# Patient Record
Sex: Female | Born: 1955 | Race: White | Hispanic: No | Marital: Single | Smoking: Former smoker
Health system: Southern US, Community
[De-identification: ages and names within clinical notes are randomized; demographics above are authoritative.]

## PROBLEM LIST (undated history)

## (undated) DIAGNOSIS — J189 Pneumonia, unspecified organism: Secondary | ICD-10-CM

## (undated) DIAGNOSIS — R413 Other amnesia: Secondary | ICD-10-CM

## (undated) DIAGNOSIS — Z8489 Family history of other specified conditions: Secondary | ICD-10-CM

## (undated) DIAGNOSIS — G709 Myoneural disorder, unspecified: Secondary | ICD-10-CM

## (undated) DIAGNOSIS — R030 Elevated blood-pressure reading, without diagnosis of hypertension: Secondary | ICD-10-CM

## (undated) DIAGNOSIS — H5000 Unspecified esotropia: Secondary | ICD-10-CM

## (undated) DIAGNOSIS — T7840XA Allergy, unspecified, initial encounter: Secondary | ICD-10-CM

## (undated) DIAGNOSIS — I7300 Raynaud's syndrome without gangrene: Secondary | ICD-10-CM

## (undated) DIAGNOSIS — R894 Abnormal immunological findings in specimens from other organs, systems and tissues: Secondary | ICD-10-CM

## (undated) DIAGNOSIS — G510 Bell's palsy: Secondary | ICD-10-CM

## (undated) DIAGNOSIS — E785 Hyperlipidemia, unspecified: Secondary | ICD-10-CM

## (undated) DIAGNOSIS — D649 Anemia, unspecified: Secondary | ICD-10-CM

## (undated) DIAGNOSIS — R011 Cardiac murmur, unspecified: Secondary | ICD-10-CM

## (undated) DIAGNOSIS — L4050 Arthropathic psoriasis, unspecified: Secondary | ICD-10-CM

## (undated) DIAGNOSIS — H269 Unspecified cataract: Secondary | ICD-10-CM

## (undated) DIAGNOSIS — M352 Behcet's disease: Secondary | ICD-10-CM

## (undated) DIAGNOSIS — L940 Localized scleroderma [morphea]: Secondary | ICD-10-CM

## (undated) DIAGNOSIS — K219 Gastro-esophageal reflux disease without esophagitis: Secondary | ICD-10-CM

## (undated) DIAGNOSIS — M350C Sjogren syndrome with dental involvement: Secondary | ICD-10-CM

## (undated) DIAGNOSIS — H532 Diplopia: Secondary | ICD-10-CM

## (undated) DIAGNOSIS — M810 Age-related osteoporosis without current pathological fracture: Secondary | ICD-10-CM

## (undated) DIAGNOSIS — I10 Essential (primary) hypertension: Secondary | ICD-10-CM

## (undated) DIAGNOSIS — Z1589 Genetic susceptibility to other disease: Secondary | ICD-10-CM

## (undated) DIAGNOSIS — M797 Fibromyalgia: Secondary | ICD-10-CM

## (undated) DIAGNOSIS — M25569 Pain in unspecified knee: Secondary | ICD-10-CM

## (undated) DIAGNOSIS — F419 Anxiety disorder, unspecified: Secondary | ICD-10-CM

## (undated) DIAGNOSIS — J45909 Unspecified asthma, uncomplicated: Secondary | ICD-10-CM

## (undated) DIAGNOSIS — M25559 Pain in unspecified hip: Secondary | ICD-10-CM

## (undated) DIAGNOSIS — F32A Depression, unspecified: Secondary | ICD-10-CM

## (undated) HISTORY — DX: Anemia, unspecified: D64.9

## (undated) HISTORY — DX: Bell's palsy: G51.0

## (undated) HISTORY — DX: Age-related osteoporosis without current pathological fracture: M81.0

## (undated) HISTORY — DX: Depression, unspecified: F32.A

## (undated) HISTORY — DX: Localized scleroderma (morphea): L94.0

## (undated) HISTORY — DX: Arthropathic psoriasis, unspecified: L40.50

## (undated) HISTORY — DX: Other amnesia: R41.3

## (undated) HISTORY — DX: Behcet's disease: M35.2

## (undated) HISTORY — DX: Raynaud's syndrome without gangrene: I73.00

## (undated) HISTORY — DX: Pain in unspecified knee: M25.569

## (undated) HISTORY — DX: Anxiety disorder, unspecified: F41.9

## (undated) HISTORY — DX: Unspecified esotropia: H50.00

## (undated) HISTORY — DX: Diplopia: H53.2

## (undated) HISTORY — DX: Abnormal immunological findings in specimens from other organs, systems and tissues: R89.4

## (undated) HISTORY — DX: Sjogren syndrome with dental involvement: M35.0C

## (undated) HISTORY — DX: Elevated blood-pressure reading, without diagnosis of hypertension: R03.0

## (undated) HISTORY — DX: Unspecified cataract: H26.9

## (undated) HISTORY — DX: Allergy, unspecified, initial encounter: T78.40XA

## (undated) HISTORY — DX: Unspecified asthma, uncomplicated: J45.909

## (undated) HISTORY — DX: Fibromyalgia: M79.7

## (undated) HISTORY — DX: Myoneural disorder, unspecified: G70.9

## (undated) HISTORY — DX: Genetic susceptibility to other disease: Z15.89

## (undated) HISTORY — DX: Hyperlipidemia, unspecified: E78.5

## (undated) HISTORY — PX: OTHER SURGICAL HISTORY: SHX169

## (undated) HISTORY — DX: Pain in unspecified hip: M25.559

## (undated) HISTORY — DX: Gastro-esophageal reflux disease without esophagitis: K21.9

## (undated) HISTORY — PX: CYST REMOVAL HAND: SHX6279

---

## 1978-01-11 HISTORY — PX: SALPINGECTOMY: SHX328

## 1989-01-11 DIAGNOSIS — E0500 Thyrotoxicosis with diffuse goiter without thyrotoxic crisis or storm: Secondary | ICD-10-CM

## 1989-01-11 HISTORY — DX: Thyrotoxicosis with diffuse goiter without thyrotoxic crisis or storm: E05.00

## 2019-12-19 ENCOUNTER — Encounter: Payer: Self-pay | Admitting: Family Medicine

## 2020-01-01 ENCOUNTER — Encounter: Payer: Self-pay | Admitting: Family Medicine

## 2020-01-01 ENCOUNTER — Ambulatory Visit (INDEPENDENT_AMBULATORY_CARE_PROVIDER_SITE_OTHER): Payer: 59 | Admitting: Family Medicine

## 2020-01-01 ENCOUNTER — Ambulatory Visit: Payer: Self-pay | Admitting: Family Medicine

## 2020-01-01 ENCOUNTER — Other Ambulatory Visit: Payer: Self-pay

## 2020-01-01 VITALS — BP 144/92 | HR 104 | Ht 65.0 in | Wt 175.6 lb

## 2020-01-01 DIAGNOSIS — Z1211 Encounter for screening for malignant neoplasm of colon: Secondary | ICD-10-CM

## 2020-01-01 DIAGNOSIS — E890 Postprocedural hypothyroidism: Secondary | ICD-10-CM

## 2020-01-01 DIAGNOSIS — M3500 Sicca syndrome, unspecified: Secondary | ICD-10-CM

## 2020-01-01 DIAGNOSIS — Z1159 Encounter for screening for other viral diseases: Secondary | ICD-10-CM

## 2020-01-01 DIAGNOSIS — R030 Elevated blood-pressure reading, without diagnosis of hypertension: Secondary | ICD-10-CM

## 2020-01-01 DIAGNOSIS — Z1589 Genetic susceptibility to other disease: Secondary | ICD-10-CM

## 2020-01-01 DIAGNOSIS — Z1231 Encounter for screening mammogram for malignant neoplasm of breast: Secondary | ICD-10-CM

## 2020-01-01 DIAGNOSIS — E0500 Thyrotoxicosis with diffuse goiter without thyrotoxic crisis or storm: Secondary | ICD-10-CM

## 2020-01-01 DIAGNOSIS — R197 Diarrhea, unspecified: Secondary | ICD-10-CM

## 2020-01-01 DIAGNOSIS — E039 Hypothyroidism, unspecified: Secondary | ICD-10-CM

## 2020-01-01 DIAGNOSIS — Z6829 Body mass index (BMI) 29.0-29.9, adult: Secondary | ICD-10-CM

## 2020-01-01 DIAGNOSIS — Z0000 Encounter for general adult medical examination without abnormal findings: Secondary | ICD-10-CM

## 2020-01-01 DIAGNOSIS — R4189 Other symptoms and signs involving cognitive functions and awareness: Secondary | ICD-10-CM

## 2020-01-01 MED ORDER — RESTASIS 0.05 % OP EMUL: 1.0000 [drp] | Freq: Two times a day (BID) | OPHTHALMIC | 3 refills | Status: DC

## 2020-01-01 MED ORDER — DULOXETINE HCL 30 MG PO CPEP: 30.0000 mg | ORAL_CAPSULE | Freq: Every day | ORAL | 0 refills | Status: DC

## 2020-01-01 NOTE — Patient Instructions (Addendum)
It was a pleasure to see you today!  Thank you for choosing [facility] for your primary care.  Jodi Gilmore was seen for establishing care, autoimmune diseases, Graves' disease, diarrhea) brain fog.   Our plans for today were:  In order to further assess your brain fog and thyroid concerns, we will measure your TSH and free T4.  I will also complete a metabolic panel as well as a CBC to investigate for any potential forms of anemia or signs of infection, this could also show any potential changes in your ability to absorb nutrients and therefore clues as to what may be causing your diarrhea.   I will contact you with any abnormal lab values to discuss further treatment.  I have submitted referrals for rheumatology (to discuss your rheumatologic conditions), ophthalmology (for eye exam)and gastroenterology to set up your colonoscopy/assess your abdominal issues.  We can discuss setting up referral for endocrinology at your next appointment in 2 weeks to allow for these referrals to start the process.  To keep you healthy, please keep in mind the following health maintenance items that you are due for:    1. Tetanus vaccination booster 2. Pap smear   You should return to our clinic in 2 weeks for Pap smear and follow-up on joint pain as well as check cholesterol.   Best Wishes,   Dr. Neita Garnet   Psychiatry Resource List (Adults and Children) Most of these providers will take Medicaid. please consult your insurance for a complete and updated list of available providers. When calling to make an appointment have your insurance information available to confirm you are covered.   [facility]:Psychiatry and Counseling [address] [place], [place] [address] [phone]  [facility]  [address] [place], [place] [phone] Crisis [phone]   [facility]:   [facility]: [address]      [phone]   [facility] Ace: [address] [place]. [place],        [phone] [place]: [address],    [phone] Kathryne Sharper: [address],                   [phone] Children: [facility] [address]         [phone]   [facility]  (Psychiatry only; Adults /children 12 and over, will take Medicaid)  [address] Laurell Josephs [address], [place], [place] [address]       [phone]   [facility] (Psychiatry & counseling ; adults & children ; will take Medicaid [address]  [place] [address]   Go on-line to complete referral ( [email or web address].[email or web address]/en/make-[email or web address] [phone]   (Spanish therapist)  [facility]  Psychiatry & counseling; Adults and children;  Call Registration prior to scheduling an appointment [phone] [address]    [place], [place] [address]    [phone]  [facility] (Psychiatry & counseling; adults & children; Medicare no Medicaid)  [address]   [place], [place]  [address]      [phone]    [facility] (up to age 3)  Psychiatry & counseling ,will take Medicaid, must do counseling to receive psychiatry services  [address] [place] [address]        [phone]  [facility] (Psychiatry & counseling; adults & children; will take Medicaid)  Will need a referral from provider [address],  [place], [place]  [phone]   [facility] --- Walk-In Mon-Friday 8am-3pm ( will take Medicaid, Psychiatry, Adults & children,  [address], [place], [place]   [phone]   [facility], Walk-in M-F 8am-12pm and 1pm -3pm   (Counseling, Psychiatry, will take Medicaid, adults & children)  [address] [place], [place]  [phone]

## 2020-01-01 NOTE — Progress Notes (Addendum)
SUBJECTIVE:   CHIEF COMPLAINT / HPI: establishing care   Friend Jessica present for visit in order to help patient take notes  History of Graves' disease,s/p RAI  Patient reports that she underwent RAI in 1991.  She has since been on Synthroid and has required frequent adjusting of her dosage due to her thyroid frequently being "out of whack".  She reports the she has been experiencing fluctuations in her blood pressure which is normally the case on her thyroid is not under control.  Patient states that she recently moved from [place] and has not established care since being here in [place] until today.  She reports adherence with her thyroid medication.  Patient request to have full "thyroid panel".   Abdominal Discomfort  Patient reports having moderate abdominal pain associated with up to 20 episodes of loose stools per day. She satates that she has not had any fever or known contacts with similar symtpoms. She reports having some episodes of blood in her bowel movemtns that has ranged from scant amount to moderate amount mixed in with feces. She has not had a colonsocopy in decades. She reports hx of GERD.   Brain Fog Patient reports experiencing brain fog and having difficulty with word finding and remembering things. She reports that her sister and mother both had early onset dementia and her sister is currently on a memory unit for alzheimer's. She is concerned if she may be in the early stages and would also like to see if it is connected to her thyroid disease.   Past medical history Patient reports history of Graves' disease with RAI in 1991 History of Sjogren's with sicca syndrome Scleroderma Psoriatic arthritis Raynaud's HLA-B27 gene  Healthcare maintenance Patient is due for colonoscopy, has not had one in 30 years Patient due for mammogram has not had one in 20 years Has not had Pap smear in 20+ years Has not had bone density scan in 20 years Unknown tetanus  vaccination Head potential pneumonia vaccine in the late 1990s Has not had shingles vaccine   Remainder of items patient wishes to discuss at future appointments: Hip and knee pain Vision problems, difficulty driving Concern for additional rheumatologic diagnoses Bipolar Disorder  PERTINENT  PMH / PSH:  Patient is out of work and recently lost parents within 3 months of one another  Recently moved here from [facility]    OBJECTIVE:   BP (!) 144/92   Pulse (!) 104   Ht _0  (1.651 m)   Wt 175 lb 9.6 oz (79.7 kg)   SpO2 95%   BMI 29.22 kg/m   General: female appearing stated age in no acute distress HEENT: MMM, no oral lesions noted,Neck non-tender without lymphadenopathy, patient is status post RAI Cardio: Normal S1 and S2, no S3 or S4. Rhythm is regular. No murmurs or rubs.  Bilateral radial pulses palpable Pulm: Clear to auscultation bilaterally, no crackles, wheezing, or diminished breath sounds. Normal respiratory effort, stable on room air Abdomen: Bowel sounds normal. Abdomen soft with tenderness Extremities: Trace peripheral edema. Warm/ well perfused.  Neuro: pt alert and oriented x4, extraocular muscles intact, 5/5 strength in bilateral upper and lower extremities  ASSESSMENT/PLAN:   Hypothyroidism (acquired) Patient has undergone RAI and has since been on Synthroid since 1991. Free T4, T3, TSH collected today We will adjust Synthroid dose based on results Discussed referral to endocrinology once results and patient has had time to respond to change dose of Synthroid  Diarrhea in adult patient  Given nature of diarrhea and history of bloody bowel movements, patient referred to gastroenterology for colonoscopy as this has not been done in 30 years. List likely on differential is infectious etiology given the time course, patient could also have some form of malabsorption contributing to symptoms given her extensive hx of comorbidities. Patient also reports taking several  NSAIDS and Tylenol  Pills on regular baseis so some concern that she may have peptic ulcer disease. - ordered GI referral for colonoscopy  - CMP, CBC  To check for dehydration or any signs of inflammation/infection.  - check Hgb A1c for any DM related complication if present  - prescribed cymbalta if case patient has underlying IBS as contributor to symptoms   Brain fog Most likely related to thyroid abnormalities given hx. Patient may also be showing signs of memory iloss gven her family hx of early onset dementia. Will establish baseline for lab values as mentioned above. Patient would benefit from mental status assessment at follow up appts   Healthcare maintenance GI referral for colonoscopy  Referred to ophthalmology for vision changes  Rheum referral per patien t pref given hx of several rheum disease, will need patient medical records for referral process mammogram ordered  Patient will follow up for pap smear  Hep c screening completed today         Eulis Foster, MD [place]

## 2020-01-02 ENCOUNTER — Other Ambulatory Visit: Payer: Self-pay | Admitting: Family Medicine

## 2020-01-02 LAB — COMPREHENSIVE METABOLIC PANEL
ALT: 42 IU/L — ABNORMAL HIGH (ref 0–32)
AST: 33 IU/L (ref 0–40)
Albumin/Globulin Ratio: 1.9 (ref 1.2–2.2)
Albumin: 4.9 g/dL — ABNORMAL HIGH (ref 3.8–4.8)
Alkaline Phosphatase: 128 IU/L — ABNORMAL HIGH (ref 44–121)
BUN/Creatinine Ratio: 17 (ref 12–28)
BUN: 17 mg/dL (ref 8–27)
Bilirubin Total: 0.2 mg/dL (ref 0.0–1.2)
CO2: 22 mmol/L (ref 20–29)
Calcium: 10.2 mg/dL (ref 8.7–10.3)
Chloride: 98 mmol/L (ref 96–106)
Creatinine, Ser: 1 mg/dL (ref 0.57–1.00)
GFR calc Af Amer: 69 mL/min/{1.73_m2} (ref >59)
GFR calc non Af Amer: 60 mL/min/{1.73_m2} (ref >59)
Globulin, Total: 2.6 g/dL (ref 1.5–4.5)
Glucose: 87 mg/dL (ref 65–99)
Potassium: 4.2 mmol/L (ref 3.5–5.2)
Sodium: 138 mmol/L (ref 134–144)
Total Protein: 7.5 g/dL (ref 6.0–8.5)

## 2020-01-02 LAB — CBC WITH DIFFERENTIAL/PLATELET
Basophils Absolute: 0.1 10*3/uL (ref 0.0–0.2)
Basos: 1 %
EOS (ABSOLUTE): 0.3 10*3/uL (ref 0.0–0.4)
Eos: 4 %
Hematocrit: 42.3 % (ref 34.0–46.6)
Hemoglobin: 14.4 g/dL (ref 11.1–15.9)
Immature Grans (Abs): 0 10*3/uL (ref 0.0–0.1)
Immature Granulocytes: 0 %
Lymphocytes Absolute: 1.8 10*3/uL (ref 0.7–3.1)
Lymphs: 18 %
MCH: 30.2 pg (ref 26.6–33.0)
MCHC: 34 g/dL (ref 31.5–35.7)
MCV: 89 fL (ref 79–97)
Monocytes Absolute: 0.7 10*3/uL (ref 0.1–0.9)
Monocytes: 7 %
Neutrophils Absolute: 6.8 10*3/uL (ref 1.4–7.0)
Neutrophils: 70 %
Platelets: 354 10*3/uL (ref 150–450)
RBC: 4.77 x10E6/uL (ref 3.77–5.28)
RDW: 12.8 % (ref 11.7–15.4)
WBC: 9.7 10*3/uL (ref 3.4–10.8)

## 2020-01-02 LAB — HEMOGLOBIN A1C
Est. average glucose Bld gHb Est-mCnc: 103 mg/dL
Hgb A1c MFr Bld: 5.2 % (ref 4.8–5.6)

## 2020-01-02 LAB — TSH: TSH: 14.3 u[IU]/mL — ABNORMAL HIGH (ref 0.450–4.500)

## 2020-01-02 LAB — HEPATITIS C ANTIBODY: Hep C Virus Ab: <0.1 s/co ratio (ref 0.0–0.9)

## 2020-01-02 LAB — VITAMIN B12: Vitamin B-12: >2000 pg/mL — ABNORMAL HIGH (ref 232–1245)

## 2020-01-02 LAB — T3, FREE: T3, Free: 2.3 pg/mL (ref 2.0–4.4)

## 2020-01-02 LAB — T4, FREE: Free T4: 1.26 ng/dL (ref 0.82–1.77)

## 2020-01-02 LAB — HIV ANTIBODY (ROUTINE TESTING W REFLEX): HIV Screen 4th Generation wRfx: NONREACTIVE

## 2020-01-02 MED ORDER — LEVOTHYROXINE SODIUM 137 MCG PO TABS: 125.0000 ug | ORAL_TABLET | Freq: Every day | ORAL | 0 refills | Status: DC

## 2020-01-02 NOTE — Progress Notes (Signed)
Increased synthroid dose to daily from 

## 2020-01-03 ENCOUNTER — Telehealth: Payer: Self-pay

## 2020-01-03 ENCOUNTER — Emergency Department (HOSPITAL_COMMUNITY): Payer: 59

## 2020-01-03 ENCOUNTER — Ambulatory Visit (HOSPITAL_COMMUNITY)
Admission: EM | Admit: 2020-01-03 | Discharge: 2020-01-03 | Disposition: A | Discharge status: Home / Self Care | Payer: 59

## 2020-01-03 ENCOUNTER — Emergency Department (HOSPITAL_COMMUNITY)
Admission: EM | Admit: 2020-01-03 | Discharge: 2020-01-04 | Disposition: A | Discharge status: Home / Self Care | Payer: 59 | Attending: Emergency Medicine | Admitting: Emergency Medicine

## 2020-01-03 ENCOUNTER — Encounter (HOSPITAL_COMMUNITY): Payer: Self-pay

## 2020-01-03 ENCOUNTER — Other Ambulatory Visit: Payer: Self-pay

## 2020-01-03 DIAGNOSIS — Z91040 Latex allergy status: Secondary | ICD-10-CM | POA: Insufficient documentation

## 2020-01-03 DIAGNOSIS — R000 Tachycardia, unspecified: Secondary | ICD-10-CM | POA: Diagnosis not present

## 2020-01-03 DIAGNOSIS — R519 Headache, unspecified: Secondary | ICD-10-CM | POA: Diagnosis not present

## 2020-01-03 DIAGNOSIS — R0789 Other chest pain: Secondary | ICD-10-CM | POA: Insufficient documentation

## 2020-01-03 DIAGNOSIS — R079 Chest pain, unspecified: Secondary | ICD-10-CM

## 2020-01-03 DIAGNOSIS — Z0000 Encounter for general adult medical examination without abnormal findings: Secondary | ICD-10-CM | POA: Insufficient documentation

## 2020-01-03 DIAGNOSIS — R197 Diarrhea, unspecified: Secondary | ICD-10-CM | POA: Insufficient documentation

## 2020-01-03 DIAGNOSIS — I10 Essential (primary) hypertension: Secondary | ICD-10-CM | POA: Diagnosis not present

## 2020-01-03 DIAGNOSIS — Z87891 Personal history of nicotine dependence: Secondary | ICD-10-CM | POA: Diagnosis not present

## 2020-01-03 DIAGNOSIS — R42 Dizziness and giddiness: Secondary | ICD-10-CM | POA: Insufficient documentation

## 2020-01-03 DIAGNOSIS — E039 Hypothyroidism, unspecified: Secondary | ICD-10-CM | POA: Insufficient documentation

## 2020-01-03 DIAGNOSIS — R4189 Other symptoms and signs involving cognitive functions and awareness: Secondary | ICD-10-CM | POA: Insufficient documentation

## 2020-01-03 LAB — TROPONIN I (HIGH SENSITIVITY)
Troponin I (High Sensitivity): 5 ng/L (ref <18)
Troponin I (High Sensitivity): 5 ng/L (ref <18)

## 2020-01-03 LAB — CBC
HCT: 40.3 % (ref 36.0–46.0)
Hemoglobin: 13.9 g/dL (ref 12.0–15.0)
MCH: 30.8 pg (ref 26.0–34.0)
MCHC: 34.5 g/dL (ref 30.0–36.0)
MCV: 89.2 fL (ref 80.0–100.0)
Platelets: 363 10*3/uL (ref 150–400)
RBC: 4.52 MIL/uL (ref 3.87–5.11)
RDW: 12.5 % (ref 11.5–15.5)
WBC: 7.6 10*3/uL (ref 4.0–10.5)
nRBC: 0 % (ref 0.0–0.2)

## 2020-01-03 LAB — BASIC METABOLIC PANEL
Anion gap: 12 (ref 5–15)
BUN: 11 mg/dL (ref 8–23)
CO2: 24 mmol/L (ref 22–32)
Calcium: 10.1 mg/dL (ref 8.9–10.3)
Chloride: 100 mmol/L (ref 98–111)
Creatinine, Ser: 0.8 mg/dL (ref 0.44–1.00)
GFR, Estimated: >60 mL/min (ref >60)
Glucose, Bld: 115 mg/dL — ABNORMAL HIGH (ref 70–99)
Potassium: 3.9 mmol/L (ref 3.5–5.1)
Sodium: 136 mmol/L (ref 135–145)

## 2020-01-03 IMAGING — DX DG CHEST 2V
2 series · 2 of 2 positions shown · non-contrast
Comparison: None.

CLINICAL DATA: Shortness of breath.

EXAM:
CHEST - 2 VIEW

[chest pa]
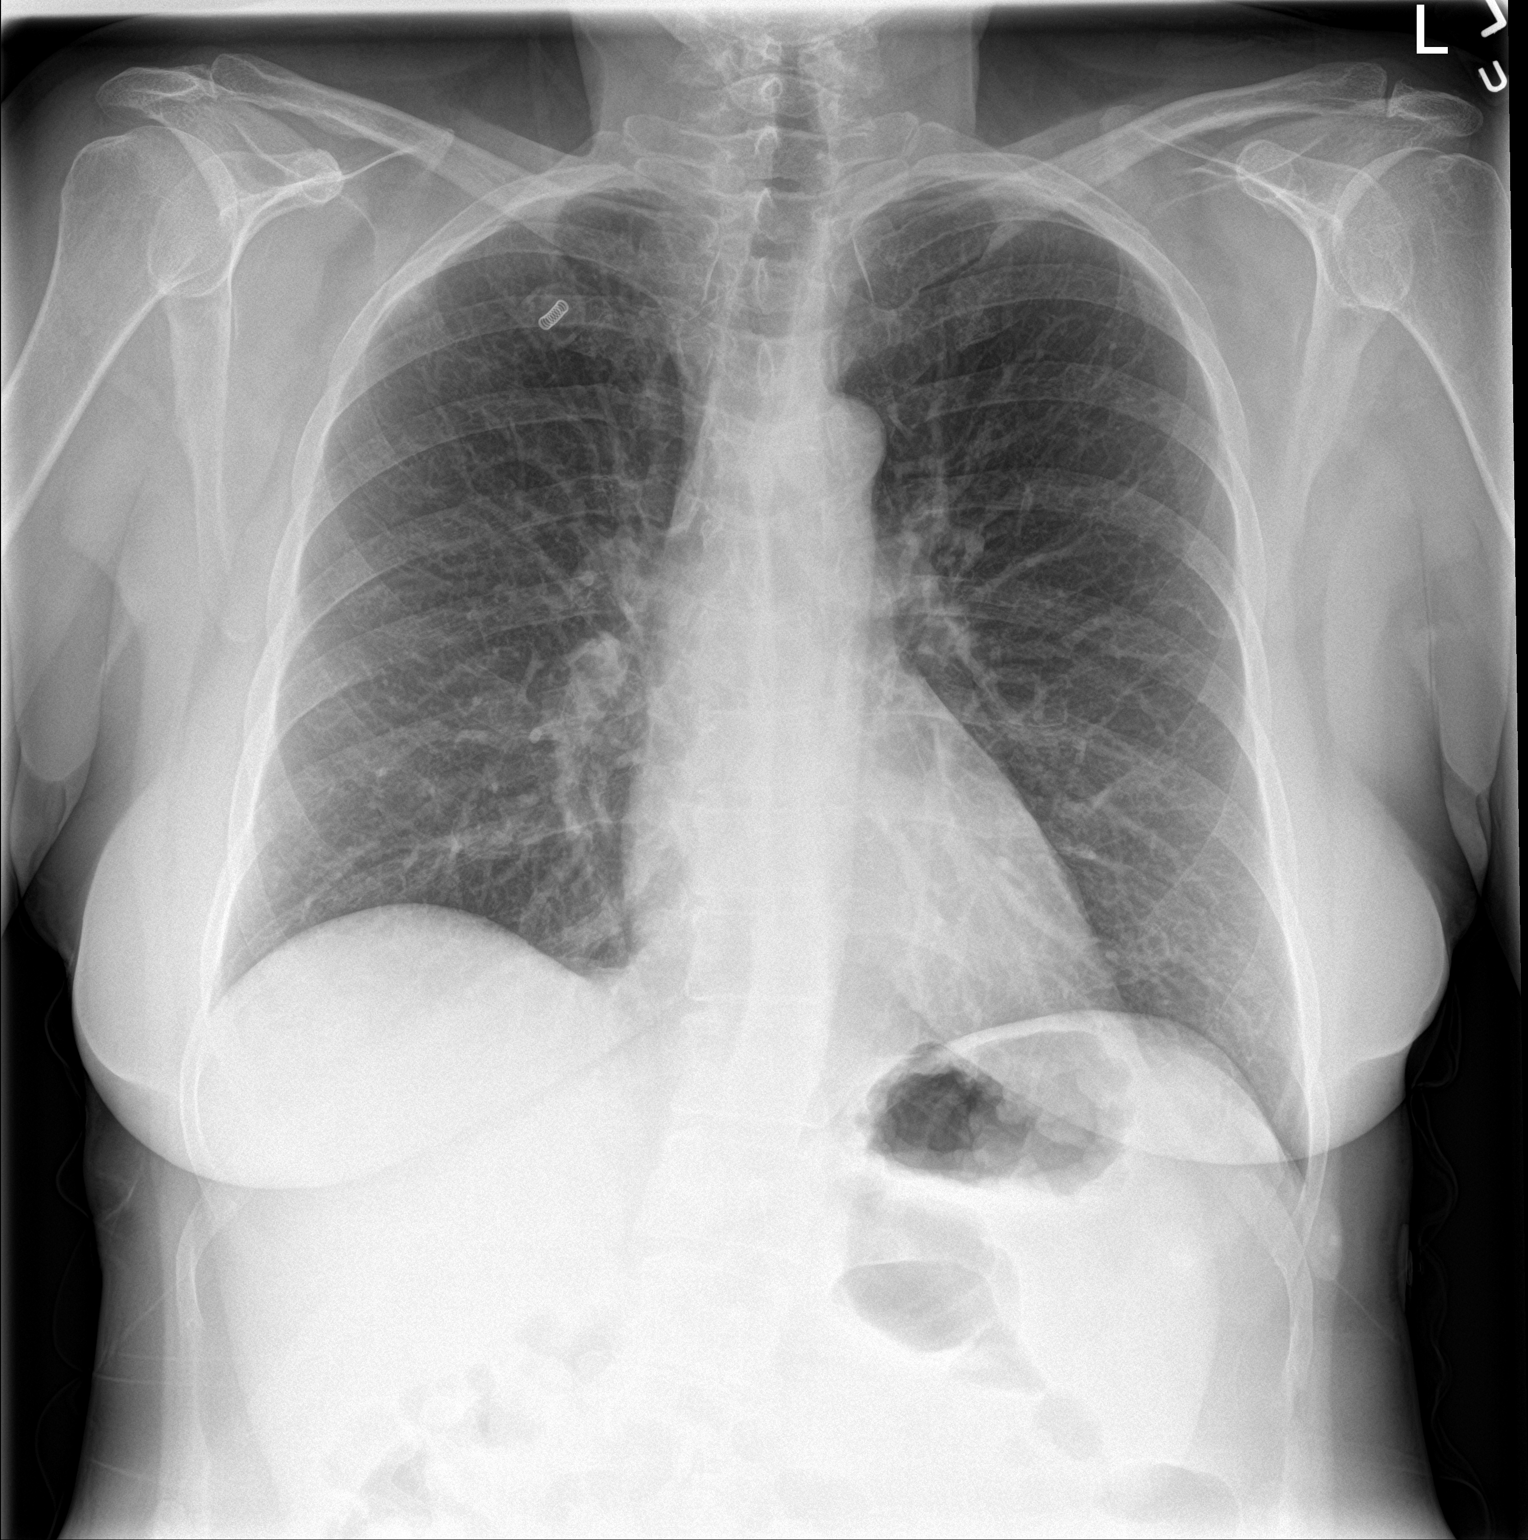

[chest lat]
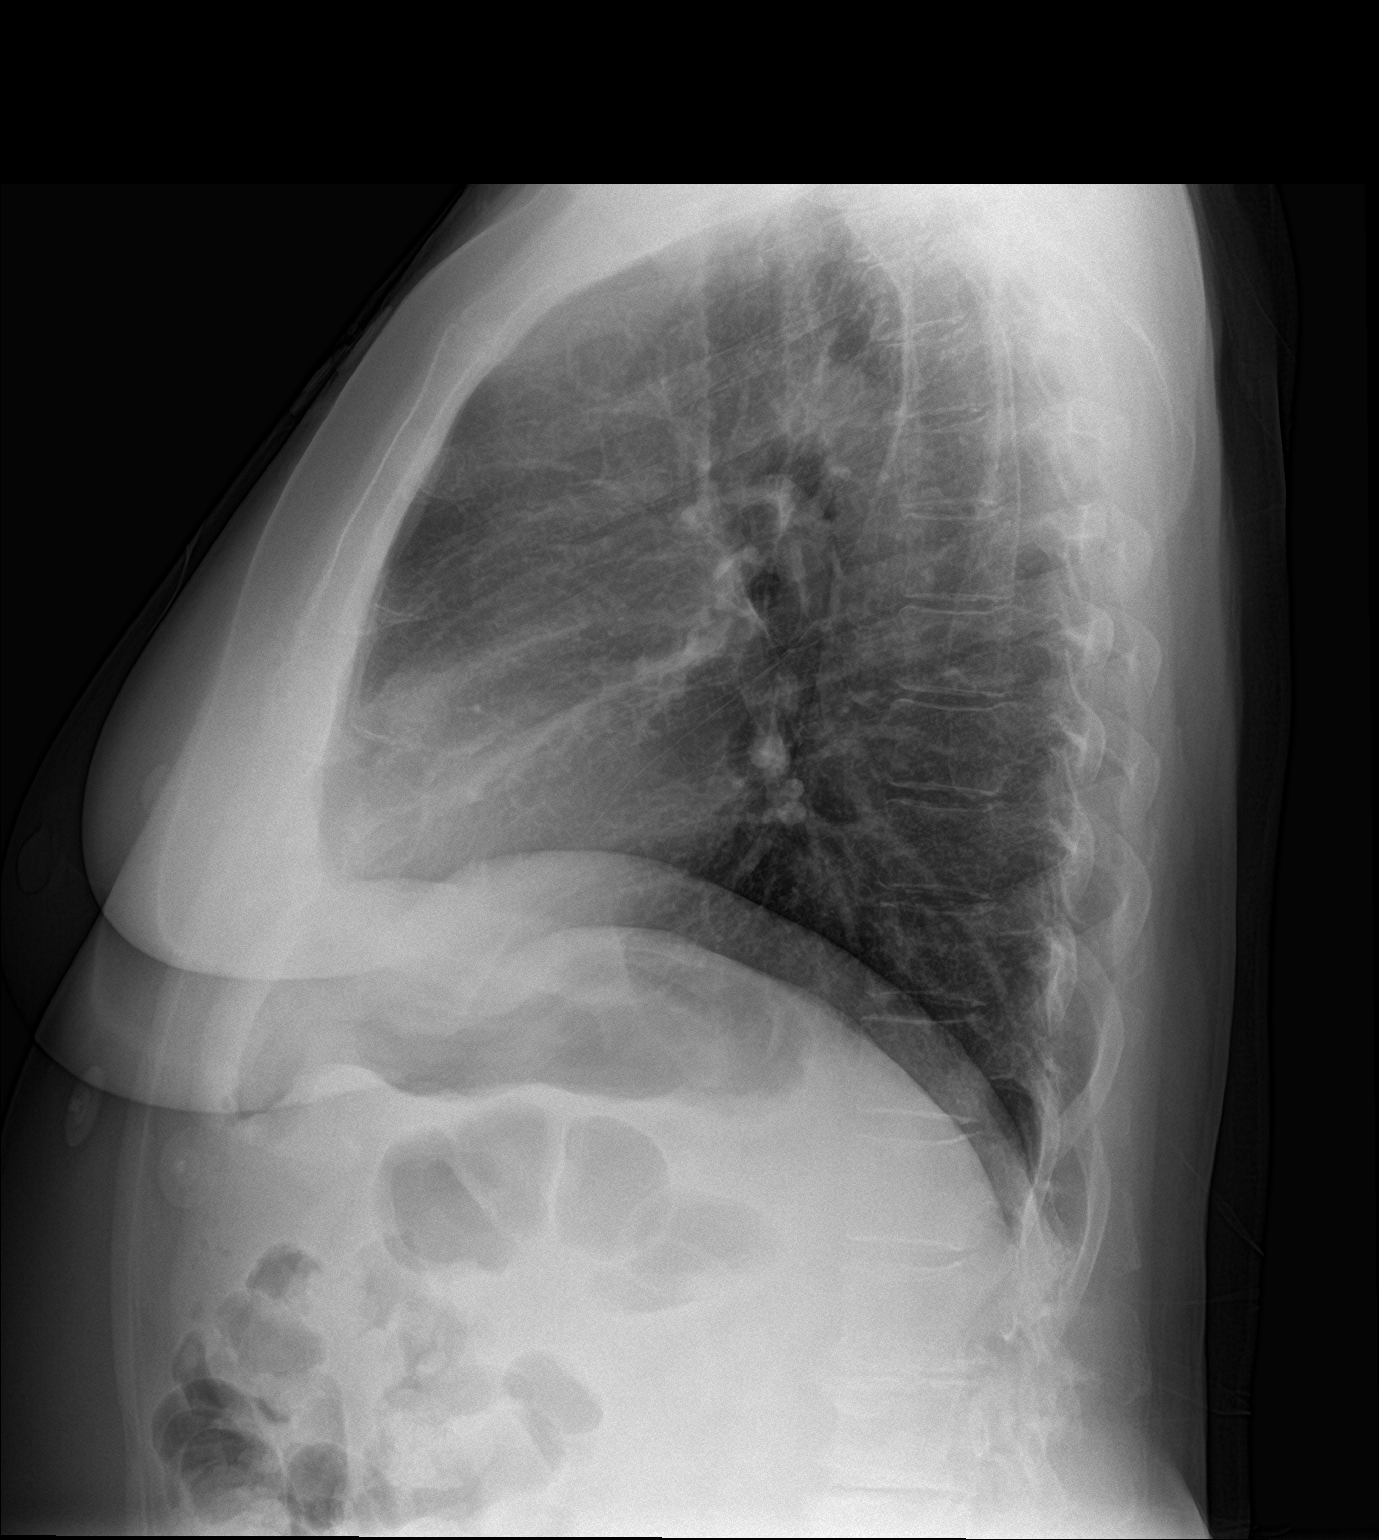

[2 of 2 positions shown; findings below may reference images not displayed]

FINDINGS: The heart size and mediastinal contours are within normal limits.
The a small radiopaque stent is seen overlying the right apex on the
frontal view. Both lungs are clear. The visualized skeletal
structures are unremarkable.
IMPRESSION: No active cardiopulmonary disease.

## 2020-01-03 IMAGING — CT CT HEAD W/O CM
4 series · 15 of 47 positions shown, 17 images · non-contrast
Comparison: None.

CLINICAL DATA: Worsening headache.

EXAM:
CT HEAD WITHOUT CONTRAST
TECHNIQUE: Contiguous axial images were obtained from the base of the skull
through the vertex without intravenous contrast.

[Series 3: head without · axial · non-contrast · 0.46mm/px · z∈[-136,-16]mm · 6 of 34 slices shown, 8 images]
[im 5/34  brain]
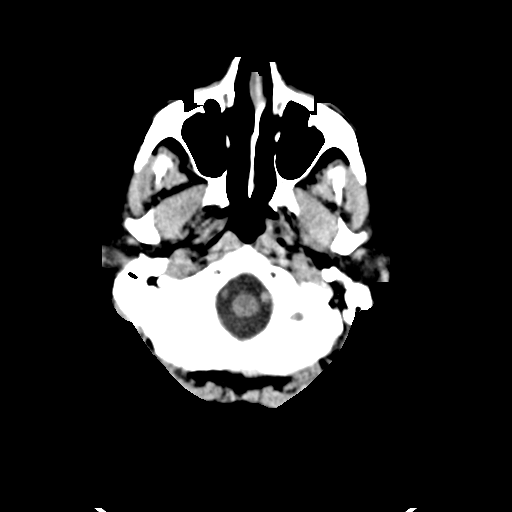
[im 5/34  bone]
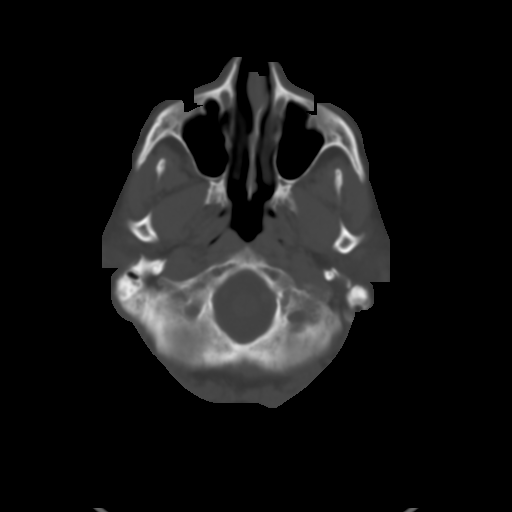
[im 10/34  brain]
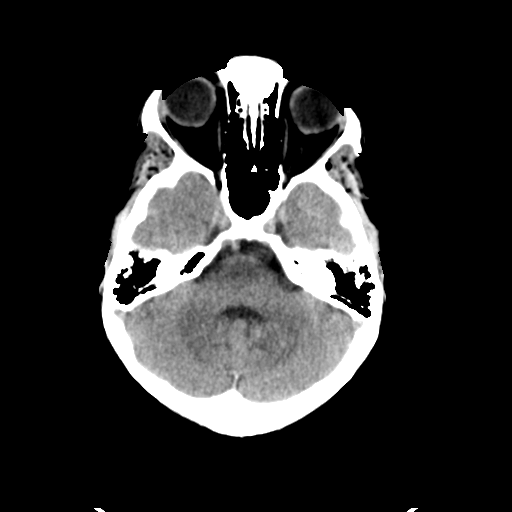
[im 15/34  brain]
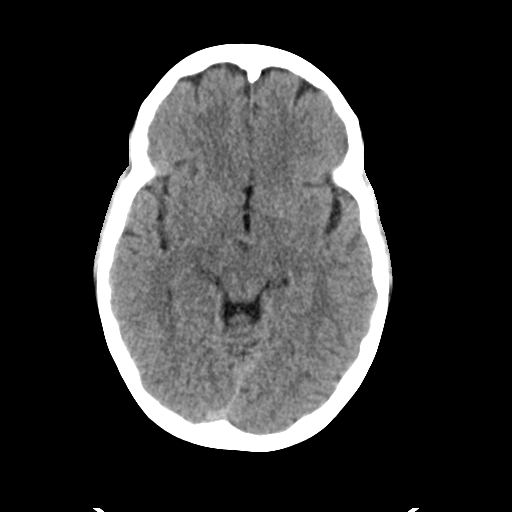
[im 19/34  brain]
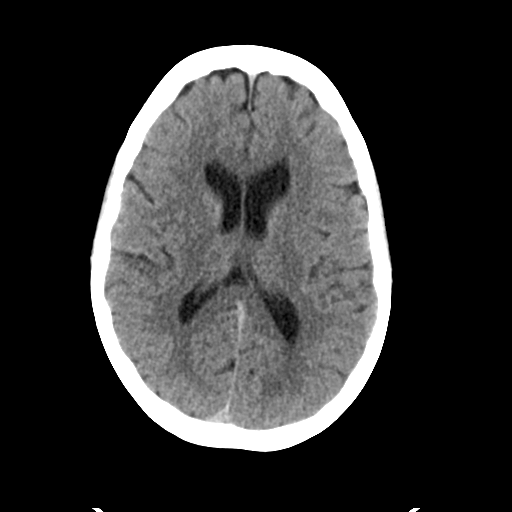
[im 24/34  brain]
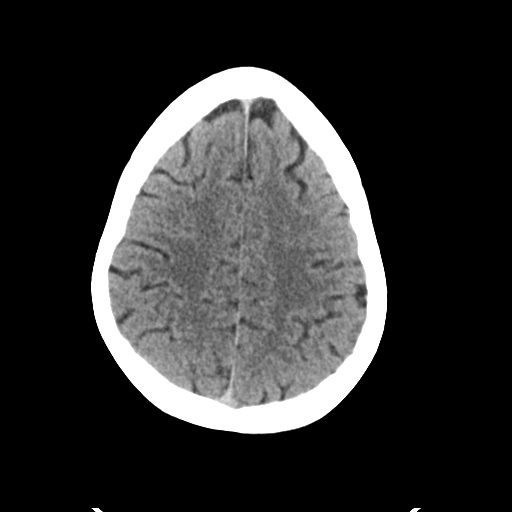
[im 24/34  bone]
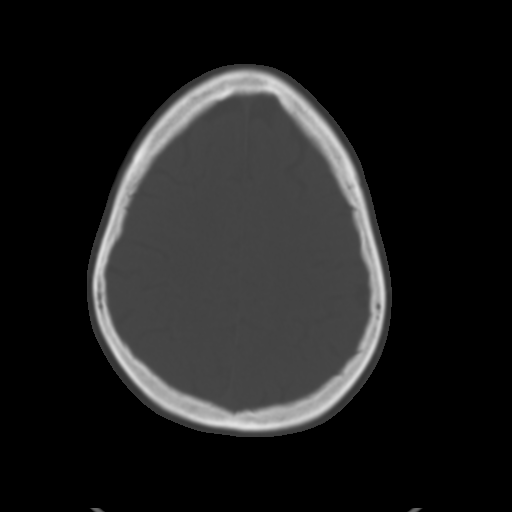
[im 29/34  brain]
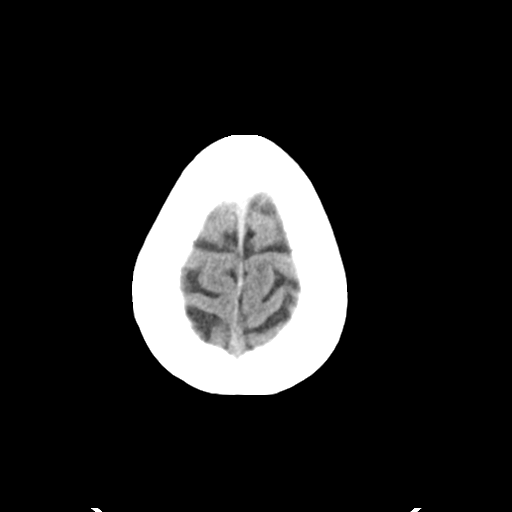

[Series 4: head bone · axial · 0.46mm/px · z∈[-140,-100]mm · 3 of 86 slices shown]
[im 9/86  bone]
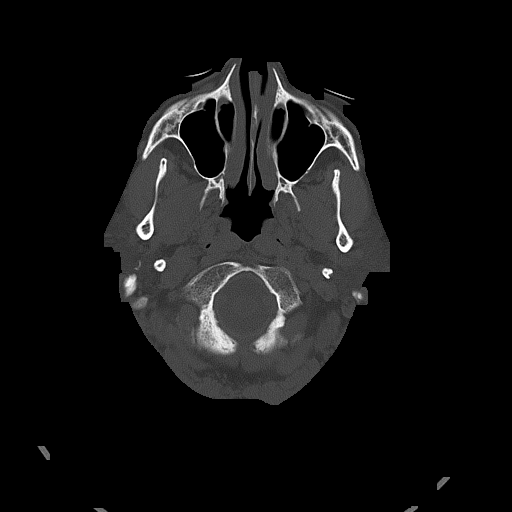
[im 17/86  bone]
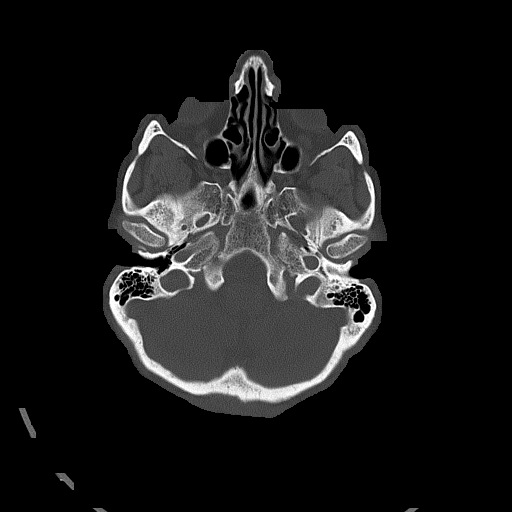
[im 29/86  bone]
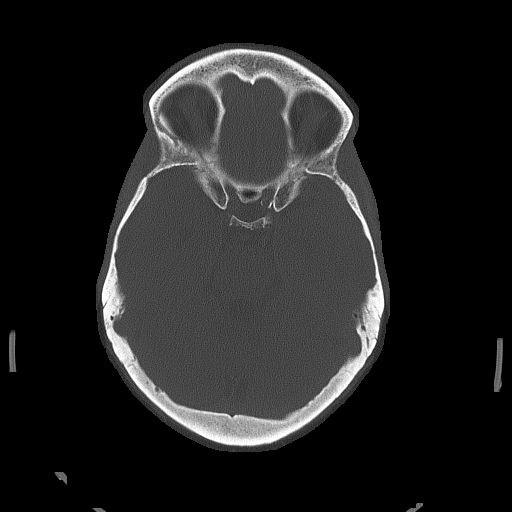

[Series 5: head without cor · coronal · non-contrast · 0.33mm/px · 3 of 71 slices shown]
[im 24/71  brain]
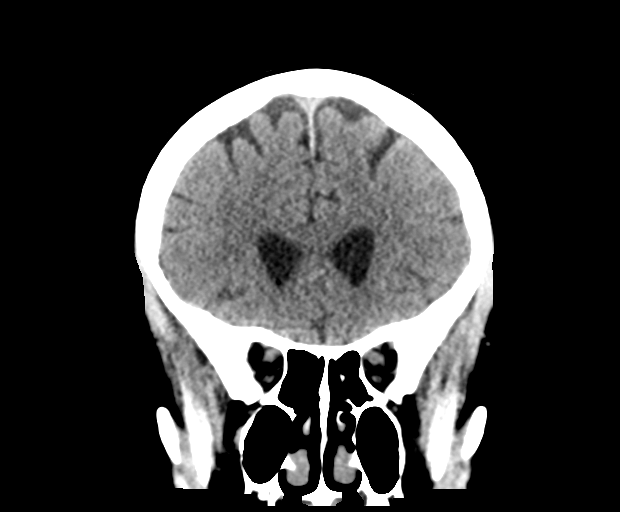
[im 32/71  brain]
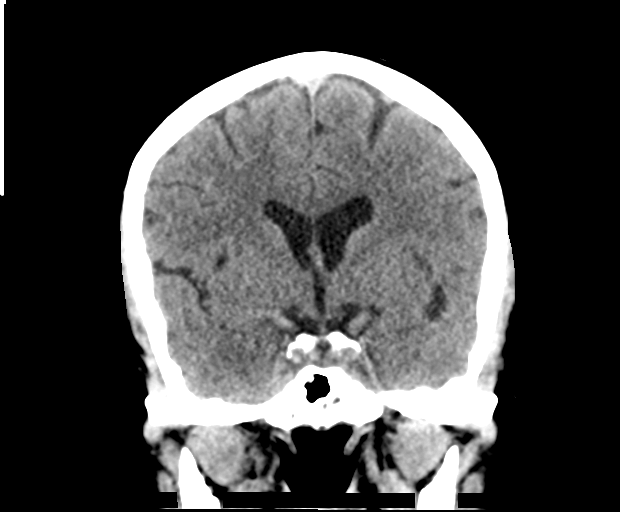
[im 39/71  brain]
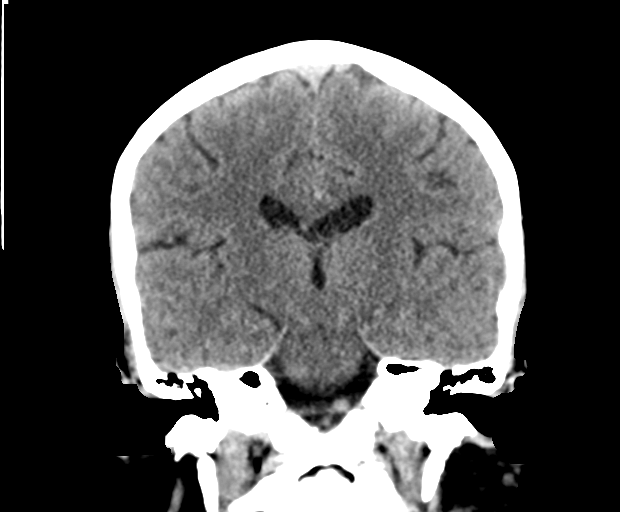

[Series 6: head without sag · sagittal · non-contrast · 0.33mm/px · 3 of 67 slices shown]
[im 23/67  brain]
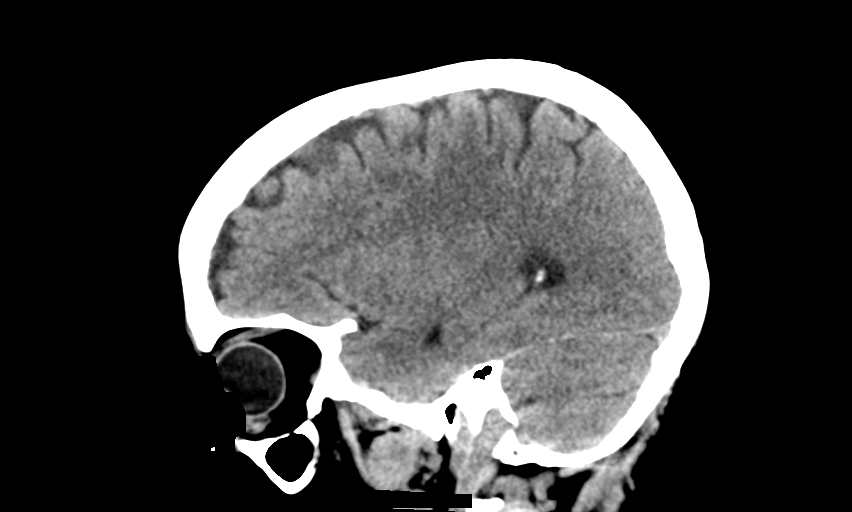
[im 34/67  brain]
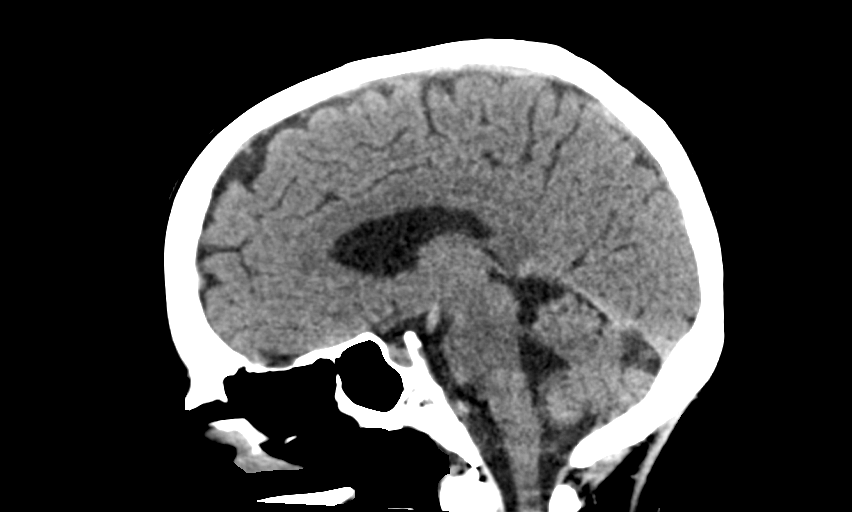
[im 45/67  brain]
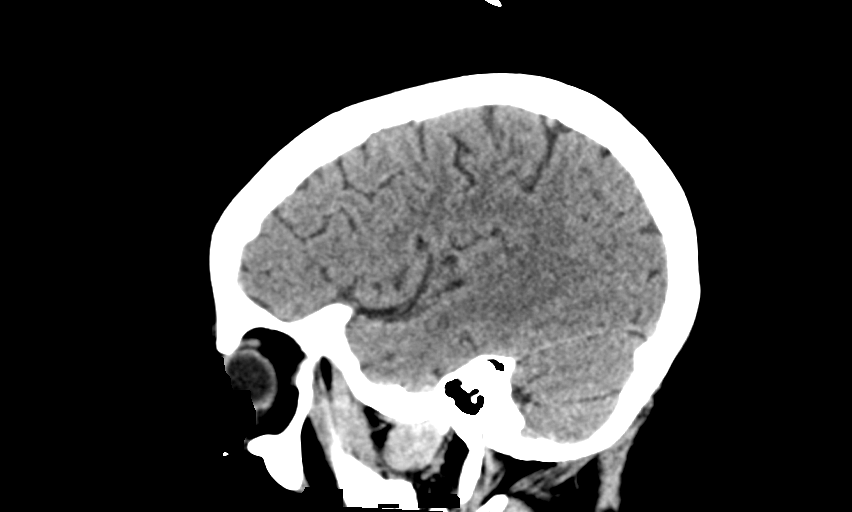

[15 of 47 positions shown; findings below may reference images not displayed]

FINDINGS: Brain: No evidence of acute infarction, hemorrhage, hydrocephalus,
extra-axial collection or mass lesion/mass effect.

Vascular: No hyperdense vessel or unexpected calcification.

Skull: Normal. Negative for fracture or focal lesion.

Sinuses/Orbits: No acute finding.

Other: None.
IMPRESSION: No acute intracranial pathology.

## 2020-01-03 MED ORDER — KETOROLAC TROMETHAMINE 30 MG/ML IJ SOLN
30.0000 mg | Freq: Once | INTRAMUSCULAR | Status: AC
  Administered 2020-01-04: 30 mg via INTRAVENOUS
  Filled 2020-01-03: qty 1

## 2020-01-03 NOTE — Assessment & Plan Note (Signed)
Given nature of diarrhea and history of bloody bowel movements, patient referred to gastroenterology for colonoscopy as this has not been done in 30 years. List likely on differential is infectious etiology given the time course, patient could also have some form of malabsorption contributing to symptoms given her extensive hx of comorbidities. Patient also reports taking several NSAIDS and Tylenol  Pills on regular baseis so some concern that she may have peptic ulcer disease. - ordered GI referral for colonoscopy  - CMP, CBC  To check for dehydration or any signs of inflammation/infection.  - check Hgb A1c for any DM related complication if present  - prescribed cymbalta if case patient has underlying IBS as contributor to symptoms

## 2020-01-03 NOTE — ED Notes (Signed)
Handoff given to Jazmine RN

## 2020-01-03 NOTE — ED Triage Notes (Addendum)
Pt In with c/o HTN. States that her BP was 169/90. Also c/o headache and dizziness. States she is seeing "spots" also c/o chest pressure  Pt took mylanta with no relief

## 2020-01-03 NOTE — ED Notes (Signed)
Pia Mau, PA into triage to assess pt status. Pt advised to be seen  In ED for further evaluation

## 2020-01-03 NOTE — Assessment & Plan Note (Signed)
Most likely related to thyroid abnormalities given hx. Patient may also be showing signs of memory iloss gven her family hx of early onset dementia. Will establish baseline for lab values as mentioned above. Patient would benefit from mental status assessment at follow up appts

## 2020-01-03 NOTE — Assessment & Plan Note (Signed)
GI referral for colonoscopy  Referred to ophthalmology for vision changes  Rheum referral per patien t pref given hx of several rheum disease, will need patient medical records for referral process mammogram ordered  Patient will follow up for pap smear  Hep c screening completed today

## 2020-01-03 NOTE — Telephone Encounter (Signed)
Patient calls nurse line requesting to speak with provider regarding possibly starting lisinopril 2.5 mg.   Started cymbalta yesterday. Reports nausea and headache. Headache onset yesterday around one. Patient does report history of acid reflux/ GERD symptoms. Patient states that she has had "chest discomfort" in the past but always goes away. Patient reports taking prilosec for these symptoms, with some relief. Patient reports that chest feels better when she is sitting up.   Pulse Ox: 95%, HR:96, BP: 168/117, 164/96  Advised patient that due to severe headache and elevated BP readings that she should be evaluated today.   Advised UC/ED follow up. Patient verbalized understanding.   Veronda Prude, RN

## 2020-01-03 NOTE — ED Provider Notes (Signed)
TIME SEEN: 11:18 PM  CHIEF COMPLAINT: Chest pain, headache, hypertension  HPI: Patient is a 64 year old female with history of Graves' disease presents to the emergency department with complaints of diffuse chest pressure, diffuse headache that she described as throbbing and hypertension.  Blood pressure was 173/114 at home.  States blood pressure was 176/88 in urgent care and they instructed her to come to the emergency department.  She told them that she was having dizziness and seeing spots.  She denies any vision changes at this time to me.  No numbness or weakness.  No fevers, cough, shortness of breath, nausea, vomiting, diaphoresis.  States she did take Sudafed today and she just started Cymbalta.  She is not on medications for her blood pressure.  She is wondering if these medications could be causing her high blood pressure.  She does have a history of chronic headaches.  No history of PE, DVT, exogenous estrogen use, recent fractures, surgery, trauma, hospitalization, prolonged travel or other immobilization. No lower extremity swelling or pain. No calf tenderness.  ROS: See HPI Constitutional: no fever  Eyes: no drainage  ENT: no runny nose   Cardiovascular:   chest pain  Resp: no SOB  GI: no vomiting GU: no dysuria Integumentary: no rash  Allergy: no hives  Musculoskeletal: no leg swelling  Neurological: no slurred speech ROS otherwise negative  PAST MEDICAL HISTORY/PAST SURGICAL HISTORY:  Past Medical History:  Diagnosis Date  . Behcet's disease ([place])   . Diplopia   . Graves disease 1991  . 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 Hip pain   . HLA B27 (HLA B27 positive)   . Knee pain   . [place]phea scleroderma   . Psoriatic arthritis ([place])    seen in ears only  . Raynaud's disease   . Sjogren syndrome with dental involvement     MEDICATIONS:  Prior to Admission medications   Medication Sig Start Date End Date Taking?  Authorizing Provider  cycloSPORINE (RESTASIS) 0.05 % ophthalmic emulsion Place 1 drop into both eyes 2 (two) times daily. 01/01/20   Simmons-Robinson, Makiera, MD  DULoxetine (CYMBALTA) 30 MG capsule Take 1 capsule (30 mg total) by mouth daily. 01/01/20   Simmons-Robinson, Riki Sheer, MD  levothyroxine (SYNTHROID) 137 MCG tablet Take 1 tablet (137 mcg total) by mouth daily before breakfast. 01/02/20   Simmons-Robinson, Makiera, MD  Multiple Vitamin (MULTIVITAMIN) tablet Take 1 tablet by mouth daily.    [provider]  omeprazole (PRILOSEC) 20 MG capsule Take 20 mg by mouth in the morning and at bedtime.    [provider]  ziprasidone (GEODON) 20 MG capsule Take 20 mg by mouth daily.    [provider]    ALLERGIES:  Allergies  Allergen Reactions  . Contrast Media [Iodinated Diagnostic Agents] Anaphylaxis  . Latex   . Ptu [Propylthiouracil]   . Sulfa Antibiotics   . Tapazole [Methimazole]     SOCIAL HISTORY:  Social History   Tobacco Use  . Smoking status: Former Research scientist (life sciences)  . Smokeless tobacco: Never Used  Substance Use Topics  . Alcohol use: Not on file    FAMILY HISTORY: No family history on file.  EXAM: BP (!) 147/85   Pulse 81   Temp 98.2 F (36.8 C) (Oral)   Resp 14   SpO2 91%  CONSTITUTIONAL: Alert and oriented and responds appropriately to questions. Well-appearing; well-nourished HEAD: Normocephalic EYES: Conjunctivae clear, pupils appear equal, EOM appear  intact ENT: normal nose; moist mucous membranes NECK: Supple, normal ROM CARD: RRR; S1 and S2 appreciated; no murmurs, no clicks, no rubs, no gallops RESP: Normal chest excursion without splinting or tachypnea; breath sounds clear and equal bilaterally; no wheezes, no rhonchi, no rales, no hypoxia or respiratory distress, speaking full sentences ABD/GI: Normal bowel sounds; non-distended; soft, non-tender, no rebound, no guarding, no peritoneal signs, no hepatosplenomegaly BACK:  The back  appears normal EXT: Normal ROM in all joints; no deformity noted, no edema; no cyanosis SKIN: Normal color for age and race; warm; no rash on exposed skin NEURO: Moves all extremities equally, sensation to light touch intact diffusely, cranial nerves II through XII intact, normal speech, no drift PSYCH: The patient's mood and manner are appropriate.   MEDICAL DECISION MAKING: Patient here with chest pain, dizziness, seeing spots, headache.  Was concerned that her blood pressure was elevated at home.  Here it is currently in the 140s/80s.  May be related to taking Sudafed today.  Labs here show no acute abnormality.  She has had 2 normal high-sensitivity troponins.  Will obtain D-dimer to rule out PE.  Doubt dissection.  She has no significant risk factors for ACS and EKG is nonischemic and she has had 2 normal troponins today.  She also reports history of chronic headaches but states this is more severe and not improving with normal Tylenol and ibuprofen.  Given that she complained of dizziness and seeing spots, will obtain head CT to rule out intracranial hemorrhage.  She is neurologically intact here.  Doubt stroke.  Will give Toradol for her headache and reassess.  ED PROGRESS: Minimal improvement after Toradol.  Head CT is unremarkable.  D-dimer negative.  Will give Compazine and reassess.  Patient's headache gone after Compazine.  Blood pressure now 119/75.  She is feeling much better.  No longer having chest pain.  Recommended she avoid Sudafed.  Recommended close follow-up with her PCP.  At this time, I do not feel there is any life-threatening condition present. I have reviewed, interpreted and discussed all results (EKG, imaging, lab, urine as appropriate) and exam findings with patient/family. I have reviewed nursing notes and appropriate previous records.  I feel the patient is safe to be discharged home without further emergent workup and can continue workup as an outpatient as needed.  Discussed usual and customary return precautions. Patient/family verbalize understanding and are comfortable with this plan.  Outpatient follow-up has been provided as needed. All questions have been answered.    EKG Interpretation  Date/Time:  Thursday January 03 2020 19:30:52 EST Ventricular Rate:  102 PR Interval:  172 QRS Duration: 62 QT Interval:  328 QTC Calculation: 427 R Axis:   79 Text Interpretation: Sinus tachycardia Nonspecific ST and T wave abnormality Abnormal ECG No STEMI Confirmed by Octaviano Glow [phone]) on 01/03/2020 10:18:19 PM          [name] was evaluated in Emergency Department on 01/03/2020 for the symptoms described in the history of present illness. She was evaluated in the context of the global COVID-19 pandemic, which necessitated consideration that the patient might be at risk for infection with the SARS-CoV-2 virus that causes COVID-19. Institutional protocols and algorithms that pertain to the evaluation of patients at risk for COVID-19 are in a state of rapid change based on information released by regulatory bodies including the CDC and federal and state organizations. These policies and algorithms were followed during the patient's care in the ED.  Thetis Schwimmer, Delice Bison, DO 01/04/20 [phone]

## 2020-01-03 NOTE — Assessment & Plan Note (Signed)
Patient has undergone RAI and has since been on Synthroid since 1991. Free T4, T3, TSH collected today We will adjust Synthroid dose based on results Discussed referral to endocrinology once results and patient has had time to respond to change dose of Synthroid

## 2020-01-03 NOTE — ED Notes (Signed)
Patient is being discharged from the Urgent Care and sent to the Emergency Department via pov . Per Pia Mau, patient is in need of higher level of care due to HTN, vision changes, CP. Patient is aware and verbalizes understanding of plan of care.  Vitals:   01/03/20 1859  BP: (!) 176/88  Pulse: 95  Resp: 20  SpO2: 97%

## 2020-01-03 NOTE — ED Triage Notes (Signed)
Pt presents to ED POV. Pt c/o CP and headache. Pt reports that her bp is much higher than normal (115/70). Pt reports that she was sent here from UC.

## 2020-01-04 LAB — D-DIMER, QUANTITATIVE: D-Dimer, Quant: 0.48 ug/mL-FEU (ref 0.00–0.50)

## 2020-01-04 MED ORDER — PROCHLORPERAZINE EDISYLATE 10 MG/2ML IJ SOLN
10.0000 mg | Freq: Once | INTRAMUSCULAR | Status: AC
  Administered 2020-01-04: 10 mg via INTRAVENOUS
  Filled 2020-01-04: qty 2

## 2020-01-04 NOTE — ED Notes (Signed)
Pt offered graham crackers, and [place] sandwich.

## 2020-01-04 NOTE — ED Notes (Signed)
Pt put on 2 l [place]

## 2020-01-04 NOTE — Discharge Instructions (Signed)
I recommend that you stop taking Sudafed.  Please follow-up with your primary care doctor.  Please continue to monitor your blood pressure closely.  Your labs, head CT, chest x-ray, EKG today were reassuring.

## 2020-01-04 NOTE — ED Notes (Signed)
Patient verbalized understanding of discharge instructions. Opportunity for questions and answers.  

## 2020-01-07 ENCOUNTER — Other Ambulatory Visit: Payer: Self-pay | Admitting: Family Medicine

## 2020-01-07 NOTE — Progress Notes (Unsigned)
Contacted patient regarding rheumatology referral request for previous records and lab results for her rheumatologic history. Patient reports having some records brom about 10 years ago and would need to search for them once she has more help at home. Patient is agreeable to re-testing for updated lab work to help with referral process.  Rheumatology office contact Jodi Gilmore requesting new labs along with any available records from prior rheum visits in [facility] or [place].

## 2020-01-09 ENCOUNTER — Telehealth: Payer: Self-pay | Admitting: Family Medicine

## 2020-01-09 NOTE — Telephone Encounter (Signed)
Patient calling to let Dr Neita Garnet know that [facility] does not take her insurance so she would need to be referred to another place. Please let patient know where the new place is.

## 2020-01-10 NOTE — Telephone Encounter (Signed)
Pt informed. Majesty Stehlin, CMA  

## 2020-01-14 ENCOUNTER — Other Ambulatory Visit: Payer: Self-pay | Admitting: Family Medicine

## 2020-01-15 ENCOUNTER — Ambulatory Visit: Payer: 59 | Admitting: Family Medicine

## 2020-01-16 ENCOUNTER — Other Ambulatory Visit: Payer: Self-pay | Admitting: Family Medicine

## 2020-01-16 DIAGNOSIS — E669 Obesity, unspecified: Secondary | ICD-10-CM

## 2020-01-16 DIAGNOSIS — R197 Diarrhea, unspecified: Secondary | ICD-10-CM

## 2020-01-16 DIAGNOSIS — Z872 Personal history of diseases of the skin and subcutaneous tissue: Secondary | ICD-10-CM

## 2020-01-16 DIAGNOSIS — Z1589 Genetic susceptibility to other disease: Secondary | ICD-10-CM

## 2020-01-16 DIAGNOSIS — M349 Systemic sclerosis, unspecified: Secondary | ICD-10-CM

## 2020-01-16 DIAGNOSIS — M3500 Sicca syndrome, unspecified: Secondary | ICD-10-CM

## 2020-01-16 NOTE — Progress Notes (Signed)
Future rheumatology labs ordered for hx of sjogren's, psoriatic arthritis, HlA B27, Scleroderma and Raynaud's at appt 01/28/20 for rheum referral

## 2020-01-28 ENCOUNTER — Ambulatory Visit: Payer: 59 | Admitting: Family Medicine

## 2020-01-31 ENCOUNTER — Encounter: Payer: Self-pay | Admitting: Gastroenterology

## 2020-02-04 ENCOUNTER — Ambulatory Visit: Payer: 59 | Admitting: Family Medicine

## 2020-02-12 ENCOUNTER — Other Ambulatory Visit: Payer: Self-pay

## 2020-02-12 ENCOUNTER — Other Ambulatory Visit (HOSPITAL_COMMUNITY)
Admission: RE | Admit: 2020-02-12 | Discharge: 2020-02-12 | Disposition: A | Discharge status: Home / Self Care | Payer: 59 | Source: Ambulatory Visit | Attending: Family Medicine | Admitting: Family Medicine

## 2020-02-12 ENCOUNTER — Ambulatory Visit (INDEPENDENT_AMBULATORY_CARE_PROVIDER_SITE_OTHER): Payer: 59 | Admitting: Family Medicine

## 2020-02-12 VITALS — BP 128/76 | HR 99 | Ht 65.0 in | Wt 172.4 lb

## 2020-02-12 DIAGNOSIS — M13862 Other specified arthritis, left knee: Secondary | ICD-10-CM

## 2020-02-12 DIAGNOSIS — R7982 Elevated C-reactive protein (CRP): Secondary | ICD-10-CM

## 2020-02-12 DIAGNOSIS — Z1589 Genetic susceptibility to other disease: Secondary | ICD-10-CM

## 2020-02-12 DIAGNOSIS — M25562 Pain in left knee: Secondary | ICD-10-CM

## 2020-02-12 DIAGNOSIS — M545 Low back pain, unspecified: Secondary | ICD-10-CM

## 2020-02-12 DIAGNOSIS — M3500 Sicca syndrome, unspecified: Secondary | ICD-10-CM

## 2020-02-12 DIAGNOSIS — G8929 Other chronic pain: Secondary | ICD-10-CM

## 2020-02-12 DIAGNOSIS — F319 Bipolar disorder, unspecified: Secondary | ICD-10-CM

## 2020-02-12 DIAGNOSIS — M25552 Pain in left hip: Secondary | ICD-10-CM

## 2020-02-12 DIAGNOSIS — M349 Systemic sclerosis, unspecified: Secondary | ICD-10-CM

## 2020-02-12 DIAGNOSIS — R197 Diarrhea, unspecified: Secondary | ICD-10-CM

## 2020-02-12 DIAGNOSIS — Z0000 Encounter for general adult medical examination without abnormal findings: Secondary | ICD-10-CM

## 2020-02-12 DIAGNOSIS — M1610 Unilateral primary osteoarthritis, unspecified hip: Secondary | ICD-10-CM

## 2020-02-12 DIAGNOSIS — Z124 Encounter for screening for malignant neoplasm of cervix: Secondary | ICD-10-CM

## 2020-02-12 DIAGNOSIS — Z872 Personal history of diseases of the skin and subcutaneous tissue: Secondary | ICD-10-CM

## 2020-02-12 DIAGNOSIS — E669 Obesity, unspecified: Secondary | ICD-10-CM

## 2020-02-12 MED ORDER — CETIRIZINE HCL 10 MG PO TABS: 10.0000 mg | ORAL_TABLET | Freq: Every day | ORAL | 1 refills | Status: AC

## 2020-02-12 MED ORDER — ZIPRASIDONE HCL 20 MG PO CAPS: 20.0000 mg | ORAL_CAPSULE | Freq: Two times a day (BID) | ORAL | 0 refills | Status: DC

## 2020-02-12 MED ORDER — ZIPRASIDONE HCL 20 MG PO CAPS: 20.0000 mg | ORAL_CAPSULE | Freq: Every day | ORAL | 0 refills | Status: DC

## 2020-02-12 MED ORDER — OMEPRAZOLE 20 MG PO CPDR: 20.0000 mg | DELAYED_RELEASE_CAPSULE | Freq: Two times a day (BID) | ORAL | 0 refills | Status: DC

## 2020-02-12 MED ORDER — DULOXETINE HCL 40 MG PO CPEP: 30.0000 mg | ORAL_CAPSULE | Freq: Every day | ORAL | 1 refills | Status: DC

## 2020-02-12 MED ORDER — LEVOTHYROXINE SODIUM 125 MCG PO TABS: 125.0000 ug | ORAL_TABLET | Freq: Every day | ORAL | 1 refills | Status: DC

## 2020-02-12 MED ORDER — DULOXETINE HCL 40 MG PO CPEP: ORAL_CAPSULE | ORAL | 1 refills | Status: DC

## 2020-02-12 NOTE — Patient Instructions (Addendum)
Please go to [facility] imaging located at [address] for x-rays of your hip and knee and back .  Depending on these results, we can set you up with a referral to orthopedics.  We have collected blood work today.  I will follow rheumatology to send these results and receipt with your referral.  Once the results of your Pap smear are available, I will notify you of any abnormal results.  Please plan to follow-up with me in 3-4 weeks.  Please review the list below to see who you would like to establish care with for psychiatry. I will provide a few months of Geodon and will then defer to your psychiatrist.   Psychiatry Resource List (Adults and Children) Most of these providers will take Medicaid. please consult your insurance for a complete and updated list of available providers. When calling to make an appointment have your insurance information available to confirm you are covered.   [facility]:Psychiatry and Counseling [address] [place], [place] [address] [phone]  [facility]  [address] [place], [place] [phone] Crisis [phone]   [facility]:   [facility]: [address]     [phone]   [facility]: [address] [place]. [place],        [phone] [place]: [address],    [phone] [place] Sharper: [address],                   [phone] Children: [facility] [address]         [phone]   [facility]  (Psychiatry only; Adults /children 12 and over, will take Medicaid)  [address] Laurell Josephs [address], [place], [place] [address]       [phone]   [facility] (Psychiatry & counseling ; adults & children ; will take Medicaid [address]  [place] [address]   Go on-line to complete referral ( [email or web address] [phone]    (Spanish therapist)  [facility]  Psychiatry & counseling; Adults and children;  Call Registration prior to scheduling an appointment [phone] [address]    [place], [place] [address]    [phone]  [facility] (Psychiatry & counseling; adults & children; Medicare no Medicaid)  [address]   [place], [place]  [address]      [phone]    [facility] (up to age 45)  Psychiatry & counseling ,will take Medicaid, must do counseling to receive psychiatry services  [address] [place] [address]        [phone]  [facility] (Psychiatry & counseling; adults & children; will take Medicaid) Will need a referral from provider [address],  [place], [place]  [phone]   [facility] --- Walk-In Mon-Friday 8am-3pm ( will take Medicaid, Psychiatry, Adults & children,  [address], [place], [place]   [phone]   [facility], Walk-in M-F 8am-12pm and 1pm -3pm   (Counseling, Psychiatry, will take Medicaid, adults & children)  [address] [place], [place]  [phone]

## 2020-02-12 NOTE — Progress Notes (Signed)
° ° °  SUBJECTIVE:   CHIEF COMPLAINT / HPI: Rheumatologic work-up  Patient presents for lab work including lipid panel and basic metabolic panel.  Patient reports history of several rheumatologic diseases including Sjogren's disease, rheumatoid arthritis, Graves' disease, scleroderma, positive HLA-B 27 and Raynaud's.  Patient requests referral to rheumatology to establish care she recently established in our practice.  Today patient states she was recently researching pulse mom disease and is curious as to whether she had this infection when she went to a camp years ago.  Patient states that she is suspicious that her sister may have had Lyme disease prior to the onset of her dementia.  Patient states that she has a history of elevated eosinophils.    Arthritis Patient states that her hip pain knees have been hurting her.  Today she had to take ibuprofen due to the pain.  She reports that she has to sleep with her knee extended and lying on her back to help with her hip and left knee pain.  Patient states that she would like to see an orthopedic doctor for this problem that has been worsening over the years.   Bipolar Disorder Patient reports hx of BPD and taking Geodon. She states that she has been taking her Geodon twice daily and is almost out of her supply. She is requesting refills. Agreed to supply limited amount and discussed appropriateness of psychiatry referral to manage BPD. Patient voices understanding. Of note, patient reports being hospitalized when she was younger for this disorder.   Healthcare Maintenance  Patient is agreeable to Pap smear today.   PERTINENT  PMH / PSH:  Sjogren's syndrome Raynaud's disease Psoriatic arthritis Scleroderma HLA-B27 positive Graves' disease Bipolar Disorder   OBJECTIVE:   BP 128/76    Pulse 99    Ht _0  (1.651 m)    Wt 172 lb 6.4 oz (78.2 kg)    SpO2 99%    BMI 28.69 kg/m   General: female appearing stated age in no acute distress Cardio:  Normal S1 and S2, no S3 or S4. Rhythm is regular. No murmurs or rubs.  Bilateral radial pulses palpable Pulm: Clear to auscultation bilaterally, no crackles, wheezing, or diminished breath sounds. Normal respiratory effort, stable on RA Abdomen: Bowel sounds normal. Abdomen soft and non-tender.  Extremities: No peripheral edema. Warm/ well perfused.  No obvious deformities of lumbar back region or bilateral knees, normal strength  ASSESSMENT/PLAN:   Elevated C-reactive protein (CRP) Patient reports history of several rheumatologic disorders and is looking to establish care with a rheumatologist.  She is presenting today to obtain labs prior to this referral.  Patient unable to find her previous labs from her prior stay in a different state.  She continues to report arthritic problems and has some concern of a historical Lyme disease infection (report of elevated eosinophil count). CBC with diff  ANA Anti-RNA polymerase Antiscleroderma antibody CCP antibody High-sensitivity CRP HLA-B27 antigen   Healthcare maintenance Pap smear   Other specified arthritis, left knee Knee xrays  Will plan to refer to orthopedics if abnormal   Hip arthritis Left hip xray Will plan to refer to orthopedics if abnormal   Bipolar disorder [facility]) Patient reported hx of BPD  has been taking Geodon twice daily  Referral to psychiatry  Patient given list of potential providers to attempt to schedule an appt  Limited supply of Geodon prescribed       Eulis Foster, MD [facility]

## 2020-02-13 DIAGNOSIS — F319 Bipolar disorder, unspecified: Secondary | ICD-10-CM | POA: Insufficient documentation

## 2020-02-13 DIAGNOSIS — M797 Fibromyalgia: Secondary | ICD-10-CM | POA: Insufficient documentation

## 2020-02-13 DIAGNOSIS — R7982 Elevated C-reactive protein (CRP): Secondary | ICD-10-CM | POA: Insufficient documentation

## 2020-02-13 DIAGNOSIS — M13862 Other specified arthritis, left knee: Secondary | ICD-10-CM | POA: Insufficient documentation

## 2020-02-13 DIAGNOSIS — M1610 Unilateral primary osteoarthritis, unspecified hip: Secondary | ICD-10-CM | POA: Insufficient documentation

## 2020-02-13 NOTE — Assessment & Plan Note (Signed)
>>  ASSESSMENT AND PLAN FOR OTHER SPECIFIED ARTHRITIS, LEFT KNEE WRITTEN ON 02/13/2020  8:46 PM BY Ronnald Ramp, MD  Knee xrays  Will plan to refer to orthopedics if abnormal

## 2020-02-13 NOTE — Assessment & Plan Note (Signed)
Left hip xray Will plan to refer to orthopedics if abnormal

## 2020-02-13 NOTE — Assessment & Plan Note (Signed)
Pap smear

## 2020-02-13 NOTE — Assessment & Plan Note (Addendum)
Patient reported hx of BPD  has been taking Geodon twice daily  Referral to psychiatry  Patient given list of potential providers to attempt to schedule an appt  Limited supply of Geodon prescribed

## 2020-02-13 NOTE — Assessment & Plan Note (Signed)
Patient reports history of several rheumatologic disorders and is looking to establish care with a rheumatologist.  She is presenting today to obtain labs prior to this referral.  Patient unable to find her previous labs from her prior stay in a different state.  She continues to report arthritic problems and has some concern of a historical Lyme disease infection (report of elevated eosinophil count). CBC with diff  ANA Anti-RNA polymerase Antiscleroderma antibody CCP antibody High-sensitivity CRP HLA-B27 antigen

## 2020-02-13 NOTE — Assessment & Plan Note (Signed)
Knee xrays  Will plan to refer to orthopedics if abnormal

## 2020-02-14 LAB — CYTOLOGY - PAP: Diagnosis: NEGATIVE

## 2020-02-21 ENCOUNTER — Other Ambulatory Visit: Payer: Self-pay | Admitting: Family Medicine

## 2020-02-21 DIAGNOSIS — R7982 Elevated C-reactive protein (CRP): Secondary | ICD-10-CM

## 2020-02-21 DIAGNOSIS — Z8739 Personal history of other diseases of the musculoskeletal system and connective tissue: Secondary | ICD-10-CM

## 2020-02-21 MED ORDER — ATORVASTATIN CALCIUM 40 MG PO TABS: 40.0000 mg | ORAL_TABLET | Freq: Every day | ORAL | 3 refills | Status: DC

## 2020-02-21 NOTE — Progress Notes (Signed)
Referral to rheumatology re-submitted. Lab testing has been updated

## 2020-02-21 NOTE — Progress Notes (Signed)
Starting atorvastatin 40mg  for LDL 175

## 2020-02-22 LAB — LIPID PANEL
Chol/HDL Ratio: 3.8 ratio (ref 0.0–4.4)
Cholesterol, Total: 272 mg/dL — ABNORMAL HIGH (ref 100–199)
HDL: 71 mg/dL (ref >39)
LDL Chol Calc (NIH): 175 mg/dL — ABNORMAL HIGH (ref 0–99)
Triglycerides: 145 mg/dL (ref 0–149)
VLDL Cholesterol Cal: 26 mg/dL (ref 5–40)

## 2020-02-22 LAB — ANA: ANA Titer 1: NEGATIVE

## 2020-02-22 LAB — BASIC METABOLIC PANEL
BUN/Creatinine Ratio: 14 (ref 12–28)
BUN: 16 mg/dL (ref 8–27)
CO2: 21 mmol/L (ref 20–29)
Calcium: 9.9 mg/dL (ref 8.7–10.3)
Chloride: 103 mmol/L (ref 96–106)
Creatinine, Ser: 1.12 mg/dL — ABNORMAL HIGH (ref 0.57–1.00)
GFR calc Af Amer: 60 mL/min/{1.73_m2} (ref >59)
GFR calc non Af Amer: 52 mL/min/{1.73_m2} — ABNORMAL LOW (ref >59)
Glucose: 101 mg/dL — ABNORMAL HIGH (ref 65–99)
Potassium: 4.5 mmol/L (ref 3.5–5.2)
Sodium: 141 mmol/L (ref 134–144)

## 2020-02-22 LAB — CYCLIC CITRUL PEPTIDE ANTIBODY, IGG/IGA: Cyclic Citrullin Peptide Ab: 84 units — ABNORMAL HIGH (ref 0–19)

## 2020-02-22 LAB — HLA-B27 ANTIGEN: HLA B27: NEGATIVE

## 2020-02-22 LAB — HIGH SENSITIVITY CRP: CRP, High Sensitivity: 7.98 mg/L — ABNORMAL HIGH (ref 0.00–3.00)

## 2020-02-22 LAB — ANTI-SCLERODERMA ANTIBODY: Scleroderma (Scl-70) (ENA) Antibody, IgG: <0.2 AI (ref 0.0–0.9)

## 2020-02-22 LAB — ANTI-RNA POLYMERASE III (RDL): Anti-RNA Polymerase III (RDL): <20 Units (ref <20)

## 2020-02-29 ENCOUNTER — Other Ambulatory Visit: Payer: Self-pay

## 2020-02-29 ENCOUNTER — Ambulatory Visit (AMBULATORY_SURGERY_CENTER): Payer: 59 | Admitting: *Deleted

## 2020-02-29 VITALS — Ht 65.0 in | Wt 175.0 lb

## 2020-02-29 DIAGNOSIS — Z1211 Encounter for screening for malignant neoplasm of colon: Secondary | ICD-10-CM

## 2020-02-29 MED ORDER — SUPREP BOWEL PREP KIT 17.5-3.13-1.6 GM/177ML PO SOLN: 1.0000 | Freq: Once | ORAL | 0 refills | Status: AC

## 2020-02-29 NOTE — Progress Notes (Signed)
No egg or soy allergy known to patient  No issues with past sedation with any surgeries or procedures No intubation problems in the past  No FH of Malignant Hyperthermia No diet pills per patient No home 02 use per patient  No blood thinners per patient  Pt denies issues with constipation  No A fib or A flutter  EMMI video to pt or via MyChart  COVID 19 guidelines implemented in PV today with Pt and RN  Pt is fully vaccinated  for Covid   Due to the COVID-19 pandemic we are asking patients to follow certain guidelines.  Pt aware of COVID protocols and [facility] guidelines   Pt verified name, DOB, address and insurance during PV today. Pt mailed instruction packet to included paper to complete and mail back to [facility] with addressed and stamped envelope, Emmi video, copy of consent form to read and not return, and instruction. PV completed over the phone. Pt encouraged to call with questions or issues

## 2020-03-03 ENCOUNTER — Other Ambulatory Visit: Payer: Self-pay | Admitting: Family Medicine

## 2020-03-03 ENCOUNTER — Telehealth: Payer: Self-pay

## 2020-03-03 MED ORDER — CEQUA 0.09 % OP SOLN: 1.0000 [drp] | Freq: Two times a day (BID) | OPHTHALMIC | 0 refills | Status: DC

## 2020-03-03 NOTE — Telephone Encounter (Signed)
Limited supply of cequa sent to pharmacy

## 2020-03-03 NOTE — Telephone Encounter (Signed)
Patient calls nurse line stating Restasis has gone generic, therefore her insurance is no longer covering. Patient reports she would like to try another medication for dry eye her sister uses called Sequa. Please send to [facility] on [address] if appropriate.

## 2020-03-03 NOTE — Progress Notes (Signed)
Unclear if insurance will cover cequa eye drops. Will send supply to pharmacy and follow up with patient if these are covered during Mar 3rd visit. Can supply refills if appropriate at that time.

## 2020-03-13 ENCOUNTER — Ambulatory Visit: Payer: 59 | Admitting: Family Medicine

## 2020-03-14 ENCOUNTER — Encounter: Payer: 59 | Admitting: Gastroenterology

## 2020-03-19 ENCOUNTER — Telehealth: Payer: Self-pay | Admitting: Family Medicine

## 2020-03-19 NOTE — Telephone Encounter (Signed)
Patient is calling stating she would like a letter of lab results sent to her in the mail for her keepsake. Please advise. Thanks!

## 2020-03-21 ENCOUNTER — Encounter: Payer: Self-pay | Admitting: Family Medicine

## 2020-03-21 ENCOUNTER — Ambulatory Visit: Payer: 59 | Admitting: Family Medicine

## 2020-03-21 ENCOUNTER — Telehealth: Payer: Self-pay | Admitting: Family Medicine

## 2020-03-21 NOTE — Telephone Encounter (Signed)
Contacted patient via telephone in order to discuss follow-up request for results letter.  I have created a letter with her results from recent labs to be sent to patient's home address.  Also informed patient of update regarding her rheumatology referral.  Referral coordinator is requesting previous records from her previous rheumatologist.  Informed patient that we would need this information before she would be able to be scheduled.  Will need to replace a new referral when she has those records.  Advised patient to call office once she has the records so that we can place a new referral.

## 2020-03-21 NOTE — Progress Notes (Signed)
Letter of recent results to be mailed to patient as requested.

## 2020-03-25 ENCOUNTER — Telehealth: Payer: Self-pay

## 2020-03-25 NOTE — Telephone Encounter (Signed)
Pt called requested that Dr Neita Garnet called her so she can update her, and pt also has some questions.

## 2020-03-27 ENCOUNTER — Ambulatory Visit: Payer: 59 | Admitting: Family Medicine

## 2020-04-01 ENCOUNTER — Other Ambulatory Visit: Payer: Self-pay | Admitting: Family Medicine

## 2020-04-01 DIAGNOSIS — R7982 Elevated C-reactive protein (CRP): Secondary | ICD-10-CM

## 2020-04-01 MED ORDER — CYCLOSPORINE 0.05 % OP EMUL: 1.0000 [drp] | Freq: Two times a day (BID) | OPHTHALMIC | 5 refills | Status: DC

## 2020-04-01 NOTE — Telephone Encounter (Signed)
Patient reports she has not seen a rheumatologist in ~15 years and she does not have contact information to reach them as she does not remember the names of the physicians.  Patient has been able to discuss referral with Jazmin who has been in contact rheum office; will replace referral for rheum.   Patient reports that she is working to get her xrays scheduled as it is easier for her to go to a scheduled appointment than just walk in due to wait and limited transportation.   Insurance will only cover restasis brand multiple dose. She is requesting to have this medication changed in her chart.

## 2020-04-01 NOTE — Telephone Encounter (Signed)
Patient missed another call from Dr. Neita Garnet. She would like for her to call her back to discuss the issues she is having with her referrals. She has talked to jazmin and explained some of the issues she is having.   The best call back is [phone].

## 2020-04-01 NOTE — Telephone Encounter (Signed)
Attempted to return patient's call. No answer. Left voicemail.

## 2020-04-04 ENCOUNTER — Other Ambulatory Visit: Payer: Self-pay

## 2020-04-04 ENCOUNTER — Ambulatory Visit (AMBULATORY_SURGERY_CENTER): Payer: 59 | Admitting: Gastroenterology

## 2020-04-04 ENCOUNTER — Encounter: Payer: Self-pay | Admitting: Gastroenterology

## 2020-04-04 VITALS — BP 122/61 | HR 78 | Temp 96.9°F | Resp 17 | Ht 65.0 in | Wt 175.0 lb

## 2020-04-04 DIAGNOSIS — R194 Change in bowel habit: Secondary | ICD-10-CM

## 2020-04-04 DIAGNOSIS — R197 Diarrhea, unspecified: Secondary | ICD-10-CM | POA: Diagnosis not present

## 2020-04-04 DIAGNOSIS — R140 Abdominal distension (gaseous): Secondary | ICD-10-CM

## 2020-04-04 DIAGNOSIS — Z1211 Encounter for screening for malignant neoplasm of colon: Secondary | ICD-10-CM

## 2020-04-04 MED ORDER — SODIUM CHLORIDE 0.9 % IV SOLN: 500.0000 mL | Freq: Once | INTRAVENOUS | Status: DC

## 2020-04-04 NOTE — Progress Notes (Signed)
Report to PACU, RN, vss, BBS= Clear.  

## 2020-04-04 NOTE — Op Note (Signed)
[facility] Patient Name: Jodi Gilmore Procedure Date: 04/04/2020 2:59 PM MRN: [id number] Endoscopist: Meryl Dare , MD Age: 65 Referring MD:  Date of Birth: [birth year] Gender: Female Account #: [id number] Procedure:                Colonoscopy Indications:              Screening for colorectal malignant neoplasm,                            Incidental abdominal bloating, constipation                            alternating with diarrhea noted Medicines:                Monitored Anesthesia Care Procedure:                Pre-Anesthesia Assessment:                           - Prior to the procedure, a History and Physical                            was performed, and patient medications and                            allergies were reviewed. The patient's tolerance of                            previous anesthesia was also reviewed. The risks                            and benefits of the procedure and the sedation                            options and risks were discussed with the patient.                            All questions were answered, and informed consent                            was obtained. Prior Anticoagulants: The patient has                            taken no previous anticoagulant or antiplatelet                            agents. ASA Grade Assessment: II - A patient with                            mild systemic disease. After reviewing the risks                            and benefits, the patient was deemed in  satisfactory condition to undergo the procedure.                           After obtaining informed consent, the colonoscope                            was passed under direct vision. Throughout the                            procedure, the patient's blood pressure, pulse, and                            oxygen saturations were monitored continuously. The                            Olympus PCF-H190DL (#[id number])  Colonoscope was                            introduced through the anus and advanced to the the                            cecum, identified by appendiceal orifice and                            ileocecal valve. The ileocecal valve, appendiceal                            orifice, and rectum were photographed. The quality                            of the bowel preparation was adequate after                            extensive lavage and suction. The colonoscopy was                            performed without difficulty. The patient tolerated                            the procedure well. Scope In: 3:08:57 PM Scope Out: 3:25:30 PM Scope Withdrawal Time: 0 hours 14 minutes 4 seconds  Total Procedure Duration: 0 hours 16 minutes 33 seconds  Findings:                 The perianal and digital rectal examinations were                            normal.                           The entire examined colon appeared normal on direct                            and retroflexion views. Complications:            No immediate complications. Estimated blood  loss:                            None. Estimated Blood Loss:     Estimated blood loss: none. Impression:               - The entire examined colon is normal on direct and                            retroflexion views.                           - No specimens collected. Recommendation:           - Repeat colonoscopy in 10 years for screening                            purposes with a more extensive bowel prep.                           - Patient has a contact number available for                            emergencies. The signs and symptoms of potential                            delayed complications were discussed with the                            patient. Return to normal activities tomorrow.                            Written discharge instructions were provided to the                            patient.                           - High  fiber diet.                           - Continue present medications.                           - Miralax (OTC) qd prn constipation.                           - IBgard (OTC) 1-2 po tid prn abdominal pain,                            bloating. Meryl Dare, MD 04/04/2020 3:29:53 PM This report has been signed electronically.

## 2020-04-04 NOTE — Patient Instructions (Addendum)
Handout was given to your care partner on a high fiber diet with liberal fluid intake. You may resume your current medications today. Repeat colonoscopy in 10 years for screening purposes. Per Dr. Russella Dar add Miralax (over the counter) daily as needed for constipation and IBgard (over the counter) 1-2 by mouth 3 x per day as needed for abdominal pain and bloating. Please call if any questions or concerns.     YOU HAD AN ENDOSCOPIC PROCEDURE TODAY AT THE [facility]:   Refer to the procedure report that was given to you for any specific questions about what was found during the examination.  If the procedure report does not answer your questions, please call your gastroenterologist to clarify.  If you requested that your care partner not be given the details of your procedure findings, then the procedure report has been included in a sealed envelope for you to review at your convenience later.  YOU SHOULD EXPECT: Some feelings of bloating in the abdomen. Passage of more gas than usual.  Walking can help get rid of the air that was put into your GI tract during the procedure and reduce the bloating. If you had a lower endoscopy (such as a colonoscopy or flexible sigmoidoscopy) you may notice spotting of blood in your stool or on the toilet paper. If you underwent a bowel prep for your procedure, you may not have a normal bowel movement for a few days.  Please Note:  You might notice some irritation and congestion in your nose or some drainage.  This is from the oxygen used during your procedure.  There is no need for concern and it should clear up in a day or so.  SYMPTOMS TO REPORT IMMEDIATELY:   Following lower endoscopy (colonoscopy or flexible sigmoidoscopy):  Excessive amounts of blood in the stool  Significant tenderness or worsening of abdominal pains  Swelling of the abdomen that is new, acute  Fever of 100F or higher  For urgent or emergent issues, a gastroenterologist can  be reached at any hour by calling [phone]. Do not use MyChart messaging for urgent concerns.    DIET:  We do recommend a small meal at first, but then you may proceed to your regular diet.  Drink plenty of fluids but you should avoid alcoholic beverages for 24 hours.  ACTIVITY:  You should plan to take it easy for the rest of today and you should NOT DRIVE or use heavy machinery until tomorrow (because of the sedation medicines used during the test).    FOLLOW UP: Our staff will call the number listed on your records 48-72 hours following your procedure to check on you and address any questions or concerns that you may have regarding the information given to you following your procedure. If we do not reach you, we will leave a message.  We will attempt to reach you two times.  During this call, we will ask if you have developed any symptoms of COVID 19. If you develop any symptoms (ie: fever, flu-like symptoms, shortness of breath, cough etc.) before then, please call [phone].  If you test positive for Covid 19 in the 2 weeks post procedure, please call and report this information to [place].    If any biopsies were taken you will be contacted by phone or by letter within the next 1-3 weeks.  Please call us at [phone] if you have not heard about the biopsies in 3 weeks.    SIGNATURES/CONFIDENTIALITY: You  and/or your care partner have signed paperwork which will be entered into your electronic medical record.  These signatures attest to the fact that that the information above on your After Visit Summary has been reviewed and is understood.  Full responsibility of the confidentiality of this discharge information lies with you and/or your care-partner.

## 2020-04-04 NOTE — Progress Notes (Addendum)
Shelva Majestic, RN assisted pt with dressing. Maw  No problems noted in the recovery room. maw

## 2020-04-04 NOTE — Progress Notes (Signed)
VS by [place]  Pt's states no medical or surgical changes since previsit or office visit.  

## 2020-04-08 ENCOUNTER — Telehealth: Payer: Self-pay

## 2020-04-08 ENCOUNTER — Telehealth: Payer: Self-pay | Admitting: *Deleted

## 2020-04-08 NOTE — Telephone Encounter (Signed)
NO ANSWER, MESSAGE LEFT FOR PATIENT. 

## 2020-04-08 NOTE — Telephone Encounter (Signed)
Second attempt, no answer and no VM.  ?

## 2020-04-09 ENCOUNTER — Telehealth (HOSPITAL_COMMUNITY): Payer: 59 | Admitting: Physician Assistant

## 2020-04-09 ENCOUNTER — Encounter: Payer: Self-pay | Admitting: *Deleted

## 2020-04-11 ENCOUNTER — Ambulatory Visit: Payer: 59 | Admitting: Family Medicine

## 2020-04-23 ENCOUNTER — Encounter: Payer: Self-pay | Admitting: Family Medicine

## 2020-05-01 ENCOUNTER — Other Ambulatory Visit: Payer: Self-pay | Admitting: Family Medicine

## 2020-05-01 NOTE — Telephone Encounter (Signed)
Will forward to MD to make her aware that patient will need eye drops to be managed by her until her appt this summer.  Jeniah Kishi,CMA

## 2020-05-08 ENCOUNTER — Ambulatory Visit
Admission: RE | Admit: 2020-05-08 | Discharge: 2020-05-08 | Disposition: A | Discharge status: Home / Self Care | Payer: 59 | Source: Ambulatory Visit | Attending: Family Medicine | Admitting: Family Medicine

## 2020-05-08 DIAGNOSIS — M25562 Pain in left knee: Secondary | ICD-10-CM

## 2020-05-08 DIAGNOSIS — M545 Low back pain, unspecified: Secondary | ICD-10-CM

## 2020-05-08 DIAGNOSIS — G8929 Other chronic pain: Secondary | ICD-10-CM

## 2020-05-08 DIAGNOSIS — M25552 Pain in left hip: Secondary | ICD-10-CM

## 2020-05-08 IMAGING — CR DG HIP (WITH OR WITHOUT PELVIS) 2-3V*L*
2 series · 2 of 2 positions shown · non-contrast
Comparison: None.

CLINICAL DATA: 64-year-old female with left lower extremity pain.

EXAM:
LEFT KNEE - COMPLETE 4+ VIEW; DG HIP (WITH OR WITHOUT PELVIS) 2-3V
LEFT

[w hip ap left]
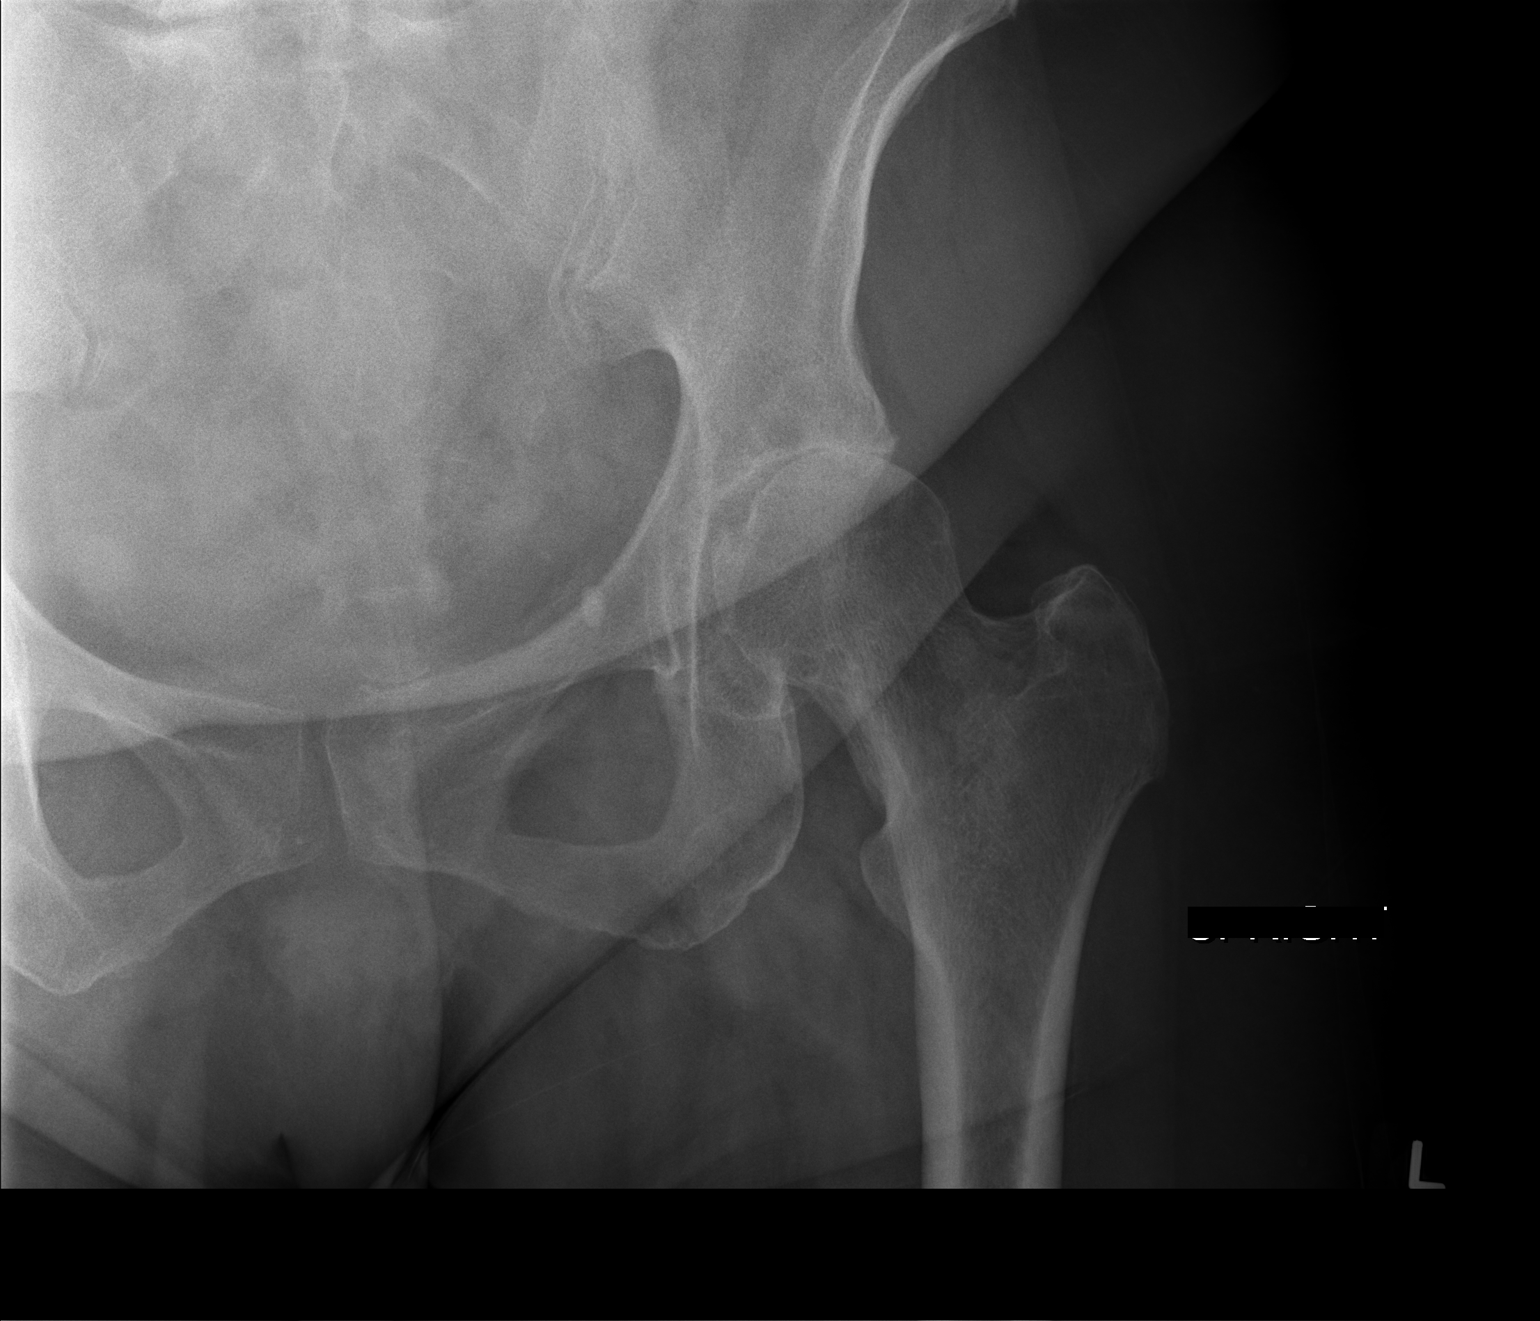

[w hip lat left]
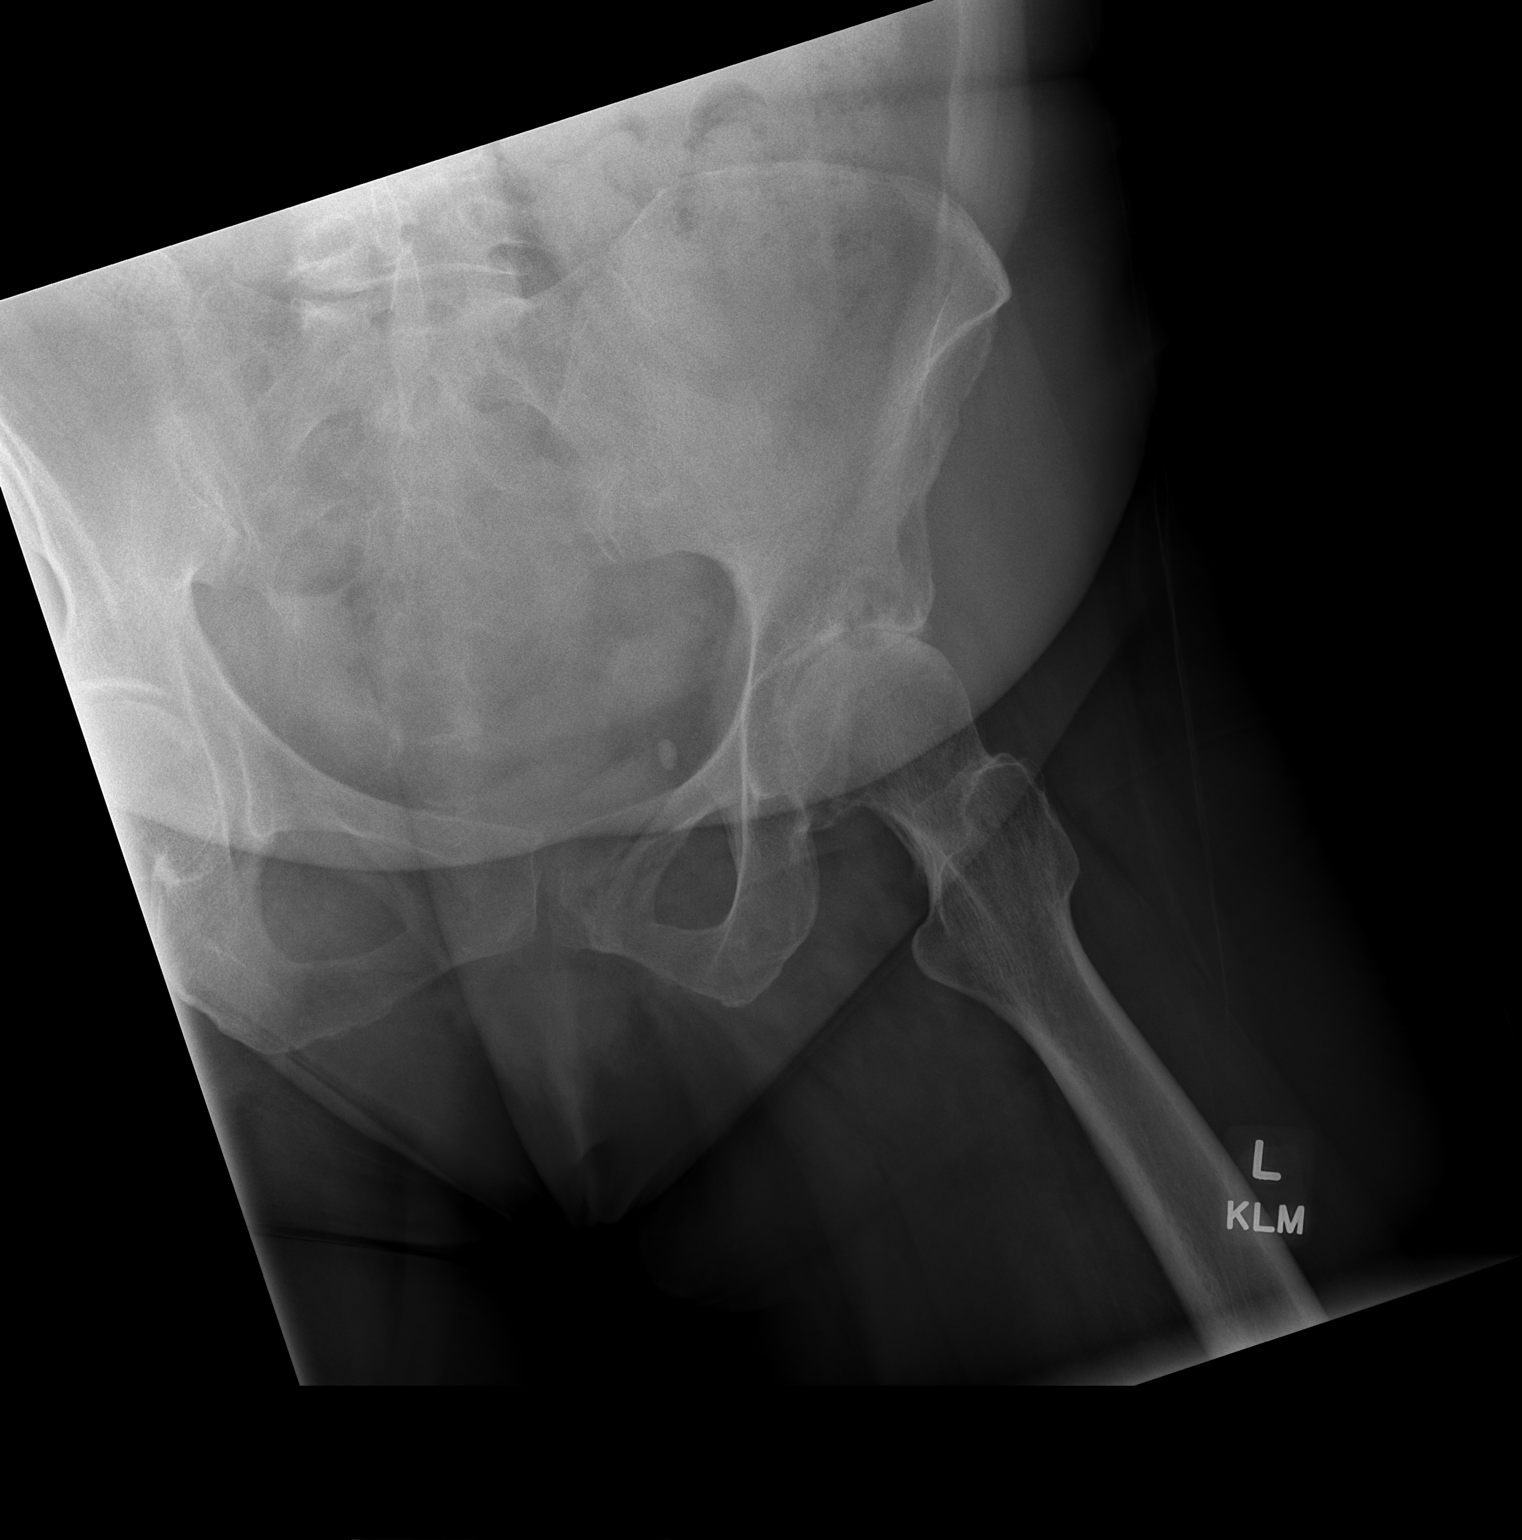

[2 of 2 positions shown; findings below may reference images not displayed]

FINDINGS: There is no acute fracture or dislocation. There is severe arthritic
changes of the left hip with near complete loss of joint space and
bone-on-bone contact superiorly. Minimal arthritic changes of the
left knee. No joint effusion. The soft tissues are unremarkable. An
8 mm radiopaque focus over the left pelvis, likely a phlebolith.
IMPRESSION: 1. No acute fracture or dislocation.
2. Severe arthritic changes of the left hip.

## 2020-05-08 IMAGING — CR DG LUMBAR SPINE COMPLETE 4+V
5 series · 5 of 5 positions shown · non-contrast
Comparison: None.

CLINICAL DATA: Chronic back pain.

EXAM:
LUMBAR SPINE - COMPLETE 4+ VIEW

[t lumbar spine ap]
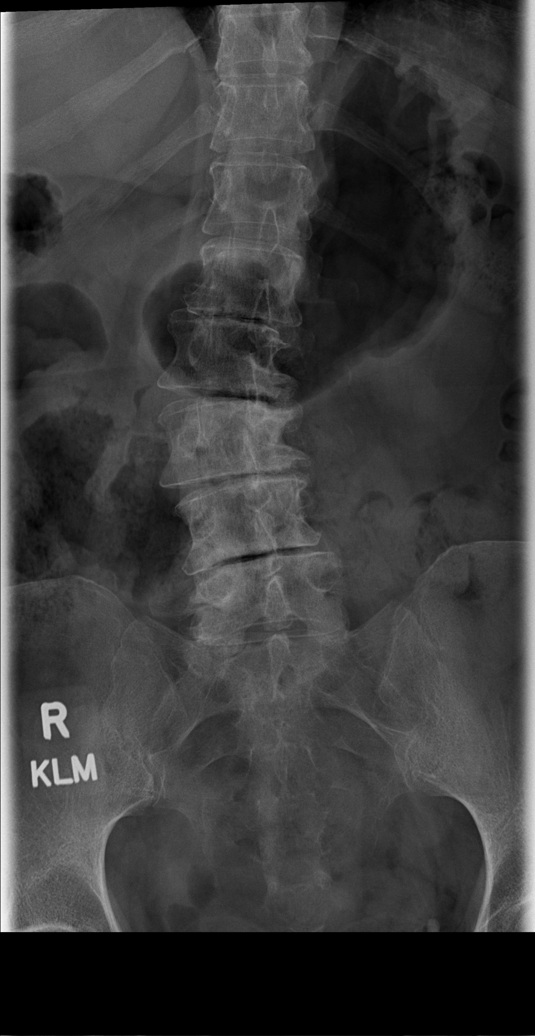

[t lumbar spine obl (1 of 2)]
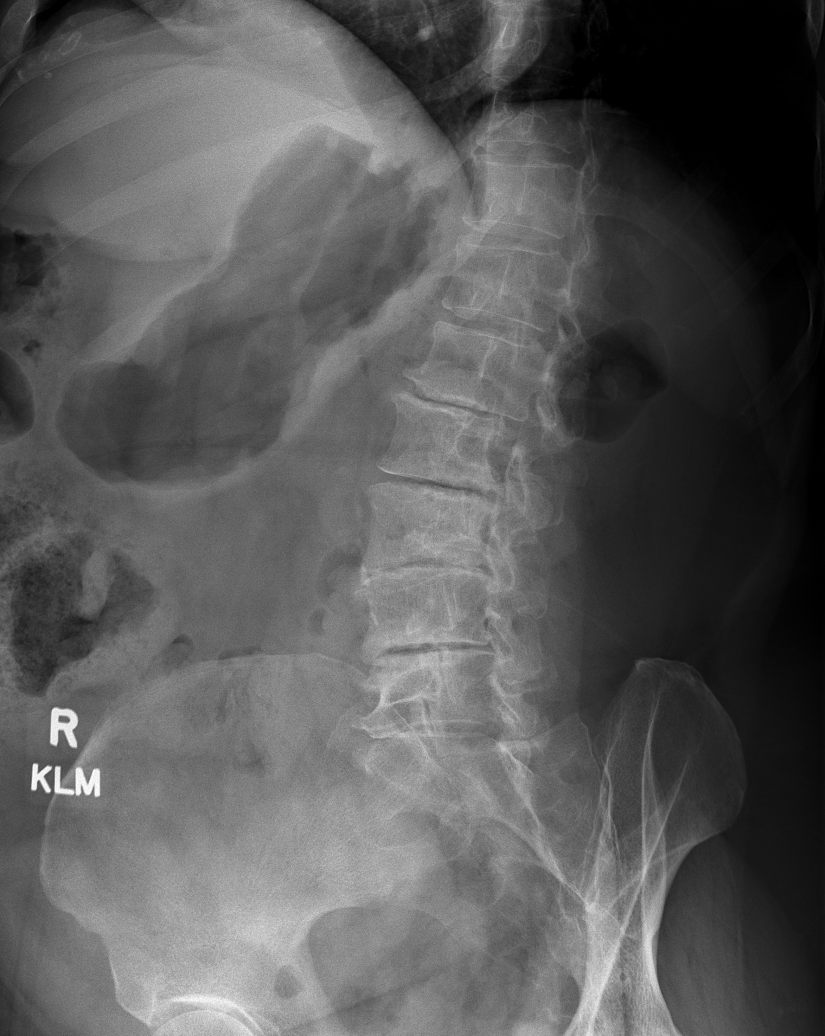

[t lumbar spine obl (2 of 2)]
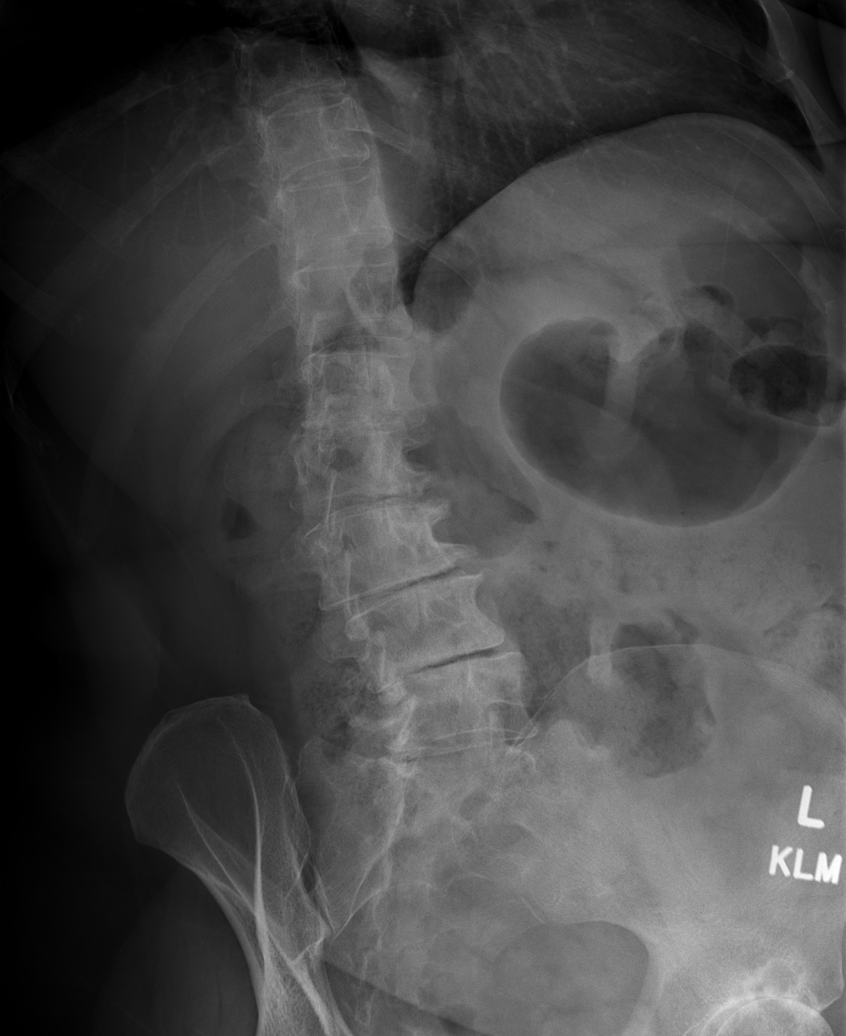

[t lumbar spine lat]
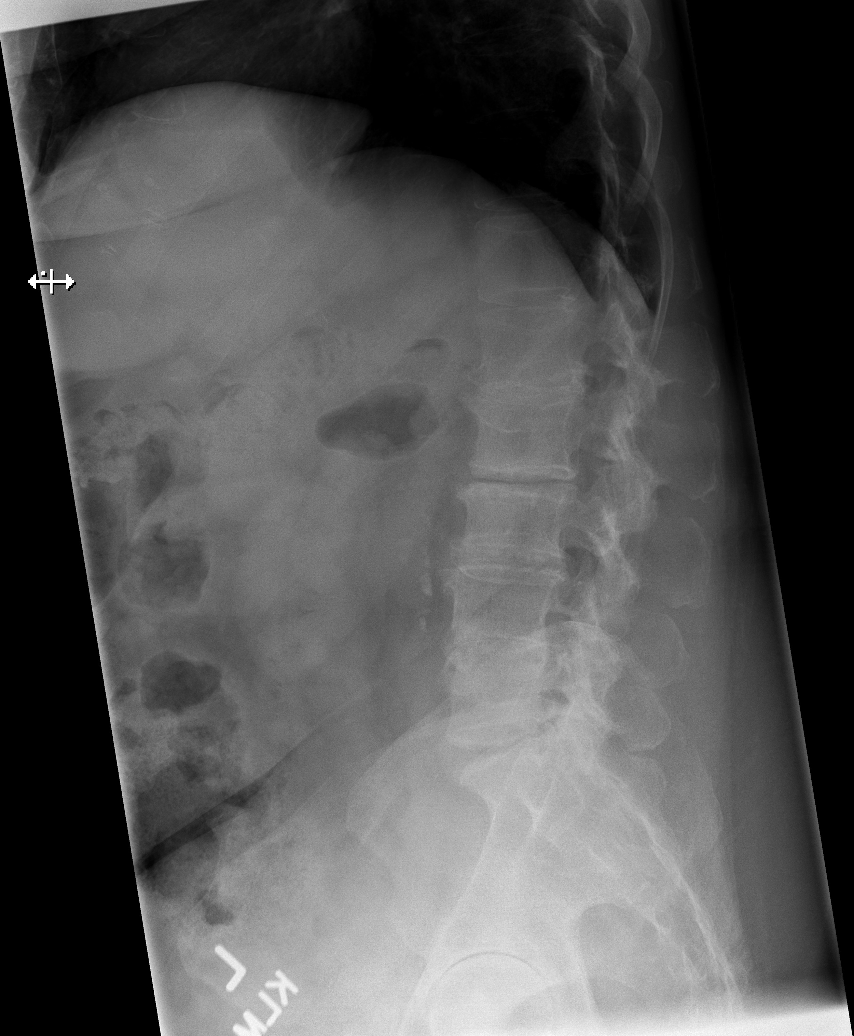

[t lumbar l-5 s-1 spot]
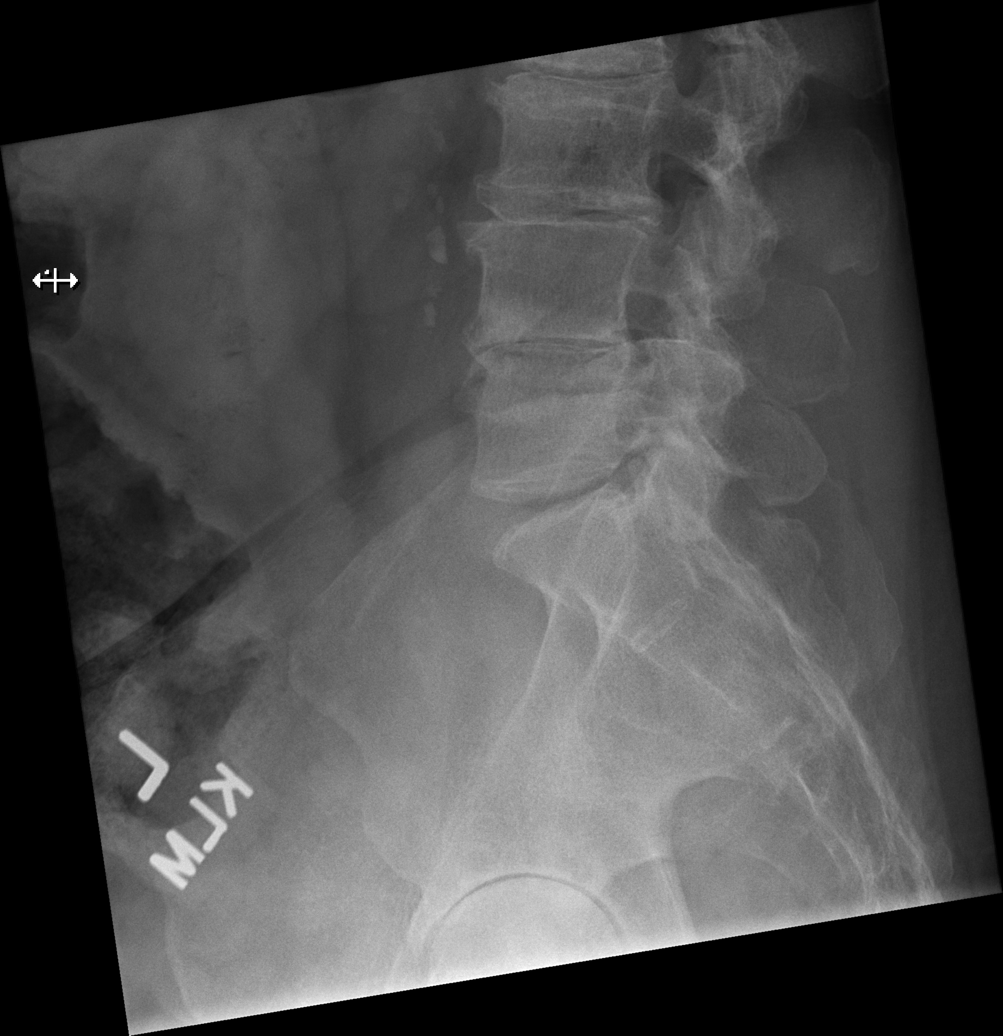

[5 of 5 positions shown; findings below may reference images not displayed]

FINDINGS: Convex rightward lumbar scoliosis evident. There is diffuse loss of
disc height in the lumbar spine with associated endplate
degenerative changes. No evidence for lumbar spine fracture or
subluxation. Bones are diffusely demineralized. SI joints
unremarkable. No worrisome lytic or sclerotic osseous abnormality.
IMPRESSION: Convex rightward lumbar scoliosis with diffuse degenerative disc
disease. No acute bony abnormality.

## 2020-05-08 IMAGING — CR DG KNEE COMPLETE 4+V*L*
4 series · 4 of 4 positions shown · non-contrast
Comparison: None.

CLINICAL DATA: 64-year-old female with left lower extremity pain.

EXAM:
LEFT KNEE - COMPLETE 4+ VIEW; DG HIP (WITH OR WITHOUT PELVIS) 2-3V
LEFT

[w knee ap left]
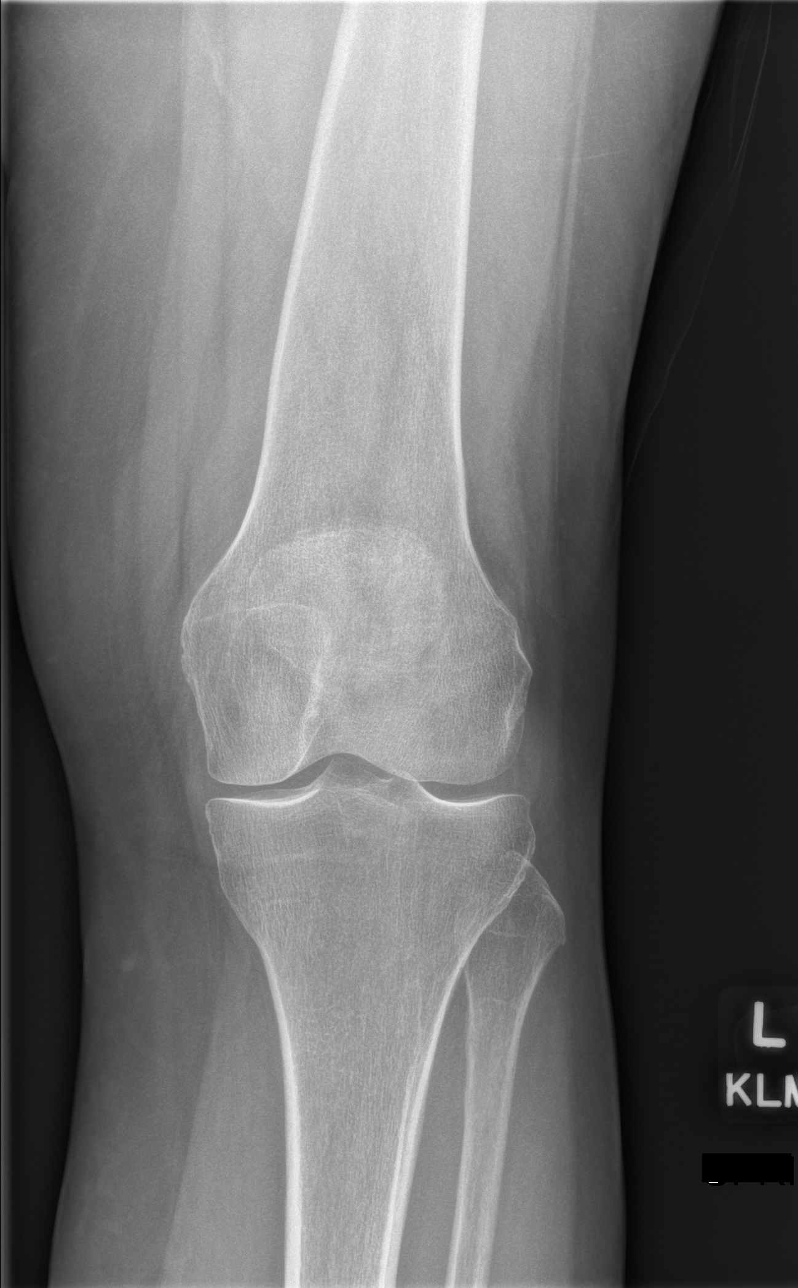

[w knee lat left]
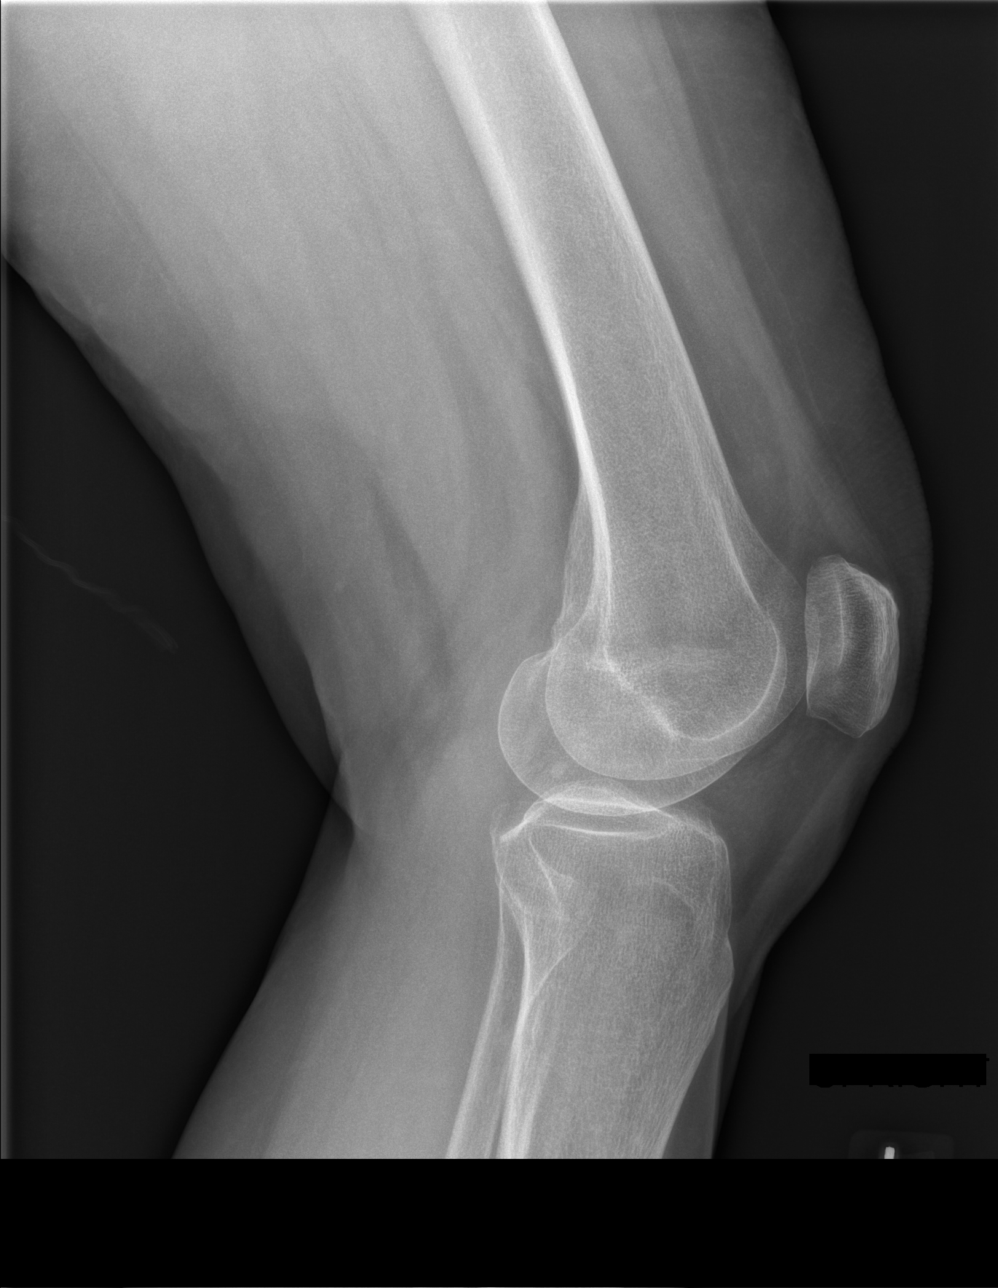

[w knee tunnel pa left]
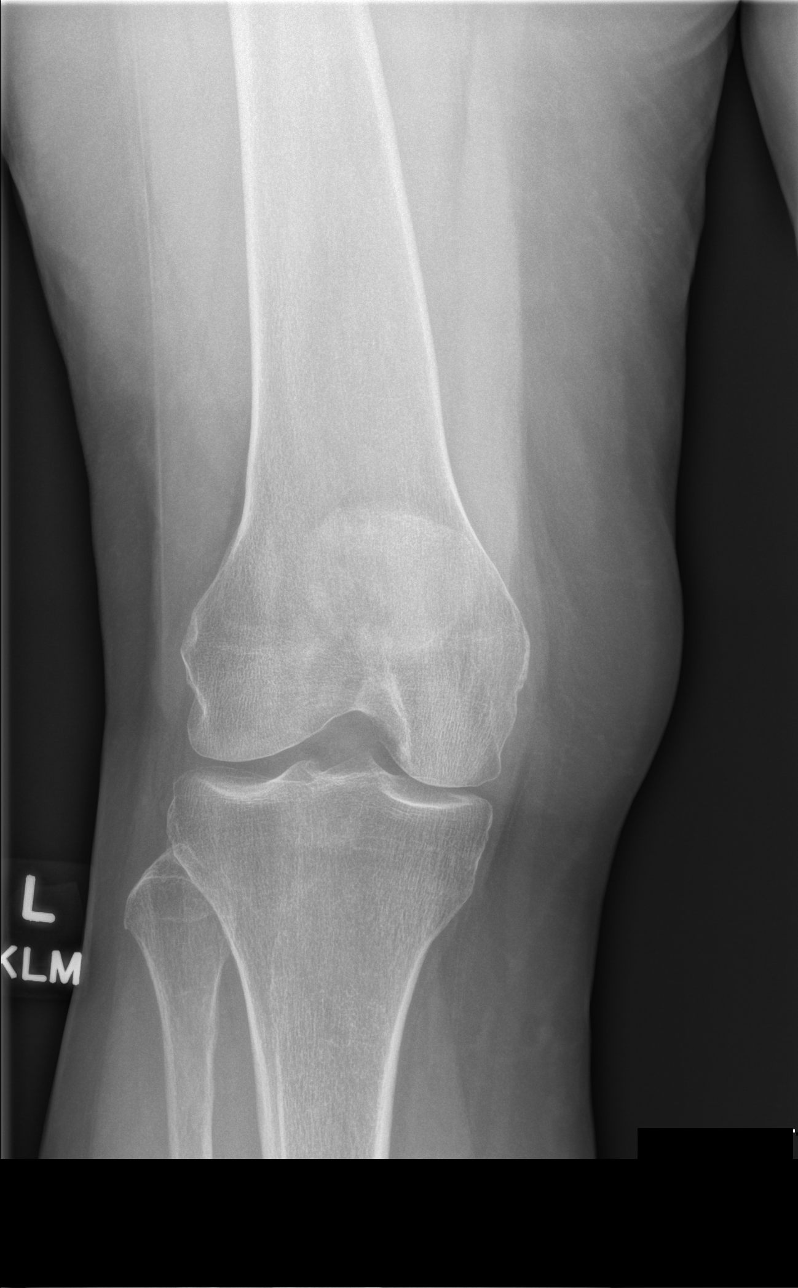

[x knee sunrise left]
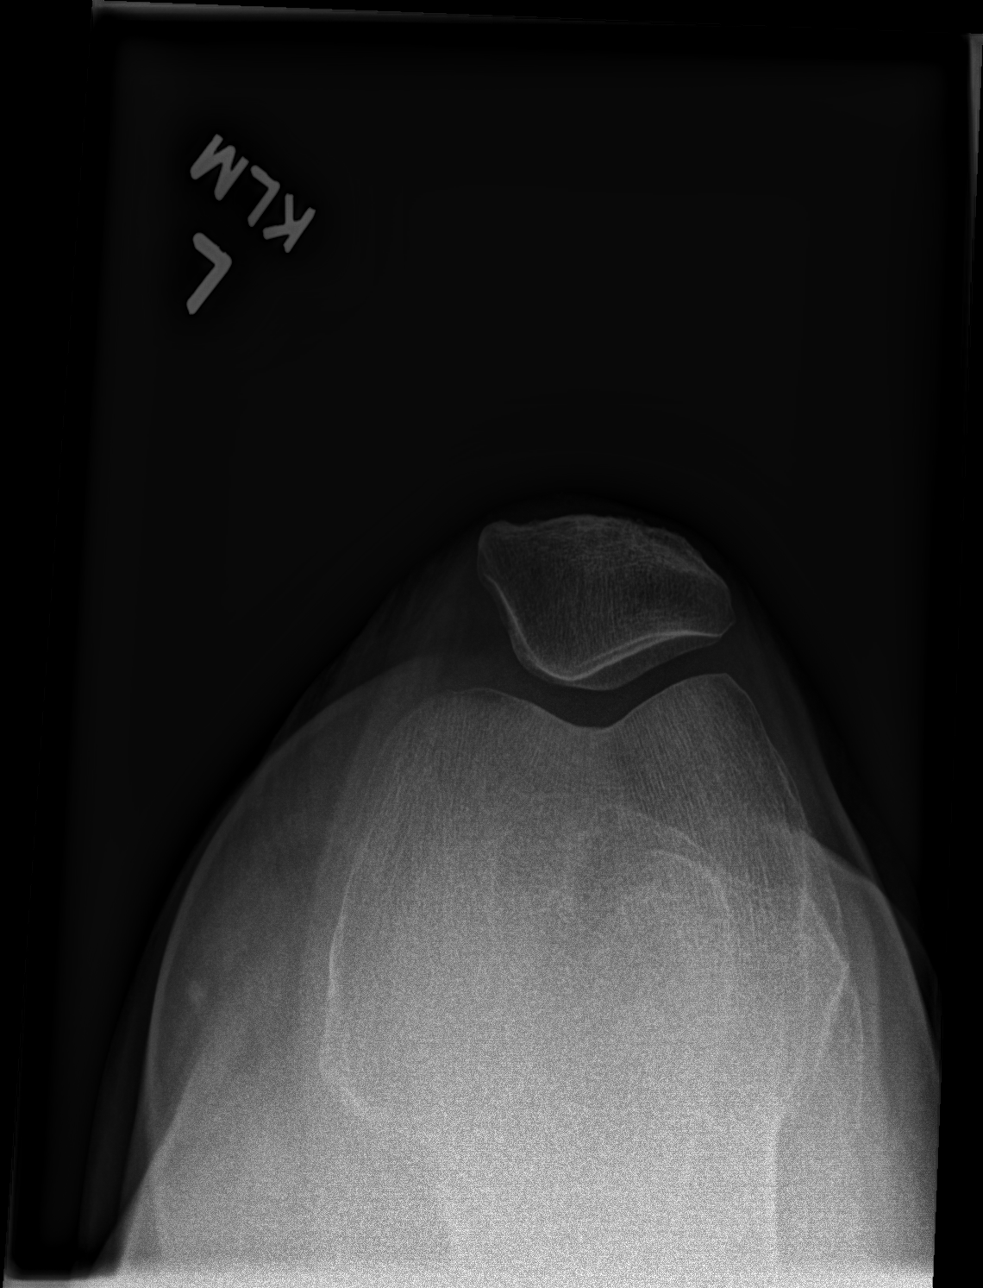

[4 of 4 positions shown; findings below may reference images not displayed]

FINDINGS: There is no acute fracture or dislocation. There is severe arthritic
changes of the left hip with near complete loss of joint space and
bone-on-bone contact superiorly. Minimal arthritic changes of the
left knee. No joint effusion. The soft tissues are unremarkable. An
8 mm radiopaque focus over the left pelvis, likely a phlebolith.
IMPRESSION: 1. No acute fracture or dislocation.
2. Severe arthritic changes of the left hip.

## 2020-05-14 NOTE — Progress Notes (Addendum)
SUBJECTIVE:   CHIEF COMPLAINT / HPI: follow up for hip/back pain, renal function and cholesterol   Hip Pain  Hx of arthritis  Xrays showed severe arthritis changes without acute fractures. She also noted to have scoliosis. Patient presents with friend to help with taking notes and transportation. Patient reports that she has continued to have pain in her left hip and back for which she takes several ibuprofen and tylenol daily. Patient requests to increase her dose of duloxetine as well. She also requests to see orthopedics for her joint pain. She is still able to ambulate independently. She often has trouble finding a comfortable position to sleep in at night due to pain.   Hx of Bipolar Disorder Patient reports that she has been taking geodon once daily and has a virtual appt with psychiatry on 5/25 at 1600. Patient reports not taking her medication as prescribed (twice daily) for several years. She is hoping that speaking with psychiatry will help her to get back on track.    Migraine  Patient states that she has a long hx of migraine headaches. She states that her migraines have returned and are only relieved by taking maxalt. She reports having blurry vision at baseline due to severely dry eyes. She has not noticed any change in the character of her headaches. She reports trying verapamil in the past with no success and has tried Topamax with adverse reaction. She would like to restart maxalt before being referred to neurology.   Rhinorrhea  Patient reports that she often has copious rhinorrhea and maxillary sinus tenderness. She has been taking benadryl which provides some relief of her symptoms. She is also taking zyrtec. Patient is unsure if she has had any fevers due to taking so much tylenol and NSAIDS. She often does feel that her body alternates between hot and cold. She rarely has any sneezing.   Hypothyroidism  Patient is requesting to have a thyroid panel specifically  including T3, T4 and TSH. She expresses concern that she may need to have her synthroid dose increased.   PERTINENT  PMH / PSH:  Hypothyroidism  Osteoarthritis  Bipolar Disorder   OBJECTIVE:   BP (!) 160/94   Pulse (!) 120   Ht 5\' 5"  (1.651 m)   Wt 170 lb 12.8 oz (77.5 kg)   SpO2 97%   BMI 28.42 kg/m   General: female appearing stated age, slightly anxious appearing  HEENT: MMM, no oral lesions noted,Neck non-tender without lymphadenopathy, masses or thyromegaly Cardio: mildly tachycardiac on exam. Rhythm is regular. No murmurs or rubs.  Bilateral radial pulses palpable Pulm: Clear to auscultation bilaterally, no crackles, wheezing, or diminished breath sounds. Normal respiratory effort Abdomen: Bowel sounds normal. Abdomen soft and non-tender Neuro: pt alert and oriented x4, sensation to light touch appears to be intact in all extremities, PERRLA, EOMI, no facial asymmetry,   Hip:  - Inspection: No gross deformity, no swelling, erythema, or ecchymosis - Palpation: No TTP, specifically none over greater trochanter - ROM: Normal range of motion on Flexion, extension, abduction, internal and external rotation - Strength: Normal strength. - Neuro/vasc: NV intact distally  Back + scoliosis. TTP left lumbar region, no overlying skin changes  Unable to extend back, full forward flexion but limited 2/2 to pain. Strength LEs 5/5 all muscle groups.   2+ MSRs in patellar and achilles tendons, equal bilaterally. Sensation intact to light touch bilaterally.   HR and BP improved on repeat measurement. Please see updated vitals in  flowsheet.   ASSESSMENT/PLAN:   Hip arthritis Chronic issue. Continues to take daily NSAIDS and Tylenol for pain  - prescribe Tramadol PRN  - reviewed Xrays of hip and back with [facility] fellow,Dr. Alfredo Bach who is agreeable with referral to sports medicine for evaluation and consideration of further imaging  - will also increase duloxetine for back pain to 60mg    - check creatinine today as past level was slightly elevated, especially given frequent use of NSAIDS   Hypothyroidism (acquired) TSH measured today  Slightly improved from 14 to 13, yet still elevated  Increased synthroid dose to  Will follow up in 6 weeks   Bipolar disorder (HCC) Chronic issue on geodon.  Patient to follow up with psychiatry on 5/25  Continue geodon and will await any further updates from psychiatry   Migraine headache with aura Patient reports return of migraine symptoms.  Will prescribe maxalt as this worked for patient in the past    Sinusitis chronic, frontal Patient reports that has been worsening over the last two weeks with clear rhinorrhea. No clear fevers/chills.  - continue zyrtec - will add flonase  - will hold off on abx treatment at this time    HLD (hyperlipidemia) Patient currently on statin therapy - will measure direct LDL      6/25, MD [facility]

## 2020-05-15 ENCOUNTER — Other Ambulatory Visit: Payer: Self-pay

## 2020-05-15 ENCOUNTER — Ambulatory Visit (INDEPENDENT_AMBULATORY_CARE_PROVIDER_SITE_OTHER): Payer: 59 | Admitting: Family Medicine

## 2020-05-15 ENCOUNTER — Encounter: Payer: Self-pay | Admitting: Family Medicine

## 2020-05-15 VITALS — BP 160/94 | HR 120 | Ht 65.0 in | Wt 170.8 lb

## 2020-05-15 DIAGNOSIS — E039 Hypothyroidism, unspecified: Secondary | ICD-10-CM

## 2020-05-15 DIAGNOSIS — E669 Obesity, unspecified: Secondary | ICD-10-CM | POA: Diagnosis not present

## 2020-05-15 DIAGNOSIS — M1610 Unilateral primary osteoarthritis, unspecified hip: Secondary | ICD-10-CM | POA: Diagnosis not present

## 2020-05-15 DIAGNOSIS — F319 Bipolar disorder, unspecified: Secondary | ICD-10-CM | POA: Diagnosis not present

## 2020-05-15 DIAGNOSIS — E782 Mixed hyperlipidemia: Secondary | ICD-10-CM

## 2020-05-15 DIAGNOSIS — G43109 Migraine with aura, not intractable, without status migrainosus: Secondary | ICD-10-CM

## 2020-05-15 DIAGNOSIS — J321 Chronic frontal sinusitis: Secondary | ICD-10-CM

## 2020-05-15 MED ORDER — TRAMADOL HCL 50 MG PO TABS: 50.0000 mg | ORAL_TABLET | Freq: Three times a day (TID) | ORAL | 0 refills | Status: AC | PRN

## 2020-05-15 MED ORDER — FLUTICASONE PROPIONATE 50 MCG/ACT NA SUSP: 2.0000 | Freq: Every day | NASAL | 6 refills | Status: DC

## 2020-05-15 MED ORDER — RIZATRIPTAN BENZOATE 10 MG PO TABS: 10.0000 mg | ORAL_TABLET | ORAL | 0 refills | Status: DC | PRN

## 2020-05-15 MED ORDER — DULOXETINE HCL 60 MG PO CPEP: 60.0000 mg | ORAL_CAPSULE | Freq: Every day | ORAL | 1 refills | Status: DC

## 2020-05-15 NOTE — Patient Instructions (Signed)
It was a pleasure to see you today!  Thank you for choosing [facility] for your primary care.   Jodi Gilmore was seen for arthritis pain, ENT concerns and migraine.   Our plans for today were:  For your migraine: I have prescribed maxalt to be used as needed. Please follow up with me in the next 1-2 weeks.   For you back pain I have prescribed pain medication to take as needed.   We will check your blood work today for thyroid as well as kidney function due to chronic NSAID use   For your ENT concerns, we will start with flonase and follow up in 1-2 weeks to see how your symptoms are doing.   I have submitted a referral to sports medicine to evaluate your hip and knee pain. Please be on the lookout for a call to schedule an appointment with them.    Best Wishes,   Dr. Neita Garnet

## 2020-05-16 ENCOUNTER — Encounter: Payer: Self-pay | Admitting: Family Medicine

## 2020-05-16 ENCOUNTER — Other Ambulatory Visit: Payer: Self-pay | Admitting: Family Medicine

## 2020-05-16 ENCOUNTER — Telehealth: Payer: Self-pay

## 2020-05-16 LAB — BASIC METABOLIC PANEL
BUN/Creatinine Ratio: 19 (ref 12–28)
BUN: 18 mg/dL (ref 8–27)
CO2: 21 mmol/L (ref 20–29)
Calcium: 10 mg/dL (ref 8.7–10.3)
Chloride: 98 mmol/L (ref 96–106)
Creatinine, Ser: 0.93 mg/dL (ref 0.57–1.00)
Glucose: 84 mg/dL (ref 65–99)
Potassium: 4.4 mmol/L (ref 3.5–5.2)
Sodium: 137 mmol/L (ref 134–144)
eGFR: 69 mL/min/{1.73_m2} (ref >59)

## 2020-05-16 LAB — TSH+FREE T4
Free T4: 1.07 ng/dL (ref 0.82–1.77)
TSH: 13.4 u[IU]/mL — ABNORMAL HIGH (ref 0.450–4.500)

## 2020-05-16 LAB — LDL CHOLESTEROL, DIRECT: LDL Direct: 82 mg/dL (ref 0–99)

## 2020-05-16 MED ORDER — LEVOTHYROXINE SODIUM 150 MCG PO TABS: 150.0000 ug | ORAL_TABLET | ORAL | 3 refills | Status: DC

## 2020-05-16 NOTE — Telephone Encounter (Signed)
Patient calls nurse line regarding thyroid lab results. Patient would like to know how she should adjust her medication per lab results.   Please advise.   Veronda Prude, RN

## 2020-05-17 DIAGNOSIS — J321 Chronic frontal sinusitis: Secondary | ICD-10-CM | POA: Insufficient documentation

## 2020-05-17 DIAGNOSIS — G43109 Migraine with aura, not intractable, without status migrainosus: Secondary | ICD-10-CM | POA: Insufficient documentation

## 2020-05-17 DIAGNOSIS — E785 Hyperlipidemia, unspecified: Secondary | ICD-10-CM | POA: Insufficient documentation

## 2020-05-17 NOTE — Assessment & Plan Note (Addendum)
Chronic issue. Continues to take daily NSAIDS and Tylenol for pain  - prescribe Tramadol PRN  - reviewed Xrays of hip and back with [facility] fellow,Dr. Alfredo Bach who is agreeable with referral to sports medicine for evaluation and consideration of further imaging  - will also increase duloxetine for back pain to 60mg   - check creatinine today as past level was slightly elevated, especially given frequent use of NSAIDS

## 2020-05-17 NOTE — Assessment & Plan Note (Signed)
Patient reports return of migraine symptoms.  Will prescribe maxalt as this worked for patient in the past

## 2020-05-17 NOTE — Assessment & Plan Note (Signed)
Patient currently on statin therapy - will measure direct LDL

## 2020-05-17 NOTE — Assessment & Plan Note (Addendum)
Patient reports that has been worsening over the last two weeks with clear rhinorrhea. No clear fevers/chills.  - continue zyrtec - will add flonase  - will hold off on abx treatment at this time

## 2020-05-17 NOTE — Assessment & Plan Note (Signed)
Chronic issue on geodon.  Patient to follow up with psychiatry on 5/25  Continue geodon and will await any further updates from psychiatry

## 2020-05-17 NOTE — Assessment & Plan Note (Signed)
TSH measured today  Slightly improved from 14 to 13, yet still elevated  Increased synthroid dose to  Will follow up in 6 weeks

## 2020-05-25 NOTE — Progress Notes (Deleted)
    SUBJECTIVE:   CHIEF COMPLAINT / HPI: tachycardia  Tachycardia  ***  PERTINENT  PMH / PSH: ***  OBJECTIVE:   There were no vitals taken for this visit.  ***  ASSESSMENT/PLAN:   No problem-specific Assessment & Plan notes found for this encounter.     Ronnald Ramp, MD [facility]   {    This will disappear when note is signed, click to select method of visit    :1}

## 2020-05-28 ENCOUNTER — Encounter: Payer: Self-pay | Admitting: *Deleted

## 2020-05-29 ENCOUNTER — Ambulatory Visit: Payer: 59 | Admitting: Family Medicine

## 2020-05-29 ENCOUNTER — Other Ambulatory Visit: Payer: Self-pay | Admitting: Family Medicine

## 2020-05-29 ENCOUNTER — Telehealth: Payer: Self-pay

## 2020-05-29 NOTE — Telephone Encounter (Signed)
Patient LVM on nurse line requesting a refill on Tramadol. Patient reports it really helps her pain and allows her to sleep through the night. Will forward to PCP.

## 2020-05-30 ENCOUNTER — Other Ambulatory Visit: Payer: Self-pay | Admitting: Family Medicine

## 2020-06-02 ENCOUNTER — Ambulatory Visit: Payer: 59 | Admitting: Family Medicine

## 2020-06-02 ENCOUNTER — Other Ambulatory Visit: Payer: Self-pay | Admitting: Family Medicine

## 2020-06-02 MED ORDER — TRAMADOL HCL 50 MG PO TABS: 50.0000 mg | ORAL_TABLET | Freq: Three times a day (TID) | ORAL | 0 refills | Status: AC | PRN

## 2020-06-02 NOTE — Progress Notes (Signed)
Patient called requesting refill of tramadol for arthritis pain.  Will send short course to be used as needed, 5 days.  Will need to discuss pain management or pain med contract if patient wishes to continue regimen.   Ronnald Ramp, MD [facility], PGY-2 [phone]

## 2020-06-03 ENCOUNTER — Other Ambulatory Visit: Payer: Self-pay | Admitting: Family Medicine

## 2020-06-04 ENCOUNTER — Encounter (HOSPITAL_COMMUNITY): Payer: Self-pay | Admitting: Physician Assistant

## 2020-06-04 ENCOUNTER — Telehealth (INDEPENDENT_AMBULATORY_CARE_PROVIDER_SITE_OTHER): Payer: 59 | Admitting: Physician Assistant

## 2020-06-04 DIAGNOSIS — F411 Generalized anxiety disorder: Secondary | ICD-10-CM | POA: Insufficient documentation

## 2020-06-04 DIAGNOSIS — F909 Attention-deficit hyperactivity disorder, unspecified type: Secondary | ICD-10-CM | POA: Insufficient documentation

## 2020-06-04 DIAGNOSIS — F3160 Bipolar disorder, current episode mixed, unspecified: Secondary | ICD-10-CM | POA: Diagnosis not present

## 2020-06-04 MED ORDER — FLUOXETINE HCL 20 MG PO CAPS: 20.0000 mg | ORAL_CAPSULE | Freq: Every day | ORAL | 1 refills | Status: DC

## 2020-06-04 MED ORDER — ZIPRASIDONE HCL 40 MG PO CAPS: 40.0000 mg | ORAL_CAPSULE | Freq: Two times a day (BID) | ORAL | 1 refills | Status: DC

## 2020-06-04 MED ORDER — ATOMOXETINE HCL 40 MG PO CAPS: 40.0000 mg | ORAL_CAPSULE | Freq: Every day | ORAL | 1 refills | Status: DC
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
## 2020-06-05 ENCOUNTER — Telehealth (HOSPITAL_COMMUNITY): Payer: Self-pay | Admitting: *Deleted

## 2020-06-05 MED ORDER — DULOXETINE HCL 30 MG PO CPEP: 30.0000 mg | ORAL_CAPSULE | Freq: Every day | ORAL | 0 refills | Status: DC

## 2020-06-05 NOTE — Telephone Encounter (Signed)
Call from patient she is concerned about her Cymbalta, she says she is supposed to be weaning off of it and starting prozac. This is not currently reflected in the chart for me to address with the provider. I will forward this concern to [facility] and have him respond to her, or give orders and I can call her back. She is seeking rx.

## 2020-06-09 ENCOUNTER — Encounter: Payer: Self-pay | Admitting: Family Medicine

## 2020-06-09 NOTE — Telephone Encounter (Signed)
Provider was contacted by Wynona Luna, RN regarding patient's concern over tapering down on Cymbalta. Provider's Cymbalta was e-prescribed to pharmacy of choice so that she could start tapering.

## 2020-06-12 ENCOUNTER — Other Ambulatory Visit: Payer: Self-pay

## 2020-06-12 ENCOUNTER — Encounter: Payer: Self-pay | Admitting: Sports Medicine

## 2020-06-12 ENCOUNTER — Ambulatory Visit (INDEPENDENT_AMBULATORY_CARE_PROVIDER_SITE_OTHER): Payer: 59 | Admitting: Family Medicine

## 2020-06-12 ENCOUNTER — Encounter: Payer: Self-pay | Admitting: Family Medicine

## 2020-06-12 VITALS — BP 134/92 | Ht 65.0 in | Wt 170.0 lb

## 2020-06-12 DIAGNOSIS — M545 Low back pain, unspecified: Secondary | ICD-10-CM | POA: Diagnosis not present

## 2020-06-12 DIAGNOSIS — M1612 Unilateral primary osteoarthritis, left hip: Secondary | ICD-10-CM | POA: Diagnosis not present

## 2020-06-12 MED ORDER — TRAMADOL HCL 50 MG PO TABS: 50.0000 mg | ORAL_TABLET | Freq: Four times a day (QID) | ORAL | 0 refills | Status: DC | PRN

## 2020-06-12 NOTE — Progress Notes (Deleted)
    SUBJECTIVE:   CHIEF COMPLAINT / HPI: f/u tachycardia, dry eyes   Tachycardia Patient was seen on 05/15/20 and noted to have HR of 120 on vitals review. Today she reports ***   Dry Eyes  Patient states that her eyes have been very dry and she does not see the ophthalmologist until July. She reports **   Recently seen by sports medicine for severe hip and back pain with report of new fecal incontinence. MRI was ordered and scheduled for 06/18/20.    PERTINENT  PMH / PSH: ***  OBJECTIVE:   There were no vitals taken for this visit.  General: female appearing stated age in no acute distress HEENT: MMM, no oral lesions noted,Neck non-tender without lymphadenopathy, masses or thyromegaly*** Cardio: Normal S1 and S2, no S3 or S4. Rhythm is regular***. No murmurs or rubs.  Bilateral radial pulses palpable Pulm: Clear to auscultation bilaterally, no crackles, wheezing, or diminished breath sounds. Normal respiratory effort, stable on *** Abdomen: Bowel sounds normal. Abdomen soft and non-tender. *** Extremities: No peripheral edema. Warm/ well perfused. *** Neuro: pt alert and oriented x4    ASSESSMENT/PLAN:   No problem-specific Assessment & Plan notes found for this encounter.     Ronnald Ramp, MD [facility]   {    This will disappear when note is signed, click to select method of visit    :1}

## 2020-06-12 NOTE — Progress Notes (Signed)
Office Visit Note   Patient: Jodi Gilmore           Date of Birth: 1955-02-03           MRN: [id number] Visit Date: 06/12/2020 Requested by: Doreene Eland, MD [address] [place],  [place] [address] PCP: Ronnald Ramp, MD  Subjective: CC: Acute on Chronic low back pain, left hip pain  HPI: 65 year old female with past medical history of multiple autoimmune pathologies who is presenting to clinic with concerns of acute on chronic lower back pain.  Patient says she has struggled with her lower back for many years, though it became much more troublesome approximately a year and a half ago when moving to [place].  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  She says she has never experienced this before, and it has been accompanied by a "sunburn like" pain throughout her back, bilateral gluteal region, and down the posterior aspect of her thighs.  She feels as though her left leg will give out on her, and she is scared that she will fall.  She says she knows she needs to be active, but her pain is too significant. In the past, her back pain has been controlled by laying in bed and lifting her knees-unfortunately she is started to get left groin pain which is stopping this from offering any relief anymore.  She says that her hip pain has insidiously worsened over the past few years as well, but is now greatly overshadowed by the pain in her back. 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  Last MRI was in 2016, and she states her pain was not nearly as bad at that time.  Patient states "I have no quality of life at all.  I have to lay in bed and can barely move due to  all this pain."  Previously, she tried to stay active, and would like to return to walking for exercise if her pain would allow.              Objective: Vital Signs: BP (!) 134/92   Ht 5\' 5"  (1.651 m)   Wt 170 lb (77.1 kg)   BMI 28.29 kg/m  No flowsheet data found.   No flowsheet data found.  Physical Exam:  General:  Alert and oriented, in no acute distress. Pulm:  Breathing unlabored. Psy:  Normal mood, congruent affect. Skin: Lower back without bruises, rashes, or erythema. Overlying skin intact.   BACK EXAMINATION: Walks with a forward lean.  Decreased lumbar lordosis.  No obvious scoliotic curvature.    ROM: Endorses pain with forward flexion, as well as with extension.  States this pain radiates down her legs bilaterally.  No significant pain with rotation.  Hip range of motion: Internal rotation is significantly decreased on the left, which causes deep pain in the groin.  Palpation: Significant lumbar paraspinal tenderness throughout the lower spine, more significant around L3-L5.    Strength:  Hip flexion (L1) 5/5 Bilateral Hip Aduction (L2) 5/5 bilateral Knee Extension (L3) 5/5 bilateral Foot Inversion (L4) 4/5 Left, 5/5 Right Dorsiflexion (L5), 4/5 Left, 5/5 Right Eversion (S1) 4/5 Left, 5/5 Right  Rectal tone  decreased, though sensation is intact.  Sensation: Intact to light touch medial and lateral aspects of lower extremities, and lateral, dorsal, and medial aspects of foot.   Special Tests:  FABER worsens lower back pain. FADIR causes severe groin pain on left.  SLR: Causes radiation down legs and into back bilaterally  Limb Length: Hips Aligned, No obvious discrepancy at medial malleolus      Imaging: CLINICAL DATA:  Chronic back pain.  EXAM: LUMBAR SPINE - COMPLETE 4+ VIEW  COMPARISON:  None.  FINDINGS: Convex rightward lumbar scoliosis evident. There is diffuse loss of disc height in the lumbar spine with associated endplate degenerative  changes. No evidence for lumbar spine fracture or subluxation. Bones are diffusely demineralized. SI joints unremarkable. No worrisome lytic or sclerotic osseous abnormality.  IMPRESSION: Convex rightward lumbar scoliosis with diffuse degenerative disc disease. No acute bony abnormality.   Electronically Signed   By: Kennith Center M.D.   On: 05/09/2020 15:00   CLINICAL DATA:  65 year old female with left lower extremity pain.  EXAM: LEFT KNEE - COMPLETE 4+ VIEW; DG HIP (WITH OR WITHOUT PELVIS) 2-3V LEFT  COMPARISON:  None.  FINDINGS: There is no acute fracture or dislocation. There is severe arthritic changes of the left hip with near complete loss of joint space and bone-on-bone contact superiorly. Minimal arthritic changes of the left knee. No joint effusion. The soft tissues are unremarkable. An 8 mm radiopaque focus over the left pelvis, likely a phlebolith.  IMPRESSION: 1. No acute fracture or dislocation. 2. 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   Electronically Signed   By: Elgie Collard M.D.   On: 05/09/2020 19:15  Assessment & Plan: 65 year old female presenting to clinic with acute on chronic low back pain now accompanied by bowel and bladder dysfunction in the past 72 hours.  Additionally, patient with severe left hip OA causing deep pain in her groin.  Her back pain is symptomatically far overshadowing her hip pain, and we will focus on this primarily today.  Due to her new onset of fecal incontinence, rectal exam was performed which did demonstrate a decreased rectal tone, although sensation was reassuringly intact. -We will order an expedited MRI of the lumbar spine to better evaluate for any acute pathology.  -I am leaving early next week, patient was handed off to covering provider to assure prompt read on MRI if acute actions are necessary. -We will refill tramadol, as patient states this is offered significant improvement in her pain thus  far.  Per her hip, if her lumbar MRI does not demonstrate any obvious surgical pathology I suspect she would benefit from a hip injection.  We will consider this in the future pending her imaging results.  She expresses understanding.  She has no further questions or concerns today.   I was a preceptor for this visit and available for immediate consultation Marsa Aris, DO

## 2020-06-13 ENCOUNTER — Ambulatory Visit: Payer: 59 | Admitting: Family Medicine

## 2020-06-18 ENCOUNTER — Other Ambulatory Visit: Payer: 59

## 2020-06-19 ENCOUNTER — Other Ambulatory Visit: Payer: Self-pay

## 2020-06-19 ENCOUNTER — Ambulatory Visit
Admission: RE | Admit: 2020-06-19 | Discharge: 2020-06-19 | Disposition: A | Discharge status: Home / Self Care | Payer: 59 | Source: Ambulatory Visit | Attending: Sports Medicine | Admitting: Sports Medicine

## 2020-06-19 DIAGNOSIS — M545 Low back pain, unspecified: Secondary | ICD-10-CM

## 2020-06-19 IMAGING — MR MR LUMBAR SPINE W/O CM
4 of 5 series · 27 of 48 positions shown · non-contrast
Comparison: Plain films lumbar spine [DATE]

CLINICAL DATA: Bilateral low back pain.

EXAM:
MRI LUMBAR SPINE WITHOUT CONTRAST
TECHNIQUE: Multiplanar, multisequence MR imaging of the lumbar spine was
performed. No intravenous contrast was administered.

[Series 3: T2 · sagittal · 4.0mm · 1.09mm/px · 7 of 17 slices shown (1 of 2)]
[im 1/17]
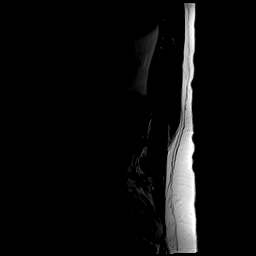
[im 3/17]
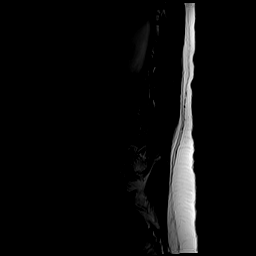
[im 6/17]
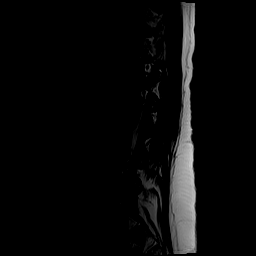
[im 9/17]
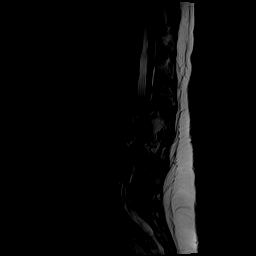
[im 11/17]
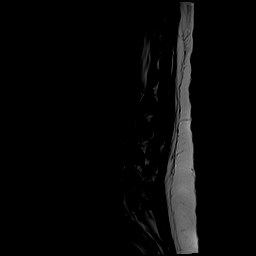
[im 14/17]
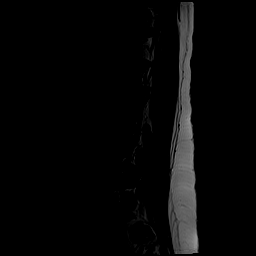
[im 17/17]
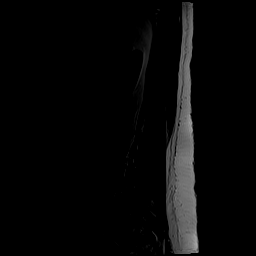

[Series 5: T1 · sagittal · 4.0mm · 1.09mm/px · 6 of 17 slices shown (1 of 2)]
[im 1/17]
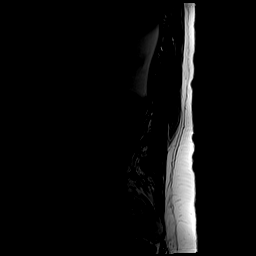
[im 4/17]
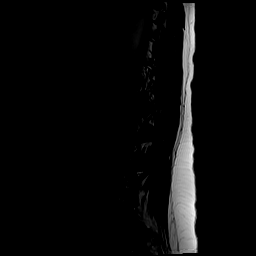
[im 7/17]
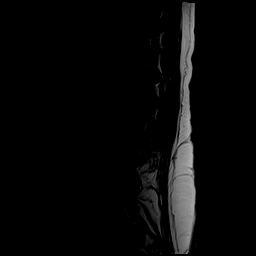
[im 10/17]
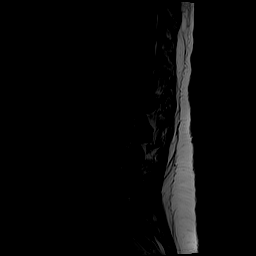
[im 13/17]
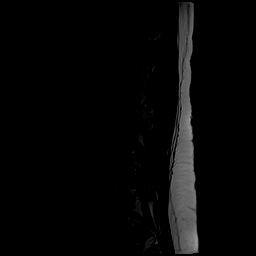
[im 17/17]
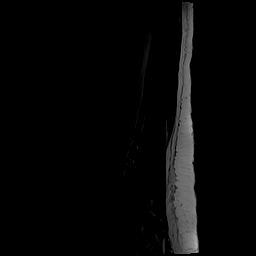

[Series 6: T2 · axial · 4.0mm · 0.39mm/px · z∈[-27,+157]mm · 8 of 38 slices shown (2 of 2)]
[im 1/38]
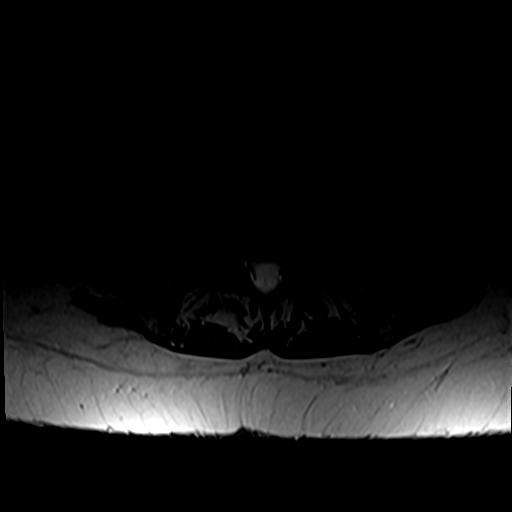
[im 6/38]
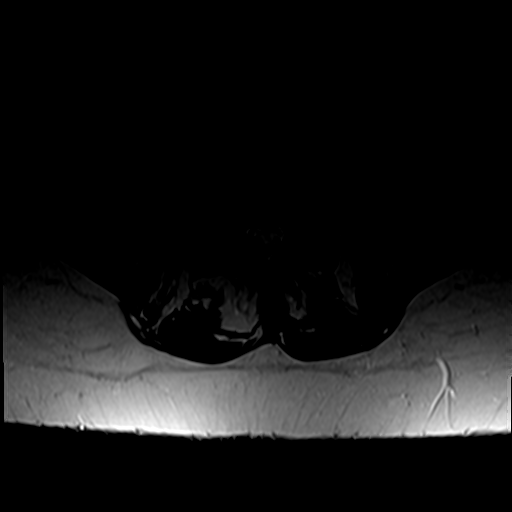
[im 12/38]
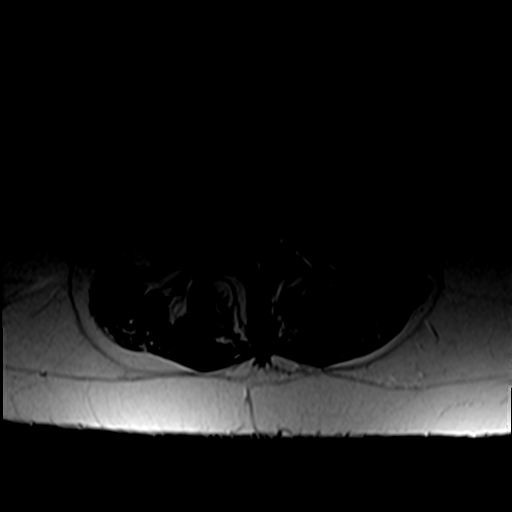
[im 18/38]
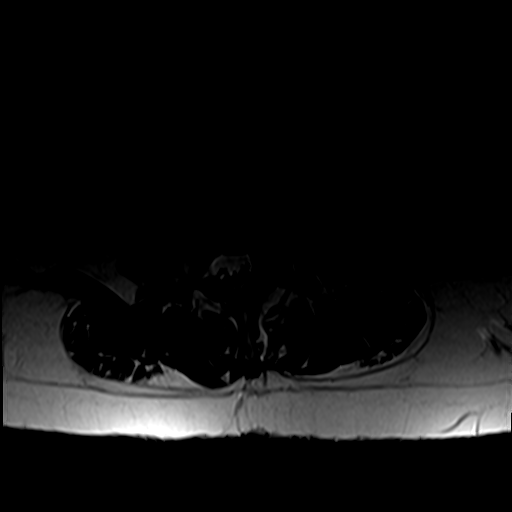
[im 20/38]
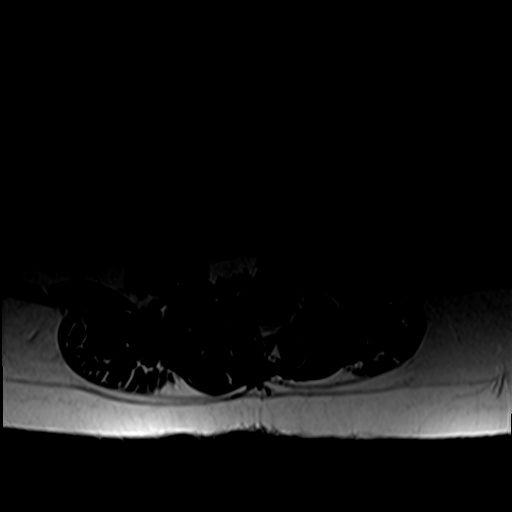
[im 26/38]
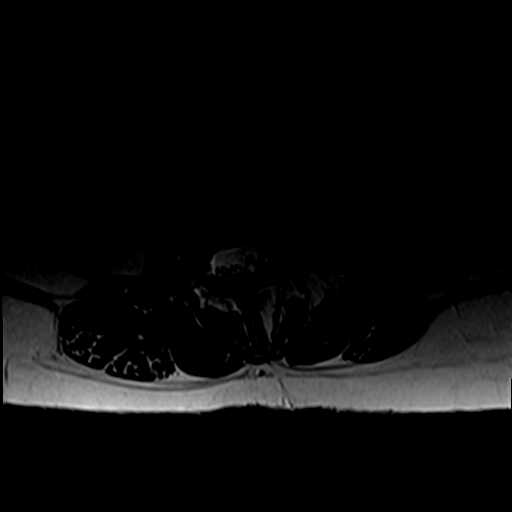
[im 32/38]
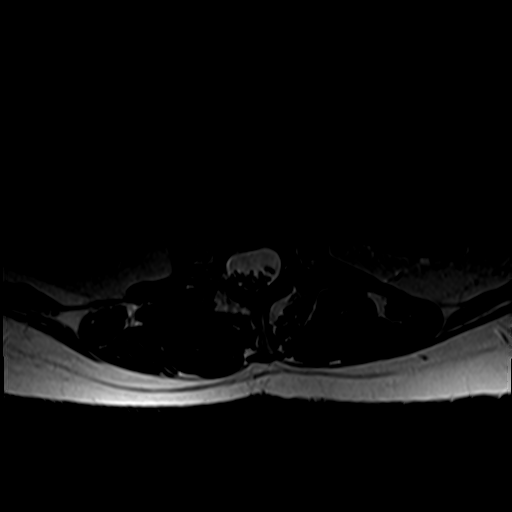
[im 38/38]
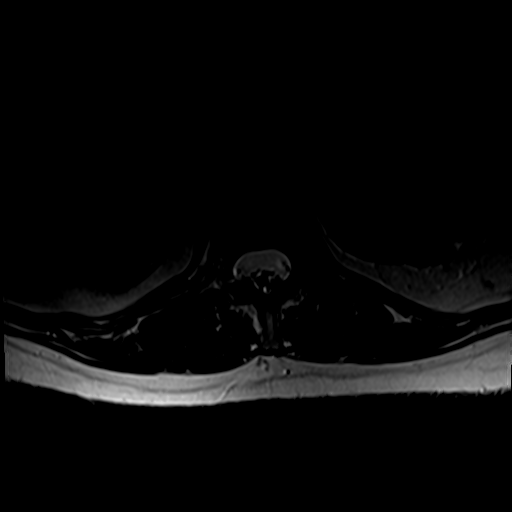

[Series 7: T1 · axial · 4.0mm · 0.39mm/px · z∈[-27,+128]mm · 6 of 38 slices shown (2 of 2)]
[im 1/38]
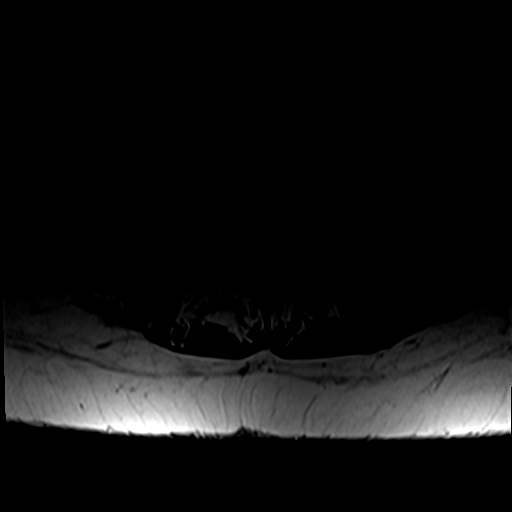
[im 6/38]
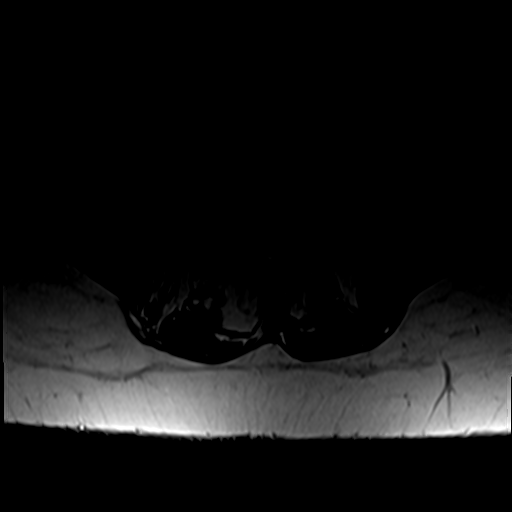
[im 12/38]
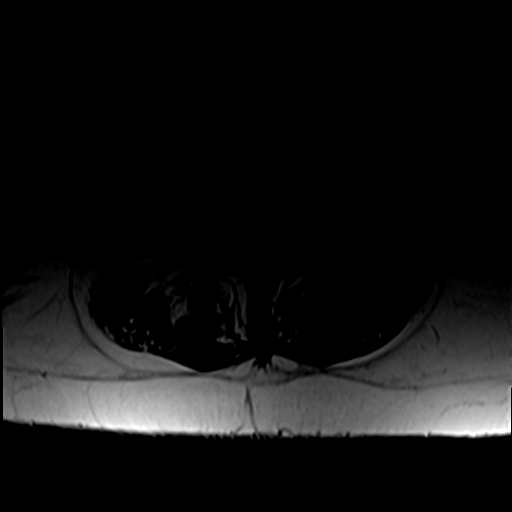
[im 18/38]
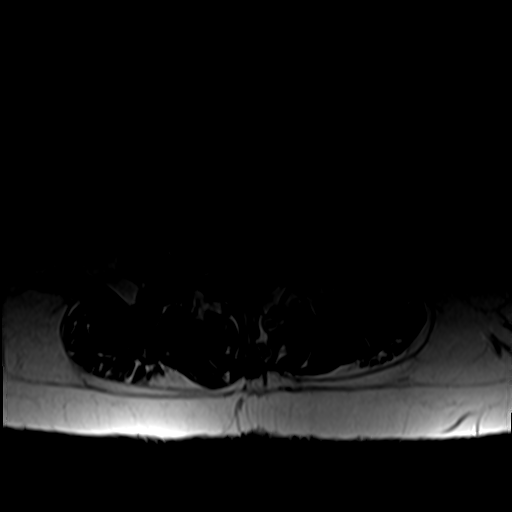
[im 20/38]
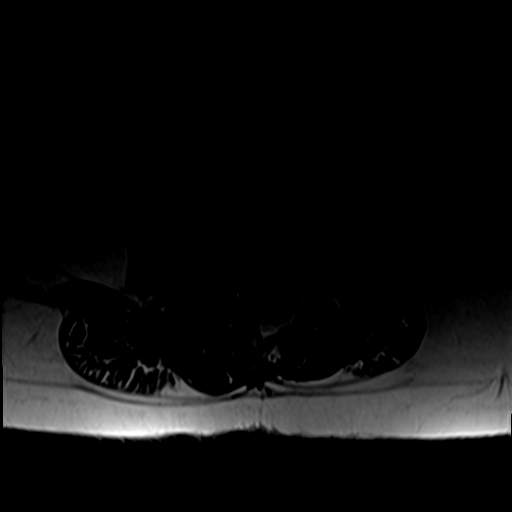
[im 32/38]
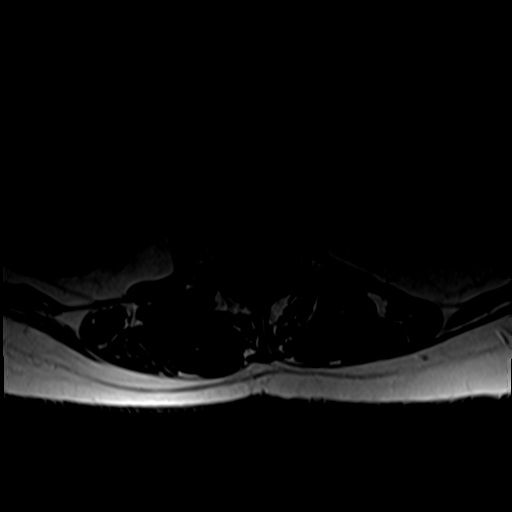

[27 of 48 positions shown; findings below may reference images not displayed]

FINDINGS: Segmentation:  Standard.

Alignment: Convex right scoliosis with the apex at L2-3. There is
straightening of the normal lumbar lordosis.

Vertebrae: No fracture, evidence of discitis, or bone lesion. Marked
multilevel degenerative endplate signal change.

Conus medullaris and cauda equina: Conus extends to the T12 level.
Conus and cauda equina appear normal.

Paraspinal and other soft tissues: Negative.

Disc levels:

The T10-11 and T11-12 are imaged in the sagittal plane only and
negative.

T12-L1: Negative.

L1-2: The patient has right and left paracentral disc protrusions,
larger on the right. Narrowing in the subarticular recesses is worse
on the right. The foramina appear open.

L2-3: Shallow left subarticular recess and foraminal protrusion.
There is mild narrowing in the left subarticular recess and moderate
left foraminal narrowing. The right foramen is open.

L3-4: Shallow disc bulge. There is a left subarticular recess
protrusion extending into the left foramen. The protrusion could
impact the descending left L4 root. There is mild to moderate left
foraminal narrowing. The right foramen is open.

L4-5: Disc bulge and mild-to-moderate facet degenerative change.
There is moderate central canal stenosis. Moderately severe left and
mild-to-moderate right foraminal narrowing.

L5-S1: Shallow disc bulge and a right foraminal protrusion. The
central canal and left foramen are open. Mild to moderate right
foraminal narrowing.
IMPRESSION: Convex right scoliosis.

Right greater than left subarticular recess narrowing at L1-2 due to
bilateral disc protrusions.

Mild left subarticular recess and moderate left foraminal narrowing
at L2-3.

Left subarticular recess and foraminal protrusion at L3-4 could
impact the descending left L4 root and causes mild to moderate left
foraminal narrowing.

Moderately severe left and mild-to-moderate right foraminal
narrowing at L4-5 where there is moderate central canal stenosis.

Mild to moderate right foraminal narrowing L5-S1. The central canal
and left foramen are open.

## 2020-06-20 ENCOUNTER — Telehealth: Payer: Self-pay

## 2020-06-20 ENCOUNTER — Other Ambulatory Visit: Payer: Self-pay

## 2020-06-20 NOTE — Telephone Encounter (Signed)
Patient calls nurse line requesting [facility] services for upcoming appointment on 6/14.  Called [employer] and scheduled ride for 6/14 at 12:50.  They will call patient to go over details and go over rider waiver.   Veronda Prude, RN

## 2020-06-23 ENCOUNTER — Other Ambulatory Visit: Payer: Self-pay | Admitting: *Deleted

## 2020-06-23 ENCOUNTER — Other Ambulatory Visit: Payer: Self-pay

## 2020-06-23 DIAGNOSIS — M545 Low back pain, unspecified: Secondary | ICD-10-CM

## 2020-06-23 MED ORDER — TRAMADOL HCL 50 MG PO TABS: 50.0000 mg | ORAL_TABLET | Freq: Four times a day (QID) | ORAL | 0 refills | Status: DC | PRN

## 2020-06-23 NOTE — Progress Notes (Signed)
Dr. Marga Hoots spoke with pt regarding MRI results. Will proceed with referral to [facility], tramadol refill and pt would like to try PT as well. Referral placed to [facility].

## 2020-06-24 ENCOUNTER — Other Ambulatory Visit: Payer: Self-pay

## 2020-06-24 ENCOUNTER — Ambulatory Visit (INDEPENDENT_AMBULATORY_CARE_PROVIDER_SITE_OTHER): Payer: 59 | Admitting: Family Medicine

## 2020-06-24 ENCOUNTER — Encounter: Payer: Self-pay | Admitting: Family Medicine

## 2020-06-24 ENCOUNTER — Ambulatory Visit (INDEPENDENT_AMBULATORY_CARE_PROVIDER_SITE_OTHER): Payer: 59

## 2020-06-24 VITALS — BP 132/76 | HR 114 | Wt 171.0 lb

## 2020-06-24 DIAGNOSIS — Z0000 Encounter for general adult medical examination without abnormal findings: Secondary | ICD-10-CM

## 2020-06-24 DIAGNOSIS — E039 Hypothyroidism, unspecified: Secondary | ICD-10-CM | POA: Diagnosis not present

## 2020-06-24 DIAGNOSIS — Z23 Encounter for immunization: Secondary | ICD-10-CM | POA: Diagnosis not present

## 2020-06-24 DIAGNOSIS — A609 Anogenital herpesviral infection, unspecified: Secondary | ICD-10-CM

## 2020-06-24 MED ORDER — LISINOPRIL 2.5 MG PO TABS: 2.5000 mg | ORAL_TABLET | Freq: Every day | ORAL | 3 refills | Status: DC

## 2020-06-24 MED ORDER — VALACYCLOVIR HCL 500 MG PO TABS: 500.0000 mg | ORAL_TABLET | Freq: Two times a day (BID) | ORAL | 2 refills | Status: DC

## 2020-06-24 NOTE — Patient Instructions (Addendum)
We will collect blood work to test your thyroid levels. I will notify you of abnormal results.    We have scheduled you for Thursday, July 21 at 1:30 PM for your appointment with Dr. Gerilyn Pilgrim to discuss nutrition.  This appointment will be here in the family medicine center.  For the area on your left buttock, I have prescribed Valtrex.  Please take this twice daily for 7 days.  I have also given refills in case she needed for any future outbreak.       Psychiatry Resource List (Adults and Children) Most of these providers will take Medicaid. please consult your insurance for a complete and updated list of available providers. When calling to make an appointment have your insurance information available to confirm you are covered.   [facility]:Psychiatry and Counseling [address] [place], [place] [address] [phone]  [facility]  [address] [place], [place] [phone] Crisis [phone]   [facility]:   [facility]: [address]     [phone]   [facility]: [address] [place]. [place],        [phone] [place]: [address] [phone],    [phone] [place]: [phone] [address],                   [phone] Children: [facility] [address]         [phone]  [facility] (virtual only) [phone]    [facility]  (Psychiatry only; Adults /children 12 and over, will take Medicaid)  [address] [place] [address], [place], [place] [address]       [phone]   [facility] (Psychiatry & counseling ; adults & children ; will take Medicaid [address]  [place] [address]  Go on-line to complete referral ( [email or web address]://[email or web address].[email or web address]/en/make-[email or web address]-referral [phone]    (Spanish speaking therapists)  [facility]  Psychiatry & counseling; Adults and children;  Call  Registration prior to scheduling an appointment [phone] [address]    [place], [place] [address]    [phone]  [facility] (Psychiatry & counseling; adults & children; Medicare no Medicaid)  [address]   [place], [place]  [address]      [phone]    [facility] (up to age 73)  Psychiatry & counseling ,will take Medicaid, must do counseling to receive psychiatry services  [address] [place] [address]        [phone]  [facility] (Psychiatry & counseling; adults & children; will take Medicaid) Will need a referral from provider [address],  [place], [place]  [phone]   [facility] --- Walk-In Mon-Friday 8am-3pm ( will take Medicaid, Psychiatry, Adults & children,  [address] [place], [place]   [phone]   [facility], Walk-in M-F 8am-12pm and 1pm -3pm   (Counseling, Psychiatry, will take Medicaid, adults & children)  [address] [place], [place]  [phone]

## 2020-06-24 NOTE — Progress Notes (Signed)
    SUBJECTIVE:   CHIEF COMPLAINT / HPI: Ulcer on buttocks, therapies for psychiatry, thyroid levels, COVID booster  Healthcare maintenance Patient states that she would like to have her COVID booster vaccine.  Hypothyroidism Patient states that she would like to have her thyroid levels checked again if she is continuing to have itching, she is reporting hair loss as well as temperature instability.  She is concerned that her thyroid levels may be abnormal.  Buttocks sore Patient also states that she has had a "sore" on her left buttock that has been going on almost 2 weeks.  She reports that it is tender, she states that she has a history of Behcet's and thinks that it may be also related to that or a flare of HSV.  She reports that she was diagnosed with HSV in the past.  She has not had a flare in several years.  She also reports having an area that has lasted for a while on the left side of her lower lip.  She states that she has been trying to use witch hazel wipes that with the tenderness with little relief of pain.  She states that she has never had shingles nor has she had the Shingrix vaccine.    PERTINENT  PMH / PSH:  Hypothyroidism  Migraine  OBJECTIVE:   BP 132/76   Pulse (!) 114   Wt 171 lb (77.6 kg)   SpO2 96%   BMI 28.46 kg/m   General: female appearing stated age in no acute distress Cardio: Normal S1 and S2, no S3 or S4. Rhythm is regular, HR slightly elevated. No murmurs or rubs.  Bilateral radial pulses palpable Pulm: Clear to auscultation bilaterally, no crackles, wheezing, or diminished breath sounds. Normal respiratory effort, stable on RA Skin: Patient with shallow 1 cm in diameter ulcer on inner aspect of left buttocks, surrounding erythema, no dermatomal pattern rash, no signs of hemorrhoids external  ASSESSMENT/PLAN:   Healthcare maintenance Advised patient to schedule an appt for COVID booster    Hypothyroidism (acquired) Patient specifically  requests TSH, T3 and T4 levels given her reported symptoms of hair loss, pruritis.  TSH,T3 and T4 collected   HSV (herpes simplex virus) anogenital infection Patient reports hx of HSV and has not had a flare in several years.  Will treat with valtrex for 7 days      Ronnald Ramp, MD [facility]

## 2020-06-25 LAB — T3, FREE: T3, Free: 5 pg/mL — ABNORMAL HIGH (ref 2.0–4.4)

## 2020-06-25 LAB — T4, FREE: Free T4: 2.36 ng/dL — ABNORMAL HIGH (ref 0.82–1.77)

## 2020-06-25 LAB — TSH: TSH: 0.035 u[IU]/mL — ABNORMAL LOW (ref 0.450–4.500)

## 2020-06-26 DIAGNOSIS — A609 Anogenital herpesviral infection, unspecified: Secondary | ICD-10-CM | POA: Insufficient documentation

## 2020-06-26 NOTE — Assessment & Plan Note (Signed)
Patient reports hx of HSV and has not had a flare in several years.  Will treat with valtrex for 7 days

## 2020-06-26 NOTE — Assessment & Plan Note (Signed)
Advised patient to schedule an appt for COVID booster

## 2020-06-26 NOTE — Assessment & Plan Note (Signed)
Patient specifically requests TSH, T3 and T4 levels given her reported symptoms of hair loss, pruritis.  TSH,T3 and T4 collected

## 2020-06-26 NOTE — Progress Notes (Signed)
[facility] Dr Marikay Alar 6.21.22 @ 4p [address] [place] [address] [phone]

## 2020-06-30 ENCOUNTER — Other Ambulatory Visit: Payer: Self-pay | Admitting: Family Medicine

## 2020-06-30 DIAGNOSIS — R635 Abnormal weight gain: Secondary | ICD-10-CM

## 2020-07-01 ENCOUNTER — Encounter: Payer: Self-pay | Admitting: Family Medicine

## 2020-07-02 ENCOUNTER — Other Ambulatory Visit: Payer: Self-pay | Admitting: Family Medicine

## 2020-07-02 DIAGNOSIS — E039 Hypothyroidism, unspecified: Secondary | ICD-10-CM

## 2020-07-02 MED ORDER — LEVOTHYROXINE SODIUM 137 MCG PO TABS: 150.0000 ug | ORAL_TABLET | ORAL | 1 refills | Status: DC

## 2020-07-03 ENCOUNTER — Ambulatory Visit: Payer: 59

## 2020-07-03 ENCOUNTER — Other Ambulatory Visit: Payer: Self-pay | Admitting: Family Medicine

## 2020-07-03 ENCOUNTER — Telehealth: Payer: Self-pay

## 2020-07-03 ENCOUNTER — Other Ambulatory Visit: Payer: Self-pay

## 2020-07-03 MED ORDER — TRAMADOL HCL 50 MG PO TABS: 50.0000 mg | ORAL_TABLET | Freq: Four times a day (QID) | ORAL | 0 refills | Status: DC | PRN

## 2020-07-03 MED ORDER — RIZATRIPTAN BENZOATE 10 MG PO TABS: ORAL_TABLET | ORAL | 1 refills | Status: DC

## 2020-07-03 NOTE — Telephone Encounter (Signed)
Patient calls nurse line stating she needs a prescription for maxalt rapid tabs dissolvable #12. Patient reports she checked with her insurance and this is what they are willing to cover. Please send in to preferred pharmacy.   Patient reports she did just get the previous prescription, however if her insurance does not pay so soon the pharmacy will hold rx on file.

## 2020-07-03 NOTE — Telephone Encounter (Signed)
Maxalt Rx sent for 12 count per patient request.

## 2020-07-03 NOTE — Addendum Note (Signed)
Addended by: Lenda Kelp on: 07/03/2020 02:04 PM   Modules accepted: Orders

## 2020-07-03 NOTE — Progress Notes (Signed)
Pt called asking for a tramadol refill.  Sent to pharmacy.

## 2020-07-08 ENCOUNTER — Ambulatory Visit: Payer: 59 | Admitting: Physical Therapy

## 2020-07-15 ENCOUNTER — Telehealth (INDEPENDENT_AMBULATORY_CARE_PROVIDER_SITE_OTHER): Payer: 59 | Admitting: Physician Assistant

## 2020-07-15 ENCOUNTER — Encounter (HOSPITAL_COMMUNITY): Payer: Self-pay | Admitting: Physician Assistant

## 2020-07-15 ENCOUNTER — Other Ambulatory Visit: Payer: Self-pay

## 2020-07-15 DIAGNOSIS — F411 Generalized anxiety disorder: Secondary | ICD-10-CM

## 2020-07-15 DIAGNOSIS — F3160 Bipolar disorder, current episode mixed, unspecified: Secondary | ICD-10-CM

## 2020-07-15 DIAGNOSIS — F909 Attention-deficit hyperactivity disorder, unspecified type: Secondary | ICD-10-CM

## 2020-07-15 MED ORDER — ZIPRASIDONE HCL 40 MG PO CAPS: 40.0000 mg | ORAL_CAPSULE | Freq: Two times a day (BID) | ORAL | 1 refills | Status: DC

## 2020-07-15 MED ORDER — ATOMOXETINE HCL 60 MG PO CAPS: 60.0000 mg | ORAL_CAPSULE | Freq: Every day | ORAL | 1 refills | Status: DC

## 2020-07-15 MED ORDER — FLUOXETINE HCL 20 MG PO CAPS: 20.0000 mg | ORAL_CAPSULE | Freq: Every day | ORAL | 1 refills | Status: DC

## 2020-07-15 NOTE — Progress Notes (Signed)
[place] MD/PA/NP OP Progress Note  Virtual Visit via Telephone Note  I connected with Jodi Gilmore on 07/15/20 at  4:30 PM EDT by telephone and verified that I am speaking with the correct person using two identifiers.  Location: Patient: Home Provider: Clinic   I discussed the limitations, risks, security and privacy concerns of performing an evaluation and management service by telephone and the availability of in person appointments. I also discussed with the patient that there may be a patient responsible charge related to this service. The patient expressed understanding and agreed to proceed.  Follow Up Instructions:  I discussed the assessment and treatment plan with the patient. The patient was provided an opportunity to ask questions and all were answered. The patient agreed with the plan and demonstrated an understanding of the instructions.   The patient was advised to call back or seek an in-person evaluation if the symptoms worsen or if the condition fails to improve as anticipated.  I provided 25 minutes of non-face-to-face time during this encounter.  Jodi Mood, PA    07/15/2020 10:35 PM Jodi Gilmore  MRN:  [id number]  Chief Complaint: Follow up and medication management  HPI:   Jodi Gilmore is a 65 year old female with a past psychiatric history significant for bipolar disorder, attention deficit hyperactivity disorder, and generalized anxiety disorder who presents to [facility] via virtual telephone visit for follow-up and medication management.  Patient is currently being managed on the following medications:  Ziprasidone 40 mg 2 times daily Fluoxetine 20 mg daily Atomoxetine 40 mg daily  Patient reports that since taking her ziprasidone, her heartburn has gotten worse.  She reports that her Christianne Borrow has done nothing for the management of her focus and concentration.  She denies any major changes to her depressive  symptoms since taking fluoxetine but states that she would like to give the medication more time since she has recently started taking it.  Patient reports that she has been evaluated for ADHD in the past.  She reports that she was diagnosed 30 years ago.  Patient reports that she is easily distracted and is unable to focus and retain information.  She also expresses forgetfulness.  Patient states that it is very difficult for her to read material and take notes without the use of medication aiding in her focus and information retention.  She reports that in the past, she has been a much better employee when taking Adderall.  Patient is currently not working.  Patient reports that she has been a lot of chronic pain and has recently been seen by a neurosurgeon to discuss surgery options.  Patient has also been evaluated by physical therapy.  Patient reports that she has been sleeping a lot and experiencing a lot of headaches and changes in appetite.  Patient reports that she has an appointment with a nutritionist coming up.  A PHQ-9 screen was performed with the patient scoring a 17.  A GAD-7 screen was also performed with the patient scoring an 8.  Patient is calm, cooperative, and fully engaged in conversation during the encounter.  Patient endorses very low Gilmore and reports feeling classically depressed.  Patient denies suicidal or homicidal ideations.  She further denies auditory or visual hallucinations and does not appear to be responding to internal/external stimuli.  Patient endorses poor sleep and attributes her quality of sleep due to the constant pain she is in.  Patient endorses receiving on average 5 to 6 hours of  sleep but states that she will often wake up between the hours of 1 AM and 3 AM due to her pain.  Patient endorses fair appetite and eats roughly 5-6 smaller meals throughout the day.  Patient denies alcohol use, tobacco use, and illicit drug use.  Visit Diagnosis:    ICD-10-CM   1.  Bipolar affective disorder, current episode mixed, current episode severity unspecified ([place])  F31.60 ziprasidone (GEODON) 40 MG capsule    FLUoxetine (PROZAC) 20 MG capsule    2. Attention deficit hyperactivity disorder (ADHD), unspecified ADHD type  F90.9 atomoxetine (STRATTERA) 60 MG capsule    3. Generalized anxiety disorder  F41.1 FLUoxetine (PROZAC) 20 MG capsule      Past Psychiatric History:  Bipolar Disorder ADHD Generalized anxiety disorder Social anxiety  Past Medical History:  Past Medical History:  Diagnosis Date   Allergy    Anxiety    situational    Asthma    yeras ago - not current    Behcet's disease ([place])    Bell's palsy    Cataract    both removed    Depression    situational -    Diplopia    Elevated blood pressure reading    no BP meds currently    GERD (gastroesophageal reflux disease)    Graves disease 1991   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    Hip pain    HLA B27 (HLA B27 positive)    Hyperlipidemia    Knee pain    Memory changes    Morphea scleroderma    Neuromuscular disorder (HCC)    Raynauds    Psoriatic arthritis (HCC)    seen in ears only   Raynaud's disease    Sjogren syndrome with dental involvement     Past Surgical History:  Procedure Laterality Date   laproscopy     SALPINGECTOMY  1980   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      Family Psychiatric History:  Nephew - Schizophrenia Father - patient suspects that her father may be bipolar  Family History:  Family History  Problem Relation Age of Onset   Colon cancer Neg Hx    Colon polyps Neg Hx    Esophageal cancer Neg Hx    Stomach cancer Neg Hx    Rectal cancer Neg Hx     Social History:  Social History   Socioeconomic History   Marital status: Single    Spouse name: Not on file   Number of children: Not on file   Years of education: Not on file   Highest education level: Not on file  Occupational History   Not on file   Tobacco Use   Smoking status: Former    Pack years: 0.00   Smokeless tobacco: Never  Substance and Sexual Activity   Alcohol use: Not Currently   Drug use: Never   Sexual activity: Not on file  Other Topics Concern   Not on file  Social History Narrative   Not on file   Social Determinants of Health   Financial Resource Strain: Not on file  Food Insecurity: Not on file  Transportation Needs: Not on file  Physical Activity: Not on file  Stress: Not on file  Social Connections: Not on file    Allergies:  Allergies  Allergen Reactions   Contrast Media [Iodinated Diagnostic Agents] Anaphylaxis   Prohance [Gadoteridol] Anaphylaxis   Latex    Ptu [Propylthiouracil]  Sulfa Antibiotics    Tapazole [Methimazole]     Metabolic Disorder Labs: Lab Results  Component Value Date   HGBA1C 5.2 01/01/2020   No results found for: PROLACTIN Lab Results  Component Value Date   CHOL 272 (H) 02/12/2020   TRIG 145 02/12/2020   HDL 71 02/12/2020   CHOLHDL 3.8 02/12/2020   LDLCALC 175 (H) 02/12/2020   Lab Results  Component Value Date   TSH 0.035 (L) 06/24/2020   TSH 13.400 (H) 05/15/2020    Therapeutic Level Labs: No results found for: LITHIUM No results found for: VALPROATE No components found for:  CBMZ  Current Medications: Current Outpatient Medications  Medication Sig Dispense Refill   Acetaminophen (TYLENOL EXTRA STRENGTH PO) Take by mouth. 650 mg tabs up to 6 a day     atomoxetine (STRATTERA) 60 MG capsule Take 1 capsule (60 mg total) by mouth daily. 30 capsule 1   atorvastatin (LIPITOR) 40 MG tablet Take 1 tablet (40 mg total) by mouth daily. 90 tablet 3   B Complex Vitamins (VITAMIN B COMPLEX PO) Take 1 tablet by mouth daily.     Bacillus Coagulans-Inulin (PROBIOTIC-PREBIOTIC PO) Take 1 capsule by mouth daily.     cetirizine (ZYRTEC ALLERGY) 10 MG tablet Take 1 tablet (10 mg total) by mouth daily. 90 tablet 1   Cholecalciferol (VITAMIN D) 125 MCG (5000 UT)  CAPS Take 10,000 Units by mouth daily.     diphenhydrAMINE (BENADRYL) 25 MG tablet Take 50 mg by mouth every 6 (six) hours as needed for allergies.     FEVERFEW PO Take 1 tablet by mouth 3 (three) times daily.     FLUoxetine (PROZAC) 20 MG capsule Take 1 capsule (20 mg total) by mouth daily. 30 capsule 1   fluticasone (FLONASE) 50 MCG/ACT nasal spray Place 2 sprays into both nostrils daily. 16 g 6   glucosamine-chondroitin 500-400 MG tablet Take 1 tablet by mouth 3 (three) times daily.     ibuprofen (ADVIL) 600 MG tablet Take 1,200 mg by mouth 3 (three) times daily. 1200 mg TID  she takes 6-200 mg tabs  tid     levothyroxine (SYNTHROID) 137 MCG tablet Take 1 tablet (137 mcg total) by mouth every morning. 30 minutes before food 60 tablet 1   lisinopril (ZESTRIL) 2.5 MG tablet Take 1 tablet (2.5 mg total) by mouth at bedtime. 90 tablet 3   Magnesium Oxide (MAG-OX 400 PO) Take 1 tablet by mouth 2 times daily at 12 noon and 4 pm.     Multiple Vitamin (MULTIVITAMIN) tablet Take 1 tablet by mouth daily.     Omega-3 Fatty Acids (OMEGA-3 EPA FISH OIL PO) Take 3 capsules by mouth daily.     omeprazole (PRILOSEC) 20 MG capsule Take 1 capsule (20 mg total) by mouth in the morning and at bedtime. 90 capsule 0   OVER THE COUNTER MEDICATION Homeopathic meds  daily     Potassium Bicarbonate 99 MG CAPS Take 1 capsule by mouth 3 (three) times daily.     pseudoephedrine (SUDAFED) 30 MG tablet Take 30 mg by mouth every 4 (four) hours as needed for congestion.     RESTASIS 0.05 % ophthalmic emulsion INSTILL ONE DROP INTO BOTH EYES TWICE A DAY 60 each 3   rizatriptan (MAXALT) 10 MG tablet TAKE 1 TABLET BY MOUTH AS NEEDED FOR MIGRAINE, MAY REPEAT IN TWO HOURS IF NEEDED 12 tablet 1   traMADol (ULTRAM) 50 MG tablet Take 1 tablet (50 mg total) by  mouth every 6 (six) hours as needed for severe pain. 30 tablet 0   TURMERIC PO Take by mouth.     ziprasidone (GEODON) 40 MG capsule Take 1 capsule (40 mg total) by mouth 2  (two) times daily with a meal. 60 capsule 1   No current facility-administered medications for this visit.     Musculoskeletal: Strength & Muscle Tone: Unable to assess due to telemedicine visit [place]: Unable to assess due to telemedicine visit Patient leans: Unable to assess due to telemedicine visit  Psychiatric Specialty Exam: Review of Systems  Psychiatric/Behavioral:  Positive for decreased concentration and sleep disturbance. Negative for dysphoric Gilmore, hallucinations, self-injury and suicidal ideas. The patient is nervous/anxious. The patient is not hyperactive.    There were no vitals taken for this visit.There is no height or weight on file to calculate BMI.  General Appearance: Unable to assess due to telemedicine visit  Eye Contact:  Unable to assess due to telemedicine visit  Speech:  Clear and Coherent and Normal Rate  Volume:  Normal  Gilmore:  Anxious and Depressed  Affect:  Congruent and Depressed  Thought Process:  Coherent and Descriptions of Associations: Intact  Orientation:  Full (Time, Place, and Person)  Thought Content: WDL and Logical   Suicidal Thoughts:  No  Homicidal Thoughts:  No  Memory:  Immediate;   Fair Recent;   Fair Remote;   Fair  Judgement:  Good  Insight:  Good  Psychomotor Activity:  Normal  Concentration:  Concentration: Good and Attention Span: Good  Recall:  Good  Fund of Knowledge: Good  Language: Good  Akathisia:  NA  Handed:  Right  AIMS (if indicated): not done  Assets:  Communication Skills Desire for Improvement Housing  ADL's:  Intact  Cognition: WNL  Sleep:  Fair   Screenings: GAD-7    Flowsheet Row Video Visit from 07/15/2020 in [facility] Video Visit from 06/04/2020 in [facility]  Total GAD-7 Score 8 9      PHQ2-9    Flowsheet Row Video Visit from 07/15/2020 in [facility] Office Visit from 06/24/2020 in [place] Video Visit from 06/04/2020 in [facility] Office Visit from 05/15/2020 in [place] Office Visit from 02/12/2020 in [place]  PHQ-2 Total Score _0 PHQ-9 Total Score _1 Flowsheet Row Video Visit from 07/15/2020 in [facility] Video Visit from 06/04/2020 in [place]        Assessment and Plan:   Jodi Gilmore is a 65 year old female with a past psychiatric history significant for bipolar disorder, attention deficit hyperactivity disorder, and generalized anxiety disorder who presents to [facility] via virtual telephone visit for follow-up and medication management.  Patient reports that her medications have not been helpful in the management of her psychiatric symptoms.  Patient reports that she has been experiencing heartburn through the use of her ziprasidone.  Patient was encouraged to take her ziprasidone with food and patient was advised to stay upright for 2 to 3 hours after eating to avoid experiencing heartburn.  Patient was recommended increasing her Strattera dosage from 40 mg to 60 mg daily for the management of her focus and concentration.  Patient  was agreeable to recommendation.  Patient would like to continue taking the same dosage of fluoxetine to see if she experiences improvements in her depression.  Patient's medications to be e-prescribed to pharmacy of choice.  1. Bipolar affective disorder, current episode mixed, current episode severity unspecified ([place])  - ziprasidone (GEODON) 40 MG capsule; Take 1 capsule (40 mg total) by mouth 2 (two) times daily with a meal.  Dispense: 60 capsule; Refill: 1 - FLUoxetine (PROZAC) 20 MG capsule; Take 1 capsule (20 mg total) by mouth daily.  Dispense: 30 capsule; Refill: 1  2. Attention  deficit hyperactivity disorder (ADHD), unspecified ADHD type  - atomoxetine (STRATTERA) 60 MG capsule; Take 1 capsule (60 mg total) by mouth daily.  Dispense: 30 capsule; Refill: 1  3. Generalized anxiety disorder  - FLUoxetine (PROZAC) 20 MG capsule; Take 1 capsule (20 mg total) by mouth daily.  Dispense: 30 capsule; Refill: 1  Patient to follow-up in 6 weeks Provider spent a total of 25 minutes with the patient  Jodi Mood, PA 07/15/2020, 10:35 PM

## 2020-07-17 ENCOUNTER — Other Ambulatory Visit: Payer: Self-pay

## 2020-07-17 DIAGNOSIS — I10 Essential (primary) hypertension: Secondary | ICD-10-CM | POA: Insufficient documentation

## 2020-07-17 MED ORDER — TRAMADOL HCL 50 MG PO TABS: 50.0000 mg | ORAL_TABLET | Freq: Four times a day (QID) | ORAL | 0 refills | Status: DC | PRN

## 2020-07-25 ENCOUNTER — Ambulatory Visit: Payer: 59 | Admitting: Rehabilitative and Restorative Service Providers"

## 2020-07-26 ENCOUNTER — Other Ambulatory Visit: Payer: Self-pay | Admitting: Family Medicine

## 2020-07-28 ENCOUNTER — Ambulatory Visit: Payer: 59 | Admitting: Internal Medicine

## 2020-07-28 NOTE — Progress Notes (Deleted)
Office Visit Note  Patient: Jodi Gilmore             Date of Birth: [birth year]           MRN: [id number]             PCP: Eulis Foster, MD Referring: Martyn Malay, MD Visit Date: 07/28/2020 Occupation: @GUAROCC @  Subjective:  No chief complaint on file.   History of Present Illness: Jodi Gilmore is a 65 y.o. female here for elevated CRP with history of RA and sjogren's syndrome.***   Labs reviewed 02/2020 ANA neg Scl-70 neg CCP neg CRP 7.98 HLA B27 neg  Activities of Daily Living:  Patient reports morning stiffness for *** {minute/hour:19697}.   Patient {ACTIONS;DENIES/REPORTS:21021675::"Denies"} nocturnal pain.  Difficulty dressing/grooming: {ACTIONS;DENIES/REPORTS:21021675::"Denies"} Difficulty climbing stairs: {ACTIONS;DENIES/REPORTS:21021675::"Denies"} Difficulty getting out of chair: {ACTIONS;DENIES/REPORTS:21021675::"Denies"} Difficulty using hands for taps, buttons, cutlery, and/or writing: {ACTIONS;DENIES/REPORTS:21021675::"Denies"}  No Rheumatology ROS completed.   PMFS History:  Patient Active Problem List   Diagnosis Date Noted   HSV (herpes simplex virus) anogenital infection 06/26/2020   Generalized anxiety disorder 06/04/2020   Attention deficit hyperactivity disorder (ADHD) 06/04/2020   Migraine headache with aura 05/17/2020   Sinusitis chronic, frontal 05/17/2020   HLD (hyperlipidemia) 05/17/2020   Elevated C-reactive protein (CRP) 02/13/2020   Other specified arthritis, left knee 02/13/2020   Hip arthritis 02/13/2020   Bipolar disorder ([place]) 02/13/2020   Hypothyroidism (acquired) 01/03/2020   Diarrhea in adult patient 01/03/2020   Brain fog 01/03/2020   Healthcare maintenance 01/03/2020    Past Medical History:  Diagnosis Date   Allergy    Anxiety    situational    Asthma    yeras ago - not current    Behcet's disease ([place])    Bell's palsy    Cataract    both removed    Depression    situational -    Diplopia     Elevated blood pressure reading    no BP meds currently    GERD (gastroesophageal reflux disease)    Graves disease 1991   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    Hip pain    HLA B27 (HLA B27 positive)    Hyperlipidemia    Knee pain    Memory changes    Morphea scleroderma    Neuromuscular disorder (HCC)    Raynauds    Psoriatic arthritis (HCC)    seen in ears only   Raynaud's disease    Sjogren syndrome with dental involvement     Family History  Problem Relation Age of Onset   Colon cancer Neg Hx    Colon polyps Neg Hx    Esophageal cancer Neg Hx    Stomach cancer Neg Hx    Rectal cancer Neg Hx    Past Surgical History:  Procedure Laterality Date   laproscopy     SALPINGECTOMY  1980   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     Social History   Social History Narrative   Not on file   Immunization History  Administered Date(s) Administered   Moderna Sars-Covid-2 Vaccination 11/29/2019, 12/25/2019   PFIZER Comirnaty([place])Covid-19 Tri-Sucrose Vaccine 06/24/2020     Objective: Vital Signs: There were no vitals taken for this visit.   Physical Exam   Musculoskeletal Exam: ***  CDAI Exam: CDAI Score: -- Patient Global: --; Provider Global: -- Swollen: --; Tender: -- Joint Exam 07/28/2020   No joint exam has been documented for this  visit   There is currently no information documented on the homunculus. Go to the Rheumatology activity and complete the homunculus joint exam.  Investigation: No additional findings.  Imaging: No results found.  Recent Labs: Lab Results  Component Value Date   WBC 7.6 01/03/2020   HGB 13.9 01/03/2020   PLT 363 01/03/2020   NA 137 05/15/2020   K 4.4 05/15/2020   CL 98 05/15/2020   CO2 21 05/15/2020   GLUCOSE 84 05/15/2020   BUN 18 05/15/2020   CREATININE 0.93 05/15/2020   BILITOT 0.2 01/01/2020   ALKPHOS 128 (H) 01/01/2020   AST 33 01/01/2020   ALT 42 (H) 01/01/2020   PROT 7.5  01/01/2020   ALBUMIN 4.9 (H) 01/01/2020   CALCIUM 10.0 05/15/2020   GFRAA 60 02/12/2020    Speciality Comments: No specialty comments available.  Procedures:  No procedures performed Allergies: Contrast media [iodinated diagnostic agents], Prohance [gadoteridol], Latex, Ptu [propylthiouracil], Sulfa antibiotics, and Tapazole [methimazole]   Assessment / Plan:     Visit Diagnoses: No diagnosis found.  Orders: No orders of the defined types were placed in this encounter.  No orders of the defined types were placed in this encounter.   Face-to-face time spent with patient was *** minutes. Greater than 50% of time was spent in counseling and coordination of care.  Follow-Up Instructions: No follow-ups on file.   Collier Salina, MD  Note - This record has been created using [employer].  Chart creation errors have been sought, but may not always  have been located. Such creation errors do not reflect on  the standard of medical care.

## 2020-07-30 ENCOUNTER — Other Ambulatory Visit: Payer: Self-pay | Admitting: Orthopaedic Surgery

## 2020-07-30 ENCOUNTER — Ambulatory Visit (INDEPENDENT_AMBULATORY_CARE_PROVIDER_SITE_OTHER): Payer: 59 | Admitting: Orthopaedic Surgery

## 2020-07-30 ENCOUNTER — Other Ambulatory Visit: Payer: Self-pay

## 2020-07-30 ENCOUNTER — Telehealth: Payer: Self-pay | Admitting: Orthopaedic Surgery

## 2020-07-30 ENCOUNTER — Ambulatory Visit: Payer: 59 | Admitting: Orthopaedic Surgery

## 2020-07-30 ENCOUNTER — Encounter: Payer: Self-pay | Admitting: Orthopaedic Surgery

## 2020-07-30 VITALS — Ht 64.75 in | Wt 168.0 lb

## 2020-07-30 DIAGNOSIS — M1612 Unilateral primary osteoarthritis, left hip: Secondary | ICD-10-CM | POA: Diagnosis not present

## 2020-07-30 MED ORDER — TRAMADOL HCL 50 MG PO TABS: 50.0000 mg | ORAL_TABLET | Freq: Four times a day (QID) | ORAL | 0 refills | Status: DC | PRN

## 2020-07-30 NOTE — Progress Notes (Signed)
Office Visit Note   Patient: Jodi Gilmore           Date of Birth: 1955-10-15           MRN: [id number] Visit Date: 07/30/2020              Requested by: Eulis Foster, MD [address] [place],  [place] [address] PCP: Eulis Foster, MD   Assessment & Plan: Visit Diagnoses:  1. Primary osteoarthritis of left hip     Plan: Discussed treatments to the patient these include conservative care with the offloading of the left hip using a cane in right hand and pain medication, intra-articular injection.  Patient is not interested in conservative treatment.  Therefore recommend left total hip arthroplasty.  Risk benefits of surgery discussed with patient at length by Dr. Ninfa Linden myself.  Questions were encouraged and answered by Dr. Ninfa Linden myself.  Handout on hip replacement was given.  Hip model including arthroplasty components were shown to the patient.  Risks were discussed and are not limited to but include DVT, blood loss, wound healing problems, infection leg length discrepancy and blood loss.  We will work on scheduling patient in the near future for left total hip arthroplasty.  We will see her back 2 weeks postop.  Follow-Up Instructions: Return for post op.   Orders:  No orders of the defined types were placed in this encounter.  No orders of the defined types were placed in this encounter.     Procedures: No procedures performed   Clinical Data: No additional findings.   Subjective: Chief Complaint  Patient presents with   Left Hip - Pain    HPI Patient is 65 year old female were seen for the first time for left hip pain.  States pains been ongoing for the past few years became worse over the last year and a half.  She states the hip gives way on her.  She has pain in the hip groin region with sitting for long period of time.  She also sees neurosurgeon here in town but has been told that he feels that most of the pain that she is  describing is coming from her hip and not her back.  She has difficulty walking due to the pain.  She is taking ibuprofen and Tylenol for the pain she also has been taking occasional tramadol.  She states that the pain in the hip is causing her to stay in bed most of the time.  She has had no injections for the hip. Radiographs left hip AP and lateral views: No acute fracture.  Bone-on-bone arthritis.  Hips well located.  Review of Systems  Constitutional:  Positive for activity change. Negative for chills and fever.  Respiratory:  Negative for shortness of breath.   Cardiovascular:  Negative for chest pain.  Musculoskeletal:  Positive for arthralgias and back pain.    Objective: Vital Signs: Ht 5' 4.75" (1.645 m)   Wt 168 lb (76.2 kg)   BMI 28.17 kg/m   Physical Exam Constitutional:      Appearance: She is not ill-appearing or diaphoretic.  Pulmonary:     Effort: Pulmonary effort is normal.  Neurological:     Mental Status: She is alert and oriented to person, place, and time.  Psychiatric:        Mood and Affect: Mood normal.    Ortho Exam  Specialty Comments:  No specialty comments available.  Imaging: No results found.   PMFS History: Patient Active  Problem List   Diagnosis Date Noted   Primary osteoarthritis of left hip 07/30/2020   HSV (herpes simplex virus) anogenital infection 06/26/2020   Generalized anxiety disorder 06/04/2020   Attention deficit hyperactivity disorder (ADHD) 06/04/2020   Migraine headache with aura 05/17/2020   Sinusitis chronic, frontal 05/17/2020   HLD (hyperlipidemia) 05/17/2020   Elevated C-reactive protein (CRP) 02/13/2020   Other specified arthritis, left knee 02/13/2020   Hip arthritis 02/13/2020   Bipolar disorder ([place]) 02/13/2020   Hypothyroidism (acquired) 01/03/2020   Diarrhea in adult patient 01/03/2020   Brain fog 01/03/2020   Healthcare maintenance 01/03/2020   Past Medical History:  Diagnosis Date   Allergy     Anxiety    situational    Asthma    yeras ago - not current    Behcet's disease ([place])    Bell's palsy    Cataract    both removed    Depression    situational -    Diplopia    Elevated blood pressure reading    no BP meds currently    GERD (gastroesophageal reflux disease)    Graves disease 1991   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    Hip pain    HLA B27 (HLA B27 positive)    Hyperlipidemia    Knee pain    Memory changes    Morphea scleroderma    Neuromuscular disorder (HCC)    Raynauds    Psoriatic arthritis (HCC)    seen in ears only   Raynaud's disease    Sjogren syndrome with dental involvement     Family History  Problem Relation Age of Onset   Colon cancer Neg Hx    Colon polyps Neg Hx    Esophageal cancer Neg Hx    Stomach cancer Neg Hx    Rectal cancer Neg Hx     Past Surgical History:  Procedure Laterality Date   laproscopy     SALPINGECTOMY  1980   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     Social History   Occupational History   Not on file  Tobacco Use   Smoking status: Former   Smokeless tobacco: Never  Substance and Sexual Activity   Alcohol use: Not Currently   Drug use: Never   Sexual activity: Not on file

## 2020-07-30 NOTE — Telephone Encounter (Signed)
Patient called stating the Rx for Tramadol is not showing received at the pharmacy yet. The number to contact patient is [phone]

## 2020-07-30 NOTE — Telephone Encounter (Signed)
Please advise 

## 2020-07-31 ENCOUNTER — Ambulatory Visit: Payer: 59 | Admitting: Family Medicine

## 2020-08-11 ENCOUNTER — Other Ambulatory Visit: Payer: Self-pay | Admitting: Orthopaedic Surgery

## 2020-08-19 ENCOUNTER — Other Ambulatory Visit: Payer: Self-pay | Admitting: Family Medicine

## 2020-08-22 ENCOUNTER — Telehealth: Payer: Self-pay

## 2020-08-22 ENCOUNTER — Other Ambulatory Visit: Payer: Self-pay | Admitting: Family Medicine

## 2020-08-22 ENCOUNTER — Other Ambulatory Visit: Payer: Self-pay | Admitting: Orthopaedic Surgery

## 2020-08-22 MED ORDER — TRAMADOL HCL 50 MG PO TABS: 50.0000 mg | ORAL_TABLET | Freq: Four times a day (QID) | ORAL | 0 refills | Status: DC | PRN

## 2020-08-22 NOTE — Telephone Encounter (Signed)
Request refill of Tramadol [facility] [employer] [facility]

## 2020-08-26 ENCOUNTER — Ambulatory Visit: Payer: 59

## 2020-08-28 ENCOUNTER — Other Ambulatory Visit: Payer: Self-pay

## 2020-08-28 ENCOUNTER — Encounter: Payer: Self-pay | Admitting: Orthopaedic Surgery

## 2020-08-28 ENCOUNTER — Encounter: Payer: Self-pay | Admitting: Family Medicine

## 2020-08-28 ENCOUNTER — Telehealth (INDEPENDENT_AMBULATORY_CARE_PROVIDER_SITE_OTHER): Payer: No Payment, Other | Admitting: Physician Assistant

## 2020-08-28 DIAGNOSIS — F3160 Bipolar disorder, current episode mixed, unspecified: Secondary | ICD-10-CM

## 2020-08-28 DIAGNOSIS — F909 Attention-deficit hyperactivity disorder, unspecified type: Secondary | ICD-10-CM

## 2020-08-28 DIAGNOSIS — F411 Generalized anxiety disorder: Secondary | ICD-10-CM | POA: Diagnosis not present

## 2020-08-29 DIAGNOSIS — F3160 Bipolar disorder, current episode mixed, unspecified: Secondary | ICD-10-CM | POA: Insufficient documentation

## 2020-08-29 MED ORDER — AMPHETAMINE-DEXTROAMPHETAMINE 5 MG PO TABS: 5.0000 mg | ORAL_TABLET | Freq: Every day | ORAL | 0 refills | Status: DC

## 2020-08-29 MED ORDER — FLUOXETINE HCL 40 MG PO CAPS: 40.0000 mg | ORAL_CAPSULE | Freq: Every day | ORAL | 1 refills | Status: DC

## 2020-08-29 MED ORDER — ZIPRASIDONE HCL 40 MG PO CAPS: 40.0000 mg | ORAL_CAPSULE | Freq: Two times a day (BID) | ORAL | 1 refills | Status: DC

## 2020-08-29 NOTE — Progress Notes (Signed)
[place] MD/PA/NP OP Progress Note  Virtual Visit via Telephone Note  I connected with Jodi Gilmore on 10/10/20 at  5:00 PM EDT by telephone and verified that I am speaking with the correct person using two identifiers.  Location: Patient: Home Provider: Clinic   I discussed the limitations, risks, security and privacy concerns of performing an evaluation and management service by telephone and the availability of in person appointments. I also discussed with the patient that there may be a patient responsible charge related to this service. The patient expressed understanding and agreed to proceed.  Follow Up Instructions:   I discussed the assessment and treatment plan with the patient. The patient was provided an opportunity to ask questions and all were answered. The patient agreed with the plan and demonstrated an understanding of the instructions.   The patient was advised to call back or seek an in-person evaluation if the symptoms worsen or if the condition fails to improve as anticipated.  I provided 22 minutes of non-face-to-face time during this encounter.  Malachy Mood, PA    08/29/2020 1:36 AM Jodi Gilmore  MRN:  [id number]  Chief Complaint: Follow up and medication management  HPI:     Visit Diagnosis:    ICD-10-CM   1. Attention deficit hyperactivity disorder (ADHD), unspecified ADHD type  F90.9 amphetamine-dextroamphetamine (ADDERALL) 5 MG tablet    2. Bipolar affective disorder, current episode mixed, current episode severity unspecified ([place])  F31.60 FLUoxetine (PROZAC) 40 MG capsule    ziprasidone (GEODON) 40 MG capsule    3. 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    Hip pain    HLA B27 (HLA B27 positive)    Hyperlipidemia    Knee pain    Memory changes    Morphea scleroderma    Neuromuscular disorder (HCC)    Raynauds    Psoriatic arthritis (HCC)    seen in ears only   Raynaud's disease    Sjogren syndrome with dental involvement     Past Surgical History:  Procedure Laterality Date   laproscopy     SALPINGECTOMY  1980   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      Family Psychiatric History:  Nephew - Schizophrenia Father - patient suspects that her father may be bipolar  Family History:  Family History  Problem Relation Age of Onset   Colon cancer Neg Hx    Colon polyps Neg Hx    Esophageal cancer Neg Hx    Stomach cancer Neg Hx    Rectal cancer Neg Hx     Social History:  Social History   Socioeconomic History   Marital status: Single    Spouse name: Not on file   Number of children: Not on file  Years of education: Not on file   Highest education level: Not on file  Occupational History   Not on file  Tobacco Use   Smoking status: Former   Smokeless tobacco: Never  Substance and Sexual Activity   Alcohol use: Not Currently   Drug use: Never   Sexual activity: Not on file  Other Topics Concern   Not on file  Social History Narrative   Not on file   Social Determinants of Health   Financial Resource Strain: Not on file  Food Insecurity: Not on file  Transportation Needs: Not on file  Physical Activity: Not on file  Stress: Not on file  Social Connections: Not on file    Allergies:  Allergies  Allergen Reactions   Contrast Media [Iodinated Diagnostic Agents] Anaphylaxis   Prohance [Gadoteridol] Anaphylaxis   Latex     Ptu [Propylthiouracil]    Sulfa Antibiotics    Tapazole [Methimazole]     Metabolic Disorder Labs: Lab Results  Component Value Date   HGBA1C 5.2 01/01/2020   No results found for: PROLACTIN Lab Results  Component Value Date   CHOL 272 (H) 02/12/2020   TRIG 145 02/12/2020   HDL 71 02/12/2020   CHOLHDL 3.8 02/12/2020   LDLCALC 175 (H) 02/12/2020   Lab Results  Component Value Date   TSH 0.035 (L) 06/24/2020   TSH 13.400 (H) 05/15/2020    Therapeutic Level Labs: No results found for: LITHIUM No results found for: VALPROATE No components found for:  CBMZ  Current Medications: Current Outpatient Medications  Medication Sig Dispense Refill   amphetamine-dextroamphetamine (ADDERALL) 5 MG tablet Take 1 tablet (5 mg total) by mouth daily. 30 tablet 0   Acetaminophen (TYLENOL EXTRA STRENGTH PO) Take by mouth. 650 mg tabs up to 6 a day     atomoxetine (STRATTERA) 60 MG capsule Take 1 capsule (60 mg total) by mouth daily. 30 capsule 1   atorvastatin (LIPITOR) 40 MG tablet Take 1 tablet (40 mg total) by mouth daily. 90 tablet 3   B Complex Vitamins (VITAMIN B COMPLEX PO) Take 1 tablet by mouth daily.     Bacillus Coagulans-Inulin (PROBIOTIC-PREBIOTIC PO) Take 1 capsule by mouth daily.     cetirizine (ZYRTEC ALLERGY) 10 MG tablet Take 1 tablet (10 mg total) by mouth daily. 90 tablet 1   Cholecalciferol (VITAMIN D) 125 MCG (5000 UT) CAPS Take 10,000 Units by mouth daily.     diphenhydrAMINE (BENADRYL) 25 MG tablet Take 50 mg by mouth every 6 (six) hours as needed for allergies.     FEVERFEW PO Take 1 tablet by mouth 3 (three) times daily.     FLUoxetine (PROZAC) 40 MG capsule Take 1 capsule (40 mg total) by mouth daily. 30 capsule 1   fluticasone (FLONASE) 50 MCG/ACT nasal spray Place 2 sprays into both nostrils daily. 16 g 6   glucosamine-chondroitin 500-400 MG tablet Take 1 tablet by mouth 3 (three) times daily.     ibuprofen (ADVIL) 600 MG tablet Take 1,200 mg by mouth 3  (three) times daily. 1200 mg TID  she takes 6-200 mg tabs  tid     levothyroxine (SYNTHROID) 137 MCG tablet TAKE ONE TABLET BY MOUTH EVERY MORNING 30 MINUTES BEFORE FOOD 90 tablet 1   lisinopril (ZESTRIL) 2.5 MG tablet Take 1 tablet (2.5 mg total) by mouth at bedtime. 90 tablet 3   Magnesium Oxide (MAG-OX 400 PO) Take 1 tablet by mouth 2 times daily at 12 noon and 4 pm.  Multiple Vitamin (MULTIVITAMIN) tablet Take 1 tablet by mouth daily.     Omega-3 Fatty Acids (OMEGA-3 EPA FISH OIL PO) Take 3 capsules by mouth daily.     omeprazole (PRILOSEC) 20 MG capsule Take 1 capsule (20 mg total) by mouth in the morning and at bedtime. 90 capsule 0   OVER THE COUNTER MEDICATION Homeopathic meds  daily     Potassium Bicarbonate 99 MG CAPS Take 1 capsule by mouth 3 (three) times daily.     pseudoephedrine (SUDAFED) 30 MG tablet Take 30 mg by mouth every 4 (four) hours as needed for congestion.     RESTASIS 0.05 % ophthalmic emulsion INSTILL ONE DROP INTO BOTH EYES TWICE A DAY 60 each 3   rizatriptan (MAXALT) 10 MG tablet TAKE 1 TABLET BY MOUTH AS NEEDED FOR MIGRAINE, MAY REPEAT IN TWO HOURS IF NEEDED 12 tablet 1   traMADol (ULTRAM) 50 MG tablet Take 1 tablet (50 mg total) by mouth every 6 (six) hours as needed. 30 tablet 0   TURMERIC PO Take by mouth.     ziprasidone (GEODON) 40 MG capsule Take 1 capsule (40 mg total) by mouth 2 (two) times daily with a meal. 60 capsule 1   No current facility-administered medications for this visit.     Musculoskeletal: Strength & Muscle Tone: Unable to assess due to telemedicine visit [place]: Unable to assess due to telemedicine visit Patient leans: Unable to assess due to telemedicine visit  Psychiatric Specialty Exam: Review of Systems  Psychiatric/Behavioral:  Positive for decreased concentration and sleep disturbance. Negative for dysphoric mood, hallucinations, self-injury and suicidal ideas. The patient is nervous/anxious. The patient is not  hyperactive.    There were no vitals taken for this visit.There is no height or weight on file to calculate BMI.  General Appearance: Unable to assess due to telemedicine visit  Eye Contact:  Unable to assess due to telemedicine visit  Speech:  Clear and Coherent and Normal Rate  Volume:  Normal  Mood:  Anxious and Depressed  Affect:  Congruent and Depressed  Thought Process:  Coherent, Goal Directed, and Descriptions of Associations: Intact  Orientation:  Full (Time, Place, and Person)  Thought Content: WDL   Suicidal Thoughts:  No  Homicidal Thoughts:  No  Memory:  Immediate;   Fair Recent;   Fair Remote;   Fair  Judgement:  Good  Insight:  Good  Psychomotor Activity:  Normal  Concentration:  Concentration: Good and Attention Span: Good  Recall:  Good  Fund of Knowledge: Good  Language: Good  Akathisia:  NA  Handed:  Right  AIMS (if indicated): not done  Assets:  Communication Skills Desire for Improvement Housing  ADL's:  Intact  Cognition: WNL  Sleep:  Poor   Screenings: GAD-7    Flowsheet Row Video Visit from 08/28/2020 in [facility] Video Visit from 07/15/2020 in [facility] Video Visit from 06/04/2020 in [facility]  Total GAD-7 Score _0 PHQ2-9    Flowsheet Row [facility] Visit from 08/28/2020 in [facility] Video Visit from 07/15/2020 in [facility] Office Visit from 06/24/2020 in [place] Video Visit from 06/04/2020 in [facility] Office Visit from 05/15/2020 in [place]  PHQ-2 Total Score _1 PHQ-9 Total Score _2 13  Flowsheet Row Video Visit from 08/28/2020 in [facility] Video Visit from 07/15/2020 in [facility] Video Visit from 06/04/2020 in [facility] CATEGORY Low Risk Low Risk Low Risk        Assessment and Plan:     1. Bipolar affective disorder, current episode mixed, current episode severity unspecified (HCC)  - FLUoxetine (PROZAC) 40 MG capsule; Take 1 capsule (40 mg total) by mouth daily.  Dispense: 30 capsule; Refill: 1 - ziprasidone (GEODON) 40 MG capsule; Take 1 capsule (40 mg total) by mouth 2 (two) times daily with a meal.  Dispense: 60 capsule; Refill: 1  2. Generalized anxiety disorder  - FLUoxetine (PROZAC) 40 MG capsule; Take 1 capsule (40 mg total) by mouth daily.  Dispense: 30 capsule; Refill: 1  3. Attention deficit hyperactivity disorder (ADHD), unspecified ADHD type Patient to start taking adderall 5 mg daily for the management of her ADHD  Patient to follow up in 6 weeks Provider spent a total of 22 minutes with the patient/reviewing patient's chart  Malachy Mood, PA 08/29/2020, 1:36 AM

## 2020-08-31 ENCOUNTER — Other Ambulatory Visit: Payer: Self-pay | Admitting: Orthopaedic Surgery

## 2020-08-31 NOTE — Progress Notes (Signed)
Office Visit Note  Patient: Jodi Gilmore             Date of Birth: [birth year]           MRN: [id number]             PCP: Eulis Foster, MD Referring: Martyn Malay, MD Visit Date: 09/01/2020  Subjective:   History of Present Illness: Jodi Gilmore is a 65 y.o. female here for evaluation with history of multiple rheumatic conditions apparently RA, sjogren's syndrome, scleroderma, and raynaud's and with history of hypothyroidism. She has a somewhat complicated history with numerous symptoms both focal symptoms including joint pain and swelling, eye and mouth dryness, skin lesions reportedly both psoriasis and behcets pathology previously seen. At the moment skin disease is not very active. Joint pain is worst in the left hip with severe degenerative arthritis already seen at orthopedic surgery clinic. She also has osteoarthritis of other sites with worsening changes of fingers on bilateral hands. However she does have multiple generalized symptoms and has had labile thyroid disease with difficult to control  levothyroxine dose changes. Chronic dry eyes and mouth with what sounds like possible abrasion but no past positive biopsy specific for sjogren syndrome. Previous autoimmune serology has been somewhat variable, with mixed positive markers and sometimes negative ANA. CRP has been persistently elevated. She reports occasional oral ulcers usually on sides. She denies lymphadenopathy, focal alopecia, photosensitive skin rashes or residual hyperpigmentation, or history of blood clots.  Previous patient of Dr. Annamaria Boots with [facility] problems treated reviewed including ADD, hypothyroidism, chronic back pain, fatigue, depression, and vitamin D deficiency.  Labs reviewed 05/2020 TSH 13.4 fT4 1.07  02/2020 ANA neg RNA Poly III neg Scl-70 neg CCP 84 hsCRP 7.98 HLA-B27 neg BMP eGFR 52  08/2018 hsCRP >10.0 TSH 20.28   Imaging reviewed 06/19/20 MRI Lumbar  spine IMPRESSION: Convex right scoliosis. Right greater than left subarticular recess narrowing at L1-2 due to bilateral disc protrusions. Mild left subarticular recess and moderate left foraminal narrowing at L2-3. Left subarticular recess and foraminal protrusion at L3-4 could impact the descending left L4 root and causes mild to moderate left foraminal narrowing. Moderately severe left and mild-to-moderate right foraminal narrowing at L4-5 where there is moderate central canal stenosis. Mild to moderate right foraminal narrowing L5-S1. The central canal and left foramen are open.  05/08/20 Xray left hip Severe arthritic changes of the left hip.  05/08/20 Xray left knee Minimal arthritic changes of the left knee. No joint effusion. The soft tissues are unremarkable.   Activities of Daily Living:  Patient reports morning stiffness for all day. Patient Reports nocturnal pain.  Difficulty dressing/grooming: Reports Difficulty climbing stairs: Reports Difficulty getting out of chair: Reports Difficulty using hands for taps, buttons, cutlery, and/or writing: Denies  Review of Systems  Constitutional:  Positive for fatigue.  HENT:  Positive for mouth sores, mouth dryness and nose dryness.   Eyes:  Positive for photophobia, pain and dryness. Negative for itching.  Respiratory:  Positive for shortness of breath. Negative for difficulty breathing.   Cardiovascular:  Negative for chest pain and palpitations.  Gastrointestinal:  Positive for constipation and diarrhea. Negative for blood in stool.  Endocrine: Negative for increased urination.  Genitourinary:  Negative for difficulty urinating.  Musculoskeletal:  Positive for joint pain, joint pain, myalgias, morning stiffness, muscle tenderness and myalgias. Negative for joint swelling.  Skin:  Positive for color change. Negative for rash and redness.  Allergic/Immunologic: Negative for susceptible to  infections.  Neurological:  Positive for  headaches. Negative for dizziness, numbness and memory loss.  Hematological:  Positive for bruising/bleeding tendency.  Psychiatric/Behavioral:  Negative for confusion.    PMFS History:  Patient Active Problem List   Diagnosis Date Noted   Bilateral hand pain 09/01/2020   Lower back injury 09/01/2020   Idiopathic scoliosis of lumbar spine 09/01/2020   Bipolar affective disorder, current episode mixed ([place]) 08/29/2020   Primary osteoarthritis of left hip 07/30/2020   Essential (primary) hypertension 07/17/2020   HSV (herpes simplex virus) anogenital infection 06/26/2020   Generalized anxiety disorder 06/04/2020   Attention deficit hyperactivity disorder (ADHD) 06/04/2020   Migraine headache with aura 05/17/2020   Sinusitis chronic, frontal 05/17/2020   HLD (hyperlipidemia) 05/17/2020   Elevated C-reactive protein (CRP) 02/13/2020   Other specified arthritis, left knee 02/13/2020   Hip arthritis 02/13/2020   Bipolar disorder ([place]) 02/13/2020   Hypothyroidism (acquired) 01/03/2020   Diarrhea in adult patient 01/03/2020   Brain fog 01/03/2020   Healthcare maintenance 01/03/2020    Past Medical History:  Diagnosis Date   Allergy    Anxiety    situational    Asthma    yeras ago - not current    Behcet's disease ([place])    Bell's palsy    Cataract    both removed    Depression    situational -    Diplopia    Elevated blood pressure reading    no BP meds currently    GERD (gastroesophageal reflux disease)    Graves disease 1991   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    Hip pain    HLA B27 (HLA B27 positive)    Hyperlipidemia    Knee pain    Memory changes    Morphea scleroderma    Neuromuscular disorder (HCC)    Raynauds    Psoriatic arthritis (HCC)    seen in ears only   Raynaud's disease    Sjogren syndrome with dental involvement     Family History  Problem Relation Age of Onset   Heart disease Mother    Congestive  Heart Failure Mother    Dementia Mother    Heart disease Father    Heart attack Father    Dementia Sister    Hemachromatosis Sister    Parkinson's disease Sister    Allergies Son    Healthy Son    Colon cancer Neg Hx    Colon polyps Neg Hx    Esophageal cancer Neg Hx    Stomach cancer Neg Hx    Rectal cancer Neg Hx    Past Surgical History:  Procedure Laterality Date   laproscopy     SALPINGECTOMY  1980   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     Social History   Social History Narrative   Not on file   Immunization History  Administered Date(s) Administered   Moderna Sars-Covid-2 Vaccination 11/29/2019, 12/25/2019   PFIZER Comirnaty(Gray Top)Covid-19 Tri-Sucrose Vaccine 06/24/2020     Objective: Vital Signs: BP 124/71 (BP Location: Right Arm, Patient Position: Sitting, Cuff Size: Normal)   Pulse (!) 105   Ht 5' 5.75" (1.67 m)   Wt 165 lb (74.8 kg)   BMI 26.83 kg/m    Physical Exam HENT:     Mouth/Throat:     Mouth: Mucous membranes are moist.     Pharynx: Oropharynx is clear.  Eyes:     Conjunctiva/sclera: Conjunctivae normal.  Cardiovascular:  Rate and Rhythm: Regular rhythm. Tachycardia present.  Pulmonary:     Effort: Pulmonary effort is normal.     Breath sounds: Normal breath sounds.  Skin:    General: Skin is warm and dry.  Neurological:     Mental Status: She is alert.  Psychiatric:        Mood and Affect: Mood normal.     Musculoskeletal Exam:  Shoulders full ROM no tenderness or swelling Elbows full ROM no tenderness or swelling Fingers ROM is intact, heberdon's nodes of multiple digits, 2nd DIP lateral deviation bilaterally and shortening of the distal phalanx on right, squaring of 1st CMC joints bilaterally, no palpable synovitis Left hip internal ROM decreased, no lateral tenderness, right hip normal ROM Knees full ROM no tenderness or swelling with patellofemoral crepitus present   Investigation: No additional findings.  Imaging: XR Hand 2  View Left  Result Date: 09/02/2020 Xray left hand 2 views Radiocarpal joint space appears normal. Few cystic changes in carpal bones. 1st CMC degenerative change less advanced compared to right hand. MCP and PIP joint spaces appear normal. DIP degenerative arthritis in 3rd DIP. 2nd DIP more severe disease some erosion especially of base of distal phalanx. No juxta articular demineralization is visible. 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 Multiple cystic changes present in carpal bones. Moderately advancde 1st CMC joint degenerative arthritis. MCP and PIP joint spaces appear preserved/ DIP narrowing with 2nd digit probable erosive changes as well. No juxtaarticular demineralization. Impression Osteoarthritis changes of first CMC joint and DIP joints, suspicious for erosive OA changes at the 2nd DIP.   Recent Labs: Lab Results  Component Value Date   WBC 7.6 01/03/2020   HGB 13.9 01/03/2020   PLT 363 01/03/2020   NA 137 05/15/2020   K 4.4 05/15/2020   CL 98 05/15/2020   CO2 21 05/15/2020   GLUCOSE 84 05/15/2020   BUN 18 05/15/2020   CREATININE 0.93 05/15/2020   BILITOT 0.2 01/01/2020   ALKPHOS 128 (H) 01/01/2020   AST 33 01/01/2020   ALT 42 (H) 01/01/2020   PROT 7.5 01/01/2020   ALBUMIN 4.9 (H) 01/01/2020   CALCIUM 10.0 05/15/2020   GFRAA 60 02/12/2020    Speciality Comments: No specialty comments available.  Procedures:  No procedures performed Allergies: Contrast media [iodinated diagnostic agents], Prohance [gadoteridol], Latex, Ptu [propylthiouracil], Sulfa antibiotics, and Tapazole [methimazole]   Assessment / Plan:     Visit Diagnoses: Elevated C-reactive protein (CRP) - Plan: XR Hand 2 View Right, XR Hand 2 View Left, Sjogrens syndrome-A extractable nuclear antibody, C3 and C4, Sjogrens  syndrome-B extractable nuclear antibody, TSH, Rheumatoid factor, C-reactive protein  Chronic elevated inflammatory markers with multiple symptoms. History is unclear there is definitely extensive osteoarthritis change. Primary sjogrens syndrome possibly checking SSA Abs today. Checking complements, RF as well for associated changes. Repeat CRP level today as well. May be candidate for DMARD such as hydroxychloroquine or maybe antiinflammatory such as colchicine if there is ongoing inflammation  Primary osteoarthritis of left hip  Seeing orthopedic surgery and would benefit with arthroplasty her ROM is very reduced likely worsening symptoms at level of back and knee.  Hypothyroidism (acquired)  TSH level for thyroid disease most recent few months ago before last dose adjustment. Very labile over reviewed values.  Bilateral hand pain - Plan:  XR Hand 2 View Right, XR Hand 2 View Left, Sjogrens syndrome-A extractable nuclear antibody, C3 and C4, Sjogrens syndrome-B extractable nuclear antibody, TSH, Rheumatoid factor, C-reactive protein  Bilateral hand pain and extensive osteoarthritis changes. Checking bilateral hand xrays for evidence of erosive disease or secondary causes. Also working up for inflammatory arthritis markers as above.  Orders: Orders Placed This Encounter  Procedures   XR Hand 2 View Right   XR Hand 2 View Left   Sjogrens syndrome-A extractable nuclear antibody   C3 and C4   Sjogrens syndrome-B extractable nuclear antibody   TSH   Rheumatoid factor   C-reactive protein    No orders of the defined types were placed in this encounter.    Follow-Up Instructions: Return in about 2 weeks (around 09/15/2020) for New pt ?pSS/behcet/OA f/u 2wks.   Collier Salina, MD  Note - This record has been created using [employer].  Chart creation errors have been sought, but may not always  have been located. Such creation errors do not reflect on  the standard of medical  care.

## 2020-09-01 ENCOUNTER — Ambulatory Visit: Payer: 59 | Admitting: Internal Medicine

## 2020-09-01 ENCOUNTER — Encounter: Payer: Self-pay | Admitting: Internal Medicine

## 2020-09-01 ENCOUNTER — Ambulatory Visit: Payer: Self-pay

## 2020-09-01 ENCOUNTER — Other Ambulatory Visit: Payer: Self-pay

## 2020-09-01 VITALS — BP 124/71 | HR 105 | Ht 65.75 in | Wt 165.0 lb

## 2020-09-01 DIAGNOSIS — R7982 Elevated C-reactive protein (CRP): Secondary | ICD-10-CM

## 2020-09-01 DIAGNOSIS — M79642 Pain in left hand: Secondary | ICD-10-CM | POA: Insufficient documentation

## 2020-09-01 DIAGNOSIS — E039 Hypothyroidism, unspecified: Secondary | ICD-10-CM

## 2020-09-01 DIAGNOSIS — M4126 Other idiopathic scoliosis, lumbar region: Secondary | ICD-10-CM | POA: Insufficient documentation

## 2020-09-01 DIAGNOSIS — M1612 Unilateral primary osteoarthritis, left hip: Secondary | ICD-10-CM | POA: Diagnosis not present

## 2020-09-01 DIAGNOSIS — M79641 Pain in right hand: Secondary | ICD-10-CM

## 2020-09-01 DIAGNOSIS — S3992XA Unspecified injury of lower back, initial encounter: Secondary | ICD-10-CM | POA: Insufficient documentation

## 2020-09-01 NOTE — Patient Instructions (Addendum)
C-Reactive Protein Test Why am I having this test? The C-reactive protein (CRP) is a substance that the liver releases in response to inflammation within the body. You may have a CRP test to help diagnose: Serious bacterial or fungal infections. Inflammatory diseases, such as inflammatory bowel disease. Lupus, rheumatoid arthritis, or other autoimmune diseases. What is being tested? This test checks for the level of CRP in your blood. The level of CRP in yourbody increases greatly after a heart attack, infection, or injury. What kind of sample is taken?  A blood sample is required for this test. It is usually collected by insertinga needle into a blood vessel. Tell a health care provider about: All medicines you are taking, including vitamins, herbs, eye drops, creams, and over-the-counter medicines. Any medical conditions you have. The use of birth control pills or hormone replacement therapy such as estrogen. Any blood disorders you have. Whether you are pregnant or may be pregnant. How are the results reported? Your test results will be reported as a value that indicates how much CRP is in your blood. This will be reported as milligrams of CRP per liter (mg/L) ofblood. Your health care provider will compare your results to normal ranges that were established after testing a large group of people (reference ranges). Reference ranges may vary among labs and hospitals. For this test, thestandard CRP reference value is less than 10 mg/L. What do the results mean? A standard CRP test result that is less than 10 mg/L is considered normal, meaning that you do not have an abnormally high level of inflammation in yourbody. A standard CRP test result that is greater than 10 mg/L means that there is an abnormally high level of inflammation in your body that is causing CRP to be released. Inflammation may result from many different conditions or injuries.You will have more tests to help make a  diagnosis. Talk with your health care provider about what your results mean. Questions to ask your health care provider Ask your health care provider, or the department that is doing the test: When will my results be ready? How will I get my results? What are my treatment options? What other tests do I need? What are my next steps? Summary C-reactive protein (CPR) is a substance released by the liver in response to inflammation within the body. The CRP test may be performed to help diagnose infection and inflammatory conditions. Talk with your health care provider about what your results mean.  Complement Assay Test Why am I having this test? Complement refers to a group of proteins that are part of the body's disease-fighting system (immune system). A complement assay test provides information about some or all of these proteins. You may have this test: To diagnose a lack, or deficiency, of certain complement proteins. Deficiencies can be passed from parent to child (inherited). To monitor an infection or autoimmune disease. If you have unexplained inflammation or swelling (edema). If you have bacterial infections again and again. What is being tested? This test can be used to measure: Total complement. This is the total number of protein complements in your blood. The number of each kind of complement in your blood. The nine main kinds of complement are labeled C1 through C9. Some of these complements, such as C3 and C4, are especially important and have many functions in the body. Depending on why you are having the test, your health care provider may test your total complement or only some individual complements, such as C3  and C4. The total complement assay test may be done before individual complements aretested. What kind of sample is taken?  A blood sample is required for this test. It is usually collected by insertinga needle into a blood vessel. Tell a health care provider  about: Any allergies you have. All medicines you are taking, including vitamins, herbs, eye drops, creams, and over-the-counter medicines. Any blood disorders you have. Any surgeries you have had. Any medical conditions you have. Whether you are pregnant or may be pregnant. How are the results reported? Your results will be reported as a value that tells you how much complement is in your blood. This will be given as units per milliliter of blood (units/mL) or as milligrams per deciliter of blood (mg/dL). Your results may be reportedas total complement, or as individual complements, or both. Your health care provider will compare your results to normal ranges that were established after testing a large group of people (reference ranges). Reference ranges may vary among labs and hospitals. For this test, reference ranges for some of the most commonly measured complement assays may be: Total complement: 30-75 units/mL. C2: 1-4 mg/dL. C3: 75-175 mg/dL. C4: 22-45 units/mL. What do the results mean? Results within reference ranges are considered normal, which means you have anormal amount of complement in your blood. Results that are higher than the reference ranges may be caused by: Inflammatory disease. Heart attack. Cancer. Complement deficiencies, or results lower than the reference ranges, may be caused by: Certain inherited conditions. Autoimmune disease. Certain liver diseases. Malnutrition. Certain types of anemia that result in breakdown of red blood cells (hemolytic anemia). Talk with your health care provider about what your results mean. Questions to ask your health care provider Ask your health care provider, or the department that is doing the test: When will my results be ready? How will I get my results? What are my treatment options? What other tests do I need? What are my next steps? Summary Complement refers to a group of proteins that are part of the body's  disease-fighting system (immune system). A complement assay test can provide information about some or all of these proteins. You may have a complement assay test to help diagnose a complement deficiency, and to monitor some infections or autoimmune disease. Talk with your health care provider about what your results mean.  Antinuclear Antibody Test Why am I having this test? This is a test that is used to help diagnose systemic lupus erythematosus (SLE) and other autoimmune diseases. An autoimmune disease is a disease in which the body's own defense (immune)system attacks its organs. What is being tested? This test checks for antinuclear antibodies (ANA) in the blood. The presence of ANA is associated with several autoimmune diseases. It is seen in almost allpatients with lupus. What kind of sample is taken?  A blood sample is required for this test. It is usually collected by insertinga needle into a blood vessel. How are the results reported? Your test results will be reported as either positive or negative. A false-positive result can occur. A false positive is incorrect because itmeans that a condition is present when it is not. What do the results mean? A positive test result may mean that you have: Lupus. Other autoimmune diseases, such as rheumatoid arthritis, scleroderma, or Sjgren syndrome. Conditions that may cause a false-positive result include: Liver dysfunction. Myasthenia gravis. Infectious mononucleosis. Talk with your health care provider about what your results mean. Questions to ask your health care provider  Ask your health care provider, or the department that is doing the test: When will my results be ready? How will I get my results? What are my treatment options? What other tests do I need? What are my next steps? Summary This is a test that is used to help diagnose systemic lupus erythematosus (SLE) and other autoimmune diseases. An autoimmune disease is a  disease in which the body's own defense (immune)system attacks the body. This test checks for antinuclear antibodies (ANA) in the blood. The presence of ANA is associated with several autoimmune diseases. It is seen in almost all patients with lupus. Your test results will be reported as either positive or negative. Talk with your health care provider about what your results mean. This information is not intended to replace advice given to you by your health care provider. Make sure you discuss any questions you have with your healthcare provider. Document Revised: 08/31/2019 Document Reviewed: 08/31/2019 Elsevier Patient Education  2022 [employer].

## 2020-09-01 NOTE — Telephone Encounter (Signed)
ok 

## 2020-09-02 ENCOUNTER — Encounter: Payer: Self-pay | Admitting: Family Medicine

## 2020-09-02 LAB — C3 AND C4
C3 Complement: 177 mg/dL (ref 83–193)
C4 Complement: 45 mg/dL (ref 15–57)

## 2020-09-02 LAB — TSH: TSH: 0.01 mIU/L — ABNORMAL LOW (ref 0.40–4.50)

## 2020-09-02 LAB — C-REACTIVE PROTEIN: CRP: 16.3 mg/L — ABNORMAL HIGH (ref <8.0)

## 2020-09-02 LAB — RHEUMATOID FACTOR: Rheumatoid fact SerPl-aCnc: <14 IU/mL (ref <14)

## 2020-09-02 LAB — SJOGRENS SYNDROME-B EXTRACTABLE NUCLEAR ANTIBODY: SSB (La) (ENA) Antibody, IgG: <1 AI

## 2020-09-02 LAB — SJOGRENS SYNDROME-A EXTRACTABLE NUCLEAR ANTIBODY: SSA (Ro) (ENA) Antibody, IgG: <1 AI

## 2020-09-03 ENCOUNTER — Other Ambulatory Visit: Payer: Self-pay | Admitting: Family Medicine

## 2020-09-03 MED ORDER — LEVOTHYROXINE SODIUM 112 MCG PO TABS: 112.0000 ug | ORAL_TABLET | Freq: Every day | ORAL | 0 refills | Status: DC

## 2020-09-08 ENCOUNTER — Other Ambulatory Visit: Payer: Self-pay | Admitting: Orthopaedic Surgery

## 2020-09-08 NOTE — Telephone Encounter (Signed)
ok 

## 2020-09-10 ENCOUNTER — Telehealth: Payer: Self-pay

## 2020-09-10 NOTE — Telephone Encounter (Signed)
Patient is requesting a call from you to discuss post op precautions.

## 2020-09-15 ENCOUNTER — Other Ambulatory Visit: Payer: Self-pay | Admitting: Orthopaedic Surgery

## 2020-09-15 NOTE — Progress Notes (Deleted)
Office Visit Note  Patient: Jodi Gilmore             Date of Birth: April 03, 1955           MRN: [id number]             PCP: Eulis Foster, MD Referring: Donnal Moat* Visit Date: 09/16/2020   Subjective:  No chief complaint on file.   History of Present Illness: Jodi Gilmore is a 65 y.o. female here for follow up for multiple symptoms with dry eyes and mouth, joint pain and swelling, and skin rashes without clear diagnostic picture. Lab testing at initial visit demonstrated high CRP also very low TSH. 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 female here for evaluation with history of multiple rheumatic conditions apparently RA, sjogren's syndrome, scleroderma, and raynaud's and with history of hypothyroidism. She has a somewhat complicated history with numerous symptoms both focal symptoms including joint pain and swelling, eye and mouth dryness, skin lesions reportedly both psoriasis and behcets pathology previously seen. At the moment skin disease is not very active. Joint pain is worst in the left hip with severe degenerative arthritis already seen at orthopedic surgery clinic. She also has osteoarthritis of other sites with worsening changes of fingers on bilateral hands. However she does have multiple generalized symptoms and has had labile thyroid disease with difficult to control  levothyroxine dose changes. Chronic dry eyes and mouth with what sounds like possible abrasion but no past positive biopsy specific for sjogren syndrome. Previous autoimmune serology has been somewhat variable, with mixed positive markers and sometimes negative ANA. CRP has been persistently elevated. She reports occasional oral ulcers usually on sides. She denies lymphadenopathy, focal alopecia, photosensitive skin rashes or residual hyperpigmentation, or history of  blood clots.   Previous patient of Dr. Annamaria Boots with [facility] problems treated reviewed including ADD, hypothyroidism, chronic back pain, fatigue, depression, and vitamin D deficiency.   Labs reviewed 05/2020 TSH 13.4 fT4 1.07   02/2020 ANA neg RNA Poly III neg Scl-70 neg CCP 84 hsCRP 7.98 HLA-B27 neg BMP eGFR 52   08/2018 hsCRP >10.0 TSH 20.28     Imaging reviewed 06/19/20 MRI Lumbar spine IMPRESSION: Convex right scoliosis. Right greater than left subarticular recess narrowing at L1-2 due to bilateral disc protrusions. Mild left subarticular recess and moderate left foraminal narrowing at L2-3. Left subarticular recess and foraminal protrusion at L3-4 could impact the descending left L4 root and causes mild to moderate left foraminal narrowing. Moderately severe left and mild-to-moderate right foraminal narrowing at L4-5 where there is moderate central canal stenosis. Mild to moderate right foraminal narrowing L5-S1. The central canal and left foramen are open.   05/08/20 Xray left hip Severe arthritic changes of the left hip.   05/08/20 Xray left knee Minimal arthritic changes of the left knee. No joint effusion. The soft tissues are unremarkable.   No Rheumatology ROS completed.   PMFS History:  Patient Active Problem List   Diagnosis Date Noted   Bilateral hand pain 09/01/2020   Lower back injury 09/01/2020   Idiopathic scoliosis of lumbar spine 09/01/2020   Bipolar affective disorder, current episode mixed ([place]) 08/29/2020   Primary osteoarthritis of left hip 07/30/2020   Essential (primary) hypertension 07/17/2020   HSV (herpes simplex virus) anogenital infection 06/26/2020   Generalized anxiety disorder 06/04/2020   Attention deficit hyperactivity disorder (ADHD)  06/04/2020   Migraine headache with aura 05/17/2020   Sinusitis chronic, frontal 05/17/2020   HLD (hyperlipidemia) 05/17/2020   Elevated C-reactive protein (CRP) 02/13/2020   Other  specified arthritis, left knee 02/13/2020   Hip arthritis 02/13/2020   Bipolar disorder ([place]) 02/13/2020   Hypothyroidism (acquired) 01/03/2020   Diarrhea in adult patient 01/03/2020   Brain fog 01/03/2020   Healthcare maintenance 01/03/2020    Past Medical History:  Diagnosis Date   Allergy    Anxiety    situational    Asthma    yeras ago - not current    Behcet's disease ([place])    Bell's palsy    Cataract    both removed    Depression    situational -    Diplopia    Elevated blood pressure reading    no BP meds currently    GERD (gastroesophageal reflux disease)    Graves disease 1991   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    Hip pain    HLA B27 (HLA B27 positive)    Hyperlipidemia    Knee pain    Memory changes    Morphea scleroderma    Neuromuscular disorder (HCC)    Raynauds    Psoriatic arthritis (HCC)    seen in ears only   Raynaud's disease    Sjogren syndrome with dental involvement     Family History  Problem Relation Age of Onset   Heart disease Mother    Congestive Heart Failure Mother    Dementia Mother    Heart disease Father    Heart attack Father    Dementia Sister    Hemachromatosis Sister    Parkinson's disease Sister    Allergies Son    Healthy Son    Colon cancer Neg Hx    Colon polyps Neg Hx    Esophageal cancer Neg Hx    Stomach cancer Neg Hx    Rectal cancer Neg Hx    Past Surgical History:  Procedure Laterality Date   laproscopy     SALPINGECTOMY  1980   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     Social History   Social History Narrative   Not on file   Immunization History  Administered Date(s) Administered   Moderna Sars-Covid-2 Vaccination 11/29/2019, 12/25/2019   PFIZER Comirnaty([place])Covid-19 Tri-Sucrose Vaccine 06/24/2020     Objective: Vital Signs: There were no vitals taken for this visit.   Physical Exam   Musculoskeletal Exam: ***  CDAI Exam: CDAI Score: -- Patient  Global: --; Provider Global: -- Swollen: --; Tender: -- Joint Exam 09/16/2020   No joint exam has been documented for this visit   There is currently no information documented on the homunculus. Go to the Rheumatology activity and complete the homunculus joint exam.  Investigation: No additional findings.  Imaging: XR Hand 2 View Left  Result Date: 09/02/2020 Xray left hand 2 views Radiocarpal joint space appears normal. Few cystic changes in carpal bones. 1st CMC degenerative change less advanced compared to right hand. MCP and PIP joint spaces appear normal. DIP degenerative arthritis in 3rd DIP. 2nd DIP more severe disease some erosion especially of base of distal phalanx. No juxta articular demineralization is visible. 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 Multiple cystic changes  present in carpal bones. Moderately advancde 1st CMC joint degenerative arthritis. MCP and PIP joint spaces appear preserved/ DIP narrowing with 2nd digit probable erosive changes as well. No juxtaarticular demineralization. Impression Osteoarthritis changes of first CMC joint and DIP joints, suspicious for erosive OA changes at the 2nd DIP.   Recent Labs: Lab Results  Component Value Date   WBC 7.6 01/03/2020   HGB 13.9 01/03/2020   PLT 363 01/03/2020   NA 137 05/15/2020   K 4.4 05/15/2020   CL 98 05/15/2020   CO2 21 05/15/2020   GLUCOSE 84 05/15/2020   BUN 18 05/15/2020   CREATININE 0.93 05/15/2020   BILITOT 0.2 01/01/2020   ALKPHOS 128 (H) 01/01/2020   AST 33 01/01/2020   ALT 42 (H) 01/01/2020   PROT 7.5 01/01/2020   ALBUMIN 4.9 (H) 01/01/2020   CALCIUM 10.0 05/15/2020   GFRAA 60 02/12/2020    Speciality Comments: No specialty comments available.  Procedures:  No procedures performed Allergies: Contrast  media [iodinated diagnostic agents], Prohance [gadoteridol], Latex, Ptu [propylthiouracil], Sulfa antibiotics, and Tapazole [methimazole]   Assessment / Plan:     Visit Diagnoses: No diagnosis found.  ***  Orders: No orders of the defined types were placed in this encounter.  No orders of the defined types were placed in this encounter.    Follow-Up Instructions: No follow-ups on file.   Collier Salina, MD  Note - This record has been created using [employer].  Chart creation errors have been sought, but may not always  have been located. Such creation errors do not reflect on  the standard of medical care.

## 2020-09-16 ENCOUNTER — Other Ambulatory Visit: Payer: Self-pay

## 2020-09-16 ENCOUNTER — Ambulatory Visit: Payer: 59 | Admitting: Internal Medicine

## 2020-09-16 NOTE — Telephone Encounter (Signed)
ok 

## 2020-09-18 ENCOUNTER — Other Ambulatory Visit: Payer: Self-pay | Admitting: Family Medicine

## 2020-09-18 ENCOUNTER — Other Ambulatory Visit (HOSPITAL_COMMUNITY): Payer: Self-pay | Admitting: Physician Assistant

## 2020-09-18 DIAGNOSIS — F411 Generalized anxiety disorder: Secondary | ICD-10-CM

## 2020-09-18 DIAGNOSIS — F3160 Bipolar disorder, current episode mixed, unspecified: Secondary | ICD-10-CM

## 2020-09-19 ENCOUNTER — Encounter: Payer: Self-pay | Admitting: Orthopaedic Surgery

## 2020-09-22 ENCOUNTER — Other Ambulatory Visit: Payer: Self-pay | Admitting: Orthopaedic Surgery

## 2020-09-22 ENCOUNTER — Other Ambulatory Visit: Payer: Self-pay | Admitting: Family Medicine

## 2020-09-22 NOTE — Telephone Encounter (Signed)
ok 

## 2020-09-24 ENCOUNTER — Other Ambulatory Visit (HOSPITAL_COMMUNITY): Payer: Self-pay | Admitting: Physician Assistant

## 2020-09-24 ENCOUNTER — Telehealth (HOSPITAL_COMMUNITY): Payer: Self-pay | Admitting: *Deleted

## 2020-09-24 DIAGNOSIS — F909 Attention-deficit hyperactivity disorder, unspecified type: Secondary | ICD-10-CM

## 2020-09-24 MED ORDER — AMPHETAMINE-DEXTROAMPHETAMINE 5 MG PO TABS: 5.0000 mg | ORAL_TABLET | Freq: Every day | ORAL | 0 refills | Status: DC

## 2020-09-24 NOTE — Telephone Encounter (Signed)
Provider contacted by Suzanne K. Beck, RN regarding patient's Adderall prescription. Provider to send medications to preferred pharmacy. 

## 2020-09-24 NOTE — Telephone Encounter (Signed)
Faxed request for patients adderall rx.from [facility] pharmacy. Reveiwed record and she will soon be out of it. She was seen recently in Aug by [place] PA. Will bring this request to [place] attention to e sign to her pharmacy.

## 2020-09-24 NOTE — Progress Notes (Signed)
    SUBJECTIVE:   CHIEF COMPLAINT / HPI: follow up thyroid labs   Hypothyroidism Patient recently had dose change in June 2022. She reports tolerating the dose change. She is requesting recheck of her thyroid levels to see how she has responded to new dose. She reports that she has noticed that her hair is not falling out as much. She has the thermostat down to 62 deg as she reports that she is still hot. She reports that her symptoms always seem to be opposite of what would be expected.   Back and Left Knee Pain Patient reports that she continues to have significant back and hip pain. She has established with the pain clinic as of 9/14 and was started hydrocodone. She is concerned about the back pain due to walking.  She reports that she has left knee pain that has been progressively worsening for years. She states that the knee pain has been worsening to the point that she feels like her leg collapses. She reports nearly falling due to this. She reports some limited range of motion om the left knee. She has more pain with bending of the knee and trying to stand afterwards. Extending the knee helps with pain. She reports that the voltaren gel helps too. Pain is front of the knee. She reports not noticing swelling.  Pain affects her sleep.   She reports that she is having nightmares and is wondering if the melatonin could be associated with this. She reports watching violent shows that she will often fall asleep before. The melatonin   HM  Tetanus, Shingles and flu vaccine.   PERTINENT  PMH / PSH:  Hypothyroidism  Bipolar disorder   OBJECTIVE:   BP 116/72   Pulse 84   Ht 5\' 6"  (1.676 m)   Wt 168 lb (76.2 kg)   SpO2 94%   BMI 27.12 kg/m   Physical Exam Constitutional:      General: She is not in acute distress.    Appearance: Normal appearance. She is not ill-appearing.  Musculoskeletal:     Cervical back: No tenderness.     Right knee: Normal.     Left knee: No swelling,  deformity, effusion, erythema, ecchymosis, lacerations or crepitus. Decreased range of motion. Tenderness present over the medial joint line and lateral joint line. No patellar tendon tenderness. No LCL laxity, MCL laxity, ACL laxity or PCL laxity.Normal meniscus and normal patellar mobility. Normal pulse.     Instability Tests: Anterior drawer test positive. Posterior drawer test negative. Anterior Lachman test negative.  Lymphadenopathy:     Cervical: No cervical adenopathy.  Neurological:     Mental Status: She is alert.    ASSESSMENT/PLAN:   Other specified arthritis, left knee Suspect patient may have meniscus degeneration/injury given exam today as well as arthritis  Will refer to sports medicine, suspect she will need a MRI in the future  Will order left knee xrays today   Healthcare maintenance Will send printed scripts for Tdap and Shingrix vaccines   Hypothyroidism (acquired) Patient reports minimally improved symptoms from recent dose change  Will recheck TSH, T4 and T3 per patient request     , MD [facility]

## 2020-09-24 NOTE — Progress Notes (Signed)
Provider contacted by Orpah Clinton. Reola Calkins, RN regarding patient's Adderall prescription. Provider to send medications to preferred pharmacy.

## 2020-09-25 ENCOUNTER — Ambulatory Visit (INDEPENDENT_AMBULATORY_CARE_PROVIDER_SITE_OTHER): Payer: Medicare Other | Admitting: Family Medicine

## 2020-09-25 ENCOUNTER — Encounter: Payer: Self-pay | Admitting: Family Medicine

## 2020-09-25 ENCOUNTER — Other Ambulatory Visit: Payer: Self-pay

## 2020-09-25 VITALS — BP 116/72 | HR 84 | Ht 66.0 in | Wt 168.0 lb

## 2020-09-25 DIAGNOSIS — M25362 Other instability, left knee: Secondary | ICD-10-CM

## 2020-09-25 DIAGNOSIS — Z0000 Encounter for general adult medical examination without abnormal findings: Secondary | ICD-10-CM | POA: Diagnosis present

## 2020-09-25 DIAGNOSIS — M25562 Pain in left knee: Secondary | ICD-10-CM

## 2020-09-25 DIAGNOSIS — M13862 Other specified arthritis, left knee: Secondary | ICD-10-CM | POA: Diagnosis not present

## 2020-09-25 DIAGNOSIS — G8929 Other chronic pain: Secondary | ICD-10-CM | POA: Diagnosis not present

## 2020-09-25 DIAGNOSIS — E039 Hypothyroidism, unspecified: Secondary | ICD-10-CM | POA: Diagnosis not present

## 2020-09-25 MED ORDER — RIZATRIPTAN BENZOATE 10 MG PO TBDP
10.0000 mg | ORAL_TABLET | ORAL | 0 refills | Status: DC | PRN
Start: 2020-09-25 — End: 2021-01-13

## 2020-09-25 MED ORDER — RIZATRIPTAN BENZOATE 10 MG PO TABS: 10.0000 mg | ORAL_TABLET | ORAL | 0 refills | Status: DC | PRN

## 2020-09-25 MED ORDER — TETANUS-DIPHTH-ACELL PERTUSSIS 5-2.5-18.5 LF-MCG/0.5 IM SUSY: 0.5000 mL | PREFILLED_SYRINGE | Freq: Once | INTRAMUSCULAR | 0 refills | Status: AC

## 2020-09-25 MED ORDER — SHINGRIX 50 MCG/0.5ML IM SUSR: 0.5000 mL | Freq: Once | INTRAMUSCULAR | 0 refills | Status: AC

## 2020-09-25 MED ORDER — DICLOFENAC SODIUM 1 % EX GEL: 4.0000 g | Freq: Four times a day (QID) | CUTANEOUS | 4 refills | Status: DC

## 2020-09-25 NOTE — Patient Instructions (Addendum)
For your left knee pain and instability, I have submitted a referral to sports medicine.  Please been looking for a call from them in the next few weeks to schedule an appointment for evaluation.  Given your symptoms, we will order x-rays of your left knee to be completed when you are able to however I think that ultimately you will likely need a MRI of your knee to which sports medicine will be able to help navigate treatment planning.  I have also submitted a prescription for Voltaren gel as it helps with your pain.  Please follow-up with me after your hip surgery and we can check to see how you are doing with rehabilitation.  Today we will collect blood work to test her thyroid levels to see how they responded to the medication change.  If they continue to be abnormal, we will need to continue to adjust your medication doses.  I will notify you of any changes that we need to make.

## 2020-09-26 ENCOUNTER — Encounter: Payer: Self-pay | Admitting: Family Medicine

## 2020-09-26 LAB — T3, FREE: T3, Free: 2.6 pg/mL (ref 2.0–4.4)

## 2020-09-26 LAB — TSH: TSH: 0.09 u[IU]/mL — ABNORMAL LOW (ref 0.450–4.500)

## 2020-09-26 LAB — T4, FREE: Free T4: 1.49 ng/dL (ref 0.82–1.77)

## 2020-09-28 ENCOUNTER — Other Ambulatory Visit: Payer: Self-pay | Admitting: Orthopaedic Surgery

## 2020-09-28 NOTE — Assessment & Plan Note (Signed)
>>  ASSESSMENT AND PLAN FOR OTHER SPECIFIED ARTHRITIS, LEFT KNEE WRITTEN ON 09/28/2020  4:21 PM BY Ronnald Ramp, MD  Suspect patient may have meniscus degeneration/injury given exam today as well as arthritis  Will refer to sports medicine, suspect she will need a MRI in the future  Will order left knee xrays today

## 2020-09-28 NOTE — Assessment & Plan Note (Signed)
Will send printed scripts for Tdap and Shingrix vaccines

## 2020-09-28 NOTE — Assessment & Plan Note (Signed)
Suspect patient may have meniscus degeneration/injury given exam today as well as arthritis  Will refer to sports medicine, suspect she will need a MRI in the future  Will order left knee xrays today

## 2020-09-28 NOTE — Assessment & Plan Note (Signed)
Patient reports minimally improved symptoms from recent dose change  Will recheck TSH, T4 and T3 per patient request

## 2020-09-29 NOTE — Telephone Encounter (Signed)
ok 

## 2020-09-30 ENCOUNTER — Encounter: Payer: Self-pay | Admitting: Orthopaedic Surgery

## 2020-09-30 ENCOUNTER — Encounter: Payer: Self-pay | Admitting: Family Medicine

## 2020-10-01 ENCOUNTER — Other Ambulatory Visit: Payer: Self-pay | Admitting: Family Medicine

## 2020-10-01 DIAGNOSIS — E039 Hypothyroidism, unspecified: Secondary | ICD-10-CM

## 2020-10-01 MED ORDER — LEVOTHYROXINE SODIUM 88 MCG PO TABS: 88.0000 ug | ORAL_TABLET | ORAL | 0 refills | Status: DC

## 2020-10-01 NOTE — Progress Notes (Signed)
Synthroid dose decreased to from 112, TSH 0.09 from 0.01. Patient informed via MyChart

## 2020-10-03 ENCOUNTER — Other Ambulatory Visit: Payer: Self-pay | Admitting: Physician Assistant

## 2020-10-03 NOTE — Progress Notes (Signed)
Sent message, via epic in basket, requesting orders in epic from surgeon.  

## 2020-10-03 NOTE — Patient Instructions (Signed)
DUE TO COVID-19 ONLY ONE VISITOR IS ALLOWED TO COME WITH YOU AND STAY IN THE WAITING ROOM ONLY DURING PRE OP AND PROCEDURE.   **NO VISITORS ARE ALLOWED IN THE SHORT STAY AREA OR RECOVERY ROOM!!**  IF YOU WILL BE ADMITTED INTO THE HOSPITAL YOU ARE ALLOWED ONLY TWO SUPPORT PEOPLE DURING VISITATION HOURS ONLY (10AM -8PM)   The support person(s) may change daily. The support person(s) must pass our screening, gel in and out, and wear a mask at all times, including in the patient's room. Patients must also wear a mask when staff or their support person are in the room.  No visitors under the age of 27. Any visitor under the age of 81 must be accompanied by an adult.    COVID SWAB TESTING MUST BE COMPLETED ON:  10/15/20 **MUST PRESENT COMPLETED FORM AT TESTING SITE**    [address] [place] (backside of the building) You are not required to quarantine, however you are required to wear a well-fitted mask when you are out and around people not in your household.  Hand Hygiene often Do NOT share personal items Notify your provider if you are in close contact with someone who has COVID or you develop fever 100.4 or greater, new onset of sneezing, cough, sore throat, shortness of breath or body aches.  [facility] En[facility] [address], [address], must go inside of the hospital, NOT A DRIVE THRU!  (Must self quarantine after testing. Follow instructions on handout.)       Your procedure is scheduled on: 10/17/20   Report to [facility] Main Entrance    Report to admitting at: 7:00 AM   Call this number if you have problems the morning of surgery [phone]   Do not eat food :After Midnight.   May have liquids until : 6:45 AM   day of surgery  CLEAR LIQUID DIET  Foods Allowed                                                                     Foods Excluded  Water, Black Coffee and tea, regular and decaf                              liquids that you cannot  Plain Jell-O in any flavor  (No red)                                           see through such as: Fruit ices (not with fruit pulp)                                     milk, soups, orange juice              Iced Popsicles (No red)  All solid food                                   Apple juices Sports drinks like Gatorade (No red) Lightly seasoned clear broth or consume(fat free) Sugar,   Sample Menu Breakfast                                Lunch                                     Supper Cranberry juice                    Beef broth                            Chicken broth Jell-O                                     Grape juice                           Apple juice Coffee or tea                        Jell-O                                      Popsicle                                                Coffee or tea                        Coffee or tea      Complete one Ensure drink the morning of surgery at : 6:45 AM      the day of surgery.      The day of surgery:  Drink ONE (1) Pre-Surgery Clear Ensure or G2 by am the morning of surgery. Drink in one sitting. Do not sip.  This drink was given to you during your hospital  pre-op appointment visit. Nothing else to drink after completing the  Pre-Surgery Clear Ensure or G2.          If you have questions, please contact your surgeon's office.     Oral Hygiene is also important to reduce your risk of infection.                                    Remember - BRUSH YOUR TEETH THE MORNING OF SURGERY WITH YOUR REGULAR TOOTHPASTE   Do NOT smoke after Midnight   Take these medicines the morning of surgery with A SIP OF WATER: fluoxetine,cetirizine,synthroid,omeprazole.Rizatriptan as needed.  DO NOT TAKE ANY ORAL DIABETIC MEDICATIONS DAY OF YOUR SURGERY  You may not have any metal on your body including hair pins, jewelry, and body piercing              Do not wear make-up, lotions, powders, perfumes/cologne, or deodorant  Do not wear nail polish including gel and S&S, artificial/acrylic nails, or any other type of covering on natural nails including finger and toenails. If you have artificial nails, gel coating, etc. that needs to be removed by a nail salon please have this removed prior to surgery or surgery may need to be canceled/ delayed if the surgeon/ anesthesia feels like they are unable to be safely monitored.   Do not shave  48 hours prior to surgery.    Do not bring valuables to the hospital. [place].   Contacts, dentures or bridgework may not be worn into surgery.   Bring small overnight bag day of surgery.    Patients discharged on the day of surgery will not be allowed to drive home.   Special Instructions: Bring a copy of your healthcare power of attorney and living will documents         the day of surgery if you haven't scanned them before.              Please read over the following fact sheets you were given: IF YOU HAVE QUESTIONS ABOUT YOUR PRE-OP INSTRUCTIONS PLEASE CALL [phone]   [facility] - Preparing for Surgery Before surgery, you can play an important role.  Because skin is not sterile, your skin needs to be as free of germs as possible.  You can reduce the number of germs on your skin by washing with CHG (chlorahexidine gluconate) soap before surgery.  CHG is an antiseptic cleaner which kills germs and bonds with the skin to continue killing germs even after washing. Please DO NOT use if you have an allergy to CHG or antibacterial soaps.  If your skin becomes reddened/irritated stop using the CHG and inform your nurse when you arrive at Short Stay. Do not shave (including legs and underarms) for at least 48 hours prior to the first CHG shower.  You may shave your face/neck. Please follow these instructions carefully:  1.  Shower with CHG Soap the night  before surgery and the  morning of Surgery.  2.  If you choose to wash your hair, wash your hair first as usual with your  normal  shampoo.  3.  After you shampoo, rinse your hair and body thoroughly to remove the  shampoo.                           4.  Use CHG as you would any other liquid soap.  You can apply chg directly  to the skin and wash                       Gently with a scrungie or clean washcloth.  5.  Apply the CHG Soap to your body ONLY FROM THE NECK DOWN.   Do not use on face/ open                           Wound or open sores. Avoid contact with eyes, ears mouth and genitals (private parts).  Wash face,  Genitals (private parts) with your normal soap.             6.  Wash thoroughly, paying special attention to the area where your surgery  will be performed.  7.  Thoroughly rinse your body with warm water from the neck down.  8.  DO NOT shower/wash with your normal soap after using and rinsing off  the CHG Soap.                9.  Pat yourself dry with a clean towel.            10.  Wear clean pajamas.            11.  Place clean sheets on your bed the night of your first shower and do not  sleep with pets. Day of Surgery : Do not apply any lotions/deodorants the morning of surgery.  Please wear clean clothes to the hospital/surgery center.  FAILURE TO FOLLOW THESE INSTRUCTIONS MAY RESULT IN THE CANCELLATION OF YOUR SURGERY PATIENT SIGNATURE_________________________________  NURSE SIGNATURE__________________________________  ________________________________________________________________________   Jodi Gilmore  An incentive spirometer is a tool that can help keep your lungs clear and active. This tool measures how well you are filling your lungs with each breath. Taking long deep breaths may help reverse or decrease the chance of developing breathing (pulmonary) problems (especially infection) following: A long period of time when you are unable  to move or be active. BEFORE THE PROCEDURE  If the spirometer includes an indicator to show your best effort, your nurse or respiratory therapist will set it to a desired goal. If possible, sit up straight or lean slightly forward. Try not to slouch. Hold the incentive spirometer in an upright position. INSTRUCTIONS FOR USE  Sit on the edge of your bed if possible, or sit up as far as you can in bed or on a chair. Hold the incentive spirometer in an upright position. Breathe out normally. Place the mouthpiece in your mouth and seal your lips tightly around it. Breathe in slowly and as deeply as possible, raising the piston or the ball toward the top of the column. Hold your breath for 3-5 seconds or for as long as possible. Allow the piston or ball to fall to the bottom of the column. Remove the mouthpiece from your mouth and breathe out normally. Rest for a few seconds and repeat Steps 1 through 7 at least 10 times every 1-2 hours when you are awake. Take your time and take a few normal breaths between deep breaths. The spirometer may include an indicator to show your best effort. Use the indicator as a goal to work toward during each repetition. After each set of 10 deep breaths, practice coughing to be sure your lungs are clear. If you have an incision (the cut made at the time of surgery), support your incision when coughing by placing a pillow or rolled up towels firmly against it. Once you are able to get out of bed, walk around indoors and cough well. You may stop using the incentive spirometer when instructed by your caregiver.  RISKS AND COMPLICATIONS Take your time so you do not get dizzy or light-headed. If you are in pain, you may need to take or ask for pain medication before doing incentive spirometry. It is harder to take a deep breath if you are having pain. AFTER USE Rest and breathe slowly and easily. It can be helpful to keep track of  a log of your progress. Your caregiver  can provide you with a simple table to help with this. If you are using the spirometer at home, follow these instructions: [place] IF:  You are having difficultly using the spirometer. You have trouble using the spirometer as often as instructed. Your pain medication is not giving enough relief while using the spirometer. You develop fever of 100.5 F (38.1 C) or higher. SEEK IMMEDIATE MEDICAL CARE IF:  You cough up bloody sputum that had not been present before. You develop fever of 102 F (38.9 C) or greater. You develop worsening pain at or near the incision site. MAKE SURE YOU:  Understand these instructions. Will watch your condition. Will get help right away if you are not doing well or get worse. Document Released: 05/10/2006 Document Revised: 03/22/2011 Document Reviewed: 07/11/2006 [facility] Patient Information 2014 [place], [place].   ________________________________________________________________________

## 2020-10-06 ENCOUNTER — Other Ambulatory Visit: Payer: Self-pay | Admitting: Orthopaedic Surgery

## 2020-10-06 ENCOUNTER — Ambulatory Visit: Payer: Medicare Other | Admitting: Orthopaedic Surgery

## 2020-10-06 NOTE — Telephone Encounter (Signed)
ok 

## 2020-10-07 ENCOUNTER — Other Ambulatory Visit: Payer: Self-pay

## 2020-10-07 ENCOUNTER — Encounter: Payer: Self-pay | Admitting: Family Medicine

## 2020-10-07 ENCOUNTER — Encounter (HOSPITAL_COMMUNITY)
Admission: RE | Admit: 2020-10-07 | Discharge: 2020-10-07 | Disposition: A | Discharge status: Home / Self Care | Payer: Medicare Other | Source: Ambulatory Visit | Attending: Orthopaedic Surgery | Admitting: Orthopaedic Surgery

## 2020-10-07 ENCOUNTER — Encounter: Payer: Self-pay | Admitting: Orthopaedic Surgery

## 2020-10-07 ENCOUNTER — Encounter (HOSPITAL_COMMUNITY): Payer: Self-pay

## 2020-10-07 DIAGNOSIS — Z01812 Encounter for preprocedural laboratory examination: Secondary | ICD-10-CM | POA: Insufficient documentation

## 2020-10-07 HISTORY — DX: Cardiac murmur, unspecified: R01.1

## 2020-10-07 HISTORY — DX: Pneumonia, unspecified organism: J18.9

## 2020-10-07 HISTORY — DX: Family history of other specified conditions: Z84.89

## 2020-10-07 HISTORY — DX: Essential (primary) hypertension: I10

## 2020-10-07 LAB — BASIC METABOLIC PANEL
Anion gap: 11 (ref 5–15)
BUN: 26 mg/dL — ABNORMAL HIGH (ref 8–23)
CO2: 24 mmol/L (ref 22–32)
Calcium: 9.9 mg/dL (ref 8.9–10.3)
Chloride: 104 mmol/L (ref 98–111)
Creatinine, Ser: 0.84 mg/dL (ref 0.44–1.00)
GFR, Estimated: >60 mL/min (ref >60)
Glucose, Bld: 103 mg/dL — ABNORMAL HIGH (ref 70–99)
Potassium: 4.4 mmol/L (ref 3.5–5.1)
Sodium: 139 mmol/L (ref 135–145)

## 2020-10-07 LAB — CBC
HCT: 41.4 % (ref 36.0–46.0)
Hemoglobin: 13.8 g/dL (ref 12.0–15.0)
MCH: 29.2 pg (ref 26.0–34.0)
MCHC: 33.3 g/dL (ref 30.0–36.0)
MCV: 87.7 fL (ref 80.0–100.0)
Platelets: 412 10*3/uL — ABNORMAL HIGH (ref 150–400)
RBC: 4.72 MIL/uL (ref 3.87–5.11)
RDW: 12.7 % (ref 11.5–15.5)
WBC: 10.8 10*3/uL — ABNORMAL HIGH (ref 4.0–10.5)
nRBC: 0 % (ref 0.0–0.2)

## 2020-10-07 LAB — SURGICAL PCR SCREEN
MRSA, PCR: NEGATIVE
Staphylococcus aureus: POSITIVE — AB

## 2020-10-07 NOTE — Progress Notes (Addendum)
COVID Vaccine Completed: Yes Date COVID Vaccine completed: 06/24/20 COVID vaccine manufacturer: Pfizer x 1     Moderna x 2 COVID Test: 10/15/20  PCP - Dr. Neita Garnet. LOV: 09/25/20 Cardiologist -   Chest x-ray -  EKG - 01/07/20 EPIC Stress Test -  ECHO -  Cardiac Cath -  Pacemaker/ICD device last checked:  Sleep Study -  CPAP -   Fasting Blood Sugar -  Checks Blood Sugar _____ times a day  Blood Thinner Instructions: Aspirin Instructions: Last Dose:  Anesthesia review:   Patient denies shortness of breath, fever, cough and chest pain at PAT appointment   Patient verbalized understanding of instructions that were given to them at the PAT appointment. Patient was also instructed that they will need to review over the PAT instructions again at home before surgery.

## 2020-10-08 ENCOUNTER — Encounter: Payer: Self-pay | Admitting: Orthopaedic Surgery

## 2020-10-08 ENCOUNTER — Telehealth: Payer: Self-pay | Admitting: *Deleted

## 2020-10-08 ENCOUNTER — Ambulatory Visit (INDEPENDENT_AMBULATORY_CARE_PROVIDER_SITE_OTHER): Payer: Medicare Other | Admitting: Family Medicine

## 2020-10-08 VITALS — Ht 65.0 in | Wt 168.0 lb

## 2020-10-08 DIAGNOSIS — M25562 Pain in left knee: Secondary | ICD-10-CM | POA: Diagnosis present

## 2020-10-08 DIAGNOSIS — G8929 Other chronic pain: Secondary | ICD-10-CM | POA: Insufficient documentation

## 2020-10-08 MED ORDER — METHYLPREDNISOLONE ACETATE 40 MG/ML IJ SUSP
40.0000 mg | Freq: Once | INTRAMUSCULAR | Status: AC
  Administered 2020-10-08: 40 mg via INTRA_ARTICULAR

## 2020-10-08 NOTE — Patient Instructions (Signed)
Your pain is due to a meniscus tear, less likely arthritis that's worse than what is seen on the x-rays. These are the different medications you can take for this: Tylenol 500mg  1-2 tabs three times a day for pain. Capsaicin, aspercreme, or biofreeze topically up to four times a day may also help with pain. Cortisone injections are an option - you were given this today. Check your paperwork to ensure you don't take something prior to surgery that Dr. wanted you to stop. It's important that you continue to stay active. Straight leg raises, knee extensions 3 sets of 10 once a day (add ankle weight if these become too easy). Consider physical therapy to strengthen muscles around the joint that hurts to take pressure off of the joint itself. Shoe inserts with good arch support may be helpful. Heat or ice 15 minutes at a time 3-4 times a day as needed to help with pain. Call me if you're not improving and you want to go ahead with the MRI. Good luck with the hip surgery!

## 2020-10-08 NOTE — Assessment & Plan Note (Addendum)
Potential worsening arthritis or degenerative meniscus causing pain. In absence of recent injury and normal ligament exams, low suspicion for ACL/PCL medial or lateral collateral ligaments pathology. Patient agreeable to joint injection today in attempts to help with pain prior to left hip surgery.   Left Knee Injection  After informed written consent timeout was performed, patient was seated on exam table. The patient's left knee was prepped with 3 alcohol swabs and utilizing anterior medial approach, the patient's knee was injected intraarticularly with 3:1 lidocaine: depomedrol. Patient tolerated the procedure well without immediate complications.

## 2020-10-08 NOTE — Telephone Encounter (Signed)
Thank you :)

## 2020-10-08 NOTE — Progress Notes (Signed)
Lab results: PCR: STAPH positive.

## 2020-10-08 NOTE — Telephone Encounter (Signed)
Left patient VM requesting call back as she had questions regarding her upcoming surgery.

## 2020-10-08 NOTE — Progress Notes (Signed)
   PCP: Ronnald Ramp, MD  Subjective:   HPI: Patient is a 65 y.o. female here for chronic knee pain.  Patient reports she has experienced anterior left knee pain that has worsened progressively over the last 6 months. She expresses concern as she is scheduled to have a hip replacement on 10/17/20 and worries that she will not be able to participate in rehab for her hip if her knee is so painful and makes it difficult for her to move well.  Patient states that she has pain with knee flexion.  Pain bothers her at night and often prevents her from sleeping.  She has been taking Norco and tramadol that minimally helps with pain.  She states that her knee often feels unstable making it difficult for her to walk long distances.  She states that she has worn a knee brace that helped her be able to sleep but did not help much with walking.  Patient is amenable to having steroid injections to the knee.  She denies any recent trauma to her knees and has not noticed any knee joint swelling as of late.       Objective:  Physical Exam:  Gen: awake, alert, NAD, comfortable in exam room Pulm: breathing unlabored  Left knee: - Inspection: no gross deformity. No swelling/effusion,no erythema or bruising. Skin intact - Palpation:  TTP along lateral joint line, some tenderness to palpation along medial joint line, notable crepitus with exam maneuvers - ROM: full active ROM with flexion and extension in knee and hip - Strength: 5/5 strength - Neuro/vasc: NV intact - Special Tests: - LIGAMENTS: negative anterior and posterior drawer, negative Lachman's, no MCL or LCL laxity, +Apley's test -- MENISCUS: + McMurray's -- PF JOINT: nml patellar mobility bilaterally.    Left hip: Patient reports pain with internal and external rotation of the knee    Assessment & Plan:    Chronic pain of left knee Potential worsening arthritis or degenerative meniscus causing pain. In absence of recent injury and  normal ligament exams, low suspicion for ACL/PCL medial or lateral collateral ligaments pathology. Patient agreeable to joint injection today in attempts to help with pain prior to left hip surgery.   Left Knee Injection  After informed written consent timeout was performed, patient was seated on exam table. The patient's left knee was prepped with 3 alcohol swabs and utilizing anterior medial approach, the patient's knee was injected intraarticularly with 3:1 lidocaine: depomedrol. Patient tolerated the procedure well without immediate complications.    No orders of the defined types were placed in this encounter.   Meds ordered this encounter  Medications   methylPREDNISolone acetate (DEPO-MEDROL) injection 40 mg    Ronnald Ramp, MD [facility], PGY-3 10/08/2020 2:53 PM

## 2020-10-09 ENCOUNTER — Encounter: Payer: Self-pay | Admitting: Orthopaedic Surgery

## 2020-10-10 ENCOUNTER — Encounter (HOSPITAL_COMMUNITY): Payer: Self-pay | Admitting: Physician Assistant

## 2020-10-11 ENCOUNTER — Encounter: Payer: Self-pay | Admitting: Family Medicine

## 2020-10-11 ENCOUNTER — Other Ambulatory Visit: Payer: Self-pay | Admitting: Orthopaedic Surgery

## 2020-10-13 ENCOUNTER — Encounter: Payer: Self-pay | Admitting: Family Medicine

## 2020-10-13 ENCOUNTER — Other Ambulatory Visit: Payer: Self-pay | Admitting: Family Medicine

## 2020-10-15 ENCOUNTER — Other Ambulatory Visit: Payer: Self-pay | Admitting: Orthopaedic Surgery

## 2020-10-16 DIAGNOSIS — M1612 Unilateral primary osteoarthritis, left hip: Secondary | ICD-10-CM

## 2020-10-16 DIAGNOSIS — Z96642 Presence of left artificial hip joint: Secondary | ICD-10-CM

## 2020-10-16 LAB — SARS CORONAVIRUS 2 (TAT 6-24 HRS): SARS Coronavirus 2: NEGATIVE

## 2020-10-16 NOTE — H&P (Signed)
TOTAL HIP ADMISSION H&P  Patient is admitted for left total hip arthroplasty.  Subjective:  Chief Complaint: left hip pain  HPI: Jodi Gilmore, 65 y.o. female, has a history of pain and functional disability in the left hip(s) due to arthritis and patient has failed non-surgical conservative treatments for greater than 12 weeks to include NSAID's and/or analgesics, flexibility and strengthening excercises, weight reduction as appropriate, and activity modification.  Onset of symptoms was gradual starting 3 years ago with gradually worsening course since that time.The patient noted no past surgery on the left hip(s).  Patient currently rates pain in the left hip at 10 out of 10 with activity. Patient has night pain, worsening of pain with activity and weight bearing, trendelenberg gait, pain that interfers with activities of daily living, and pain with passive range of motion. Patient has evidence of subchondral cysts, subchondral sclerosis, periarticular osteophytes, and joint space narrowing by imaging studies. This condition presents safety issues increasing the risk of falls.  There is no current active infection.  Patient Active Problem List   Diagnosis Date Noted   Unilateral primary osteoarthritis, left hip 10/16/2020   Chronic pain of left knee 10/08/2020   Bilateral hand pain 09/01/2020   Lower back injury 09/01/2020   Idiopathic scoliosis of lumbar spine 09/01/2020   Bipolar affective disorder, current episode mixed ([place]) 08/29/2020   Primary osteoarthritis of left hip 07/30/2020   Essential (primary) hypertension 07/17/2020   HSV (herpes simplex virus) anogenital infection 06/26/2020   Generalized anxiety disorder 06/04/2020   Attention deficit hyperactivity disorder (ADHD) 06/04/2020   Migraine headache with aura 05/17/2020   Sinusitis chronic, frontal 05/17/2020   HLD (hyperlipidemia) 05/17/2020   Elevated C-reactive protein (CRP) 02/13/2020   Other specified arthritis, left  knee 02/13/2020   Hip arthritis 02/13/2020   Bipolar disorder ([place]) 02/13/2020   Hypothyroidism (acquired) 01/03/2020   Diarrhea in adult patient 01/03/2020   Brain fog 01/03/2020   Healthcare maintenance 01/03/2020   Past Medical History:  Diagnosis Date   Allergy    Anxiety    situational    Asthma    yeras ago - not current    Behcet's disease ([place])    Bell's palsy    Cataract    both removed    Depression    situational -    Diplopia    Elevated blood pressure reading    no BP meds currently    Family history of adverse reaction to anesthesia    GERD (gastroesophageal reflux disease)    Graves disease 1991   Heart murmur    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    Hip pain    HLA B27 (HLA B27 positive)    Hyperlipidemia    Hypertension    Knee pain    Memory changes    Morphea scleroderma    Neuromuscular disorder ([place])    Raynauds    Pneumonia    Psoriatic arthritis ([place])    seen in ears only   Raynaud's disease    Sjogren syndrome with dental involvement     Past Surgical History:  Procedure Laterality Date   CYST REMOVAL HAND     left arm posterior   laproscopy     SALPINGECTOMY  1980   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      No current facility-administered medications for this encounter.   Current Outpatient Medications  Medication Sig Dispense Refill Last Dose   acetaminophen (TYLENOL) 325  MG tablet Take 650 mg by mouth every 6 (six) hours as needed for moderate pain.      amphetamine-dextroamphetamine (ADDERALL) 5 MG tablet Take 1 tablet (5 mg total) by mouth daily. 30 tablet 0    aspirin-acetaminophen-caffeine (EXCEDRIN MIGRAINE) 250-250-65 MG tablet Take 2 tablets by mouth every 6 (six) hours as needed for headache.      atorvastatin (LIPITOR) 40 MG tablet Take 1 tablet (40 mg total) by mouth daily. 90 tablet 3    B Complex Vitamins (VITAMIN B COMPLEX PO) Take 1 tablet by mouth daily.      Bacillus  Coagulans-Inulin (PROBIOTIC-PREBIOTIC PO) Take 1 capsule by mouth daily.      cetirizine (ZYRTEC ALLERGY) 10 MG tablet Take 1 tablet (10 mg total) by mouth daily. 90 tablet 1    Cholecalciferol (VITAMIN D) 125 MCG (5000 UT) CAPS Take 10,000 Units by mouth daily.      diclofenac Sodium (VOLTAREN) 1 % GEL Apply 4 g topically 4 (four) times daily. (Patient taking differently: Apply 4 g topically daily as needed (pain).) 150 g 4    FEVERFEW PO Take 1 tablet by mouth 3 (three) times daily.      FLUoxetine (PROZAC) 40 MG capsule Take 1 capsule (40 mg total) by mouth daily. 30 capsule 1    fluticasone (FLONASE) 50 MCG/ACT nasal spray Place 2 sprays into both nostrils daily. 16 g 6    glucosamine-chondroitin 500-400 MG tablet Take 1 tablet by mouth 3 (three) times daily.      HYDROcodone-acetaminophen (NORCO/VICODIN) 5-325 MG tablet Take 1 tablet by mouth at bedtime as needed for moderate pain.      hydroxypropyl methylcellulose / hypromellose (ISOPTO TEARS / GONIOVISC) 2.5 % ophthalmic solution Place 1 drop into both eyes 3 (three) times daily as needed for dry eyes.      levothyroxine (SYNTHROID) 88 MCG tablet Take 1 tablet (88 mcg total) by mouth every morning. 30 minutes before food 60 tablet 0    lisinopril (ZESTRIL) 2.5 MG tablet Take 1 tablet (2.5 mg total) by mouth at bedtime. 90 tablet 3    Magnesium Oxide (MAG-OX 400 PO) Take 400 mg by mouth in the morning and at bedtime.      Multiple Vitamin (MULTIVITAMIN) tablet Take 1 tablet by mouth daily.      Omega-3 Fatty Acids (OMEGA-3 EPA FISH OIL PO) Take 3 capsules by mouth daily.      omeprazole (PRILOSEC) 20 MG capsule TAKE ONE CAPSULE BY MOUTH IN THE MORNING AND AT BEDTIME 90 capsule 0    OVER THE COUNTER MEDICATION Take 4 capsules by mouth at bedtime. Calm Forte      Potassium Bicarbonate 99 MG CAPS Take 99 mg by mouth 3 (three) times daily.      RESTASIS 0.05 % ophthalmic emulsion INSTILL ONE DROP INTO EACH EYE TWICE DAILY 60 each 3     rizatriptan (MAXALT) 10 MG tablet Take 1 tablet (10 mg total) by mouth as needed for migraine. May repeat in 2 hours if needed 10 tablet 0    TURMERIC PO Take 1 capsule by mouth in the morning, at noon, and at bedtime.      ziprasidone (GEODON) 40 MG capsule Take 1 capsule (40 mg total) by mouth 2 (two) times daily with a meal. 60 capsule 1    Melatonin 1 MG SUBL       rizatriptan (MAXALT-MLT) 10 MG disintegrating tablet Take 1 tablet (10 mg total) by mouth as needed for  migraine. May repeat in 2 hours if needed (Patient not taking: No sig reported) 10 tablet 0 Not Taking   traMADol (ULTRAM) 50 MG tablet TAKE ONE TABLET BY MOUTH EVERY 6 HOURS AS NEEDED 30 tablet 0    Allergies  Allergen Reactions   Contrast Media [Iodinated Diagnostic Agents] Anaphylaxis   Prohance [Gadoteridol] Anaphylaxis   Ptu [Propylthiouracil]    Sulfa Antibiotics     Unknown reaction, family history   Tapazole [Methimazole]    Latex Hives and Rash    Social History   Tobacco Use   Smoking status: Former   Smokeless tobacco: Never  Substance Use Topics   Alcohol use: Not Currently    Family History  Problem Relation Age of Onset   Heart disease Mother    Congestive Heart Failure Mother    Dementia Mother    Heart disease Father    Heart attack Father    Dementia Sister    Hemachromatosis Sister    Parkinson's disease Sister    Allergies Son    Healthy Son    Colon cancer Neg Hx    Colon polyps Neg Hx    Esophageal cancer Neg Hx    Stomach cancer Neg Hx    Rectal cancer Neg Hx      Review of Systems  Musculoskeletal:  Positive for gait problem.  All other systems reviewed and are negative.  Objective:  Physical Exam Vitals reviewed.  Constitutional:      Appearance: Normal appearance.  HENT:     Head: Normocephalic and atraumatic.  Eyes:     Extraocular Movements: Extraocular movements intact.     Pupils: Pupils are equal, round, and reactive to light.  Cardiovascular:     Rate and  Rhythm: Normal rate and regular rhythm.  Pulmonary:     Effort: Pulmonary effort is normal.     Breath sounds: Normal breath sounds.  Abdominal:     Palpations: Abdomen is soft.  Musculoskeletal:     Cervical back: Normal range of motion and neck supple.     Left hip: Tenderness and bony tenderness present. Decreased range of motion. Decreased strength.  Neurological:     Mental Status: She is alert and oriented to person, place, and time.  Psychiatric:        Behavior: Behavior normal.    Vital signs in last 24 hours:    Labs:   Estimated body mass index is 27.96 kg/m as calculated from the following:   Height as of 10/08/20: $RemoveBef'5\' 5"'pLLFQWbKcp$  (1.651 m).   Weight as of 10/08/20: 76.2 kg.   Imaging Review Plain radiographs demonstrate severe degenerative joint disease of the left hip(s). The bone quality appears to be excellent for age and reported activity level.      Assessment/Plan:  End stage arthritis, left hip(s)  The patient history, physical examination, clinical judgement of the provider and imaging studies are consistent with end stage degenerative joint disease of the left hip(s) and total hip arthroplasty is deemed medically necessary. The treatment options including medical management, injection therapy, arthroscopy and arthroplasty were discussed at length. The risks and benefits of total hip arthroplasty were presented and reviewed. The risks due to aseptic loosening, infection, stiffness, dislocation/subluxation,  thromboembolic complications and other imponderables were discussed.  The patient acknowledged the explanation, agreed to proceed with the plan and consent was signed. Patient is being admitted for inpatient treatment for surgery, pain control, PT, OT, prophylactic antibiotics, VTE prophylaxis, progressive ambulation and ADL's and discharge  planning.The patient is planning to be discharged home with home health services

## 2020-10-17 ENCOUNTER — Encounter (HOSPITAL_COMMUNITY): Payer: Self-pay | Admitting: Orthopaedic Surgery

## 2020-10-17 ENCOUNTER — Ambulatory Visit (HOSPITAL_COMMUNITY): Payer: Medicare Other | Admitting: Physician Assistant

## 2020-10-17 ENCOUNTER — Ambulatory Visit (HOSPITAL_COMMUNITY): Payer: Medicare Other

## 2020-10-17 ENCOUNTER — Encounter (HOSPITAL_COMMUNITY)
Admission: RE | Disposition: A | Discharge status: Home / Self Care | Payer: Self-pay | Source: Ambulatory Visit | Attending: Orthopaedic Surgery

## 2020-10-17 ENCOUNTER — Observation Stay (HOSPITAL_COMMUNITY)
Admission: RE | Admit: 2020-10-17 | Discharge: 2020-10-19 | Disposition: A | Discharge status: Home / Self Care | Payer: Medicare Other | Source: Ambulatory Visit | Attending: Orthopaedic Surgery | Admitting: Orthopaedic Surgery

## 2020-10-17 ENCOUNTER — Telehealth (HOSPITAL_COMMUNITY): Payer: 59 | Admitting: Physician Assistant

## 2020-10-17 ENCOUNTER — Other Ambulatory Visit: Payer: Self-pay

## 2020-10-17 ENCOUNTER — Ambulatory Visit (HOSPITAL_COMMUNITY): Payer: Medicare Other | Admitting: Anesthesiology

## 2020-10-17 ENCOUNTER — Observation Stay (HOSPITAL_COMMUNITY): Payer: Medicare Other

## 2020-10-17 DIAGNOSIS — Z419 Encounter for procedure for purposes other than remedying health state, unspecified: Secondary | ICD-10-CM

## 2020-10-17 DIAGNOSIS — M1612 Unilateral primary osteoarthritis, left hip: Secondary | ICD-10-CM | POA: Diagnosis present

## 2020-10-17 DIAGNOSIS — J45909 Unspecified asthma, uncomplicated: Secondary | ICD-10-CM | POA: Diagnosis not present

## 2020-10-17 DIAGNOSIS — Z79899 Other long term (current) drug therapy: Secondary | ICD-10-CM | POA: Insufficient documentation

## 2020-10-17 DIAGNOSIS — I10 Essential (primary) hypertension: Secondary | ICD-10-CM | POA: Diagnosis not present

## 2020-10-17 DIAGNOSIS — Z96642 Presence of left artificial hip joint: Secondary | ICD-10-CM

## 2020-10-17 DIAGNOSIS — E039 Hypothyroidism, unspecified: Secondary | ICD-10-CM | POA: Insufficient documentation

## 2020-10-17 DIAGNOSIS — Z87891 Personal history of nicotine dependence: Secondary | ICD-10-CM | POA: Insufficient documentation

## 2020-10-17 HISTORY — PX: TOTAL HIP ARTHROPLASTY: SHX124

## 2020-10-17 LAB — ABO/RH: ABO/RH(D): O POS

## 2020-10-17 LAB — TYPE AND SCREEN
ABO/RH(D): O POS
Antibody Screen: NEGATIVE

## 2020-10-17 IMAGING — XA DG HIP (WITH PELVIS) OPERATIVE*L*
1 series · 7 of 7 positions shown · non-contrast
Comparison: [DATE]

FLUOROSCOPY TIME:  0 minute 16 seconds

Dose: 2.29 mGy

CLINICAL DATA: LEFT hip replacement

EXAM:
OPERATIVE LEFT HIP (WITH PELVIS IF PERFORMED) 5 VIEWS
TECHNIQUE: Fluoroscopic spot image(s) were submitted for interpretation
post-operatively.

[Series 1: unknown protocol · 7 of 7 slices shown]
[im 1/7]
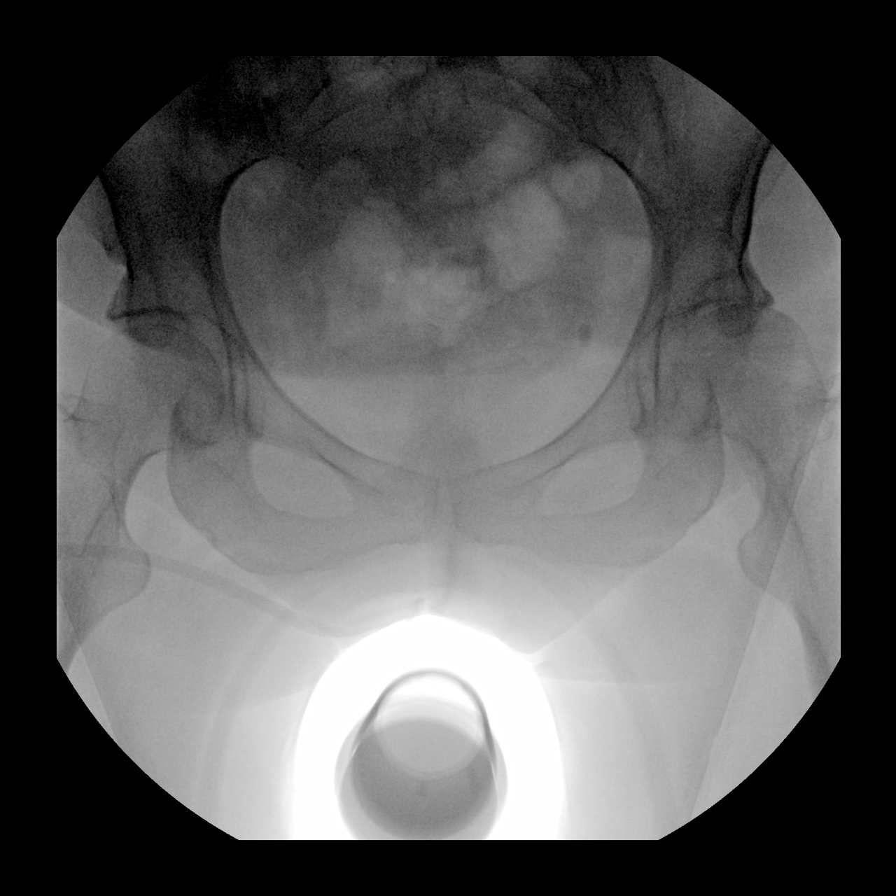
[im 2/7]
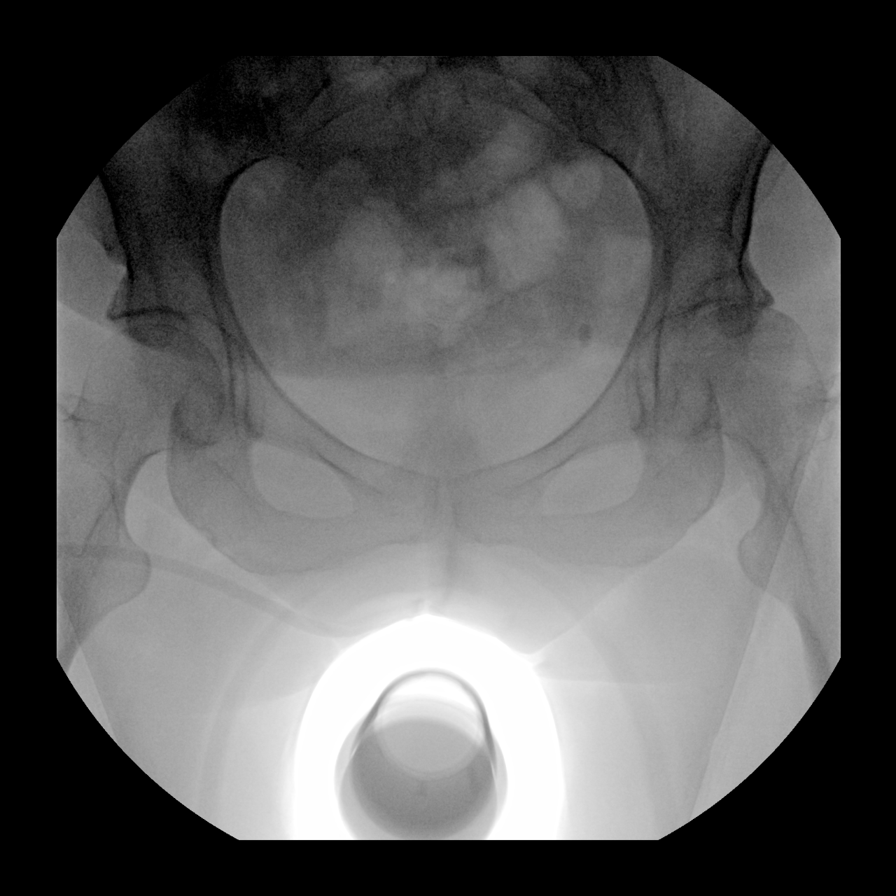
[im 3/7]
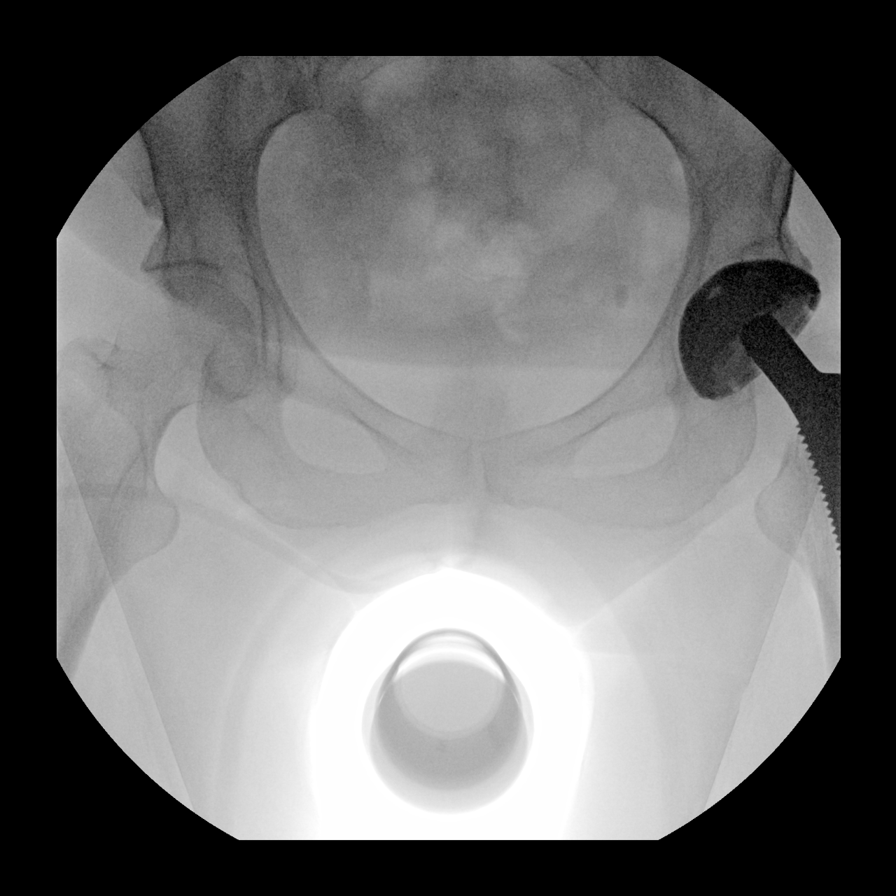
[im 4/7]
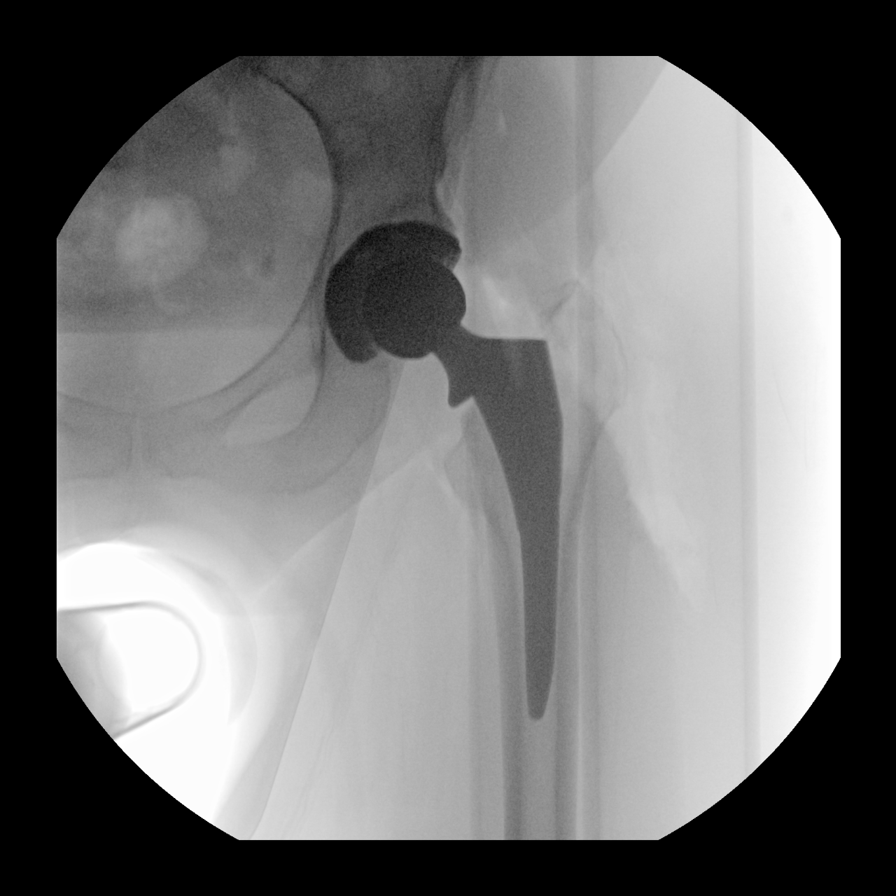
[im 5/7]
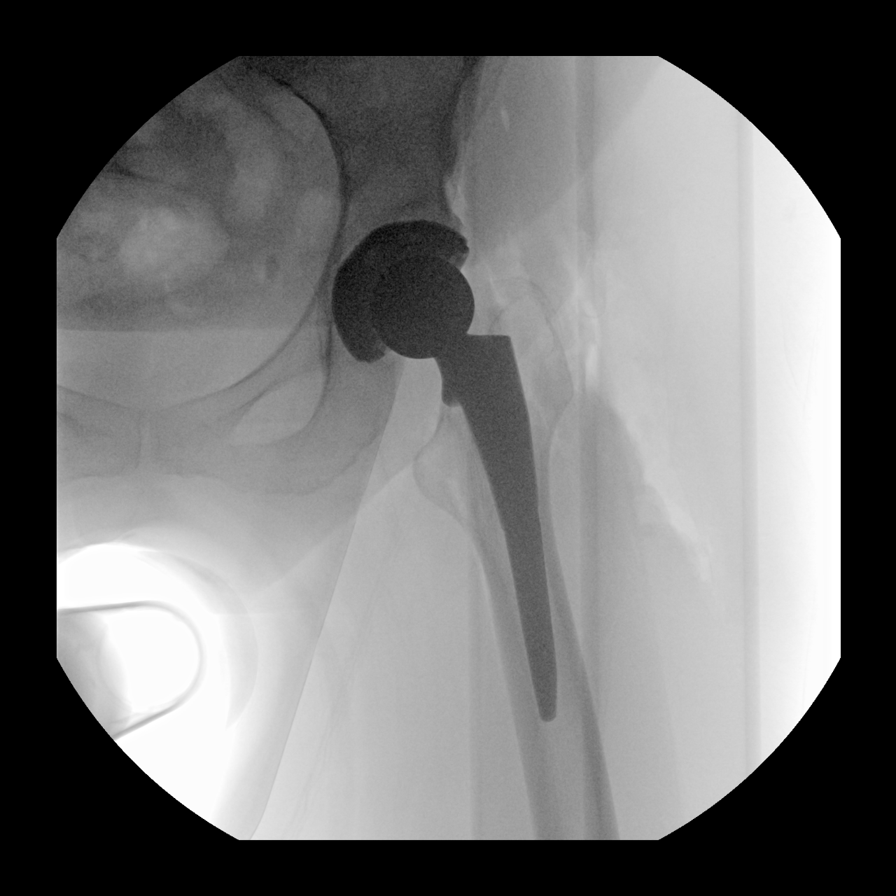
[im 6/7]
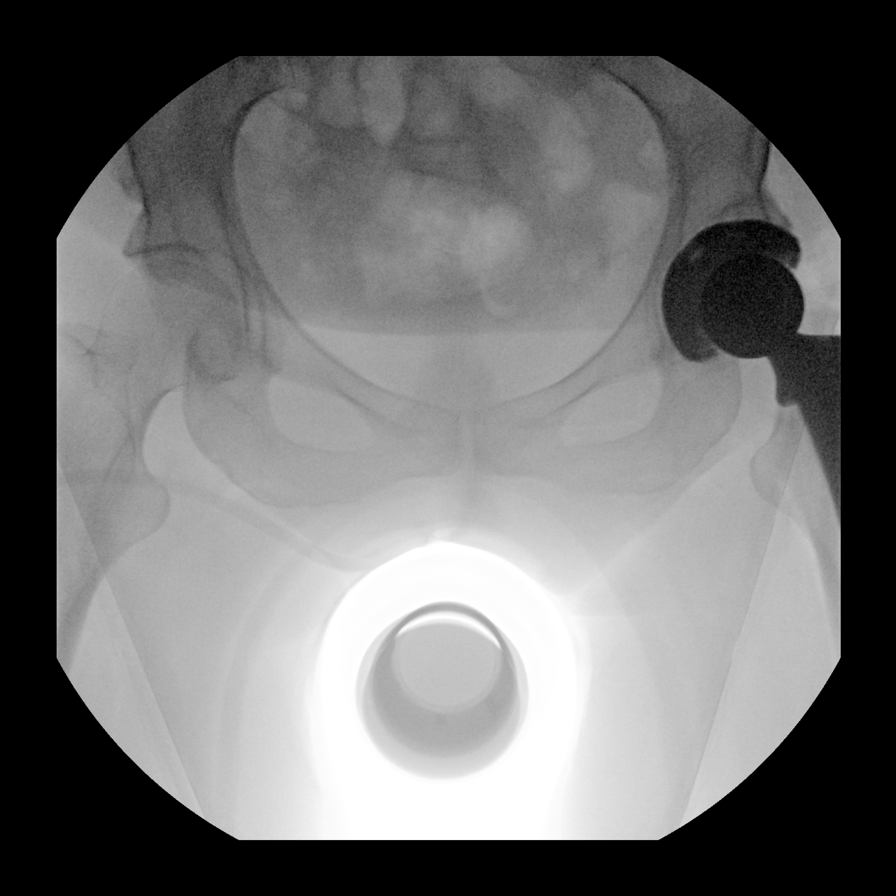
[im 7/7]
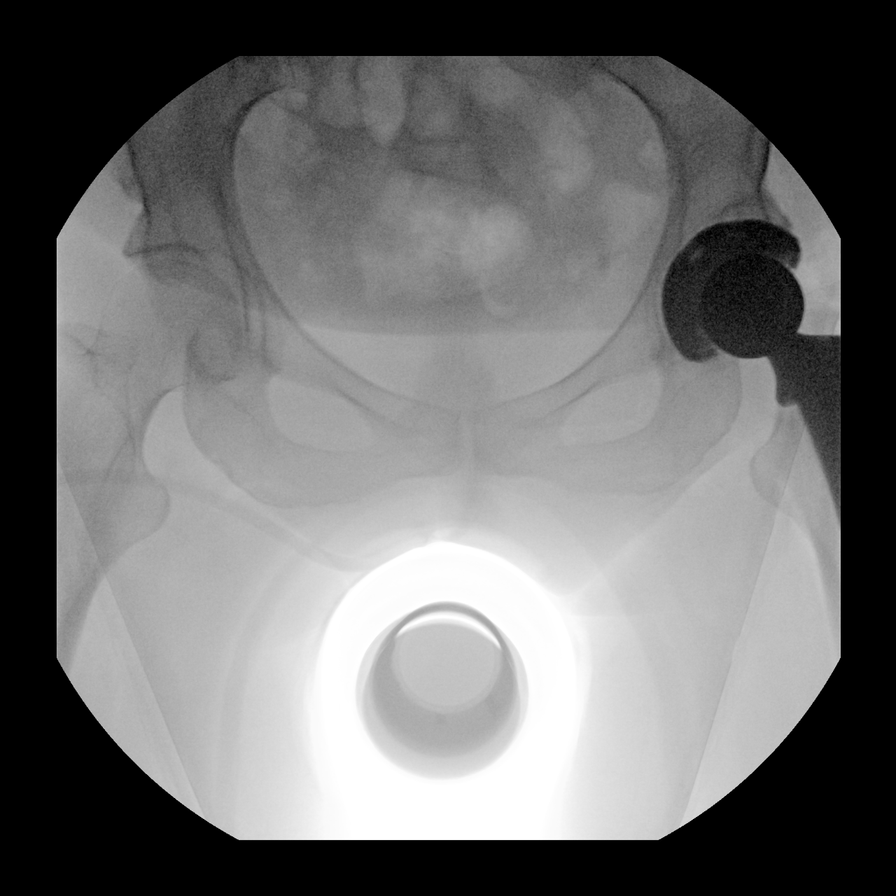

[7 of 7 positions shown; findings below may reference images not displayed]

FINDINGS: Osseous demineralization.

Baseline osteoarthritic changes LEFT hip.

Images demonstrate sequential placement of a LEFT total hip
prosthesis.

No fracture or dislocation identified.
IMPRESSION: LEFT hip arthroplasty without acute complication.

## 2020-10-17 IMAGING — DX DG PORTABLE PELVIS
1 series · 1 of 1 positions shown · non-contrast
Comparison: Portable exam [WA] hours compared to earlier
intraoperative images of same date

CLINICAL DATA: Post lung total hip arthroplasty

EXAM:
PORTABLE PELVIS 1-2 VIEWS

[pelvis ap]
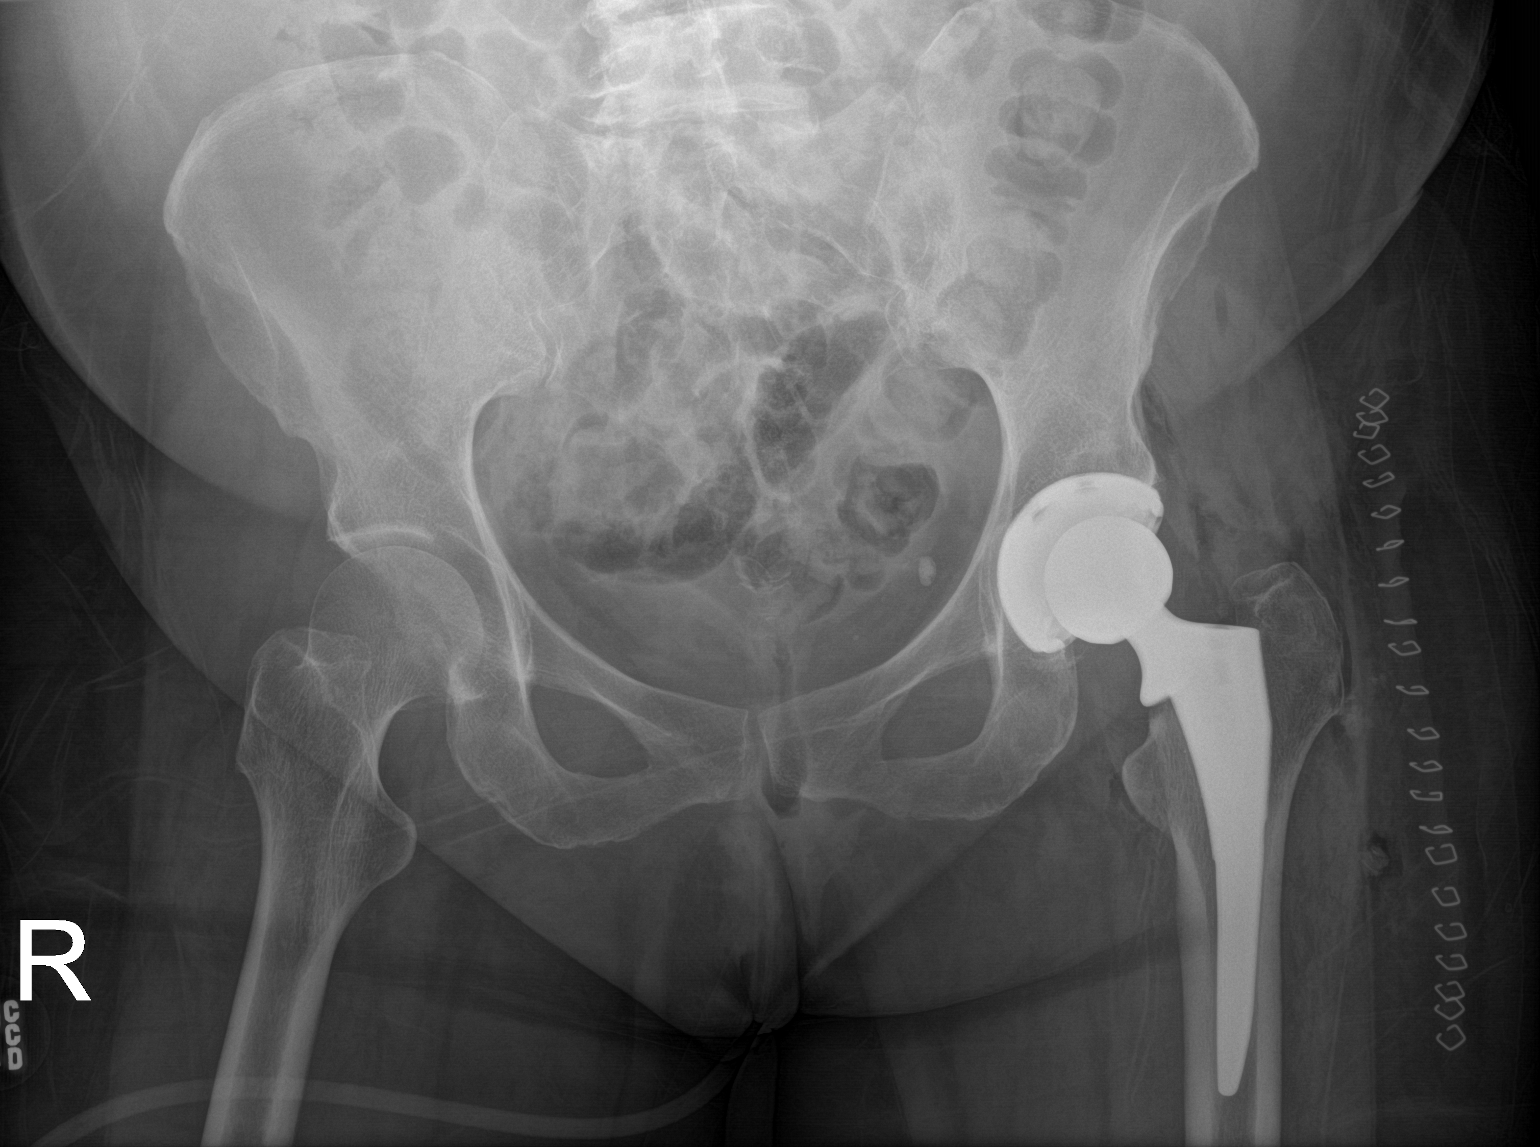

[1 of 1 positions shown; findings below may reference images not displayed]

FINDINGS: LEFT hip prosthesis identified.

No fracture or dislocation.

Bones appear demineralized.

SI joints and RIGHT hip joint space preserved.
IMPRESSION: LEFT hip arthroplasty without acute complication.

## 2020-10-17 SURGERY — ARTHROPLASTY, HIP, TOTAL, ANTERIOR APPROACH
Anesthesia: Spinal | Site: Hip | Laterality: Left

## 2020-10-17 MED ORDER — ONDANSETRON HCL 4 MG/2ML IJ SOLN
INTRAMUSCULAR | Status: DC | PRN
  Administered 2020-10-17: 4 mg via INTRAVENOUS

## 2020-10-17 MED ORDER — OXYCODONE HCL 5 MG PO TABS
5.0000 mg | ORAL_TABLET | ORAL | Status: DC | PRN
  Administered 2020-10-18 – 2020-10-19 (×6): 10 mg via ORAL
  Filled 2020-10-17 (×8): qty 2

## 2020-10-17 MED ORDER — 0.9 % SODIUM CHLORIDE (POUR BTL) OPTIME
TOPICAL | Status: DC | PRN
  Administered 2020-10-17: 1000 mL

## 2020-10-17 MED ORDER — PROPOFOL 500 MG/50ML IV EMUL
INTRAVENOUS | Status: DC | PRN
  Administered 2020-10-17: 75 ug/kg/min via INTRAVENOUS

## 2020-10-17 MED ORDER — METOCLOPRAMIDE HCL 5 MG/ML IJ SOLN: 5.0000 mg | Freq: Three times a day (TID) | INTRAMUSCULAR | Status: DC | PRN

## 2020-10-17 MED ORDER — LACTATED RINGERS IV SOLN: INTRAVENOUS | Status: DC

## 2020-10-17 MED ORDER — ONDANSETRON HCL 4 MG/2ML IJ SOLN
INTRAMUSCULAR | Status: AC
  Filled 2020-10-17: qty 2

## 2020-10-17 MED ORDER — ACETAMINOPHEN 325 MG PO TABS: 325.0000 mg | ORAL_TABLET | Freq: Four times a day (QID) | ORAL | Status: DC | PRN

## 2020-10-17 MED ORDER — SODIUM CHLORIDE 0.9 % IR SOLN
Status: DC | PRN
  Administered 2020-10-17: 1000 mL

## 2020-10-17 MED ORDER — PROMETHAZINE HCL 25 MG/ML IJ SOLN
INTRAMUSCULAR | Status: AC
  Filled 2020-10-17: qty 1

## 2020-10-17 MED ORDER — METHOCARBAMOL 500 MG PO TABS
500.0000 mg | ORAL_TABLET | Freq: Four times a day (QID) | ORAL | Status: DC | PRN
  Administered 2020-10-17 – 2020-10-19 (×7): 500 mg via ORAL
  Filled 2020-10-17 (×7): qty 1

## 2020-10-17 MED ORDER — LISINOPRIL 5 MG PO TABS
2.5000 mg | ORAL_TABLET | Freq: Every day | ORAL | Status: DC
  Administered 2020-10-17 – 2020-10-18 (×2): 2.5 mg via ORAL
  Filled 2020-10-17 (×2): qty 1

## 2020-10-17 MED ORDER — CYCLOSPORINE 0.05 % OP EMUL
1.0000 [drp] | Freq: Two times a day (BID) | OPHTHALMIC | Status: DC
  Administered 2020-10-17 – 2020-10-19 (×4): 1 [drp] via OPHTHALMIC
  Filled 2020-10-17 (×4): qty 30

## 2020-10-17 MED ORDER — ASPIRIN 81 MG PO CHEW
81.0000 mg | CHEWABLE_TABLET | Freq: Two times a day (BID) | ORAL | Status: DC
  Administered 2020-10-17 – 2020-10-19 (×4): 81 mg via ORAL
  Filled 2020-10-17 (×4): qty 1

## 2020-10-17 MED ORDER — MEPERIDINE HCL 50 MG/ML IJ SOLN: 6.2500 mg | INTRAMUSCULAR | Status: DC | PRN

## 2020-10-17 MED ORDER — PANTOPRAZOLE SODIUM 40 MG PO TBEC
40.0000 mg | DELAYED_RELEASE_TABLET | Freq: Every day | ORAL | Status: DC
  Administered 2020-10-17 – 2020-10-19 (×3): 40 mg via ORAL
  Filled 2020-10-17 (×3): qty 1

## 2020-10-17 MED ORDER — POVIDONE-IODINE 10 % EX SWAB
2.0000 "application " | Freq: Once | CUTANEOUS | Status: AC
  Administered 2020-10-17: 2 via TOPICAL

## 2020-10-17 MED ORDER — FLUOXETINE HCL 20 MG PO CAPS
40.0000 mg | ORAL_CAPSULE | Freq: Every day | ORAL | Status: DC
  Administered 2020-10-18 – 2020-10-19 (×2): 40 mg via ORAL
  Filled 2020-10-17 (×3): qty 2

## 2020-10-17 MED ORDER — ALUM & MAG HYDROXIDE-SIMETH 200-200-20 MG/5ML PO SUSP: 30.0000 mL | ORAL | Status: DC | PRN

## 2020-10-17 MED ORDER — ACETAMINOPHEN 500 MG PO TABS
1000.0000 mg | ORAL_TABLET | Freq: Once | ORAL | Status: AC
  Administered 2020-10-17: 1000 mg via ORAL
  Filled 2020-10-17: qty 2

## 2020-10-17 MED ORDER — PNEUMOCOCCAL VAC POLYVALENT 25 MCG/0.5ML IJ INJ
0.5000 mL | INJECTION | INTRAMUSCULAR | Status: DC
  Filled 2020-10-17: qty 0.5

## 2020-10-17 MED ORDER — METHOCARBAMOL 500 MG IVPB - SIMPLE MED
500.0000 mg | Freq: Four times a day (QID) | INTRAVENOUS | Status: DC | PRN
  Administered 2020-10-17: 500 mg via INTRAVENOUS
  Filled 2020-10-17: qty 50

## 2020-10-17 MED ORDER — DEXAMETHASONE SODIUM PHOSPHATE 10 MG/ML IJ SOLN
INTRAMUSCULAR | Status: DC | PRN
  Administered 2020-10-17: 8 mg via INTRAVENOUS

## 2020-10-17 MED ORDER — OXYMETAZOLINE HCL 0.05 % NA SOLN
NASAL | Status: AC
  Filled 2020-10-17: qty 30

## 2020-10-17 MED ORDER — PROPOFOL 10 MG/ML IV BOLUS
INTRAVENOUS | Status: DC | PRN
  Administered 2020-10-17: 30 mg via INTRAVENOUS
  Administered 2020-10-17: 20 mg via INTRAVENOUS
  Administered 2020-10-17 (×3): 10 mg via INTRAVENOUS

## 2020-10-17 MED ORDER — FENTANYL CITRATE (PF) 100 MCG/2ML IJ SOLN
INTRAMUSCULAR | Status: DC | PRN
  Administered 2020-10-17: 50 ug via INTRAVENOUS

## 2020-10-17 MED ORDER — CEFAZOLIN SODIUM-DEXTROSE 2-4 GM/100ML-% IV SOLN
2.0000 g | INTRAVENOUS | Status: AC
  Administered 2020-10-17: 2 g via INTRAVENOUS
  Filled 2020-10-17: qty 100

## 2020-10-17 MED ORDER — MENTHOL 3 MG MT LOZG: 1.0000 | LOZENGE | OROMUCOSAL | Status: DC | PRN

## 2020-10-17 MED ORDER — AMPHETAMINE-DEXTROAMPHETAMINE 5 MG PO TABS
5.0000 mg | ORAL_TABLET | Freq: Every day | ORAL | Status: DC
  Administered 2020-10-18 – 2020-10-19 (×2): 5 mg via ORAL
  Filled 2020-10-17 (×4): qty 1

## 2020-10-17 MED ORDER — ORAL CARE MOUTH RINSE: 15.0000 mL | Freq: Once | OROMUCOSAL | Status: AC

## 2020-10-17 MED ORDER — DOCUSATE SODIUM 100 MG PO CAPS
100.0000 mg | ORAL_CAPSULE | Freq: Two times a day (BID) | ORAL | Status: DC
  Administered 2020-10-17 – 2020-10-19 (×4): 100 mg via ORAL
  Filled 2020-10-17 (×4): qty 1

## 2020-10-17 MED ORDER — METOCLOPRAMIDE HCL 5 MG PO TABS: 5.0000 mg | ORAL_TABLET | Freq: Three times a day (TID) | ORAL | Status: DC | PRN

## 2020-10-17 MED ORDER — SODIUM CHLORIDE 0.9 % IV SOLN: INTRAVENOUS | Status: DC

## 2020-10-17 MED ORDER — PROPOFOL 10 MG/ML IV BOLUS
INTRAVENOUS | Status: AC
  Filled 2020-10-17: qty 20

## 2020-10-17 MED ORDER — ONDANSETRON HCL 4 MG PO TABS
4.0000 mg | ORAL_TABLET | Freq: Four times a day (QID) | ORAL | Status: DC | PRN
  Administered 2020-10-19 (×2): 4 mg via ORAL
  Filled 2020-10-17 (×2): qty 1

## 2020-10-17 MED ORDER — ATORVASTATIN CALCIUM 40 MG PO TABS
40.0000 mg | ORAL_TABLET | Freq: Every day | ORAL | Status: DC
  Administered 2020-10-17 – 2020-10-19 (×3): 40 mg via ORAL
  Filled 2020-10-17 (×2): qty 1

## 2020-10-17 MED ORDER — OXYMETAZOLINE HCL 0.05 % NA SOLN
NASAL | Status: DC | PRN
  Administered 2020-10-17: 1 via NASAL

## 2020-10-17 MED ORDER — ONDANSETRON HCL 4 MG/2ML IJ SOLN: 4.0000 mg | Freq: Four times a day (QID) | INTRAMUSCULAR | Status: DC | PRN

## 2020-10-17 MED ORDER — POLYVINYL ALCOHOL 1.4 % OP SOLN
1.0000 [drp] | Freq: Three times a day (TID) | OPHTHALMIC | Status: DC | PRN
  Filled 2020-10-17: qty 15

## 2020-10-17 MED ORDER — PROMETHAZINE HCL 25 MG/ML IJ SOLN
6.2500 mg | INTRAMUSCULAR | Status: DC | PRN
  Administered 2020-10-17: 6.25 mg via INTRAVENOUS

## 2020-10-17 MED ORDER — MIDAZOLAM HCL 2 MG/2ML IJ SOLN
INTRAMUSCULAR | Status: AC
  Filled 2020-10-17: qty 2

## 2020-10-17 MED ORDER — OXYCODONE HCL 5 MG PO TABS
10.0000 mg | ORAL_TABLET | ORAL | Status: DC | PRN
  Administered 2020-10-17 – 2020-10-19 (×4): 10 mg via ORAL
  Filled 2020-10-17 (×2): qty 2

## 2020-10-17 MED ORDER — POTASSIUM BICARBONATE 99 MG PO CAPS: 99.0000 mg | ORAL_CAPSULE | Freq: Three times a day (TID) | ORAL | Status: DC

## 2020-10-17 MED ORDER — FLUTICASONE PROPIONATE 50 MCG/ACT NA SUSP
2.0000 | Freq: Every day | NASAL | Status: DC
  Administered 2020-10-18 – 2020-10-19 (×2): 2 via NASAL
  Filled 2020-10-17: qty 16

## 2020-10-17 MED ORDER — ZIPRASIDONE HCL 40 MG PO CAPS
40.0000 mg | ORAL_CAPSULE | Freq: Two times a day (BID) | ORAL | Status: DC
  Administered 2020-10-17 – 2020-10-19 (×4): 40 mg via ORAL
  Filled 2020-10-17 (×5): qty 1

## 2020-10-17 MED ORDER — HYDROMORPHONE HCL 1 MG/ML IJ SOLN
INTRAMUSCULAR | Status: AC
  Filled 2020-10-17: qty 1

## 2020-10-17 MED ORDER — METHOCARBAMOL 500 MG IVPB - SIMPLE MED
INTRAVENOUS | Status: AC
  Filled 2020-10-17: qty 50

## 2020-10-17 MED ORDER — DIPHENHYDRAMINE HCL 12.5 MG/5ML PO ELIX: 12.5000 mg | ORAL_SOLUTION | ORAL | Status: DC | PRN

## 2020-10-17 MED ORDER — MIDAZOLAM HCL 5 MG/5ML IJ SOLN
INTRAMUSCULAR | Status: DC | PRN
  Administered 2020-10-17: 2 mg via INTRAVENOUS

## 2020-10-17 MED ORDER — VITAMIN D3 25 MCG (1000 UNIT) PO TABS
10000.0000 [IU] | ORAL_TABLET | Freq: Every day | ORAL | Status: DC
  Administered 2020-10-18 – 2020-10-19 (×2): 10000 [IU] via ORAL
  Filled 2020-10-17 (×3): qty 10

## 2020-10-17 MED ORDER — HYDROMORPHONE HCL 1 MG/ML IJ SOLN
0.2500 mg | INTRAMUSCULAR | Status: DC | PRN
  Administered 2020-10-17 (×4): 0.5 mg via INTRAVENOUS

## 2020-10-17 MED ORDER — ADULT MULTIVITAMIN W/MINERALS CH
1.0000 | ORAL_TABLET | Freq: Every day | ORAL | Status: DC
  Administered 2020-10-18 – 2020-10-19 (×2): 1 via ORAL
  Filled 2020-10-17 (×2): qty 1

## 2020-10-17 MED ORDER — LEVOTHYROXINE SODIUM 88 MCG PO TABS
88.0000 ug | ORAL_TABLET | ORAL | Status: DC
  Administered 2020-10-18 – 2020-10-19 (×2): 88 ug via ORAL
  Filled 2020-10-17 (×2): qty 1

## 2020-10-17 MED ORDER — PHENOL 1.4 % MT LIQD: 1.0000 | OROMUCOSAL | Status: DC | PRN

## 2020-10-17 MED ORDER — BUPIVACAINE IN DEXTROSE 0.75-8.25 % IT SOLN
INTRATHECAL | Status: DC | PRN
  Administered 2020-10-17: 1.6 mL via INTRATHECAL

## 2020-10-17 MED ORDER — TRANEXAMIC ACID-NACL 1000-0.7 MG/100ML-% IV SOLN
1000.0000 mg | INTRAVENOUS | Status: AC
  Administered 2020-10-17: 1000 mg via INTRAVENOUS
  Filled 2020-10-17: qty 100

## 2020-10-17 MED ORDER — HYDROMORPHONE HCL 1 MG/ML IJ SOLN
0.5000 mg | INTRAMUSCULAR | Status: DC | PRN
  Administered 2020-10-17 – 2020-10-19 (×8): 1 mg via INTRAVENOUS
  Filled 2020-10-17 (×8): qty 1

## 2020-10-17 MED ORDER — CEFAZOLIN SODIUM-DEXTROSE 1-4 GM/50ML-% IV SOLN
1.0000 g | Freq: Four times a day (QID) | INTRAVENOUS | Status: AC
  Administered 2020-10-17 (×2): 1 g via INTRAVENOUS
  Filled 2020-10-17: qty 50

## 2020-10-17 MED ORDER — FENTANYL CITRATE (PF) 100 MCG/2ML IJ SOLN
INTRAMUSCULAR | Status: AC
  Filled 2020-10-17: qty 2

## 2020-10-17 MED ORDER — CHLORHEXIDINE GLUCONATE 0.12 % MT SOLN
15.0000 mL | Freq: Once | OROMUCOSAL | Status: AC
  Administered 2020-10-17: 15 mL via OROMUCOSAL

## 2020-10-17 SURGICAL SUPPLY — 39 items
BAG COUNTER SPONGE SURGICOUNT (BAG) ×2 IMPLANT
BAG ZIPLOCK 12X15 (MISCELLANEOUS) IMPLANT
BENZOIN TINCTURE PRP APPL 2/3 (GAUZE/BANDAGES/DRESSINGS) IMPLANT
BLADE SAW SGTL 18X1.27X75 (BLADE) ×2 IMPLANT
COVER PERINEAL POST (MISCELLANEOUS) ×2 IMPLANT
COVER SURGICAL LIGHT HANDLE (MISCELLANEOUS) ×2 IMPLANT
CUP SECTOR GRIPTON 50MM (Cup) ×2 IMPLANT
DRAPE FOOT SWITCH (DRAPES) ×2 IMPLANT
DRAPE STERI IOBAN 125X83 (DRAPES) ×2 IMPLANT
DRAPE U-SHAPE 47X51 STRL (DRAPES) ×4 IMPLANT
DRSG AQUACEL AG ADV 3.5X10 (GAUZE/BANDAGES/DRESSINGS) ×2 IMPLANT
DURAPREP 26ML APPLICATOR (WOUND CARE) ×2 IMPLANT
ELECT REM PT RETURN 15FT ADLT (MISCELLANEOUS) ×2 IMPLANT
GAUZE XEROFORM 1X8 LF (GAUZE/BANDAGES/DRESSINGS) ×2 IMPLANT
GLOVE SRG 8 PF TXTR STRL LF DI (GLOVE) ×2 IMPLANT
GLOVE SURG ENC MOIS LTX SZ7.5 (GLOVE) ×2 IMPLANT
GLOVE SURG NEOPR MICRO LF SZ8 (GLOVE) ×2 IMPLANT
GLOVE SURG UNDER POLY LF SZ8 (GLOVE) ×2
GOWN STRL REUS W/TWL XL LVL3 (GOWN DISPOSABLE) ×4 IMPLANT
HANDPIECE INTERPULSE COAX TIP (DISPOSABLE) ×1
HEAD FEMORAL 32 CERAMIC (Hips) ×2 IMPLANT
HOLDER FOLEY CATH W/STRAP (MISCELLANEOUS) ×2 IMPLANT
KIT TURNOVER KIT A (KITS) ×2 IMPLANT
LINER ACET PNNCL PLUS4 NEUTRAL (Hips) ×1 IMPLANT
PACK ANTERIOR HIP CUSTOM (KITS) ×2 IMPLANT
PENCIL SMOKE EVACUATOR (MISCELLANEOUS) IMPLANT
PINNACLE PLUS 4 NEUTRAL (Hips) ×2 IMPLANT
SET HNDPC FAN SPRY TIP SCT (DISPOSABLE) ×1 IMPLANT
STAPLER VISISTAT 35W (STAPLE) IMPLANT
STEM FEM ACTIS HIGH SZ2 (Stem) ×2 IMPLANT
STRIP CLOSURE SKIN 1/2X4 (GAUZE/BANDAGES/DRESSINGS) IMPLANT
SUT ETHIBOND NAB CT1 #1 30IN (SUTURE) ×2 IMPLANT
SUT ETHILON 2 0 PS N (SUTURE) IMPLANT
SUT MNCRL AB 4-0 PS2 18 (SUTURE) IMPLANT
SUT VIC AB 0 CT1 36 (SUTURE) ×2 IMPLANT
SUT VIC AB 1 CT1 36 (SUTURE) ×2 IMPLANT
SUT VIC AB 2-0 CT1 27 (SUTURE) ×2
SUT VIC AB 2-0 CT1 TAPERPNT 27 (SUTURE) ×2 IMPLANT
TRAY FOLEY MTR SLVR 16FR STAT (SET/KITS/TRAYS/PACK) IMPLANT

## 2020-10-17 NOTE — Interval H&P Note (Signed)
History and Physical Interval Note: The patient understands fully that she is here today for a left total hip replacement to treat her left hip osteoarthritis.  The risks and benefits of surgery have been explained in detail and informed consent is obtained.  There is been no acute or interval change in her medical status.  Please see recent H&P.  The left operative hip has been marked.  10/17/2020 9:03 AM  [name]  has presented today for surgery, with the diagnosis of left hip degenerative joint disease/osteoarthritis.  The various methods of treatment have been discussed with the patient and family. After consideration of risks, benefits and other options for treatment, the patient has consented to  Procedure(s): LEFT TOTAL HIP ARTHROPLASTY ANTERIOR APPROACH (Left) as a surgical intervention.  The patient's history has been reviewed, patient examined, no change in status, stable for surgery.  I have reviewed the patient's chart and labs.  Questions were answered to the patient's satisfaction.     Kathryne Hitch

## 2020-10-17 NOTE — Plan of Care (Signed)
  Problem: Education: Goal: Knowledge of General Education information will improve Description: Including pain rating scale, medication(s)/side effects and non-pharmacologic comfort measures Outcome: Progressing   Problem: Activity: Goal: Risk for activity intolerance will decrease Outcome: Progressing   Problem: Pain Managment: Goal: General experience of comfort will improve Outcome: Progressing   

## 2020-10-17 NOTE — Evaluation (Signed)
Physical Therapy Evaluation Patient Details Name: Jodi Gilmore MRN: [id number] DOB: 1955-07-09 Today's Date: 10/17/2020  History of Present Illness  Pt s/p L THr and with hx of Bells Palsy, Diplopia, MOrphea Scleroderma, Psoriatic arthritis, Sjogren's Syndrome, Bipolar, ADHD, and anxiety  Clinical Impression  Pt s/p L THR and presents with decreased L LE strength/ROM and post op pain limiting functional mobility.  Pt should progress to dc home with family assist and HHPT follow up.      Recommendations for follow up therapy are one component of a multi-disciplinary discharge planning process, led by the attending physician.  Recommendations may be updated based on patient status, additional functional criteria and insurance authorization.  Follow Up Recommendations Home health PT;Follow surgeon's recommendation for DC plan and follow-up therapies    Equipment Recommendations  Rolling walker with 5" wheels;3in1 (PT)    Recommendations for Other Services       Precautions / Restrictions Precautions Precautions: Fall Restrictions Weight Bearing Restrictions: No Other Position/Activity Restrictions: WBAT      Mobility  Bed Mobility Overal bed mobility: Needs Assistance Bed Mobility: Supine to Sit     Supine to sit: Min assist     General bed mobility comments: Increased time with cues for sequence and use of R LE to self assist    Transfers Overall transfer level: Needs assistance   Transfers: Sit to/from Stand Sit to Stand: Min assist         General transfer comment: cues for LE management and use of UEs to self assist  Ambulation/Gait Ambulation/Gait assistance: Min assist Gait Distance (Feet): 75 Feet Assistive device: Rolling walker (2 wheeled) Gait Pattern/deviations: Step-to pattern;Step-through pattern;Decreased step length - right;Decreased step length - left;Scissoring;Shuffle;Trunk flexed     General Gait Details: cues for posture, position from RW  and initial sequence  Stairs            Wheelchair Mobility    Modified Rankin (Stroke Patients Only)       Balance Overall balance assessment: Needs assistance Sitting-balance support: Feet supported;No upper extremity supported Sitting balance-Leahy Scale: Good     Standing balance support: Bilateral upper extremity supported Standing balance-Leahy Scale: Poor                               Pertinent Vitals/Pain Pain Assessment: 0-10 Pain Score: 8  Pain Location: L hip and thigh Pain Descriptors / Indicators: Aching;Sore Pain Intervention(s): Limited activity within patient's tolerance;Monitored during session;Premedicated before session;Ice applied    Home Living Family/patient expects to be discharged to:: Private residence Living Arrangements: Children Available Help at Discharge: Family;Available 24 hours/day (Available for "a few days") Type of Home: Apartment Home Access: Level entry     Home Layout: One level Home Equipment: None      Prior Function Level of Independence: Independent         Comments: Pt reports spends a lot of time in bed 2* back and hip pain     Hand Dominance        Extremity/Trunk Assessment   Upper Extremity Assessment Upper Extremity Assessment: Overall WFL for tasks assessed    Lower Extremity Assessment Lower Extremity Assessment: LLE deficits/detail       Communication   Communication: No difficulties  Cognition Arousal/Alertness: Awake/alert Behavior During Therapy: WFL for tasks assessed/performed Overall Cognitive Status: Within Functional Limits for tasks assessed  General Comments      Exercises Total Joint Exercises Ankle Circles/Pumps: AROM;Both;15 reps;Supine   Assessment/Plan    PT Assessment Patient needs continued PT services  PT Problem List Decreased strength;Decreased range of motion;Decreased activity  tolerance;Decreased balance;Decreased mobility;Decreased knowledge of use of DME;Pain       PT Treatment Interventions DME instruction;Gait training;Stair training;Functional mobility training;Therapeutic activities;Therapeutic exercise;Patient/family education    PT Goals (Current goals can be found in the Care Plan section)  Acute Rehab PT Goals Patient Stated Goal: Regain IND PT Goal Formulation: With patient Time For Goal Achievement: 10/24/20 Potential to Achieve Goals: Good    Frequency 7X/week   Barriers to discharge        Co-evaluation               AM-PAC PT "6 Clicks" Mobility  Outcome Measure Help needed turning from your back to your side while in a flat bed without using bedrails?: A Little Help needed moving from lying on your back to sitting on the side of a flat bed without using bedrails?: A Little Help needed moving to and from a bed to a chair (including a wheelchair)?: A Little Help needed standing up from a chair using your arms (e.g., wheelchair or bedside chair)?: A Little Help needed to walk in hospital room?: A Little Help needed climbing 3-5 steps with a railing? : A Lot 6 Click Score: 17    End of Session Equipment Utilized During Treatment: Gait belt Activity Tolerance: Patient tolerated treatment well Patient left: in chair;with call bell/phone within reach;with chair alarm set;with family/visitor present Nurse Communication: Mobility status;Patient requests pain meds PT Visit Diagnosis: Unsteadiness on feet (R26.81);Difficulty in walking, not elsewhere classified (R26.2);Pain Pain - Right/Left: Left Pain - part of body: Hip    Time: 7371-0626 PT Time Calculation (min) (ACUTE ONLY): 30 min   Charges:   PT Evaluation $PT Eval Low Complexity: 1 Low PT Treatments $Gait Training: 8-22 mins        Mauro Kaufmann PT Acute Rehabilitation Services Pager [phone] Office [phone]   [name] 10/17/2020, 5:01 PM

## 2020-10-17 NOTE — Anesthesia Procedure Notes (Addendum)
Spinal  Patient location during procedure: OR Start time: 10/17/2020 10:16 AM End time: 10/17/2020 10:26 AM Reason for block: surgical anesthesia Staffing Performed: anesthesiologist  Anesthesiologist: Lewie Loron, MD Resident/CRNA: Marny Lowenstein, CRNA Preanesthetic Checklist Completed: patient identified, IV checked, site marked, risks and benefits discussed, surgical consent, monitors and equipment checked, pre-op evaluation and timeout performed Spinal Block Patient position: sitting Prep: DuraPrep and site prepped and draped Patient monitoring: heart rate, continuous pulse ox and blood pressure Approach: left paramedian Location: L3-4 Injection technique: single-shot Needle Needle type: Pencan  Needle gauge: 24 G Assessment Sensory level: T8 Events: CSF return and second provider Additional Notes First attempt by CRNA; second attempt by MDA. Slow CSF flow; aspirated several time throughout administration with continued swirl noted

## 2020-10-17 NOTE — Anesthesia Postprocedure Evaluation (Signed)
Anesthesia Post Note  Patient: Jodi Gilmore  Procedure(s) Performed: LEFT TOTAL HIP ARTHROPLASTY ANTERIOR APPROACH (Left: Hip)     Patient location during evaluation: PACU Anesthesia Type: Spinal Level of consciousness: awake and alert Pain management: pain level controlled Vital Signs Assessment: post-procedure vital signs reviewed and stable Respiratory status: spontaneous breathing Cardiovascular status: stable Anesthetic complications: no   No notable events documented.  Last Vitals:  Vitals:   10/17/20 1230 10/17/20 1245  BP: 101/88 117/66  Pulse: 74 69  Resp: 15   Temp:    SpO2: 95% 97%    Last Pain:  Vitals:   10/17/20 1245  TempSrc:   PainSc: 0-No pain    LLE Motor Response: Purposeful movement (10/17/20 1245)   RLE Motor Response: Purposeful movement (10/17/20 1245)   L Sensory Level: L4-Anterior knee, lower leg (10/17/20 1245) R Sensory Level: L4-Anterior knee, lower leg (10/17/20 1245)  Lewie Loron

## 2020-10-17 NOTE — Anesthesia Preprocedure Evaluation (Addendum)
Anesthesia Evaluation  Patient identified by MRN, date of birth, ID band Patient awake    Reviewed: Allergy & Precautions, NPO status , Patient's Chart, lab work & pertinent test results  History of Anesthesia Complications (+) Family history of anesthesia reaction  Airway Mallampati: II  TM Distance: >3 FB Neck ROM: Full    Dental no notable dental hx.    Pulmonary asthma , pneumonia, former smoker,    Pulmonary exam normal breath sounds clear to auscultation       Cardiovascular hypertension, Pt. on medications Normal cardiovascular exam+ Valvular Problems/Murmurs  Rhythm:Regular Rate:Normal     Neuro/Psych  Headaches, PSYCHIATRIC DISORDERS Anxiety Depression Bipolar Disorder  Neuromuscular disease    GI/Hepatic Neg liver ROS, GERD  ,  Endo/Other  Hypothyroidism   Renal/GU negative Renal ROS     Musculoskeletal  (+) Arthritis ,   Abdominal   Peds  Hematology negative hematology ROS (+)   Anesthesia Other Findings   Reproductive/Obstetrics                           Anesthesia Physical Anesthesia Plan  ASA: 2  Anesthesia Plan: Spinal   Post-op Pain Management:    Induction: Intravenous  PONV Risk Score and Plan: 3 and Ondansetron, Treatment may vary due to age or medical condition, Propofol infusion and TIVA  Airway Management Planned: Natural Airway  Additional Equipment: None  Intra-op Plan:   Post-operative Plan:   Informed Consent: I have reviewed the patients History and Physical, chart, labs and discussed the procedure including the risks, benefits and alternatives for the proposed anesthesia with the patient or authorized representative who has indicated his/her understanding and acceptance.     Dental advisory given  Plan Discussed with: CRNA  Anesthesia Plan Comments:       Anesthesia Quick Evaluation

## 2020-10-17 NOTE — Brief Op Note (Signed)
10/17/2020  11:32 AM  PATIENT:  Jodi Gilmore  65 y.o. female  PRE-OPERATIVE DIAGNOSIS:  left hip degenerative joint disease/osteoarthritis  POST-OPERATIVE DIAGNOSIS:  left hip degenerative joint disease/osteoarthritis  PROCEDURE:  Procedure(s): LEFT TOTAL HIP ARTHROPLASTY ANTERIOR APPROACH (Left)  SURGEON:  Surgeon(s) and Role:    Kathryne Hitch, MD - Primary  PHYSICIAN ASSISTANT:  Rexene Edison, PA-C  ANESTHESIA:   spinal  EBL:  150 mL   COUNTS:  YES  DICTATION: .Other Dictation: Dictation Number [id number]  PLAN OF CARE: Admit for overnight observation  PATIENT DISPOSITION:  PACU - hemodynamically stable.   Delay start of Pharmacological VTE agent (>24hrs) due to surgical blood loss or risk of bleeding: no

## 2020-10-17 NOTE — Op Note (Signed)
NAMESHRIYA, AKER MEDICAL RECORD NO: [id number] ACCOUNT NO: [id number] DATE OF BIRTH: October 15, 1955 FACILITY: [facility] LOCATION: [id number] PHYSICIAN: Vanita Panda. Magnus Ivan, MD  Operative Report   DATE OF PROCEDURE: 10/17/2020   PREOPERATIVE DIAGNOSIS:  Primary osteoarthritis and degenerative joint disease, left hip.  POSTOPERATIVE DIAGNOSIS:  Primary osteoarthritis and degenerative joint disease, left hip.  PROCEDURE:  Left total hip arthroplasty through direct anterior approach.  IMPLANTS:  DePuy sector Gription acetabular component size 50, size 32+4 neutral polyethylene liner, size 2 ACTIS femoral component with high offset, size 32+1 ceramic hip ball.  SURGEON:  Doneen Poisson, M.D.  ASSISTANT:  Richardean Canal, PA-C.  ANESTHESIA:  Spinal.  ANTIBIOTICS:  2 g IV Ancef.  ESTIMATED BLOOD LOSS:  150 mL.  COMPLICATIONS:  None.  INDICATIONS:  The patient is a 65 year old female well known to [place].  She has debilitating end-stage arthritis involving her left hip that has been documented radiographically and mechanically through a clinical exam.  At this point, her hip pain is daily  and it is detrimentally affecting her activities of daily living, her quality of life and her mobility to the point she does wish to proceed with a left total hip arthroplasty.  We agree with this as well.  We talked in length in detail about the risk  of acute blood loss anemia, nerve or vessel injury, fracture, infection, DVT, implant failure, skin and soft tissue issues and leg length differences.  We talked about our goals being decreased pain, improve mobility and overall improved quality of life.  DESCRIPTION OF PROCEDURE:  After informed consent was obtained, appropriate left hip was marked.  She was brought to the operating room and sat up on the stretcher where spinal anesthesia was obtained.  She was laid in supine position on the stretcher.   We assessed her leg lengths.  Foley catheter was placed  and traction boots were placed on both her feet.  Next, she was placed supine on the Hana fracture table, the perineal post in place and both legs in line skeletal traction devices.  No traction  applied.  Her left operative hip was prepped and draped in DuraPrep and sterile drapes.  A timeout was called she was identified as correct patient, correct left hip.  We then made an incision just inferior and posterior to the anterior superior iliac  spine and carried this obliquely down the leg.  We dissected down tensor fascia lata muscle.  Tensor fascia was then divided longitudinally to proceed with direct anterior approach to the hip.  We identified and cauterized circumflex vessels, identified  the hip capsule, opened the hip capsule in L-type format finding a moderate joint effusion and significant periarticular osteophytes around the lateral femoral head and neck.  We then placed curved retractors in the medial and lateral femoral neck and  made a femoral neck cut with an oscillating saw just proximal to the lesser trochanter and completed this with an osteotome.  We placed a corkscrew guide in the femoral head and removed the femoral head in its entirety and found a wide area devoid of  cartilage.  I then placed a bent Hohmann over the medial acetabular rim and removed remnants of the acetabular labrum and other debris.  We then began reaming under direct visualization from a size 43 reamer in a stepwise increments going up to a size 49  reamer.  With all reamers placed under direct visualization, the last reamer was placed under direct fluoroscopy, so we could obtain our  depth of reaming, our inclination and anteversion.  I then placed a real DePuy sector Gription acetabular component,  size 50, we went with a 32+4 polyethylene liner based on medialization of the acetabular component and offset.  Attention was then turned to the femur.  With the leg externally rotated to 120 degrees and adducted, we were  able to place a Mueller  retractor medially and Hohman retractor behind the greater trochanter, we released lateral joint capsule and used a box cutting osteotome to enter the femoral canal and a rongeur to lateralize, then began broaching using the ACTIS broaching system from a  size 0 going to only a size 2. With a size 2 in place, we trialed standard offset femoral neck and a 32+1 trial hip ball, reduced this in the acetabulum and we definitely needed more offset.  We dislocated the hip, removed the trial components.  We then  placed the real ACTIS femoral component size 2 with a high offset stem and again this was based on assessing radiographically with intraoperative films.  We then went with a 32+1 ceramic hip ball and reduced this in the pelvis.  We were pleased with leg  length, offset, range of motion and stability assessed mechanically and radiographically.  We then irrigated the soft tissue with normal saline solution using pulsatile lavage.  We closed the joint capsule with interrupted #1 Ethibond suture followed by  #1 Vicryl to close the tensor fascia.  0 Vicryl was used to close the deep tissue and 2-0 Vicryl was used to close subcutaneous tissue.  The skin was closed with staples.  An Aquacel dressing was applied.  She was taken off Hana table and taken to  recovery room in stable condition with all final counts being correct.  No complications noted.   Of note, Rexene Edison, PA-C did assist during the entire case and assistance was crucial for facilitating every aspect of this case.   Xaver.Mink D: 10/17/2020 11:31:09 am T: 10/17/2020 11:38:00 pm  JOB: [id number]/ [id number]

## 2020-10-17 NOTE — Transfer of Care (Signed)
Immediate Anesthesia Transfer of Care Note  Patient: Jodi Gilmore  Procedure(s) Performed: LEFT TOTAL HIP ARTHROPLASTY ANTERIOR APPROACH (Left: Hip)  Patient Location: PACU  Anesthesia Type:MAC and Spinal  Level of Consciousness: awake, alert  and oriented  Airway & Oxygen Therapy: Patient Spontanous Breathing  Post-op Assessment: Report given to RN and Post -op Vital signs reviewed and stable  Post vital signs: Reviewed and stable  Last Vitals:  Vitals Value Taken Time  BP 101/55 10/17/20 1146  Temp 36.4 C 10/17/20 1146  Pulse 73 10/17/20 1151  Resp 11 10/17/20 1151  SpO2 96 % 10/17/20 1151  Vitals shown include unvalidated device data.  Last Pain:  Vitals:   10/17/20 0752  TempSrc: Oral  PainSc:       Patients Stated Pain Goal: 5 (76/39/43 2003)  Complications: No notable events documented.

## 2020-10-18 ENCOUNTER — Encounter: Payer: Self-pay | Admitting: Orthopaedic Surgery

## 2020-10-18 DIAGNOSIS — M1612 Unilateral primary osteoarthritis, left hip: Secondary | ICD-10-CM | POA: Diagnosis not present

## 2020-10-18 LAB — BASIC METABOLIC PANEL
Anion gap: 9 (ref 5–15)
BUN: 15 mg/dL (ref 8–23)
CO2: 28 mmol/L (ref 22–32)
Calcium: 9.5 mg/dL (ref 8.9–10.3)
Chloride: 105 mmol/L (ref 98–111)
Creatinine, Ser: 0.68 mg/dL (ref 0.44–1.00)
GFR, Estimated: >60 mL/min (ref >60)
Glucose, Bld: 104 mg/dL — ABNORMAL HIGH (ref 70–99)
Potassium: 3.6 mmol/L (ref 3.5–5.1)
Sodium: 142 mmol/L (ref 135–145)

## 2020-10-18 LAB — CBC
HCT: 32.5 % — ABNORMAL LOW (ref 36.0–46.0)
Hemoglobin: 10.7 g/dL — ABNORMAL LOW (ref 12.0–15.0)
MCH: 29.2 pg (ref 26.0–34.0)
MCHC: 32.9 g/dL (ref 30.0–36.0)
MCV: 88.6 fL (ref 80.0–100.0)
Platelets: 319 10*3/uL (ref 150–400)
RBC: 3.67 MIL/uL — ABNORMAL LOW (ref 3.87–5.11)
RDW: 13 % (ref 11.5–15.5)
WBC: 16.8 10*3/uL — ABNORMAL HIGH (ref 4.0–10.5)
nRBC: 0 % (ref 0.0–0.2)

## 2020-10-18 MED ORDER — ASPIRIN 81 MG PO CHEW: 81.0000 mg | CHEWABLE_TABLET | Freq: Two times a day (BID) | ORAL | 0 refills | Status: DC

## 2020-10-18 MED ORDER — OXYCODONE HCL 5 MG PO TABS: 5.0000 mg | ORAL_TABLET | Freq: Four times a day (QID) | ORAL | 0 refills | Status: DC | PRN

## 2020-10-18 MED ORDER — METHOCARBAMOL 500 MG PO TABS: 500.0000 mg | ORAL_TABLET | Freq: Four times a day (QID) | ORAL | 1 refills | Status: DC | PRN

## 2020-10-18 NOTE — Evaluation (Addendum)
Physical Therapy Patient Details Name: Jodi Gilmore MRN: [id number] DOB: [birth year] Today's Date: 10/18/2020  History of Present Illness  Pt s/p L THr and with hx of Bells Palsy, Diplopia, MOrphea Scleroderma, Psoriatic arthritis, Sjogren's Syndrome, Bipolar, ADHD, and anxiety  Clinical Impression  Pt continues cooperative but progressing slowly with mobility 2* elevated pain level.  Pt hopeful for dc home tomorrow.     Recommendations for follow up therapy are one component of a multi-disciplinary discharge planning process, led by the attending physician.  Recommendations may be updated based on patient status, additional functional criteria and insurance authorization.  Follow Up Recommendations Home health PT;Follow surgeon's recommendation for DC plan and follow-up therapies    Equipment Recommendations  Rolling walker with 5" wheels;3in1 (PT)    Recommendations for Other Services       Precautions / Restrictions Precautions Precautions: Fall Restrictions Weight Bearing Restrictions: No Other Position/Activity Restrictions: WBAT      Mobility  Bed Mobility Overal bed mobility: Needs Assistance Bed Mobility: Supine to Sit;Sit to Supine     Supine to sit: Min assist Sit to supine: Min assist   General bed mobility comments: Increased time with cues for sequence and use of R LE to self assist; physical assist to manage L LE    Transfers Overall transfer level: Needs assistance Equipment used: Rolling walker (2 wheeled) Transfers: Sit to/from Stand Sit to Stand: Min assist;Min guard         General transfer comment: cues for LE management and use of UEs to self assist;  Ambulation/Gait Ambulation/Gait assistance: Min assist;Min guard Gait Distance (Feet): 110 Feet Assistive device: Rolling walker (2 wheeled) Gait Pattern/deviations: Step-to pattern;Step-through pattern;Decreased step length - right;Decreased step length - left;Scissoring;Shuffle;Trunk  flexed Gait velocity: decr   General Gait Details: cues for posture, position from RW and initial sequence  Stairs            Wheelchair Mobility    Modified Rankin (Stroke Patients Only)       Balance Overall balance assessment: Needs assistance Sitting-balance support: Feet supported;No upper extremity supported Sitting balance-Leahy Scale: Good     Standing balance support: Bilateral upper extremity supported Standing balance-Leahy Scale: Poor                               Pertinent Vitals/Pain Pain Assessment: 0-10 Pain Score: 8  Pain Location: L hip and thigh Pain Descriptors / Indicators: Aching;Sore Pain Intervention(s): Limited activity within patient's tolerance;Monitored during session;Premedicated before session;Ice applied;Repositioned;RN gave pain meds during session;Patient requesting pain meds-RN notified    Home Living                        Prior Function                 Hand Dominance        Extremity/Trunk Assessment                Communication      Cognition Arousal/Alertness: Awake/alert Behavior During Therapy: WFL for tasks assessed/performed Overall Cognitive Status: Within Functional Limits for tasks assessed                                        General Comments      Exercises     Assessment/Plan    PT  Assessment    PT Problem List         PT Treatment Interventions      PT Goals (Current goals can be found in the Care Plan section)  Acute Rehab PT Goals Patient Stated Goal: Regain IND PT Goal Formulation: With patient Time For Goal Achievement: 10/24/20 Potential to Achieve Goals: Good    Frequency 7X/week   Barriers to discharge        Co-evaluation               AM-PAC PT "6 Clicks" Mobility  Outcome Measure Help needed turning from your back to your side while in a flat bed without using bedrails?: A Little Help needed moving from lying on  your back to sitting on the side of a flat bed without using bedrails?: A Little Help needed moving to and from a bed to a chair (including a wheelchair)?: A Little Help needed standing up from a chair using your arms (e.g., wheelchair or bedside chair)?: A Little Help needed to walk in hospital room?: A Little Help needed climbing 3-5 steps with a railing? : A Lot 6 Click Score: 17    End of Session Equipment Utilized During Treatment: Gait belt Activity Tolerance: Patient tolerated treatment well Patient left: in bed;with call bell/phone within reach;with bed alarm set;with nursing/sitter in room Nurse Communication: Mobility status;Patient requests pain meds PT Visit Diagnosis: Unsteadiness on feet (R26.81);Difficulty in walking, not elsewhere classified (R26.2);Pain Pain - Right/Left: Left Pain - part of body: Hip    Time: 6283-6629 PT Time Calculation (min) (ACUTE ONLY): 34 min   Charges:     PT Treatments $Gait Training: 23-37 mins        Mauro Kaufmann PT Acute Rehabilitation Services Pager [phone] Office [phone]   Jodi Gilmore 10/18/2020, 4:08 PM

## 2020-10-18 NOTE — Discharge Instructions (Signed)

## 2020-10-18 NOTE — Plan of Care (Signed)
  Problem: Education: Goal: Knowledge of General Education information will improve Description: Including pain rating scale, medication(s)/side effects and non-pharmacologic comfort measures Outcome: Progressing   Problem: Activity: Goal: Risk for activity intolerance will decrease Outcome: Progressing   Problem: Skin Integrity: Goal: Risk for impaired skin integrity will decrease Outcome: Progressing   

## 2020-10-18 NOTE — TOC Initial Note (Addendum)
Transition of Care (TOC) - Initial/Assessment Note    Patient Details  Name: Jodi Gilmore MRN: [id number] Date of Birth: 05/14/1955  Transition of Care (TOC) CM/SW Contact:    ,  Jon, LCSW Phone Number: 10/18/2020, 11:35 AM  Clinical Narrative:     CSW met with pt regarding HH/DME.  Pt reports [facility] has already contacted her and she is planning to work with them, asked that HH aide be added.  Pt has private duty aide but would like additional assistance.  Choice document given.  Pt does want walker/3n1.  Pt lives alone, son will also be assisting her at DC, permission given to speak with son.  PCP in place.  CSW confirmed with Stacie at [facility] that they have this referral.     Do not see note about DME being ordered, ordered from [employer].             Expected Discharge Plan: Home w Home Health Services Barriers to Discharge: No Barriers Identified   Patient Goals and CMS Choice   CMS Medicare.gov Compare Post Acute Care list provided to:: Patient Choice offered to / list presented to : Patient  Expected Discharge Plan and Services Expected Discharge Plan: Home w Home Health Services In-house Referral: Clinical Social Work   Post Acute Care Choice: Durable Medical Equipment, Home Health Living arrangements for the past 2 months: Single Family Home                           HH Arranged: PT, Nurse's Aide HH Agency: [facility] Date HH Agency Contacted: 10/18/20 Time HH Agency Contacted: 1115 Representative spoke with at HH Agency: Stacie  Prior Living Arrangements/Services Living arrangements for the past 2 months: Single Family Home Lives with:: Self Patient language and need for interpreter reviewed:: Yes Do you feel safe going back to the place where you live?: Yes      Need for Family Participation in Patient Care: No (Comment) Care giver support system in place?: Yes (comment) Current home services: Other (comment)  (none) Criminal Activity/Legal Involvement Pertinent to Current Situation/Hospitalization: No - Comment as needed  Activities of Daily Living Home Assistive Devices/Equipment: None ADL Screening (condition at time of admission) Patient's cognitive ability adequate to safely complete daily activities?: Yes Is the patient deaf or have difficulty hearing?: No Does the patient have difficulty seeing, even when wearing glasses/contacts?: No Does the patient have difficulty concentrating, remembering, or making decisions?: No Patient able to express need for assistance with ADLs?: Yes Does the patient have difficulty dressing or bathing?: No Independently performs ADLs?: Yes (appropriate for developmental age) Does the patient have difficulty walking or climbing stairs?: No Weakness of Legs: Left Weakness of Arms/Hands: None  Permission Sought/Granted Permission sought to share information with : Family Supports Permission granted to share information with : Yes, Verbal Permission Granted  Share Information with NAME: son Gabe           Emotional Assessment Appearance:: Appears stated age Attitude/Demeanor/Rapport: Engaged Affect (typically observed): Appropriate, Pleasant Orientation: : Oriented to Self, Oriented to Place, Oriented to  Time, Oriented to Situation Alcohol / Substance Use: Not Applicable Psych Involvement: No (comment)  Admission diagnosis:  Status post left hip replacement [Z96.642] Patient Active Problem List   Diagnosis Date Noted   Status post left hip replacement 10/17/2020   Unilateral primary osteoarthritis, left hip 10/16/2020   Chronic pain of left knee 10/08/2020   Bilateral hand pain   09/01/2020   Lower back injury 09/01/2020   Idiopathic scoliosis of lumbar spine 09/01/2020   Bipolar affective disorder, current episode mixed ([place]) 08/29/2020   Primary osteoarthritis of left hip 07/30/2020   Essential (primary) hypertension 07/17/2020   HSV (herpes  simplex virus) anogenital infection 06/26/2020   Generalized anxiety disorder 06/04/2020   Attention deficit hyperactivity disorder (ADHD) 06/04/2020   Migraine headache with aura 05/17/2020   Sinusitis chronic, frontal 05/17/2020   HLD (hyperlipidemia) 05/17/2020   Elevated C-reactive protein (CRP) 02/13/2020   Other specified arthritis, left knee 02/13/2020   Hip arthritis 02/13/2020   Bipolar disorder ([place]) 02/13/2020   Hypothyroidism (acquired) 01/03/2020   Diarrhea in adult patient 01/03/2020   Brain fog 01/03/2020   Healthcare maintenance 01/03/2020   PCP:  Eulis Foster, MD Pharmacy:   [facility] [address] [place], [place]. AT [address] [place] [address] Phone: [phone] Fax: [phone]     Social Determinants of Health (SDOH) Interventions    Readmission Risk Interventions No flowsheet data found.

## 2020-10-18 NOTE — Progress Notes (Signed)
Physical Therapy Treatment Patient Details Name: Jodi Gilmore MRN: [id number] DOB: 1955-09-05 Today's Date: 10/18/2020   History of Present Illness Pt s/p L THr and with hx of Bells Palsy, Diplopia, MOrphea Scleroderma, Psoriatic arthritis, Sjogren's Syndrome, Bipolar, ADHD, and anxiety    PT Comments    Pt continues motivated but with increased c/o pain this am.  Pt performed therex program, up to [facility] for toileting and to ambulate in hall.   Recommendations for follow up therapy are one component of a multi-disciplinary discharge planning process, led by the attending physician.  Recommendations may be updated based on patient status, additional functional criteria and insurance authorization.  Follow Up Recommendations  Home health PT;Follow surgeon's recommendation for DC plan and follow-up therapies     Equipment Recommendations  Rolling walker with 5" wheels;3in1 (PT)    Recommendations for Other Services       Precautions / Restrictions Precautions Precautions: Fall Restrictions Weight Bearing Restrictions: No Other Position/Activity Restrictions: WBAT     Mobility  Bed Mobility Overal bed mobility: Needs Assistance Bed Mobility: Supine to Sit     Supine to sit: Min assist     General bed mobility comments: Increased time with cues for sequence and use of R LE to self assist    Transfers Overall transfer level: Needs assistance   Transfers: Sit to/from Stand;Stand Pivot Transfers Sit to Stand: Min assist Stand pivot transfers: Min assist       General transfer comment: cues for LE management and use of UEs to self assist; stnad pvt with RW bed to [facility]  Ambulation/Gait Ambulation/Gait assistance: Min assist Gait Distance (Feet): 85 Feet Assistive device: Rolling walker (2 wheeled) Gait Pattern/deviations: Step-to pattern;Step-through pattern;Decreased step length - right;Decreased step length - left;Scissoring;Shuffle;Trunk flexed Gait velocity: decr    General Gait Details: cues for posture, position from RW and initial sequence   Stairs             Wheelchair Mobility    Modified Rankin (Stroke Patients Only)       Balance Overall balance assessment: Needs assistance Sitting-balance support: Feet supported;No upper extremity supported Sitting balance-Leahy Scale: Good     Standing balance support: Bilateral upper extremity supported Standing balance-Leahy Scale: Poor                              Cognition Arousal/Alertness: Awake/alert Behavior During Therapy: WFL for tasks assessed/performed Overall Cognitive Status: Within Functional Limits for tasks assessed                                        Exercises Total Joint Exercises Ankle Circles/Pumps: AROM;Both;15 reps;Supine Quad Sets: AROM;Both;10 reps;Supine Heel Slides: AAROM;Left;20 reps;Supine Hip ABduction/ADduction: AAROM;Left;15 reps;Supine    General Comments        Pertinent Vitals/Pain Pain Assessment: 0-10 Pain Score: 8  Pain Location: L hip and thigh Pain Descriptors / Indicators: Aching;Sore Pain Intervention(s): Limited activity within patient's tolerance;Monitored during session;Premedicated before session;Ice applied    Home Living                      Prior Function            PT Goals (current goals can now be found in the care plan section) Acute Rehab PT Goals Patient Stated Goal: Regain IND PT Goal Formulation: With patient  Time For Goal Achievement: 10/24/20 Potential to Achieve Goals: Good Progress towards PT goals: Progressing toward goals    Frequency    7X/week      PT Plan Current plan remains appropriate    Co-evaluation              AM-PAC PT "6 Clicks" Mobility   Outcome Measure  Help needed turning from your back to your side while in a flat bed without using bedrails?: A Little Help needed moving from lying on your back to sitting on the side of a flat  bed without using bedrails?: A Little Help needed moving to and from a bed to a chair (including a wheelchair)?: A Little Help needed standing up from a chair using your arms (e.g., wheelchair or bedside chair)?: A Little Help needed to walk in hospital room?: A Little Help needed climbing 3-5 steps with a railing? : A Lot 6 Click Score: 17    End of Session Equipment Utilized During Treatment: Gait belt Activity Tolerance: Patient tolerated treatment well Patient left: in chair;with call bell/phone within reach;with chair alarm set;with family/visitor present Nurse Communication: Mobility status;Patient requests pain meds PT Visit Diagnosis: Unsteadiness on feet (R26.81);Difficulty in walking, not elsewhere classified (R26.2);Pain Pain - Right/Left: Left Pain - part of body: Hip     Time: 1660-6301 PT Time Calculation (min) (ACUTE ONLY): 38 min  Charges:  $Gait Training: 8-22 mins $Therapeutic Exercise: 8-22 mins $Therapeutic Activity: 8-22 mins                     Mauro Kaufmann PT Acute Rehabilitation Services Pager [phone] Office [phone]    Jodi Gilmore 10/18/2020, 12:32 PM

## 2020-10-18 NOTE — Progress Notes (Signed)
Subjective: 1 Day Post-Op Procedure(s) (LRB): LEFT TOTAL HIP ARTHROPLASTY ANTERIOR APPROACH (Left) Patient reports pain as moderate.    Objective: Vital signs in last 24 hours: Temp:  [97.6 F (36.4 C)-98.9 F (37.2 C)] 98.7 F (37.1 C) (10/08 0509) Pulse Rate:  [68-85] 75 (10/08 1039) Resp:  [12-19] 18 (10/08 1039) BP: (98-134)/(54-100) 126/64 (10/08 1039) SpO2:  [94 %-97 %] 97 % (10/08 1039)  Intake/Output from previous day: 10/07 0701 - 10/08 0700 In: 2199.9 [I.V.:1999.9; IV Piggyback:200] Out: 1350 [Urine:1200; Blood:150] Intake/Output this shift: Total I/O In: 300 [I.V.:300] Out: -   Recent Labs    10/18/20 0312  HGB 10.7*   Recent Labs    10/18/20 0312  WBC 16.8*  RBC 3.67*  HCT 32.5*  PLT 319   Recent Labs    10/18/20 0312  NA 142  K 3.6  CL 105  CO2 28  BUN 15  CREATININE 0.68  GLUCOSE 104*  CALCIUM 9.5   No results for input(s): LABPT, INR in the last 72 hours.  Sensation intact distally Intact pulses distally Dorsiflexion/Plantar flexion intact Incision: scant drainage   Assessment/Plan: 1 Day Post-Op Procedure(s) (LRB): LEFT TOTAL HIP ARTHROPLASTY ANTERIOR APPROACH (Left) Up with therapy Plan for discharge tomorrow Discharge home with home health      Kathryne Hitch 10/18/2020, 10:51 AM

## 2020-10-19 ENCOUNTER — Other Ambulatory Visit: Payer: Self-pay | Admitting: Orthopaedic Surgery

## 2020-10-19 DIAGNOSIS — M1612 Unilateral primary osteoarthritis, left hip: Secondary | ICD-10-CM | POA: Diagnosis not present

## 2020-10-19 MED ORDER — ONDANSETRON HCL 4 MG PO TABS: 4.0000 mg | ORAL_TABLET | Freq: Four times a day (QID) | ORAL | 0 refills | Status: DC | PRN

## 2020-10-19 MED ORDER — ASPIRIN-ACETAMINOPHEN-CAFFEINE 250-250-65 MG PO TABS
2.0000 | ORAL_TABLET | Freq: Once | ORAL | Status: AC
  Administered 2020-10-19: 2 via ORAL
  Filled 2020-10-19: qty 2

## 2020-10-19 NOTE — Plan of Care (Signed)
  Problem: Education: Goal: Knowledge of General Education information will improve Description: Including pain rating scale, medication(s)/side effects and non-pharmacologic comfort measures Outcome: Progressing   Problem: Pain Managment: Goal: General experience of comfort will improve Outcome: Progressing   

## 2020-10-19 NOTE — Progress Notes (Signed)
Physical Therapy Treatment Patient Details Name: Jodi Gilmore MRN: [id number] DOB: Mar 25, 1955 Today's Date: 10/19/2020   History of Present Illness Pt s/p L THr and with hx of Bells Palsy, Diplopia, MOrphea Scleroderma, Psoriatic arthritis, Sjogren's Syndrome, Bipolar, ADHD, and anxiety    PT Comments    Pt continues very cooperative and reports improved pain control.  Pt up to ambulate increased distance in hall and able to transfer in and out of bed with increased time but no physical assist.  Pt should progress to dc home this date.   Recommendations for follow up therapy are one component of a multi-disciplinary discharge planning process, led by the attending physician.  Recommendations may be updated based on patient status, additional functional criteria and insurance authorization.  Follow Up Recommendations  Home health PT;Follow surgeon's recommendation for DC plan and follow-up therapies     Equipment Recommendations  Rolling walker with 5" wheels;3in1 (PT)    Recommendations for Other Services       Precautions / Restrictions Precautions Precautions: Fall Restrictions Weight Bearing Restrictions: No Other Position/Activity Restrictions: WBAT     Mobility  Bed Mobility Overal bed mobility: Needs Assistance Bed Mobility: Supine to Sit;Sit to Supine     Supine to sit: Min guard Sit to supine: Min guard   General bed mobility comments: Increased time with cues for sequence and use of R LE to self assist; Use of gait belt OOB bed and R LE assisting L back into bed    Transfers Overall transfer level: Needs assistance Equipment used: Rolling walker (2 wheeled) Transfers: Sit to/from Stand Sit to Stand: Min guard;Supervision         General transfer comment: cues for LE management and use of UEs to self assist;  Ambulation/Gait Ambulation/Gait assistance: Min guard;Supervision Gait Distance (Feet): 200 Feet Assistive device: Rolling walker (2  wheeled) Gait Pattern/deviations: Step-to pattern;Step-through pattern;Decreased step length - right;Decreased step length - left;Scissoring;Shuffle;Trunk flexed Gait velocity: decr   General Gait Details: min cues for posture, position from RW and initial sequence   Stairs             Wheelchair Mobility    Modified Rankin (Stroke Patients Only)       Balance Overall balance assessment: Needs assistance Sitting-balance support: Feet supported;No upper extremity supported Sitting balance-Leahy Scale: Good     Standing balance support: No upper extremity supported Standing balance-Leahy Scale: Fair                              Cognition Arousal/Alertness: Awake/alert Behavior During Therapy: WFL for tasks assessed/performed Overall Cognitive Status: Within Functional Limits for tasks assessed                                        Exercises Total Joint Exercises Ankle Circles/Pumps: AROM;Both;15 reps;Supine Quad Sets: AROM;Both;10 reps;Supine Heel Slides: AAROM;Left;20 reps;Supine Hip ABduction/ADduction: AAROM;Left;15 reps;Supine    General Comments        Pertinent Vitals/Pain Pain Assessment: 0-10 Pain Score: 6  Pain Location: L hip and thigh Pain Descriptors / Indicators: Aching;Sore Pain Intervention(s): Limited activity within patient's tolerance;Monitored during session;Premedicated before session;Ice applied    Home Living                      Prior Function  PT Goals (current goals can now be found in the care plan section) Acute Rehab PT Goals Patient Stated Goal: Regain IND PT Goal Formulation: With patient Time For Goal Achievement: 10/24/20 Potential to Achieve Goals: Good Progress towards PT goals: Progressing toward goals    Frequency    7X/week      PT Plan Current plan remains appropriate    Co-evaluation              AM-PAC PT "6 Clicks" Mobility   Outcome  Measure  Help needed turning from your back to your side while in a flat bed without using bedrails?: A Little Help needed moving from lying on your back to sitting on the side of a flat bed without using bedrails?: A Little Help needed moving to and from a bed to a chair (including a wheelchair)?: A Little Help needed standing up from a chair using your arms (e.g., wheelchair or bedside chair)?: A Little Help needed to walk in hospital room?: A Little Help needed climbing 3-5 steps with a railing? : A Lot 6 Click Score: 17    End of Session Equipment Utilized During Treatment: Gait belt Activity Tolerance: Patient tolerated treatment well Patient left: in bed;with call bell/phone within reach;with bed alarm set;with nursing/sitter in room Nurse Communication: Mobility status;Patient requests pain meds PT Visit Diagnosis: Unsteadiness on feet (R26.81);Difficulty in walking, not elsewhere classified (R26.2);Pain Pain - Right/Left: Left Pain - part of body: Hip     Time: 0831-0905 PT Time Calculation (min) (ACUTE ONLY): 34 min  Charges:  $Gait Training: 8-22 mins $Therapeutic Exercise: 8-22 mins                     Mauro Kaufmann PT Acute Rehabilitation Services Pager [phone] Office [phone]    Jodi Gilmore 10/19/2020, 9:11 AM

## 2020-10-19 NOTE — Progress Notes (Signed)
Patient discharging with DME (rolling walker and 3n1 BSC)  Dr. Ophelia Charter to send in zofran this afternoon when he gets to a computer. Patient verified all other scripts are waiting at pharmacy.

## 2020-10-19 NOTE — Discharge Summary (Signed)
Patient ID: Jodi Gilmore MRN: [id number] DOB/AGE: [birth year] 65 y.o.  Admit date: 10/17/2020 Discharge date: 10/19/2020  Admission Diagnoses:  Principal Problem:   Unilateral primary osteoarthritis, left hip Active Problems:   Status post left hip replacement   Discharge Diagnoses:  Same  Past Medical History:  Diagnosis Date   Allergy    Anxiety    situational    Asthma    yeras ago - not current    Behcet's disease ([place])    Bell's palsy    Cataract    both removed    Depression    situational -    Diplopia    Elevated blood pressure reading    no BP meds currently    Family history of adverse reaction to anesthesia    GERD (gastroesophageal reflux disease)    Graves disease 1991   Heart murmur    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 female who was admitted 10/17/2020 for operative treatment ofUnilateral primary osteoarthritis, left hip. Patient has severe unremitting pain that affects sleep, daily activities, and work/hobbies. After pre-op clearance the patient was taken to the operating room on 10/17/2020 and underwent  Procedure(s): LEFT TOTAL HIP ARTHROPLASTY ANTERIOR APPROACH.    Patient was given perioperative antibiotics:  Anti-infectives (From admission, onward)    Start     Dose/Rate Route Frequency Ordered Stop   10/17/20 1600  ceFAZolin (ANCEF) IVPB 1 g/50 mL premix        1 g 100 mL/hr over 30 Minutes Intravenous  Every 6 hours 10/17/20 1443 10/17/20 2157   10/17/20 0730  ceFAZolin (ANCEF) IVPB 2g/100 mL premix        2 g 200 mL/hr over 30 Minutes Intravenous On call to O.R. 10/17/20 0726 10/17/20 1056        Patient was given sequential compression devices, early ambulation, and chemoprophylaxis to prevent DVT.  Patient benefited maximally from hospital stay and there were no complications.    Recent vital signs: Patient Vitals for the past 24 hrs:  BP Temp Temp src Pulse Resp SpO2  10/19/20 0440 114/62 98.5 F (36.9 C) Oral 95 18 95 %  10/18/20 2029 115/60 98.7 F (37.1 C) Oral 89 16 90 %     Recent laboratory studies:  Recent Labs    10/18/20 0312  WBC 16.8*  HGB 10.7*  HCT 32.5*  PLT 319  NA 142  K 3.6  CL 105  CO2 28  BUN 15  CREATININE 0.68  GLUCOSE 104*  CALCIUM 9.5     Discharge Medications:   Allergies as of 10/19/2020       Reactions   Contrast Media [iodinated Diagnostic Agents] Anaphylaxis   Prohance [gadoteridol] Anaphylaxis   Ptu [propylthiouracil]    Sulfa Antibiotics    Unknown reaction, family history   Tapazole [methimazole]  Latex Hives, Rash        Medication List     STOP taking these medications    HYDROcodone-acetaminophen 5-325 MG tablet Commonly known as: NORCO/VICODIN       TAKE these medications    acetaminophen 325 MG tablet Commonly known as: TYLENOL Take 650 mg by mouth every 6 (six) hours as needed for moderate pain.   amphetamine-dextroamphetamine 5 MG tablet Commonly known as: Adderall Take 1 tablet (5 mg total) by mouth daily.   aspirin 81 MG chewable tablet Chew 1 tablet (81 mg total) by mouth 2 (two) times daily.   aspirin-acetaminophen-caffeine 250-250-65 MG tablet Commonly known as: EXCEDRIN MIGRAINE Take 2 tablets by mouth every 6 (six) hours as needed for headache.   atorvastatin 40 MG tablet Commonly known as: LIPITOR Take 1 tablet (40 mg total) by mouth daily.   cetirizine 10 MG tablet Commonly  known as: ZyrTEC Allergy Take 1 tablet (10 mg total) by mouth daily.   diclofenac Sodium 1 % Gel Commonly known as: Voltaren Apply 4 g topically 4 (four) times daily. What changed:  when to take this reasons to take this   FEVERFEW PO Take 1 tablet by mouth 3 (three) times daily.   FLUoxetine 40 MG capsule Commonly known as: PROZAC Take 1 capsule (40 mg total) by mouth daily.   fluticasone 50 MCG/ACT nasal spray Commonly known as: FLONASE Place 2 sprays into both nostrils daily.   glucosamine-chondroitin 500-400 MG tablet Take 1 tablet by mouth 3 (three) times daily.   hydroxypropyl methylcellulose / hypromellose 2.5 % ophthalmic solution Commonly known as: ISOPTO TEARS / GONIOVISC Place 1 drop into both eyes 3 (three) times daily as needed for dry eyes.   levothyroxine 88 MCG tablet Commonly known as: SYNTHROID Take 1 tablet (88 mcg total) by mouth every morning. 30 minutes before food   lisinopril 2.5 MG tablet Commonly known as: ZESTRIL Take 1 tablet (2.5 mg total) by mouth at bedtime.   MAG-OX 400 PO Take 400 mg by mouth in the morning and at bedtime.   Melatonin 1 MG Subl   methocarbamol 500 MG tablet Commonly known as: ROBAXIN Take 1 tablet (500 mg total) by mouth every 6 (six) hours as needed for muscle spasms.   multivitamin tablet Take 1 tablet by mouth daily.   OMEGA-3 EPA FISH OIL PO Take 3 capsules by mouth daily.   omeprazole 20 MG capsule Commonly known as: PRILOSEC TAKE ONE CAPSULE BY MOUTH IN THE MORNING AND AT BEDTIME   ondansetron 4 MG tablet Commonly known as: ZOFRAN Take 1 tablet (4 mg total) by mouth every 6 (six) hours as needed for nausea.   OVER THE COUNTER MEDICATION Take 4 capsules by mouth at bedtime. Calm Forte   oxyCODONE 5 MG immediate release tablet Commonly known as: Oxy IR/ROXICODONE Take 1-2 tablets (5-10 mg total) by mouth every 6 (six) hours as needed for moderate pain (pain score 4-6).   Potassium Bicarbonate 99 MG  Caps Take 99 mg by mouth 3 (three) times daily.   PROBIOTIC-PREBIOTIC PO Take 1 capsule by mouth daily.   Restasis 0.05 % ophthalmic emulsion Generic drug: cycloSPORINE INSTILL ONE DROP INTO EACH EYE TWICE DAILY   rizatriptan 10 MG disintegrating tablet Commonly known as: Maxalt-MLT Take 1 tablet (10 mg total) by mouth as needed for migraine. May repeat in 2 hours if needed   rizatriptan 10 MG tablet Commonly known as: Maxalt Take 1 tablet (10 mg total) by mouth as needed  for migraine. May repeat in 2 hours if needed   traMADol 50 MG tablet Commonly known as: ULTRAM TAKE ONE TABLET BY MOUTH EVERY 6 HOURS AS NEEDED   TURMERIC PO Take 1 capsule by mouth in the morning, at noon, and at bedtime.   VITAMIN B COMPLEX PO Take 1 tablet by mouth daily.   Vitamin D 125 MCG (5000 UT) Caps Take 10,000 Units by mouth daily.   ziprasidone 40 MG capsule Commonly known as: Geodon Take 1 capsule (40 mg total) by mouth 2 (two) times daily with a meal.               Durable Medical Equipment  (From admission, onward)           Start     Ordered   10/17/20 1444  DME 3 n 1  Once        10/17/20 1443   10/17/20 1444  DME Walker rolling  Once       Question Answer Comment  Walker: With [place] Wheels   Patient needs a walker to treat with the following condition Status post left hip replacement      10/17/20 1443            Diagnostic Studies: DG Pelvis Portable  Result Date: 10/17/2020 CLINICAL DATA:  Post lung total hip arthroplasty EXAM: PORTABLE PELVIS 1-2 VIEWS COMPARISON:  Portable exam 1207 hours compared to earlier intraoperative images of same date FINDINGS: LEFT hip prosthesis identified. No fracture or dislocation. Bones appear demineralized. SI joints and RIGHT hip joint space preserved. IMPRESSION: LEFT hip arthroplasty without acute complication. Electronically Signed   By: Lavonia Dana M.D.   On: 10/17/2020 14:54   DG C-Arm 1-60 Min-No Report  Result  Date: 10/17/2020 Fluoroscopy was utilized by the requesting physician.  No radiographic interpretation.   DG HIP OPERATIVE UNILAT W OR W/O PELVIS LEFT  Result Date: 10/17/2020 CLINICAL DATA:  LEFT hip replacement EXAM: OPERATIVE LEFT HIP (WITH PELVIS IF PERFORMED) 5 VIEWS TECHNIQUE: Fluoroscopic spot image(s) were submitted for interpretation post-operatively. COMPARISON:  05/08/2020 FLUOROSCOPY TIME:  0 minute 16 seconds Dose: 2.29 mGy FINDINGS: Osseous demineralization. Baseline osteoarthritic changes LEFT hip. Images demonstrate sequential placement of a LEFT total hip prosthesis. No fracture or dislocation identified. IMPRESSION: LEFT hip arthroplasty without acute complication. Electronically Signed   By: Lavonia Dana M.D.   On: 10/17/2020 14:54    Disposition: Discharge disposition: 01-Home or Self Care          Follow-up Information     Mcarthur Rossetti, MD. Schedule an appointment as soon as possible for a visit on 10/30/2020.   Specialty: Orthopedic Surgery Contact information: [address] [place] [address] [phone]                  Signed: Mcarthur Rossetti 10/19/2020, 2:55 PM

## 2020-10-19 NOTE — Plan of Care (Signed)
Patient dc'd  

## 2020-10-19 NOTE — TOC Transition Note (Signed)
Transition of Care [facility]) - CM/SW Discharge Note   Patient Details  Name: Jodi Gilmore MRN: [id number] Date of Birth: 1955/08/19  Transition of Care [facility]) CM/SW Contact:  Darleene Cleaver, LCSW Phone Number: 10/19/2020, 10:14 AM   Clinical Narrative:     Patient will be going home with home health PT and aide through [facility].  CSW signing off please reconsult with any other social work needs, home health agency has been notified of planned discharge.  Patient already has DME 3 in 1 and walker in room.      Final next level of care: Home w Home Health Services Barriers to Discharge: Barriers Resolved   Patient Goals and CMS Choice Patient states their goals for this hospitalization and ongoing recovery are:: To return back home with home health CMS Medicare.gov Compare Post Acute Care list provided to:: Patient Choice offered to / list presented to : Patient  Discharge Placement                       Discharge Plan and Services In-house Referral: Clinical Social Work   Post Acute Care Choice: Durable Medical Equipment, Home Health          DME Arranged: Walker rolling, Bedside commode DME Agency: [employer] Date DME Agency Contacted: 10/18/20 Time DME Agency Contacted: 1012 Representative spoke with at DME Agency: Leavy Cella HH Arranged: PT, Nurse's Aide HH Agency: [facility] Date [facility] Agency Contacted: 10/19/20 Time HH Agency Contacted: 1014 Representative spoke with at [facility] Agency: Stacie  Social Determinants of Health (SDOH) Interventions     Readmission Risk Interventions No flowsheet data found.

## 2020-10-19 NOTE — Progress Notes (Signed)
Physical Therapy Treatment Patient Details Name: Jodi Gilmore MRN: [id number] DOB: 1955-01-31 Today's Date: 10/19/2020   History of Present Illness Pt s/p L THr and with hx of Bells Palsy, Diplopia, MOrphea Scleroderma, Psoriatic arthritis, Sjogren's Syndrome, Bipolar, ADHD, and anxiety    PT Comments    Pt continues to progress well with mobility and currently able to perform bed mobility, transfers and ambulation without physical assist and with min cues for technique.  Pt feeling much more comfortable about dc home.   Recommendations for follow up therapy are one component of a multi-disciplinary discharge planning process, led by the attending physician.  Recommendations may be updated based on patient status, additional functional criteria and insurance authorization.  Follow Up Recommendations  Home health PT;Follow surgeon's recommendation for DC plan and follow-up therapies     Equipment Recommendations  Rolling walker with 5" wheels;3in1 (PT)    Recommendations for Other Services       Precautions / Restrictions Precautions Precautions: Fall Restrictions Weight Bearing Restrictions: No Other Position/Activity Restrictions: WBAT     Mobility  Bed Mobility Overal bed mobility: Needs Assistance Bed Mobility: Supine to Sit;Sit to Supine     Supine to sit: Supervision Sit to supine: Supervision   General bed mobility comments: Increased time with min cues for sequence and use of R LE to self assist; Use of gait belt OOB bed and R LE assisting L back into bed    Transfers Overall transfer level: Needs assistance Equipment used: Rolling walker (2 wheeled) Transfers: Sit to/from [employer] Sit to Stand: Supervision Stand pivot transfers: Supervision       General transfer comment: min cues for LE management and use of UEs to self assist;  Ambulation/Gait Ambulation/Gait assistance: Min guard;Supervision Gait Distance (Feet): 220  Feet Assistive device: Rolling walker (2 wheeled) Gait Pattern/deviations: Step-to pattern;Step-through pattern;Decreased step length - right;Decreased step length - left;Scissoring;Shuffle;Trunk flexed Gait velocity: decr   General Gait Details: min cues for posture, position from RW and initial sequence   Stairs             Wheelchair Mobility    Modified Rankin (Stroke Patients Only)       Balance Overall balance assessment: Needs assistance Sitting-balance support: Feet supported;No upper extremity supported Sitting balance-Leahy Scale: Good     Standing balance support: No upper extremity supported Standing balance-Leahy Scale: Fair                              Cognition Arousal/Alertness: Awake/alert Behavior During Therapy: WFL for tasks assessed/performed Overall Cognitive Status: Within Functional Limits for tasks assessed                                        Exercises Total Joint Exercises Ankle Circles/Pumps: AROM;Both;15 reps;Supine Quad Sets: AROM;Both;10 reps;Supine Heel Slides: AAROM;Left;20 reps;Supine Hip ABduction/ADduction: AAROM;Left;15 reps;Supine    General Comments        Pertinent Vitals/Pain Pain Assessment: 0-10 Pain Score: 5  Pain Location: L hip and thigh Pain Descriptors / Indicators: Aching;Sore Pain Intervention(s): Limited activity within patient's tolerance;Monitored during session;Premedicated before session;Ice applied    Home Living                      Prior Function            PT Goals (  current goals can now be found in the care plan section) Acute Rehab PT Goals Patient Stated Goal: Regain IND PT Goal Formulation: With patient Time For Goal Achievement: 10/24/20 Potential to Achieve Goals: Good Progress towards PT goals: Progressing toward goals    Frequency    7X/week      PT Plan Current plan remains appropriate    Co-evaluation              AM-PAC  PT "6 Clicks" Mobility   Outcome Measure  Help needed turning from your back to your side while in a flat bed without using bedrails?: A Little Help needed moving from lying on your back to sitting on the side of a flat bed without using bedrails?: A Little Help needed moving to and from a bed to a chair (including a wheelchair)?: A Little Help needed standing up from a chair using your arms (e.g., wheelchair or bedside chair)?: A Little Help needed to walk in hospital room?: A Little Help needed climbing 3-5 steps with a railing? : A Lot 6 Click Score: 17    End of Session Equipment Utilized During Treatment: Gait belt Activity Tolerance: Patient tolerated treatment well Patient left: in bed;with call bell/phone within reach;with bed alarm set;with nursing/sitter in room Nurse Communication: Mobility status;Patient requests pain meds PT Visit Diagnosis: Unsteadiness on feet (R26.81);Difficulty in walking, not elsewhere classified (R26.2);Pain Pain - Right/Left: Left Pain - part of body: Hip     Time: 1222-1256 PT Time Calculation (min) (ACUTE ONLY): 34 min  Charges:  $Gait Training: 8-22 mins $Therapeutic Exercise: 8-22 mins                     Mauro Kaufmann PT Acute Rehabilitation Services Pager [phone] Office [phone]    Herb Beltre 10/19/2020, 1:11 PM

## 2020-10-20 ENCOUNTER — Encounter (HOSPITAL_COMMUNITY): Payer: Self-pay | Admitting: Orthopaedic Surgery

## 2020-10-21 ENCOUNTER — Telehealth (HOSPITAL_COMMUNITY): Payer: Self-pay | Admitting: *Deleted

## 2020-10-21 ENCOUNTER — Encounter: Payer: Self-pay | Admitting: Orthopaedic Surgery

## 2020-10-21 NOTE — Telephone Encounter (Signed)
Called & Spoke with [employer] Rx # [id number] Patient Id # [id number] FLUoxetine (PROZAC) 40 MG capsule Does not require a P.A. Test Claim  ---  Payable Covered  Cost $1.00.  Called Rx [facility] to Notify

## 2020-10-22 ENCOUNTER — Telehealth (INDEPENDENT_AMBULATORY_CARE_PROVIDER_SITE_OTHER): Payer: No Payment, Other | Admitting: Physician Assistant

## 2020-10-22 ENCOUNTER — Other Ambulatory Visit: Payer: Self-pay

## 2020-10-22 ENCOUNTER — Other Ambulatory Visit: Payer: Self-pay | Admitting: Orthopaedic Surgery

## 2020-10-22 ENCOUNTER — Encounter (HOSPITAL_COMMUNITY): Payer: Self-pay | Admitting: Physician Assistant

## 2020-10-22 DIAGNOSIS — F909 Attention-deficit hyperactivity disorder, unspecified type: Secondary | ICD-10-CM | POA: Diagnosis not present

## 2020-10-22 DIAGNOSIS — F3160 Bipolar disorder, current episode mixed, unspecified: Secondary | ICD-10-CM | POA: Diagnosis not present

## 2020-10-22 DIAGNOSIS — F411 Generalized anxiety disorder: Secondary | ICD-10-CM

## 2020-10-22 NOTE — Telephone Encounter (Signed)
ok 

## 2020-10-22 NOTE — Progress Notes (Addendum)
[place] MD/PA/NP OP Progress Note  Virtual Visit via Video Note  I connected with Jodi Gilmore on 10/26/20 at  4:00 PM EDT by a video enabled telemedicine application and verified that I am speaking with the correct person using two identifiers.  Location: Patient: Home Provider: Clinic   I discussed the limitations of evaluation and management by telemedicine and the availability of in person appointments. The patient expressed understanding and agreed to proceed.  Follow Up Instructions:  I discussed the assessment and treatment plan with the patient. The patient was provided an opportunity to ask questions and all were answered. The patient agreed with the plan and demonstrated an understanding of the instructions.   The patient was advised to call back or seek an in-person evaluation if the symptoms worsen or if the condition fails to improve as anticipated.  I provided 29 minutes of non-face-to-face time during this encounter.  Malachy Mood, PA   10/22/2020 4:24 PM Jodi Gilmore  MRN:  [id number]  Chief Complaint: Follow up and medication management  HPI:   Nastassja Witkop "Chong Sicilian" is a 65 year old female with a psychiatric history significant for bipolar disorder, ADHD, generalized anxiety disorder, and social anxiety who presents to [facility] via virtual video visit for follow-up and medication management.  Patient is currently being managed on the following medications:  Ziprasidone 40 mg 2 times daily Fluoxetine 40 mg daily for Adderall 5 mg daily  Patient reports that she recently underwent hip replacement surgery.  Patient hopes that this surgery will help to become more active once she is healed.  She reports that she has been sedentary for the last 2 years and is hopeful that the surgery will allow some doors to give way to opportunity.  In regards to medications, patient reports that her Prozac makes her feel groggy in the  morning so she has now started taking it at night.  She also states that she feels her Adderall is at a low dose.  Patient still expresses that her concentration is lacking and she feels that her concentration should be improved especially when she starts to undergo physical therapy and occupational therapy.  Patient states that she has not noticed any major improvements in her mood but she does notice that she has been doing activities that would suggest some improvements and depression.  For example, patient states that she recently dyed her hair and she has now also started eating better.  In regards to stressors, patient states that she recently was informed that she had a torn meniscus that may need surgery.  Patient is stressed that this new injury may impede her recovery.  Patient also states that she is stressed out about being able to hold a job due to her age and having limited skills.  A PHQ-9 screen was performed with the patient scoring a 17.  A GAD-7 screen was also performed with the patient scoring an 8.  Patient is alert and oriented x4, calm, cooperative, and fully engaged in conversation during the encounter.  Patient states that she is currently depressed and hopeless and tired of being frustrated.  Patient denies suicidal or homicidal ideations.  She further denies auditory or visual hallucinations and does not appear to be responding to internal/external stimuli.  Patient endorses fair sleep receiving on average 5 hours of sleep each night.  She states that her sleep is often impaired by nightmares but attributes those nightmares to watching true crime mysteries prior to sleeping.  Patient endorses good appetite and eats on average 3 meals per day.  Patient denies alcohol consumption, tobacco use, and illicit drug use.  Visit Diagnosis:    ICD-10-CM   1. Bipolar affective disorder, current episode mixed, current episode severity unspecified ([place])  F31.60 ziprasidone (GEODON) 40 MG  capsule    FLUoxetine (PROZAC) 20 MG capsule    2. Generalized anxiety disorder  F41.1 FLUoxetine (PROZAC) 20 MG capsule    3. Attention deficit hyperactivity disorder (ADHD), unspecified ADHD type  F90.9 amphetamine-dextroamphetamine (ADDERALL XR) 10 MG 24 hr capsule      Past Psychiatric History:  Bipolar Disorder ADHD Generalized anxiety disorder Social anxiety  Past Medical History:  Past Medical History:  Diagnosis Date   Allergy    Anxiety    situational    Asthma    yeras ago - not current    Behcet's disease ([place])    Bell's palsy    Cataract    both removed    Depression    situational -    Diplopia    Elevated blood pressure reading    no BP meds currently    Family history of adverse reaction to anesthesia    GERD (gastroesophageal reflux disease)    Graves disease 1991   Heart murmur    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    Hip pain    HLA B27 (HLA B27 positive)    Hyperlipidemia    Hypertension    Knee pain    Memory changes    Morphea scleroderma    Neuromuscular disorder ([place])    Raynauds    Pneumonia    Psoriatic arthritis ([place])    seen in ears only   Raynaud's disease    Sjogren syndrome with dental involvement ([place])     Past Surgical History:  Procedure Laterality Date   CYST REMOVAL HAND     left arm posterior   laproscopy     SALPINGECTOMY  1980   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   Procedure: LEFT TOTAL HIP ARTHROPLASTY ANTERIOR APPROACH;  Surgeon: Mcarthur Rossetti, MD;  Location: [facility];  Service: Orthopedics;  Laterality: Left;   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      Family Psychiatric History:  Nephew - Schizophrenia Father - patient suspects that her father may be bipolar  Family History:  Family History  Problem Relation Age of Onset   Heart disease Mother    Congestive Heart Failure Mother    Dementia Mother    Heart disease Father    Heart attack Father    Dementia  Sister    Hemachromatosis Sister    Parkinson's disease Sister    Allergies Son    Healthy Son    Colon cancer Neg Hx    Colon polyps Neg Hx    Esophageal cancer Neg Hx    Stomach cancer Neg Hx    Rectal cancer Neg Hx     Social History:  Social History   Socioeconomic History   Marital status: Single    Spouse name: Not on file   Number of children: Not on file   Years of education: Not on file   Highest education level: Not on file  Occupational History   Not on file  Tobacco Use   Smoking status: Former   Smokeless tobacco: Never  Vaping Use   Vaping Use: Never used  Substance and Sexual Activity   Alcohol use: Not Currently  Drug use: Never   Sexual activity: Not on file  Other Topics Concern   Not on file  Social History Narrative   Not on file   Social Determinants of Health   Financial Resource Strain: Not on file  Food Insecurity: Not on file  Transportation Needs: Not on file  Physical Activity: Not on file  Stress: Not on file  Social Connections: Not on file    Allergies:  Allergies  Allergen Reactions   Contrast Media [Iodinated Diagnostic Agents] Anaphylaxis   Prohance [Gadoteridol] Anaphylaxis   Ptu [Propylthiouracil]    Sulfa Antibiotics     Unknown reaction, family history   Tapazole [Methimazole]    Latex Hives and Rash    Metabolic Disorder Labs: Lab Results  Component Value Date   HGBA1C 5.2 01/01/2020   No results found for: PROLACTIN Lab Results  Component Value Date   CHOL 272 (H) 02/12/2020   TRIG 145 02/12/2020   HDL 71 02/12/2020   CHOLHDL 3.8 02/12/2020   LDLCALC 175 (H) 02/12/2020   Lab Results  Component Value Date   TSH 0.090 (L) 09/25/2020   TSH 0.01 (L) 09/01/2020    Therapeutic Level Labs: No results found for: LITHIUM No results found for: VALPROATE No components found for:  CBMZ  Current Medications: Current Outpatient Medications  Medication Sig Dispense Refill   amphetamine-dextroamphetamine  (ADDERALL XR) 10 MG 24 hr capsule Take 1 capsule (10 mg total) by mouth daily. 30 capsule 0   FLUoxetine (PROZAC) 20 MG capsule Take 3 capsules (60 mg total) by mouth daily. 90 capsule 1   acetaminophen (TYLENOL) 325 MG tablet Take 650 mg by mouth every 6 (six) hours as needed for moderate pain.     aspirin 81 MG chewable tablet Chew 1 tablet (81 mg total) by mouth 2 (two) times daily. 60 tablet 0   aspirin-acetaminophen-caffeine (EXCEDRIN MIGRAINE) 250-250-65 MG tablet Take 2 tablets by mouth every 6 (six) hours as needed for headache.     atorvastatin (LIPITOR) 40 MG tablet Take 1 tablet (40 mg total) by mouth daily. 90 tablet 3   B Complex Vitamins (VITAMIN B COMPLEX PO) Take 1 tablet by mouth daily.     Bacillus Coagulans-Inulin (PROBIOTIC-PREBIOTIC PO) Take 1 capsule by mouth daily.     cetirizine (ZYRTEC ALLERGY) 10 MG tablet Take 1 tablet (10 mg total) by mouth daily. 90 tablet 1   Cholecalciferol (VITAMIN D) 125 MCG (5000 UT) CAPS Take 10,000 Units by mouth daily.     diclofenac Sodium (VOLTAREN) 1 % GEL Apply 4 g topically 4 (four) times daily. (Patient taking differently: Apply 4 g topically daily as needed (pain).) 150 g 4   FEVERFEW PO Take 1 tablet by mouth 3 (three) times daily.     fluticasone (FLONASE) 50 MCG/ACT nasal spray Place 2 sprays into both nostrils daily. 16 g 6   glucosamine-chondroitin 500-400 MG tablet Take 1 tablet by mouth 3 (three) times daily.     hydroxypropyl methylcellulose / hypromellose (ISOPTO TEARS / GONIOVISC) 2.5 % ophthalmic solution Place 1 drop into both eyes 3 (three) times daily as needed for dry eyes.     levothyroxine (SYNTHROID) 88 MCG tablet Take 1 tablet (88 mcg total) by mouth every morning. 30 minutes before food 60 tablet 0   lisinopril (ZESTRIL) 2.5 MG tablet Take 1 tablet (2.5 mg total) by mouth at bedtime. 90 tablet 3   Magnesium Oxide (MAG-OX 400 PO) Take 400 mg by mouth in the morning and  at bedtime.     Melatonin 1 MG SUBL       methocarbamol (ROBAXIN) 500 MG tablet Take 1 tablet (500 mg total) by mouth every 6 (six) hours as needed for muscle spasms. 40 tablet 1   Multiple Vitamin (MULTIVITAMIN) tablet Take 1 tablet by mouth daily.     Omega-3 Fatty Acids (OMEGA-3 EPA FISH OIL PO) Take 3 capsules by mouth daily.     omeprazole (PRILOSEC) 20 MG capsule TAKE ONE CAPSULE BY MOUTH IN THE MORNING AND AT BEDTIME 90 capsule 0   ondansetron (ZOFRAN) 4 MG tablet Take 1 tablet (4 mg total) by mouth every 6 (six) hours as needed for nausea. 20 tablet 0   OVER THE COUNTER MEDICATION Take 4 capsules by mouth at bedtime. Calm Forte     oxyCODONE (OXY IR/ROXICODONE) 5 MG immediate release tablet TAKE ONE TO TWO TABLETS BY MOUTH EVERY 6 HOURS AS NEEDED FOR MODERATE PAIN (PAIN SCORE 4-6) 30 tablet 0   Potassium Bicarbonate 99 MG CAPS Take 99 mg by mouth 3 (three) times daily.     RESTASIS 0.05 % ophthalmic emulsion INSTILL ONE DROP INTO EACH EYE TWICE DAILY 60 each 3   rizatriptan (MAXALT) 10 MG tablet Take 1 tablet (10 mg total) by mouth as needed for migraine. May repeat in 2 hours if needed 10 tablet 0   rizatriptan (MAXALT-MLT) 10 MG disintegrating tablet Take 1 tablet (10 mg total) by mouth as needed for migraine. May repeat in 2 hours if needed (Patient not taking: No sig reported) 10 tablet 0   traMADol (ULTRAM) 50 MG tablet TAKE ONE TABLET BY MOUTH EVERY 6 HOURS AS NEEDED 30 tablet 0   TURMERIC PO Take 1 capsule by mouth in the morning, at noon, and at bedtime.     ziprasidone (GEODON) 40 MG capsule Take 1 capsule (40 mg total) by mouth 2 (two) times daily with a meal. 60 capsule 1   No current facility-administered medications for this visit.     Musculoskeletal: Strength & Muscle Tone: Unable to assess due to telemedicine visit [place]: Unable to assess due to telemedicine visit Patient leans: Unable to assess due to telemedicine visit  Psychiatric Specialty Exam: Review of Systems  Psychiatric/Behavioral:   Positive for decreased concentration and sleep disturbance. Negative for dysphoric mood, hallucinations, self-injury and suicidal ideas. The patient is not nervous/anxious and is not hyperactive.    There were no vitals taken for this visit.There is no height or weight on file to calculate BMI.  General Appearance: Fairly Groomed  Eye Contact:  Good  Speech:  Clear and Coherent and Normal Rate  Volume:  Normal  Mood:  Anxious and Depressed  Affect:  Congruent and Depressed  Thought Process:  Coherent, Goal Directed, and Descriptions of Associations: Intact  Orientation:  Full (Time, Place, and Person)  Thought Content: WDL   Suicidal Thoughts:  No  Homicidal Thoughts:  No  Memory:  Immediate;   Fair Recent;   Fair Remote;   Fair  Judgement:  Good  Insight:  Good  Psychomotor Activity:  Normal  Concentration:  Concentration: Good and Attention Span: Good  Recall:  Good  Fund of Knowledge: Good  Language: Good  Akathisia:  NA  Handed:  Right  AIMS (if indicated): not done  Assets:  Communication Skills Desire for Improvement Housing  ADL's:  Intact  Cognition: WNL  Sleep:  Fair   Screenings: GAD-7    Flowsheet Row Video Visit from 10/22/2020  in [facility] Video Visit from 08/28/2020 in [facility] Video Visit from 07/15/2020 in [facility] Video Visit from 06/04/2020 in [facility] [address]  Total GAD-7 Score _0 [id number]    Flowsheet Row Video Visit from 10/22/2020 in [facility] Office Visit from 09/25/2020 in [place] Video Visit from 08/28/2020 in [facility] Video Visit from 07/15/2020 in [facility] Office Visit from 06/24/2020 in [place]  PHQ-2 Total Score _1 PHQ-9 Total Score _2 Flowsheet Row Video  Visit from 10/22/2020 in [facility] Admission (Discharged) from 10/17/2020 in [place] [facility] from 10/07/2020 in [place] No Risk No Risk Error: Question 6 not populated        Assessment and Plan:   Nelda Luckey "Chong Sicilian" is a 65 year old female with a psychiatric history significant for bipolar disorder, ADHD, generalized anxiety disorder, and social anxiety who presents to [facility] via virtual video visit for follow-up and medication management.  Patient denies improvements in her mood but states that she has noticed actions that would suggest that her Prozac is helping.  Patient has noticed that her concentration still needs management especially now that she will be doing physical therapy and occupational therapy soon.  Patient was recommended increasing her dosage of Prozac from 40 mg to 60 mg daily for the management of her depressive episodes.  Patient to be placed on Adderall XR 10 mg daily for the management of her concentration.  Patient was agreeable to recommendation.  Patient's medications to be e-prescribed to pharmacy of choice.  1. Bipolar affective disorder, current episode mixed, current episode severity unspecified ([place])  - ziprasidone (GEODON) 40 MG capsule; Take 1 capsule (40 mg total) by mouth 2 (two) times daily with a meal.  Dispense: 60 capsule; Refill: 1 - FLUoxetine (PROZAC) 20 MG capsule; Take 3 capsules (60 mg total) by mouth daily.  Dispense: 90 capsule; Refill: 1  2. Generalized anxiety disorder  - FLUoxetine (PROZAC) 20 MG capsule; Take 3 capsules (60 mg total) by mouth daily.  Dispense: 90 capsule; Refill: 1  3. Attention deficit hyperactivity disorder (ADHD), unspecified ADHD type  - amphetamine-dextroamphetamine (ADDERALL XR) 10 MG 24 hr capsule; Take 1 capsule (10 mg total) by mouth  daily.  Dispense: 30 capsule; Refill: 0  Patient to follow up in 2 months Provider spent a total of 29 minutes with the patient reviewing patient's chart  Malachy Mood, PA 10/22/2020, 4:24 PM

## 2020-10-23 MED ORDER — ZIPRASIDONE HCL 40 MG PO CAPS: 40.0000 mg | ORAL_CAPSULE | Freq: Two times a day (BID) | ORAL | 1 refills | Status: DC

## 2020-10-23 MED ORDER — AMPHETAMINE-DEXTROAMPHET ER 10 MG PO CP24: 10.0000 mg | ORAL_CAPSULE | Freq: Every day | ORAL | 0 refills | Status: DC

## 2020-10-23 MED ORDER — FLUOXETINE HCL 20 MG PO CAPS: 60.0000 mg | ORAL_CAPSULE | Freq: Every day | ORAL | 1 refills | Status: DC

## 2020-10-25 ENCOUNTER — Encounter: Payer: Self-pay | Admitting: Family Medicine

## 2020-10-26 ENCOUNTER — Other Ambulatory Visit: Payer: Self-pay | Admitting: Orthopaedic Surgery

## 2020-10-27 ENCOUNTER — Ambulatory Visit: Payer: Medicare Other | Admitting: Family Medicine

## 2020-10-30 ENCOUNTER — Encounter: Payer: Self-pay | Admitting: Orthopaedic Surgery

## 2020-10-30 ENCOUNTER — Other Ambulatory Visit: Payer: Self-pay

## 2020-10-30 ENCOUNTER — Telehealth: Payer: Self-pay | Admitting: Physician Assistant

## 2020-10-30 ENCOUNTER — Other Ambulatory Visit: Payer: Self-pay | Admitting: Orthopaedic Surgery

## 2020-10-30 ENCOUNTER — Ambulatory Visit: Payer: Self-pay

## 2020-10-30 ENCOUNTER — Ambulatory Visit (INDEPENDENT_AMBULATORY_CARE_PROVIDER_SITE_OTHER): Payer: Medicare Other | Admitting: Physician Assistant

## 2020-10-30 DIAGNOSIS — M25562 Pain in left knee: Secondary | ICD-10-CM | POA: Diagnosis not present

## 2020-10-30 DIAGNOSIS — G8929 Other chronic pain: Secondary | ICD-10-CM | POA: Diagnosis not present

## 2020-10-30 DIAGNOSIS — Z96642 Presence of left artificial hip joint: Secondary | ICD-10-CM | POA: Diagnosis not present

## 2020-10-30 NOTE — Telephone Encounter (Signed)
Pt called stating it is very important PA Chestine Spore or Danella Maiers calls her right away. Pt did not tell the reason of her call. Please call pt at [phone].

## 2020-10-30 NOTE — Telephone Encounter (Signed)
Pt portal message sent to [facility]

## 2020-10-30 NOTE — Telephone Encounter (Signed)
ok 

## 2020-10-30 NOTE — Progress Notes (Signed)
Office Visit Note   Patient: Jodi Gilmore           Date of Birth: [birth year]           MRN: [id number] Visit Date: 10/30/2020              Requested by: Eulis Foster, MD [address],  [place] [address] PCP: Eulis Foster, MD   Assessment & Plan: Visit Diagnoses:  1. Chronic pain of left knee   2. Status post total replacement of left hip     Plan: Given patient's continued left knee pain despite conservative treatment and the fact that is greatly inhibiting her rehab of her left hip recommend MRI of the knee to rule out internal derangement/meniscal tear.  Have her follow-up after the MRI to go over results discuss further treatment.  In regards to her left hip she is weightbearing as tolerated.  Staples were removed today Steri-Strips applied.  Scar tissue mobilization encouraged.  She we will continue on 81 mg aspirin once daily for another week and then discontinue as she was on no aspirin prior to surgery.  Continue left knee brace.  Follow-Up Instructions: Return After MRI.   Orders:  Orders Placed This Encounter  Procedures   XR Knee 1-2 Views Left   No orders of the defined types were placed in this encounter.     Procedures: No procedures performed   Clinical Data: No additional findings.   Subjective: Chief Complaint  Patient presents with   Left Hip - Routine Post Op    2 weeks postop   Left Knee - Pain    HPI Mrs. Behrens returns today 2-week status post left total hip arthroplasty.  She states left hip is overall doing well.  However her left knee pain which has been bothering her since late September is getting worse and is inhibiting her from walking for exercise in regards to the left hip.  She states that the left knee gives way on her.  She notes that it also at times feels as if he walks.  She worn a hinged knee brace.  She is icing the knee.  She did have an injection in the knee on 10/08/2020 which gave her no  relief.  Denies any acute injury to the knee.  Review of Systems Negative for fevers or chills.  Objective: Vital Signs: There were no vitals taken for this visit.  Physical Exam Constitutional:      Appearance: She is not ill-appearing or diaphoretic.  Pulmonary:     Effort: Pulmonary effort is normal.  Neurological:     Mental Status: She is alert and oriented to person, place, and time.  Psychiatric:        Mood and Affect: Mood normal.    Ortho Exam Left hip surgical incisions healing well no signs of infection.  Left calf supple nontender.  Left knee she has global tenderness.  No instability valgus varus stressing.  No abnormal warmth erythema or effusion left knee.  She has full extension flexion to 90 degrees flexion beyond 90 degrees causes pain.  Slight crepitus with passive range of motion of the knee.  Positive McMurray's. Specialty Comments:  No specialty comments available.  Imaging: XR Knee 1-2 Views Left  Result Date: 10/30/2020 Left knee 2 views: Left knee is well preserved throughout.  No acute fractures no bony abnormalities.  Knee is well located.    PMFS History: Patient Active Problem List   Diagnosis Date  Noted   Status post left hip replacement 10/17/2020   Unilateral primary osteoarthritis, left hip 10/16/2020   Chronic pain of left knee 10/08/2020   Bilateral hand pain 09/01/2020   Lower back injury 09/01/2020   Idiopathic scoliosis of lumbar spine 09/01/2020   Bipolar affective disorder, current episode mixed ([place]) 08/29/2020   Primary osteoarthritis of left hip 07/30/2020   Essential (primary) hypertension 07/17/2020   HSV (herpes simplex virus) anogenital infection 06/26/2020   Generalized anxiety disorder 06/04/2020   Attention deficit hyperactivity disorder (ADHD) 06/04/2020   Migraine headache with aura 05/17/2020   Sinusitis chronic, frontal 05/17/2020   HLD (hyperlipidemia) 05/17/2020   Elevated C-reactive protein (CRP) 02/13/2020    Other specified arthritis, left knee 02/13/2020   Hip arthritis 02/13/2020   Bipolar disorder ([place]) 02/13/2020   Hypothyroidism (acquired) 01/03/2020   Diarrhea in adult patient 01/03/2020   Brain fog 01/03/2020   Healthcare maintenance 01/03/2020   Past Medical History:  Diagnosis Date   Allergy    Anxiety    situational    Asthma    yeras ago - not current    Behcet's disease ([place])    Bell's palsy    Cataract    both removed    Depression    situational -    Diplopia    Elevated blood pressure reading    no BP meds currently    Family history of adverse reaction to anesthesia    GERD (gastroesophageal reflux disease)    Graves disease 1991   Heart murmur    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    Hip pain    HLA B27 (HLA B27 positive)    Hyperlipidemia    Hypertension    Knee pain    Memory changes    Morphea scleroderma    Neuromuscular disorder ([facility])    Raynauds    Pneumonia    Psoriatic arthritis ([place])    seen in ears only   Raynaud's disease    Sjogren syndrome with dental involvement ([place])     Family History  Problem Relation Age of Onset   Heart disease Mother    Congestive Heart Failure Mother    Dementia Mother    Heart disease Father    Heart attack Father    Dementia Sister    Hemachromatosis Sister    Parkinson's disease Sister    Allergies Son    Healthy Son    Colon cancer Neg Hx    Colon polyps Neg Hx    Esophageal cancer Neg Hx    Stomach cancer Neg Hx    Rectal cancer Neg Hx     Past Surgical History:  Procedure Laterality Date   CYST REMOVAL HAND     left arm posterior   laproscopy     SALPINGECTOMY  1980   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   Procedure: LEFT TOTAL HIP ARTHROPLASTY ANTERIOR APPROACH;  Surgeon: Mcarthur Rossetti, MD;  Location: [facility];  Service: Orthopedics;  Laterality: Left;   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     Social History   Occupational History   Not on file   Tobacco Use   Smoking status: Former   Smokeless tobacco: Never  Vaping Use   Vaping Use: Never used  Substance and Sexual Activity   Alcohol use: Not Currently   Drug use: Never   Sexual activity: Not on file

## 2020-10-31 ENCOUNTER — Other Ambulatory Visit: Payer: Self-pay

## 2020-10-31 DIAGNOSIS — G8929 Other chronic pain: Secondary | ICD-10-CM

## 2020-11-03 ENCOUNTER — Other Ambulatory Visit: Payer: Self-pay | Admitting: Orthopaedic Surgery

## 2020-11-03 NOTE — Telephone Encounter (Signed)
ok 

## 2020-11-04 ENCOUNTER — Encounter: Payer: Self-pay | Admitting: Orthopaedic Surgery

## 2020-11-05 ENCOUNTER — Other Ambulatory Visit: Payer: Self-pay | Admitting: Orthopaedic Surgery

## 2020-11-09 ENCOUNTER — Other Ambulatory Visit: Payer: Self-pay | Admitting: Orthopaedic Surgery

## 2020-11-11 ENCOUNTER — Encounter: Payer: Self-pay | Admitting: Orthopaedic Surgery

## 2020-11-11 ENCOUNTER — Other Ambulatory Visit: Payer: Self-pay | Admitting: Family Medicine

## 2020-11-11 ENCOUNTER — Telehealth (HOSPITAL_COMMUNITY): Payer: Self-pay | Admitting: *Deleted

## 2020-11-11 NOTE — Telephone Encounter (Signed)
Called and left a vm to tell us her insurance wont pay for extended release, they filled it once as a curtesy but wont fill it in the future.

## 2020-11-13 ENCOUNTER — Other Ambulatory Visit: Payer: Self-pay

## 2020-11-13 MED ORDER — METHOCARBAMOL 500 MG PO TABS: 500.0000 mg | ORAL_TABLET | Freq: Four times a day (QID) | ORAL | 1 refills | Status: DC | PRN

## 2020-11-14 ENCOUNTER — Other Ambulatory Visit: Payer: Self-pay | Admitting: Orthopaedic Surgery

## 2020-11-17 ENCOUNTER — Ambulatory Visit
Admission: RE | Admit: 2020-11-17 | Discharge: 2020-11-17 | Disposition: A | Discharge status: Home / Self Care | Payer: Medicare Other | Source: Ambulatory Visit | Attending: Orthopaedic Surgery | Admitting: Orthopaedic Surgery

## 2020-11-17 ENCOUNTER — Other Ambulatory Visit: Payer: Self-pay

## 2020-11-17 DIAGNOSIS — G8929 Other chronic pain: Secondary | ICD-10-CM

## 2020-11-17 DIAGNOSIS — M25562 Pain in left knee: Secondary | ICD-10-CM

## 2020-11-17 IMAGING — MR MR KNEE*L* W/O CM
4 of 6 series · 23 of 40 positions shown · non-contrast
Comparison: X-ray knee [DATE].

CLINICAL DATA: Left knee pain with weakness for 1 year and has
increased for 6 months. Patient reports previous injection 6 weeks
ago with no relief. No history of surgery reported.
Meniscal/ligament injury suspected.

EXAM:
MRI OF THE LEFT KNEE WITHOUT CONTRAST
TECHNIQUE: Multiplanar, multisequence MR imaging of the knee was performed. No
intravenous contrast was administered.

[Series 4: T2 fat-sat · coronal · 4.0mm · 0.59mm/px · 6 of 22 slices shown (1 of 2)]
[im 1/22]
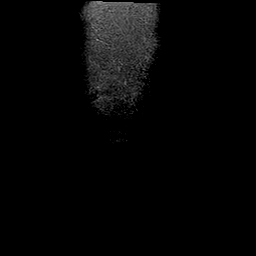
[im 5/22]
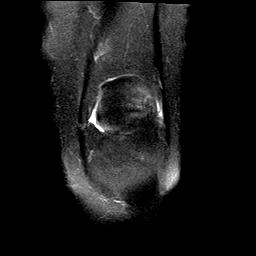
[im 9/22]
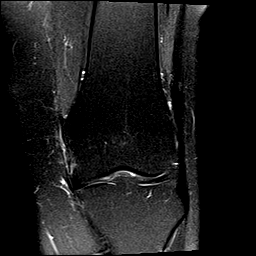
[im 13/22]
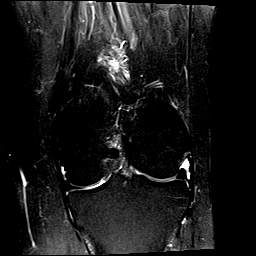
[im 17/22]
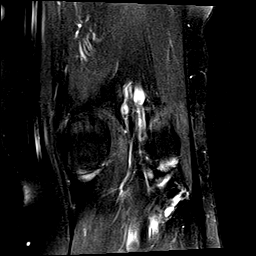
[im 22/22]
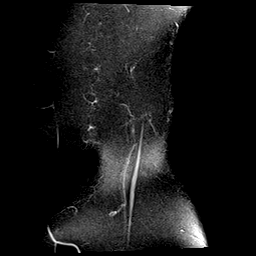

[Series 5: T1 · coronal · 4.0mm · 0.29mm/px · 3 of 22 slices shown]
[im 5/22]
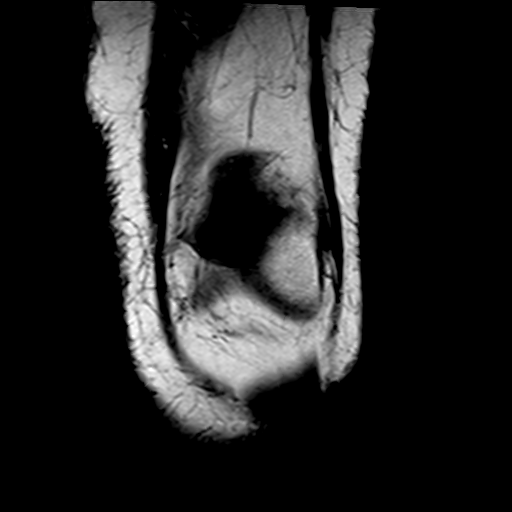
[im 13/22]
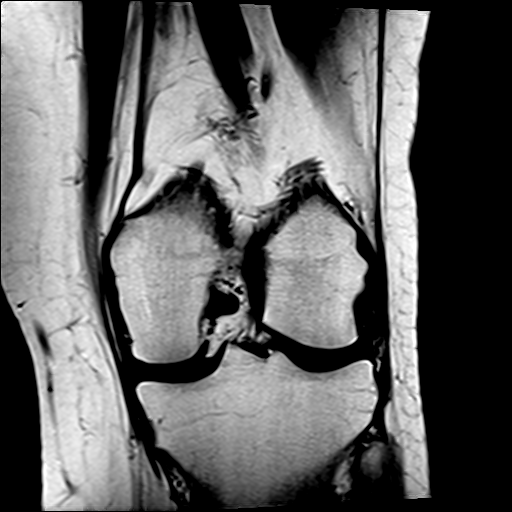
[im 22/22]
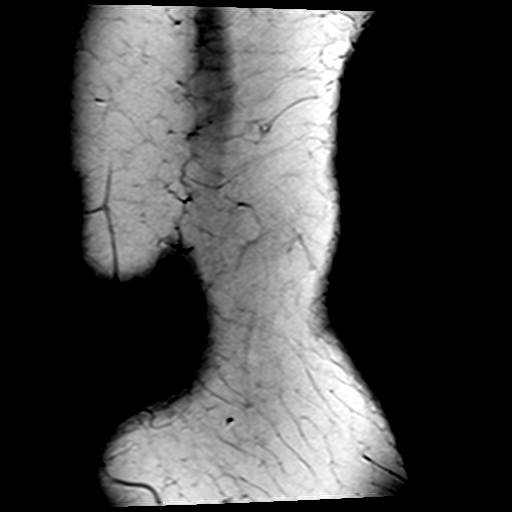

[Series 7: PD fat-sat · sagittal · 3.0mm · 0.29mm/px · 7 of 26 slices shown]
[im 1/26]
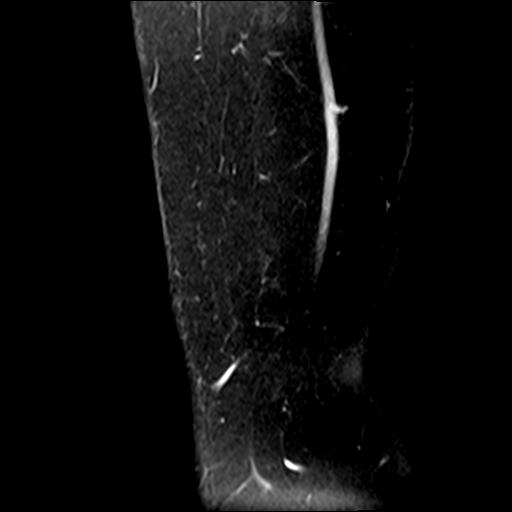
[im 5/26]
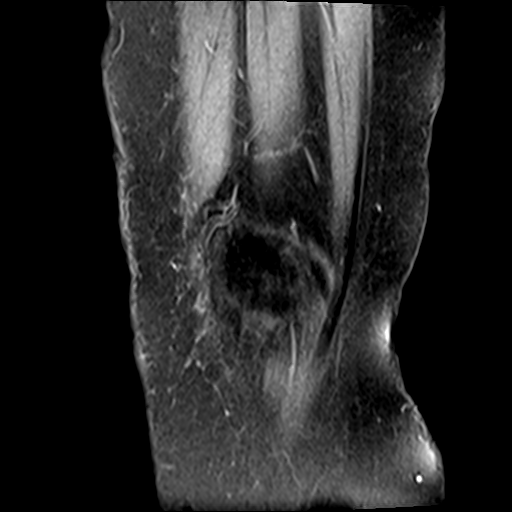
[im 9/26]
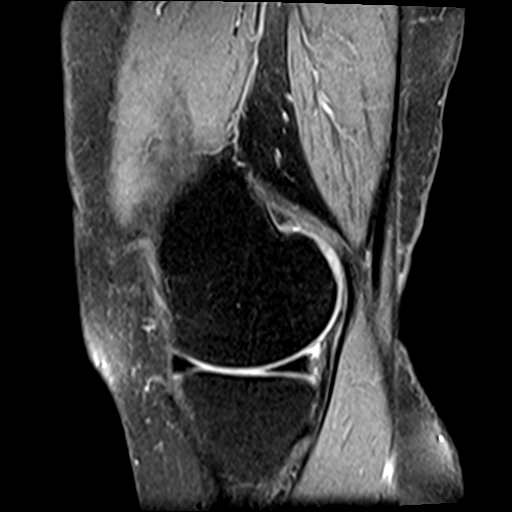
[im 13/26]
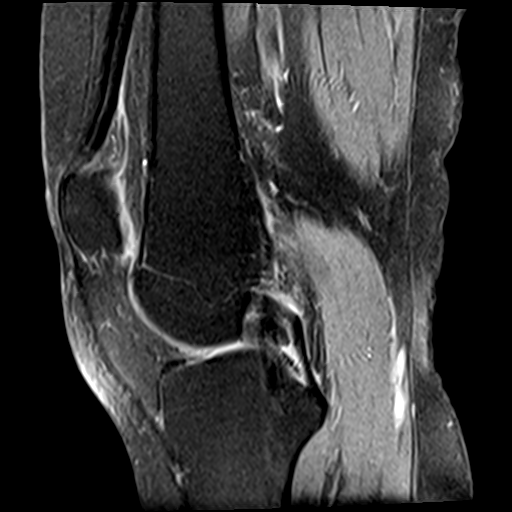
[im 17/26]
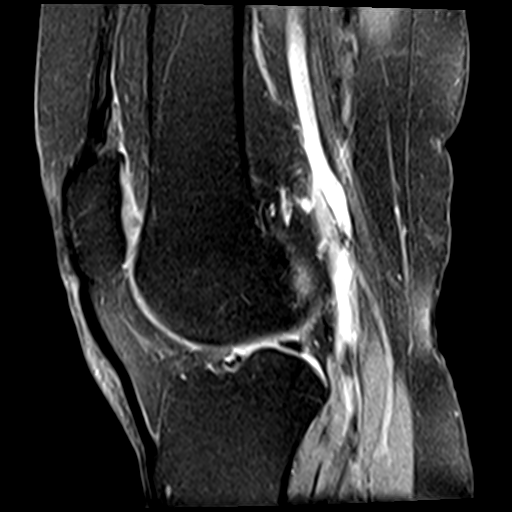
[im 21/26]
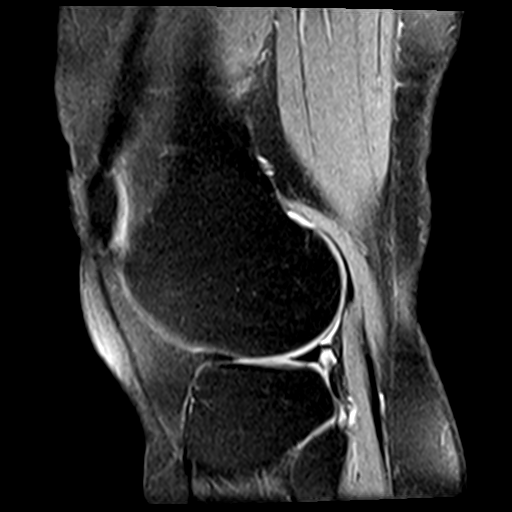
[im 26/26]
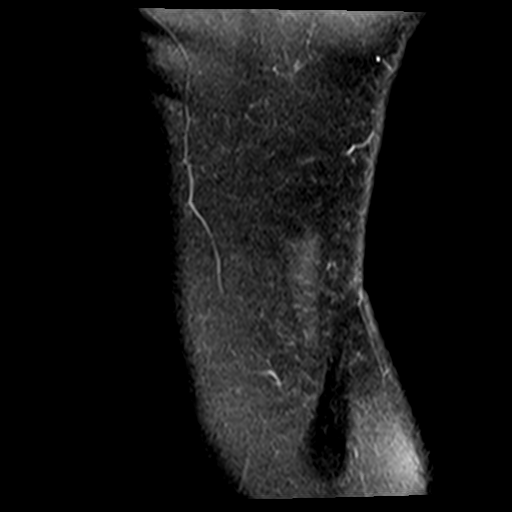

[Series 8: T2 fat-sat · sagittal · 3.0mm · 0.29mm/px · 7 of 26 slices shown (2 of 2)]
[im 1/26]
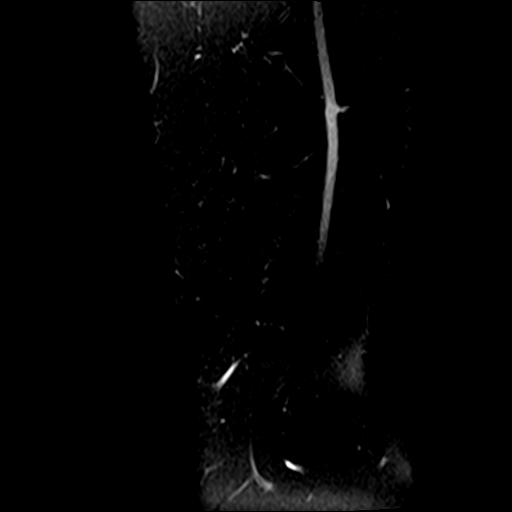
[im 5/26]
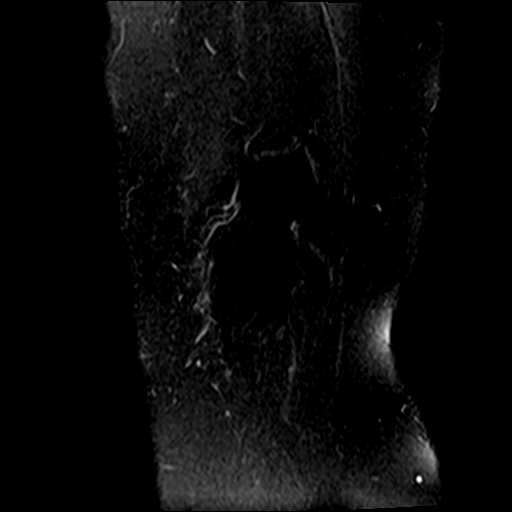
[im 9/26]
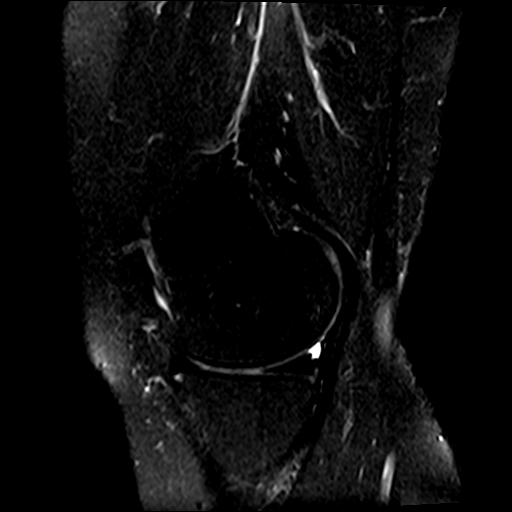
[im 13/26]
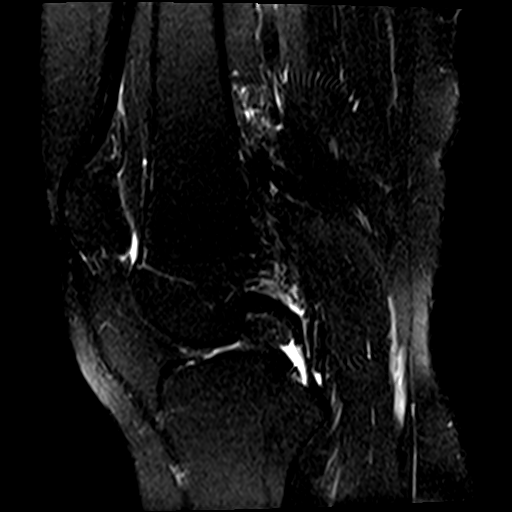
[im 17/26]
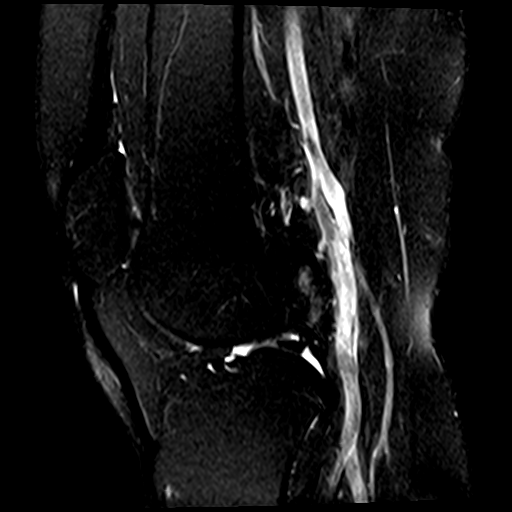
[im 21/26]
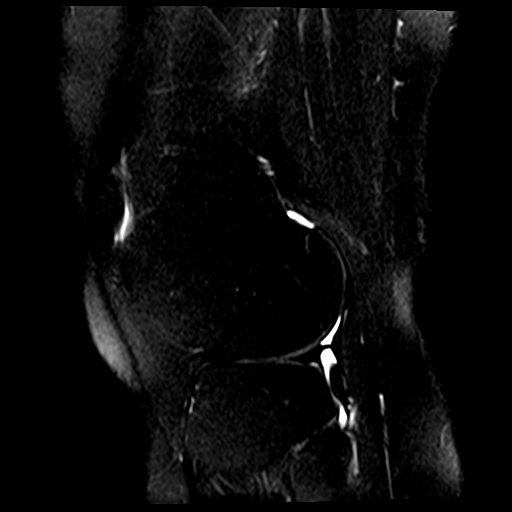
[im 26/26]
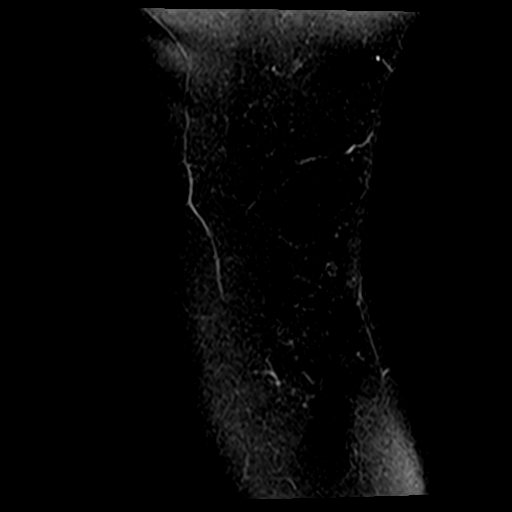

[23 of 40 positions shown; findings below may reference images not displayed]

FINDINGS: MENISCI

Medial meniscus:  Intact.

Lateral meniscus:  Intact.

LIGAMENTS

Cruciates:  Intact ACL and PCL.

Collaterals: Medial collateral ligament is intact. Lateral
collateral ligament complex is intact.

CARTILAGE

Patellofemoral: Mild chondral thinning and surface irregularity of
the lateral patellar facet without focal defect.

Medial:  No chondral defect.

Lateral:  No chondral defect.

Joint:  No joint effusion.  Unremarkable fat pads.

Popliteal Fossa:  No Baker cyst. Intact popliteus tendon.

Extensor Mechanism:  Intact quadriceps tendon and patellar tendon.

Bones: No focal marrow signal abnormality. No fracture or
dislocation.

Other: None.
IMPRESSION: 1. No internal derangement of the left knee.
2. Mild chondral thinning and surface irregularity of the lateral
patellar facet without focal defect.

## 2020-11-19 ENCOUNTER — Telehealth (HOSPITAL_COMMUNITY): Payer: Self-pay | Admitting: *Deleted

## 2020-11-19 ENCOUNTER — Ambulatory Visit: Payer: Medicare Other | Admitting: Orthopaedic Surgery

## 2020-11-19 NOTE — Telephone Encounter (Signed)
Pharmacy faxed a request for her Adderall to be renewed. She should be out in the next few day. Will bring the request to [facility] attention.

## 2020-11-20 ENCOUNTER — Other Ambulatory Visit: Payer: Self-pay | Admitting: Family Medicine

## 2020-11-20 ENCOUNTER — Other Ambulatory Visit: Payer: Self-pay | Admitting: Orthopaedic Surgery

## 2020-11-20 ENCOUNTER — Encounter: Payer: Self-pay | Admitting: Orthopaedic Surgery

## 2020-11-20 ENCOUNTER — Other Ambulatory Visit (HOSPITAL_COMMUNITY): Payer: Self-pay | Admitting: Physician Assistant

## 2020-11-20 DIAGNOSIS — F909 Attention-deficit hyperactivity disorder, unspecified type: Secondary | ICD-10-CM

## 2020-11-20 MED ORDER — AMPHETAMINE-DEXTROAMPHETAMINE 10 MG PO TABS: 10.0000 mg | ORAL_TABLET | Freq: Every day | ORAL | 0 refills | Status: DC

## 2020-11-20 NOTE — Telephone Encounter (Signed)
Provider was contacted [employer]. Reola Calkins, RN to discuss refilling patient's Adderall prescription.  Patient's medication e-prescribed to pharmacy of choice.

## 2020-11-20 NOTE — Progress Notes (Signed)
Provider was contacted [employer]. Reola Calkins, RN to discuss refilling patient's Adderall prescription. Patient has health insurance through [employer]. Provider was informed that her Adderall 10 mg XR is not covered through [facility]. Provider to prescribe Adderall 10 mg daily 30 days from the last refill date. Patient's medication e-prescribed to pharmacy of choice.

## 2020-11-26 ENCOUNTER — Other Ambulatory Visit: Payer: Self-pay | Admitting: Orthopaedic Surgery

## 2020-11-27 ENCOUNTER — Other Ambulatory Visit: Payer: Self-pay | Admitting: Orthopaedic Surgery

## 2020-11-27 ENCOUNTER — Encounter: Payer: Self-pay | Admitting: Orthopaedic Surgery

## 2020-11-27 MED ORDER — HYDROCODONE-ACETAMINOPHEN 5-325 MG PO TABS: 1.0000 | ORAL_TABLET | Freq: Four times a day (QID) | ORAL | 0 refills | Status: DC | PRN

## 2020-12-02 ENCOUNTER — Ambulatory Visit: Payer: Medicare Other | Admitting: Orthopaedic Surgery

## 2020-12-02 ENCOUNTER — Other Ambulatory Visit: Payer: Self-pay | Admitting: Orthopaedic Surgery

## 2020-12-08 ENCOUNTER — Other Ambulatory Visit: Payer: Self-pay | Admitting: Orthopaedic Surgery

## 2020-12-14 ENCOUNTER — Other Ambulatory Visit: Payer: Self-pay | Admitting: Orthopaedic Surgery

## 2020-12-15 ENCOUNTER — Encounter: Payer: Self-pay | Admitting: Orthopaedic Surgery

## 2020-12-16 ENCOUNTER — Telehealth (INDEPENDENT_AMBULATORY_CARE_PROVIDER_SITE_OTHER): Payer: Medicare Other | Admitting: Physician Assistant

## 2020-12-16 ENCOUNTER — Encounter (HOSPITAL_COMMUNITY): Payer: Self-pay | Admitting: Physician Assistant

## 2020-12-16 DIAGNOSIS — F411 Generalized anxiety disorder: Secondary | ICD-10-CM | POA: Diagnosis not present

## 2020-12-16 DIAGNOSIS — F3160 Bipolar disorder, current episode mixed, unspecified: Secondary | ICD-10-CM | POA: Diagnosis not present

## 2020-12-16 DIAGNOSIS — F909 Attention-deficit hyperactivity disorder, unspecified type: Secondary | ICD-10-CM

## 2020-12-16 MED ORDER — AMPHETAMINE-DEXTROAMPHETAMINE 10 MG PO TABS: 10.0000 mg | ORAL_TABLET | Freq: Every day | ORAL | 0 refills | Status: DC

## 2020-12-16 MED ORDER — ZIPRASIDONE HCL 40 MG PO CAPS: 40.0000 mg | ORAL_CAPSULE | Freq: Two times a day (BID) | ORAL | 1 refills | Status: DC

## 2020-12-16 MED ORDER — FLUOXETINE HCL 40 MG PO CAPS: 80.0000 mg | ORAL_CAPSULE | Freq: Every day | ORAL | 1 refills | Status: DC

## 2020-12-16 NOTE — Progress Notes (Addendum)
[place] MD/PA/NP OP Progress Note  Virtual Visit via Video Note  I connected with Jodi Gilmore on 12/16/20 at  4:30 PM EST by a video enabled telemedicine application and verified that I am speaking with the correct person using two identifiers.  Location: Patient: Home Provider: Clinic   I discussed the limitations of evaluation and management by telemedicine and the availability of in person appointments. The patient expressed understanding and agreed to proceed.  Follow Up Instructions:  I discussed the assessment and treatment plan with the patient. The patient was provided an opportunity to ask questions and all were answered. The patient agreed with the plan and demonstrated an understanding of the instructions.   The patient was advised to call back or seek an in-person evaluation if the symptoms worsen or if the condition fails to improve as anticipated.  I provided 22 minutes of non-face-to-face time during this encounter.  Malachy Mood, PA    12/16/2020 5:57 PM Jodi Gilmore  MRN:  [id number]  Chief Complaint: Follow up and medication management  HPI:   Jodi Gilmore "[name]" is a 65 year old female with a past psychiatric history significant for poorly disorder, ADHD, generalized anxiety disorder, and social anxiety who presents to [facility] via virtual telephone visit for follow-up and medication management.  Patient is currently being managed on the following medications:  Adderall XR 10 mg 24-hour capsule daily Fluoxetine 60 mg daily Ziprasidone 40 mg 2 times daily  Patient presents today with worsening depression.  Patient's worsening depression is characterized by the following depressive symptoms: no energy, lack of motivation, and overwhelming lethargy.  She states that she is also having issues with her sleep and has been eating poorly stating that she rarely cooks herself a meal and often ends up eating a microwavable  meal.  Patient is unsure of the cause to her depressive symptoms but states that she has been recently weaning herself off her pain medications she was taking for the management of her hip pain.  Patient states that she has been following her providers instruction on how to safely taper and discontinue off her pain medication.  Patient states that her anxiety is minimal when in the house.  Patient rates her anxiety at 3 out of 10 when at home but states that it elevates to a 10 out of 10 when going out.  She describes herself feeling physically ill when having to make it in person to a doctor's appointment.  She also states that she has worsening anxiety when placed in a situation she is not familiar with.  When her anxiety is at its worst, she states that her anxiety is often accompanied by nausea and diarrhea.  Sides for mental health, patient's main stressor revolves around her ability to acquire a new job.  Patient is alert and oriented x4, calm, cooperative, and fully engaged in conversation during the encounter.  Patient reports that she is feeling pretty low and is stressed due to realizing that she needs to get a job soon.  Patient denies suicidal or homicidal ideations.  She further denies auditory or visual hallucinations and does not appear to be responding to internal/external stimuli.  Patient endorses disorganized sleeping patterns stating that she sleeps 14 out of 24 hours of the day.  She reports that her sleep is broken throughout the day.  Patient endorses good appetite and eats on average 3-4 meals per day.  Patient denies alcohol consumption, tobacco use, and illicit drug use.  Visit Diagnosis:    ICD-10-CM   1. Bipolar affective disorder, current episode mixed, current episode severity unspecified ([place])  F31.60 ziprasidone (GEODON) 40 MG capsule    FLUoxetine (PROZAC) 40 MG capsule    2. Generalized anxiety disorder  F41.1 FLUoxetine (PROZAC) 40 MG capsule    3. Attention deficit  hyperactivity disorder (ADHD), unspecified ADHD type  F90.9       Past Psychiatric History:  Bipolar Disorder ADHD Generalized anxiety disorder Social anxiety  Past Medical History:  Past Medical History:  Diagnosis Date   Allergy    Anxiety    situational    Asthma    yeras ago - not current    Behcet's disease ([place])    Bell's palsy    Cataract    both removed    Depression    situational -    Diplopia    Elevated blood pressure reading    no BP meds currently    Family history of adverse reaction to anesthesia    GERD (gastroesophageal reflux disease)    Graves disease 1991   Heart murmur    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    Hip pain    HLA B27 (HLA B27 positive)    Hyperlipidemia    Hypertension    Knee pain    Memory changes    Morphea scleroderma    Neuromuscular disorder (HCC)    Raynauds    Pneumonia    Psoriatic arthritis ([place])    seen in ears only   Raynaud's disease    Sjogren syndrome with dental involvement ([place])     Past Surgical History:  Procedure Laterality Date   CYST REMOVAL HAND     left arm posterior   laproscopy     SALPINGECTOMY  1980   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   Procedure: LEFT TOTAL HIP ARTHROPLASTY ANTERIOR APPROACH;  Surgeon: Mcarthur Rossetti, MD;  Location: [facility];  Service: Orthopedics;  Laterality: Left;   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      Family Psychiatric History:  Nephew - Schizophrenia Father - patient suspects that her father may be bipolar  Family History:  Family History  Problem Relation Age of Onset   Heart disease Mother    Congestive Heart Failure Mother    Dementia Mother    Heart disease Father    Heart attack Father    Dementia Sister    Hemachromatosis Sister    Parkinson's disease Sister    Allergies Son    Healthy Son    Colon cancer Neg Hx    Colon polyps Neg Hx    Esophageal cancer Neg Hx    Stomach cancer Neg Hx    Rectal  cancer Neg Hx     Social History:  Social History   Socioeconomic History   Marital status: Single    Spouse name: Not on file   Number of children: Not on file   Years of education: Not on file   Highest education level: Not on file  Occupational History   Not on file  Tobacco Use   Smoking status: Former   Smokeless tobacco: Never  Vaping Use   Vaping Use: Never used  Substance and Sexual Activity   Alcohol use: Not Currently   Drug use: Never   Sexual activity: Not on file  Other Topics Concern   Not on file  Social History Narrative   Not on file  Social Determinants of Health   Financial Resource Strain: Not on file  Food Insecurity: Not on file  Transportation Needs: Not on file  Physical Activity: Not on file  Stress: Not on file  Social Connections: Not on file    Allergies:  Allergies  Allergen Reactions   Contrast Media [Iodinated Diagnostic Agents] Anaphylaxis   Prohance [Gadoteridol] Anaphylaxis   Ptu [Propylthiouracil]    Sulfa Antibiotics     Unknown reaction, family history   Tapazole [Methimazole]    Latex Hives and Rash    Metabolic Disorder Labs: Lab Results  Component Value Date   HGBA1C 5.2 01/01/2020   No results found for: PROLACTIN Lab Results  Component Value Date   CHOL 272 (H) 02/12/2020   TRIG 145 02/12/2020   HDL 71 02/12/2020   CHOLHDL 3.8 02/12/2020   LDLCALC 175 (H) 02/12/2020   Lab Results  Component Value Date   TSH 0.090 (L) 09/25/2020   TSH 0.01 (L) 09/01/2020    Therapeutic Level Labs: No results found for: LITHIUM No results found for: VALPROATE No components found for:  CBMZ  Current Medications: Current Outpatient Medications  Medication Sig Dispense Refill   acetaminophen (TYLENOL) 325 MG tablet Take 650 mg by mouth every 6 (six) hours as needed for moderate pain.     amphetamine-dextroamphetamine (ADDERALL) 10 MG tablet Take 1 tablet (10 mg total) by mouth daily. 30 tablet 0   aspirin 81 MG  chewable tablet Chew 1 tablet (81 mg total) by mouth 2 (two) times daily. 60 tablet 0   aspirin-acetaminophen-caffeine (EXCEDRIN MIGRAINE) 250-250-65 MG tablet Take 2 tablets by mouth every 6 (six) hours as needed for headache.     atorvastatin (LIPITOR) 40 MG tablet Take 1 tablet (40 mg total) by mouth daily. 90 tablet 3   B Complex Vitamins (VITAMIN B COMPLEX PO) Take 1 tablet by mouth daily.     Bacillus Coagulans-Inulin (PROBIOTIC-PREBIOTIC PO) Take 1 capsule by mouth daily.     cetirizine (ZYRTEC ALLERGY) 10 MG tablet Take 1 tablet (10 mg total) by mouth daily. 90 tablet 1   Cholecalciferol (VITAMIN D) 125 MCG (5000 UT) CAPS Take 10,000 Units by mouth daily.     diclofenac Sodium (VOLTAREN) 1 % GEL Apply 4 g topically 4 (four) times daily. (Patient taking differently: Apply 4 g topically daily as needed (pain).) 150 g 4   FEVERFEW PO Take 1 tablet by mouth 3 (three) times daily.     FLUoxetine (PROZAC) 40 MG capsule Take 2 capsules (80 mg total) by mouth daily. 60 capsule 1   fluticasone (FLONASE) 50 MCG/ACT nasal spray USE TWO SPRAYS IN EACH NOSTRIL ONE TIME DAILY 16 mL 6   glucosamine-chondroitin 500-400 MG tablet Take 1 tablet by mouth 3 (three) times daily.     HYDROcodone-acetaminophen (NORCO/VICODIN) 5-325 MG tablet TAKE ONE TO TWO TABLETS BY MOUTH EVERY 6 HOURS AS NEEDED FOR MODERATE PAIN 30 tablet 0   hydroxypropyl methylcellulose / hypromellose (ISOPTO TEARS / GONIOVISC) 2.5 % ophthalmic solution Place 1 drop into both eyes 3 (three) times daily as needed for dry eyes.     levothyroxine (SYNTHROID) 88 MCG tablet TAKE ONE TABLET BY MOUTH EVERY MORNING 30 MINUTES BEFORE FOOD 60 tablet 0   lisinopril (ZESTRIL) 2.5 MG tablet Take 1 tablet (2.5 mg total) by mouth at bedtime. 90 tablet 3   Magnesium Oxide (MAG-OX 400 PO) Take 400 mg by mouth in the morning and at bedtime.     Melatonin 1 MG  SUBL      methocarbamol (ROBAXIN) 500 MG tablet Take 1 tablet (500 mg total) by mouth every 6 (six)  hours as needed for muscle spasms. 40 tablet 1   Multiple Vitamin (MULTIVITAMIN) tablet Take 1 tablet by mouth daily.     Omega-3 Fatty Acids (OMEGA-3 EPA FISH OIL PO) Take 3 capsules by mouth daily.     omeprazole (PRILOSEC) 20 MG capsule TAKE ONE CAPSULE BY MOUTH IN THE MORNING AND AT BEDTIME 90 capsule 0   ondansetron (ZOFRAN) 4 MG tablet Take 1 tablet (4 mg total) by mouth every 6 (six) hours as needed for nausea. 20 tablet 0   OVER THE COUNTER MEDICATION Take 4 capsules by mouth at bedtime. Calm Forte     oxyCODONE (OXY IR/ROXICODONE) 5 MG immediate release tablet TAKE ONE TO TWO TABLETS BY MOUTH EVERY 6 HOURS AS NEEDED FOR MODERATE PAIN (4 OUT OF 6) 30 tablet 0   Potassium Bicarbonate 99 MG CAPS Take 99 mg by mouth 3 (three) times daily.     RESTASIS 0.05 % ophthalmic emulsion INSTILL ONE DROP INTO EACH EYE TWICE DAILY 60 each 3   rizatriptan (MAXALT) 10 MG tablet Take 1 tablet (10 mg total) by mouth as needed for migraine. May repeat in 2 hours if needed 10 tablet 0   rizatriptan (MAXALT-MLT) 10 MG disintegrating tablet Take 1 tablet (10 mg total) by mouth as needed for migraine. May repeat in 2 hours if needed (Patient not taking: No sig reported) 10 tablet 0   traMADol (ULTRAM) 50 MG tablet TAKE ONE TABLET BY MOUTH EVERY 6 HOURS AS NEEDED 30 tablet 0   TURMERIC PO Take 1 capsule by mouth in the morning, at noon, and at bedtime.     ziprasidone (GEODON) 40 MG capsule Take 1 capsule (40 mg total) by mouth 2 (two) times daily with a meal. 60 capsule 1   No current facility-administered medications for this visit.     Musculoskeletal: Strength & Muscle Tone: Unable to assess due to telemedicine visit [place]: Unable to assess due to telemedicine visit Patient leans: Unable to assess due to telemedicine visit  Psychiatric Specialty Exam: Review of Systems  Psychiatric/Behavioral:  Positive for sleep disturbance. Negative for decreased concentration, dysphoric mood,  hallucinations, self-injury and suicidal ideas. The patient is nervous/anxious. The patient is not hyperactive.    There were no vitals taken for this visit.There is no height or weight on file to calculate BMI.  General Appearance: Neat  Eye Contact:  Good  Speech:  Clear and Coherent and Normal Rate  Volume:  Normal  Mood:  Anxious, Depressed, and Irritable  Affect:  Congruent and Depressed  Thought Process:  Coherent, Goal Directed, and Descriptions of Associations: Intact  Orientation:  Full (Time, Place, and Person)  Thought Content: WDL   Suicidal Thoughts:  No  Homicidal Thoughts:  No  Memory:  Immediate;   Fair Recent;   Fair Remote;   Fair  Judgement:  Good  Insight:  Fair  Psychomotor Activity:  Normal  Concentration:  Concentration: Good and Attention Span: Good  Recall:  Good  Fund of Knowledge: Good  Language: Good  Akathisia:  NA  Handed:  Right  AIMS (if indicated): not done  Assets:  Communication Skills Desire for Improvement Housing  ADL's:  Intact  Cognition: WNL  Sleep:  Fair   Screenings: GAD-7    Flowsheet Row Video Visit from 12/16/2020 in [facility] Video Visit from  10/22/2020 in [facility] Video Visit from 08/28/2020 in [facility] Video Visit from 07/15/2020 in [facility] Video Visit from 06/04/2020 in [facility]  Total GAD-7 Score _0 EVO3-5    Flowsheet Row Video Visit from 12/16/2020 in [facility] Video Visit from 10/22/2020 in [facility] Office Visit from 09/25/2020 in [place] Video Visit from 08/28/2020 in [facility] Video Visit from 07/15/2020 in [place]  PHQ-2 Total Score _1 PHQ-9 Total Score _2 Flowsheet Row Video  Visit from 12/16/2020 in [facility] Video Visit from 10/22/2020 in [facility] Admission (Discharged) from 10/17/2020 in [place] Low Risk No Risk No Risk        Assessment and Plan:   Jaianna Nicoll "[name]" is a 65 year old female with a past psychiatric history significant for poorly disorder, ADHD, generalized anxiety disorder, and social anxiety who presents to [facility] via virtual telephone visit for follow-up and medication management.  Patient reports worsening depression with the following symptoms: no energy, lethargy, and lack of motivation.  Patient states that her anxiety is minimal when at the house but when placed in a situation unfamiliar to her or when going out in public, her anxiety is at a peak level.  Provider recommended adjusting her Prozac dosage from 2 mg to 80 mg daily for the management of her anxiety and depressive symptoms.  Patient's insurance, she is to be placed on Adderall 10 mg daily for the management of her ADHD symptoms.  Patient's medications to be e-prescribed to pharmacy of choice.  Prior to concluding the encounter, provider went over mood stabilizing agents that the patient has been on in the past.  Patient has not been on Abilify, Seroquel, olanzapine, Vraylar, or Caplyta.  She reports that her son has been on Abilify and olanzapine in the past but found that the medications were not helpful in the management of his symptoms.  1. Bipolar affective disorder, current episode mixed, current episode severity unspecified ([place])  - ziprasidone (GEODON) 40 MG capsule; Take 1 capsule (40 mg total) by mouth 2 (two) times daily with a meal.  Dispense: 60 capsule; Refill: 1 - FLUoxetine (PROZAC) 40 MG capsule; Take 2 capsules (80 mg total) by mouth daily.  Dispense: 60 capsule; Refill: 1  2. Generalized anxiety disorder  -  FLUoxetine (PROZAC) 40 MG capsule; Take 2 capsules (80 mg total) by mouth daily.  Dispense: 60 capsule; Refill: 1  3. Attention deficit hyperactivity disorder (ADHD), unspecified ADHD type  - amphetamine-dextroamphetamine (ADDERALL) 10 MG tablet; Take 1 tablet (10 mg total) by mouth daily.  Dispense: 30 tablet; Refill: 0  Patient to follow up in 2 months Provider spent a total of 22 minutes with the patient/reviewing the patient's chart  Malachy Mood, PA 12/16/2020, 5:57 PM

## 2020-12-17 ENCOUNTER — Ambulatory Visit: Payer: Medicare Other | Admitting: Family Medicine

## 2020-12-17 ENCOUNTER — Other Ambulatory Visit: Payer: Self-pay | Admitting: Family Medicine

## 2020-12-19 ENCOUNTER — Other Ambulatory Visit: Payer: Self-pay | Admitting: Orthopaedic Surgery

## 2020-12-25 ENCOUNTER — Other Ambulatory Visit: Payer: Self-pay | Admitting: Orthopaedic Surgery

## 2020-12-31 ENCOUNTER — Other Ambulatory Visit: Payer: Self-pay | Admitting: Orthopaedic Surgery

## 2021-01-06 ENCOUNTER — Encounter: Payer: Self-pay | Admitting: Family Medicine

## 2021-01-08 ENCOUNTER — Other Ambulatory Visit: Payer: Self-pay | Admitting: Orthopaedic Surgery

## 2021-01-11 ENCOUNTER — Other Ambulatory Visit: Payer: Self-pay | Admitting: Family Medicine

## 2021-01-13 ENCOUNTER — Other Ambulatory Visit: Payer: Self-pay | Admitting: Family Medicine

## 2021-01-13 DIAGNOSIS — E039 Hypothyroidism, unspecified: Secondary | ICD-10-CM

## 2021-01-13 NOTE — Progress Notes (Signed)
TSH for recheck after lowering thyroid dose to 88 mcg.  Fayette Pho, MD

## 2021-01-14 ENCOUNTER — Telehealth: Payer: Self-pay

## 2021-01-14 NOTE — Telephone Encounter (Signed)
Patient requests Hydrocodone refill °[facility]  °

## 2021-01-15 ENCOUNTER — Telehealth: Payer: Self-pay

## 2021-01-15 ENCOUNTER — Other Ambulatory Visit: Payer: Self-pay | Admitting: Physician Assistant

## 2021-01-15 ENCOUNTER — Other Ambulatory Visit: Payer: Self-pay | Admitting: Orthopaedic Surgery

## 2021-01-15 MED ORDER — HYDROCODONE-ACETAMINOPHEN 5-325 MG PO TABS: 1.0000 | ORAL_TABLET | Freq: Three times a day (TID) | ORAL | 0 refills | Status: DC | PRN

## 2021-01-15 NOTE — Telephone Encounter (Signed)
Patient requests Hydrocodone refill [facility]

## 2021-01-15 NOTE — Telephone Encounter (Signed)
Patient aware of the below message  

## 2021-01-16 ENCOUNTER — Encounter: Payer: Self-pay | Admitting: Family Medicine

## 2021-01-16 DIAGNOSIS — E039 Hypothyroidism, unspecified: Secondary | ICD-10-CM

## 2021-01-16 NOTE — Progress Notes (Signed)
Patient returns call to nurse line. Patient is requesting TSH, T3 and T4 to be drawn at lab visit on Monday.   Please place future orders for T3 and T4 if appropriate.   Veronda Prude, RN

## 2021-01-19 ENCOUNTER — Other Ambulatory Visit: Payer: Medicare Other

## 2021-01-20 ENCOUNTER — Telehealth (HOSPITAL_COMMUNITY): Payer: Self-pay | Admitting: *Deleted

## 2021-01-20 NOTE — Telephone Encounter (Signed)
Pharmacy request for patients Dextroamphetamine , last written on 12/22/20. She has a future appt here with Eddie on 02/19/21. She should be out this week of her meds. Will forward request to her provider.

## 2021-01-22 ENCOUNTER — Other Ambulatory Visit (HOSPITAL_COMMUNITY): Payer: Self-pay | Admitting: Physician Assistant

## 2021-01-22 ENCOUNTER — Telehealth (HOSPITAL_COMMUNITY): Payer: Self-pay | Admitting: *Deleted

## 2021-01-22 DIAGNOSIS — F909 Attention-deficit hyperactivity disorder, unspecified type: Secondary | ICD-10-CM

## 2021-01-22 MED ORDER — AMPHETAMINE-DEXTROAMPHETAMINE 10 MG PO TABS: 10.0000 mg | ORAL_TABLET | Freq: Every day | ORAL | 0 refills | Status: DC

## 2021-01-22 NOTE — Telephone Encounter (Signed)
Message acknowledge and received.

## 2021-01-22 NOTE — Telephone Encounter (Signed)
Second pharmacy request for patients Desxtroamphetamine 10 mg she takes daily. Will make her provider aware of the need, and she should in fact be out.

## 2021-01-22 NOTE — Progress Notes (Signed)
Provider was contacted by [place]. Reola Calkins, RN regarding patient's medication. Patient's medication e-prescribed to pharmacy of choice.

## 2021-01-22 NOTE — Addendum Note (Signed)
Addended by: Jennette Bill on: 01/22/2021 08:53 AM   Modules accepted: Orders

## 2021-01-22 NOTE — Telephone Encounter (Signed)
Provider was contacted by Orpah [place]. Reola Calkins, RN about patient's medication. Patient's medication was sent to pharmacy of choice.

## 2021-01-27 ENCOUNTER — Other Ambulatory Visit: Payer: Medicare Other

## 2021-02-03 ENCOUNTER — Other Ambulatory Visit: Payer: Medicare Other

## 2021-02-03 ENCOUNTER — Other Ambulatory Visit: Payer: Self-pay

## 2021-02-03 DIAGNOSIS — E039 Hypothyroidism, unspecified: Secondary | ICD-10-CM

## 2021-02-04 ENCOUNTER — Telehealth: Payer: Self-pay | Admitting: Family Medicine

## 2021-02-04 ENCOUNTER — Other Ambulatory Visit: Payer: Self-pay | Admitting: Family Medicine

## 2021-02-04 ENCOUNTER — Encounter: Payer: Self-pay | Admitting: Family Medicine

## 2021-02-04 ENCOUNTER — Telehealth (HOSPITAL_COMMUNITY): Payer: Self-pay | Admitting: *Deleted

## 2021-02-04 DIAGNOSIS — E039 Hypothyroidism, unspecified: Secondary | ICD-10-CM

## 2021-02-04 LAB — TSH: TSH: 25.9 u[IU]/mL — ABNORMAL HIGH (ref 0.450–4.500)

## 2021-02-04 LAB — T4, FREE: Free T4: 1.21 ng/dL (ref 0.82–1.77)

## 2021-02-04 LAB — T3: T3, Total: 72 ng/dL (ref 71–180)

## 2021-02-04 MED ORDER — LEVOTHYROXINE SODIUM 100 MCG PO TABS: 100.0000 ug | ORAL_TABLET | ORAL | 2 refills | Status: DC

## 2021-02-04 NOTE — Telephone Encounter (Signed)
Attempted to call patient regarding thyroid labs. No answer, left HIPAA safe VM. Will send MyChart message. Rx levothyroxine 100 mcg. Will need TSH recheck in one month. Given patient's preference to have T4 and T3 drawn with TSH, ordered those too.   Ezequiel Essex, MD

## 2021-02-04 NOTE — Telephone Encounter (Signed)
Called her pharmacy to follow up with request for fluoxetine. Record indicates she should have meds till Feb 4th, she has a follow up appt with Eddie PA on 02/19/21. Called pharmacy to check on reason for early request, he states she made the request via the computer and then it generates request from pharmacy. Eddie PA is away from the office this week but she should have meds till he returns in in 5 days. Will put a note in for him re this concern on his return.

## 2021-02-06 ENCOUNTER — Other Ambulatory Visit (HOSPITAL_COMMUNITY): Payer: Self-pay | Admitting: Physician Assistant

## 2021-02-06 DIAGNOSIS — F411 Generalized anxiety disorder: Secondary | ICD-10-CM

## 2021-02-06 DIAGNOSIS — F3160 Bipolar disorder, current episode mixed, unspecified: Secondary | ICD-10-CM

## 2021-02-06 MED ORDER — FLUOXETINE HCL 40 MG PO CAPS: 80.0000 mg | ORAL_CAPSULE | Freq: Every day | ORAL | 1 refills | Status: DC

## 2021-02-06 NOTE — Progress Notes (Signed)
Provider was contacted by Suzanne K. Beck, RN regarding patient's medication refill request. Patient's medication to be e-prescribed to pharmacy of choice.

## 2021-02-06 NOTE — Telephone Encounter (Signed)
Provider was contacted by Suzanne K. Beck, RN regarding patient's medication refill request. Patient's medication to be e-prescribed to pharmacy of choice.

## 2021-02-16 ENCOUNTER — Other Ambulatory Visit: Payer: Self-pay | Admitting: Family Medicine

## 2021-02-19 ENCOUNTER — Encounter (HOSPITAL_COMMUNITY): Payer: Self-pay | Admitting: Physician Assistant

## 2021-02-19 ENCOUNTER — Telehealth (INDEPENDENT_AMBULATORY_CARE_PROVIDER_SITE_OTHER): Payer: Medicare Other | Admitting: Physician Assistant

## 2021-02-19 DIAGNOSIS — F411 Generalized anxiety disorder: Secondary | ICD-10-CM | POA: Diagnosis not present

## 2021-02-19 DIAGNOSIS — F909 Attention-deficit hyperactivity disorder, unspecified type: Secondary | ICD-10-CM

## 2021-02-19 DIAGNOSIS — F3160 Bipolar disorder, current episode mixed, unspecified: Secondary | ICD-10-CM

## 2021-02-19 NOTE — Progress Notes (Signed)
[place] MD/PA/NP OP Progress Note  Virtual Visit via Video Note  I connected with Jodi Gilmore on 02/19/21 at  4:30 PM EST by a video enabled telemedicine application and verified that I am speaking with the correct person using two identifiers.  Location: Patient: Home Provider: Clinic   I discussed the limitations of evaluation and management by telemedicine and the availability of in person appointments. The patient expressed understanding and agreed to proceed.  Follow Up Instructions:  I discussed the assessment and treatment plan with the patient. The patient was provided an opportunity to ask questions and all were answered. The patient agreed with the plan and demonstrated an understanding of the instructions.   The patient was advised to call back or seek an in-person evaluation if the symptoms worsen or if the condition fails to improve as anticipated.  I provided 27 minutes of non-face-to-face time during this encounter.  Malachy Mood, PA   02/19/2021 11:27 PM Jodi Gilmore  MRN:  [id number]  Chief Complaint: Follow up and medication management  HPI:   Jodi Gilmore "[name]" is a 66 year old female with a past psychiatric history significant for bipolar disorder, generalized anxiety disorder, and attention deficit hyperactivity disorder to [facility] via virtual video visit for follow-up and medication management.  Patient is currently being managed on the following medications:  Prozac 80 mg daily Adderall XR 10 mg 24-hour capsule daily Ziprasidone 40 mg 2 times daily  Patient reports that she would like to discontinue taking her fluoxetine and ziprasidone due to experiencing no relief of her symptoms.  Patient reports that she feels that her symptoms may be attributed to her unmanaged thyroid issues.  Patient reports that she recently had lab use performed on her TSH with her TSH levels being extremely high.  Patient states that  she has been experiencing the following symptoms: fatigue, lack of motivation, and difficulty getting out of bed.  Patient states that she has also experienced upset stomach through the use of her psychiatric medications and believes that her thyroid medications have not been properly absorbed due to her upset stomach.  Patient endorses depressive episodes every day as well as elevated anxiety she rates an 8 out of 10.  Patient's biggest stressor is not being physically well and starting to neglect her activities of daily living.  A PHQ-9 screen was performed with the patient scoring an 18.  A GAD-7 screen was also performed with the patient scoring an 11.  Patient is alert and oriented x4, calm, cooperative, and fully engaged and conversation.  Patient states that she is set up and frustrated with her health.  Patient denies suicidal or homicidal ideations.  She further denies auditory or visual hallucinations and does not appear to be responding to internal/external stimuli.  Patient endorses fair sleep and receives on average 4 to 6 hours of sleep each night.  Patient endorses good appetite and eats on average 3 meals per day.  Patient denies alcohol consumption, tobacco use, and illicit drug use.  Visit Diagnosis:    ICD-10-CM   1. Bipolar affective disorder, current episode mixed, current episode severity unspecified ([place])  F31.60     2. 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    Hip pain    HLA B27 (HLA B27 positive)    Hyperlipidemia    Hypertension    Knee pain    Memory changes    Morphea scleroderma    Neuromuscular disorder ([place])    Raynauds    Pneumonia    Psoriatic arthritis ([place])    seen in ears only   Raynaud's disease    Sjogren syndrome with dental involvement ([place])     Past Surgical History:  Procedure Laterality Date   CYST REMOVAL HAND     left arm posterior   laproscopy     SALPINGECTOMY  1980   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   Procedure: LEFT TOTAL HIP ARTHROPLASTY ANTERIOR APPROACH;  Surgeon: Mcarthur Rossetti, MD;  Location: [facility];  Service: Orthopedics;  Laterality: Left;   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      Family Psychiatric History:  Nephew - Schizophrenia Father - patient suspects that her father may be bipolar  Family History:  Family History  Problem Relation Age of Onset   Heart disease Mother    Congestive Heart Failure Mother    Dementia Mother    Heart disease Father    Heart attack Father    Dementia Sister    Hemachromatosis Sister    Parkinson's disease Sister    Allergies Son    Healthy Son    Colon cancer Neg Hx    Colon polyps Neg Hx    Esophageal cancer Neg Hx    Stomach cancer Neg Hx    Rectal cancer Neg Hx     Social History:  Social History   Socioeconomic History   Marital status: Single    Spouse name: Not on file   Number of children: Not on file   Years of education: Not on file   Highest education level: Not on file  Occupational History   Not on file  Tobacco Use   Smoking status: Former   Smokeless tobacco: Never  Vaping Use   Vaping Use: Never used  Substance and Sexual Activity   Alcohol use: Not Currently   Drug use: Never   Sexual activity: Not on file  Other Topics Concern   Not on file  Social History Narrative   Not on file   Social  Determinants of Health   Financial Resource Strain: Not on file  Food Insecurity: Not on file  Transportation Needs: Not on file  Physical Activity: Not on file  Stress: Not on file  Social Connections: Not on file    Allergies:  Allergies  Allergen Reactions   Contrast Media [Iodinated Contrast Media] Anaphylaxis   Prohance [Gadoteridol] Anaphylaxis   Ptu [Propylthiouracil]    Sulfa Antibiotics     Unknown reaction, family history   Tapazole [Methimazole]    Latex Hives and Rash    Metabolic Disorder Labs: Lab Results  Component Value Date   HGBA1C 5.2 01/01/2020  No results found for: PROLACTIN Lab Results  Component Value Date   CHOL 272 (H) 02/12/2020   TRIG 145 02/12/2020   HDL 71 02/12/2020   CHOLHDL 3.8 02/12/2020   LDLCALC 175 (H) 02/12/2020   Lab Results  Component Value Date   TSH 25.900 (H) 02/03/2021   TSH 0.090 (L) 09/25/2020    Therapeutic Level Labs: No results found for: LITHIUM No results found for: VALPROATE No components found for:  CBMZ  Current Medications: Current Outpatient Medications  Medication Sig Dispense Refill   acetaminophen (TYLENOL) 325 MG tablet Take 650 mg by mouth every 6 (six) hours as needed for moderate pain.     amphetamine-dextroamphetamine (ADDERALL) 10 MG tablet Take 1 tablet (10 mg total) by mouth daily. 30 tablet 0   aspirin 81 MG chewable tablet Chew 1 tablet (81 mg total) by mouth 2 (two) times daily. 60 tablet 0   aspirin-acetaminophen-caffeine (EXCEDRIN MIGRAINE) 250-250-65 MG tablet Take 2 tablets by mouth every 6 (six) hours as needed for headache.     atorvastatin (LIPITOR) 40 MG tablet TAKE ONE TABLET BY MOUTH ONE TIME DAILY 90 tablet 3   B Complex Vitamins (VITAMIN B COMPLEX PO) Take 1 tablet by mouth daily.     Bacillus Coagulans-Inulin (PROBIOTIC-PREBIOTIC PO) Take 1 capsule by mouth daily.     cetirizine (ZYRTEC ALLERGY) 10 MG tablet Take 1 tablet (10 mg total) by mouth daily. 90 tablet 1    Cholecalciferol (VITAMIN D) 125 MCG (5000 UT) CAPS Take 10,000 Units by mouth daily.     diclofenac Sodium (VOLTAREN) 1 % GEL Apply 4 g topically 4 (four) times daily. (Patient taking differently: Apply 4 g topically daily as needed (pain).) 150 g 4   FEVERFEW PO Take 1 tablet by mouth 3 (three) times daily.     FLUoxetine (PROZAC) 40 MG capsule Take 2 capsules (80 mg total) by mouth daily. 60 capsule 1   fluticasone (FLONASE) 50 MCG/ACT nasal spray USE TWO SPRAYS IN EACH NOSTRIL ONE TIME DAILY 16 mL 6   glucosamine-chondroitin 500-400 MG tablet Take 1 tablet by mouth 3 (three) times daily.     HYDROcodone-acetaminophen (NORCO/VICODIN) 5-325 MG tablet Take 1 tablet by mouth 3 (three) times daily as needed for moderate pain. 40 tablet 0   hydroxypropyl methylcellulose / hypromellose (ISOPTO TEARS / GONIOVISC) 2.5 % ophthalmic solution Place 1 drop into both eyes 3 (three) times daily as needed for dry eyes.     levothyroxine (SYNTHROID) 100 MCG tablet Take 1 tablet (100 mcg total) by mouth every morning. 30 minutes before food. Please come back to clinic in Feb for TSH recheck! 30 tablet 2   lisinopril (ZESTRIL) 2.5 MG tablet Take 1 tablet (2.5 mg total) by mouth at bedtime. 90 tablet 3   Magnesium Oxide (MAG-OX 400 PO) Take 400 mg by mouth in the morning and at bedtime.     Melatonin 1 MG SUBL      methocarbamol (ROBAXIN) 500 MG tablet TAKE ONE TABLET BY MOUTH EVERY 6 HOURS AS NEEDED FOR MUSCLE SPASM 40 tablet 1   Multiple Vitamin (MULTIVITAMIN) tablet Take 1 tablet by mouth daily.     Omega-3 Fatty Acids (OMEGA-3 EPA FISH OIL PO) Take 3 capsules by mouth daily.     omeprazole (PRILOSEC) 20 MG capsule TAKE ONE CAPSULE BY MOUTH EVERY MORNING AND TAKE ONE CAPSULE BY MOUTH AT BEDTIME 90 capsule 0   ondansetron (ZOFRAN) 4 MG tablet Take 1 tablet (4 mg total) by mouth  every 6 (six) hours as needed for nausea. 20 tablet 0   OVER THE COUNTER MEDICATION Take 4 capsules by mouth at bedtime. Calm Forte      Potassium Bicarbonate 99 MG CAPS Take 99 mg by mouth 3 (three) times daily.     RESTASIS 0.05 % ophthalmic emulsion INSTILL ONE DROP INTO EACH EYE TWICE DAILY 60 each 3   rizatriptan (MAXALT) 10 MG tablet TAKE ONE TABLET BY MOUTH AT ONSET OF HEADACHE, MAY REPEAT DOSE WITH ONE TABLET IN TWO HOURS IF NEEDED 10 tablet 0   traMADol (ULTRAM) 50 MG tablet TAKE ONE TABLET BY MOUTH EVERY 6 HOURS AS NEEDED 30 tablet 0   TURMERIC PO Take 1 capsule by mouth in the morning, at noon, and at bedtime.     valACYclovir (VALTREX) 500 MG tablet TAKE ONE TABLET BY MOUTH TWICE A DAY FOR 7 DAYS 30 tablet 2   ziprasidone (GEODON) 40 MG capsule Take 1 capsule (40 mg total) by mouth 2 (two) times daily with a meal. 60 capsule 1   No current facility-administered medications for this visit.     Musculoskeletal: Strength & Muscle Tone: Unable to assess due to telemedicine visit [place]: Unable to assess due to telemedicine visit Patient leans: Unable to assess due to telemedicine visit  Psychiatric Specialty Exam: Review of Systems  Psychiatric/Behavioral:  Positive for decreased concentration, dysphoric mood and sleep disturbance. Negative for hallucinations, self-injury and suicidal ideas. The patient is nervous/anxious. The patient is not hyperactive.    There were no vitals taken for this visit.There is no height or weight on file to calculate BMI.  General Appearance: Casual and Fairly Groomed  Eye Contact:  Good  Speech:  Clear and Coherent and Normal Rate  Volume:  Normal  Mood:  Anxious, Depressed, and Dysphoric  Affect:  Congruent  Thought Process:  Coherent, Goal Directed, and Descriptions of Associations: Intact  Orientation:  Full (Time, Place, and Person)  Thought Content: WDL   Suicidal Thoughts:  No  Homicidal Thoughts:  No  Memory:  Immediate;   Fair Recent;   Fair Remote;   Fair  Judgement:  Good  Insight:  Fair  Psychomotor Activity:  Normal  Concentration:  Concentration: Good  and Attention Span: Good  Recall:  Good  Fund of Knowledge: Good  Language: Good  Akathisia:  NA  Handed:  Right  AIMS (if indicated): not done  Assets:  Communication Skills Desire for Improvement Housing  ADL's:  Intact  Cognition: WNL  Sleep:  Fair   Screenings: GAD-7    Flowsheet Row Video Visit from 02/19/2021 in [facility] Video Visit from 12/16/2020 in [facility] Video Visit from 10/22/2020 in [facility] Video Visit from 08/28/2020 in [facility] Video Visit from 07/15/2020 in [facility]  Total GAD-7 Score 11 8 8 7 8       PHQ2-9    Flowsheet Row Video Visit from 02/19/2021 in [facility] Video Visit from 12/16/2020 in [facility] Video Visit from 10/22/2020 in [facility] Office Visit from 09/25/2020 in [place] Video Visit from 08/28/2020 in [facility]  PHQ-2 Total Score 6 6 6 4 5   PHQ-9 Total Score 18 15 17 13 17       Flowsheet Row Video Visit from 02/19/2021 in [facility] Video  Visit from 12/16/2020 in [facility] Video Visit from 10/22/2020 in [facility] CATEGORY Low Risk Low Risk No Risk        Assessment and Plan:   Jodi Gilmore "[name]" is a 66 year old female with a past psychiatric history significant for bipolar disorder, generalized anxiety disorder, and attention deficit hyperactivity disorder to [facility] via virtual video visit for follow-up and medication management.  Patient reports that she would like to discontinue taking her Geodon and Prozac due to not experiencing any relief from her symptoms.  Patient believes that her symptoms can be  linked to her elevated TSH levels.  Provider informed patient the risk of tapering off her medications.  Patient vocalized understanding.  Patient to taper off Prozac before tapering from ziprasidone.  Prozac -patient was informed to take 40 mg for 3 days, followed by 20 mg for 3 days followed by 10 mg for 3 more days before discontinuing Prozac  Ziprasidone -after discontinuing Prozac, patient to take ziprasidone 60 mg for 7 days, followed by 40 mg for 7 days, then continue taking ziprasidone 20 mg for 7 more days before discontinuing ziprasidone.  Patient was informed to monitor her mood and disposition during her tapering series.  Patient was informed to contact 911 if she experiences any severe mental issues such as suicidal ideations, homicidal ideations, or unmanageable auditory or visual hallucinations.  Patient vocalized understanding.  Patient's medications to be e-prescribed to pharmacy of choice.  1. Bipolar affective disorder, current episode mixed, current episode severity unspecified ([facility])  - FLUoxetine (PROZAC) 20 MG capsule; Patient to take 2 tablets (40 mg total) for 3 days, then continue taking 1 tablet (20 mg total) for 3 more days.  Dispense: 9 capsule; Refill: 0 - FLUoxetine (PROZAC) 10 MG capsule; After taking Prozac 20 mg total for 3 days, patient to take 1 tablet (10 mg total) for 3 days before discontinuing  Dispense: 3 capsule; Refill: 0 - ziprasidone (GEODON) 60 MG capsule; Patient to take 1 tablet (60 mg total) for 7 days.  Dispense: 7 capsule; Refill: 0 - ziprasidone (GEODON) 40 MG capsule; Patient to take 1 tablet (40 mg total) for 7 days  Dispense: 7 capsule; Refill: 0 - ziprasidone (GEODON) 20 MG capsule; Patient to take 1 tablet (20 mg total) for 7 days before discontinuing medication  Dispense: 7 capsule; Refill: 0  2. Generalized anxiety disorder  - FLUoxetine (PROZAC) 20 MG capsule; Patient to take 2 tablets (40 mg total) for 3 days, then continue taking 1  tablet (20 mg total) for 3 more days.  Dispense: 9 capsule; Refill: 0 - FLUoxetine (PROZAC) 10 MG capsule; After taking Prozac 20 mg total for 3 days, patient to take 1 tablet (10 mg total) for 3 days before discontinuing  Dispense: 3 capsule; Refill: 0  3. Attention deficit hyperactivity disorder (ADHD), unspecified ADHD type  - amphetamine-dextroamphetamine (ADDERALL) 10 MG tablet; Take 1 tablet (10 mg total) by mouth daily.  Dispense: 30 tablet; Refill: 0  Patient to follow up in 4 weeks Provider spent a total of 27 minutes with the patient/reviewing patient's chart  Malachy Mood, PA 02/19/2021, 11:27 PM

## 2021-02-20 ENCOUNTER — Telehealth (HOSPITAL_COMMUNITY): Payer: Self-pay | Admitting: *Deleted

## 2021-02-20 MED ORDER — ZIPRASIDONE HCL 60 MG PO CAPS: ORAL_CAPSULE | ORAL | 0 refills | Status: DC

## 2021-02-20 MED ORDER — FLUOXETINE HCL 10 MG PO CAPS: ORAL_CAPSULE | ORAL | 0 refills | Status: DC

## 2021-02-20 MED ORDER — ZIPRASIDONE HCL 20 MG PO CAPS: ORAL_CAPSULE | ORAL | 0 refills | Status: DC

## 2021-02-20 MED ORDER — FLUOXETINE HCL 20 MG PO CAPS
ORAL_CAPSULE | ORAL | 0 refills | Status: DC
Start: 2021-02-20 — End: 2021-06-24

## 2021-02-20 MED ORDER — ZIPRASIDONE HCL 40 MG PO CAPS: ORAL_CAPSULE | ORAL | 0 refills | Status: DC

## 2021-02-20 MED ORDER — AMPHETAMINE-DEXTROAMPHETAMINE 10 MG PO TABS: 10.0000 mg | ORAL_TABLET | Freq: Every day | ORAL | 0 refills | Status: DC

## 2021-02-20 NOTE — Telephone Encounter (Signed)
Called patient at the request of the provider to make sure she understood she is to taper self off the Prozac and THEN taper off the Geodon once she is off the prozac. Also, reviewed the taper schedule and she expressed her understanding. She has a follow up appt on 03/20/21. She is to call back if she needs further assistance.

## 2021-02-22 ENCOUNTER — Other Ambulatory Visit: Payer: Self-pay | Admitting: Family Medicine

## 2021-02-25 NOTE — Telephone Encounter (Signed)
Message acknowledged and reviewed.

## 2021-03-02 ENCOUNTER — Other Ambulatory Visit: Payer: Self-pay

## 2021-03-02 MED ORDER — METHOCARBAMOL 500 MG PO TABS: ORAL_TABLET | ORAL | 1 refills | Status: DC

## 2021-03-06 ENCOUNTER — Other Ambulatory Visit: Payer: Medicare Other

## 2021-03-06 ENCOUNTER — Other Ambulatory Visit: Payer: Self-pay

## 2021-03-06 MED ORDER — METHOCARBAMOL 500 MG PO TABS: ORAL_TABLET | ORAL | 1 refills | Status: DC

## 2021-03-07 ENCOUNTER — Encounter: Payer: Self-pay | Admitting: Family Medicine

## 2021-03-08 ENCOUNTER — Other Ambulatory Visit: Payer: Self-pay | Admitting: Family Medicine

## 2021-03-08 DIAGNOSIS — R7309 Other abnormal glucose: Secondary | ICD-10-CM

## 2021-03-08 DIAGNOSIS — I10 Essential (primary) hypertension: Secondary | ICD-10-CM

## 2021-03-08 DIAGNOSIS — E782 Mixed hyperlipidemia: Secondary | ICD-10-CM

## 2021-03-08 DIAGNOSIS — Z131 Encounter for screening for diabetes mellitus: Secondary | ICD-10-CM

## 2021-03-08 NOTE — Progress Notes (Signed)
Patient requests non fasting glucose checks with her TSH check on 03/11/21. No previous diagnosis of diabetes.  Fayette Pho, MD

## 2021-03-11 ENCOUNTER — Other Ambulatory Visit: Payer: Self-pay

## 2021-03-11 ENCOUNTER — Other Ambulatory Visit: Payer: Medicare Other

## 2021-03-11 DIAGNOSIS — R7309 Other abnormal glucose: Secondary | ICD-10-CM

## 2021-03-11 DIAGNOSIS — Z131 Encounter for screening for diabetes mellitus: Secondary | ICD-10-CM

## 2021-03-11 DIAGNOSIS — E039 Hypothyroidism, unspecified: Secondary | ICD-10-CM

## 2021-03-12 ENCOUNTER — Telehealth: Payer: Self-pay | Admitting: Family Medicine

## 2021-03-12 ENCOUNTER — Telehealth (HOSPITAL_COMMUNITY): Payer: Self-pay | Admitting: *Deleted

## 2021-03-12 LAB — T3, FREE: T3, Free: 2.9 pg/mL (ref 2.0–4.4)

## 2021-03-12 LAB — HEMOGLOBIN A1C
Est. average glucose Bld gHb Est-mCnc: 108 mg/dL
Hgb A1c MFr Bld: 5.4 % (ref 4.8–5.6)

## 2021-03-12 LAB — TSH+FREE T4
Free T4: 1.48 ng/dL (ref 0.82–1.77)
TSH: 0.341 u[IU]/mL — ABNORMAL LOW (ref 0.450–4.500)

## 2021-03-12 LAB — GLUCOSE, RANDOM: Glucose: 113 mg/dL — ABNORMAL HIGH (ref 70–99)

## 2021-03-12 NOTE — Telephone Encounter (Signed)
Detailed VM from patient stating with Jodi Gilmore direction she has weaned off the Prozac and is weaning off the Geodon, currently taking 40 mg daily. She reports extreme anxiety, headache she cant get rid of with OTC meds, anxiety keeping her from leaving the house, more irritability, and in a months time she reports going from hypothyroid to hyperthyroid. She would like to speak with Jodi Snuffer PA re her sx and concerns. WIll forward message to him.  ?

## 2021-03-12 NOTE — Telephone Encounter (Signed)
Error.  ?Jodi Bankhead, MD ? ?

## 2021-03-13 ENCOUNTER — Other Ambulatory Visit (HOSPITAL_COMMUNITY): Payer: Self-pay | Admitting: Physician Assistant

## 2021-03-13 DIAGNOSIS — F411 Generalized anxiety disorder: Secondary | ICD-10-CM

## 2021-03-13 MED ORDER — BUSPIRONE HCL 7.5 MG PO TABS: 7.5000 mg | ORAL_TABLET | Freq: Two times a day (BID) | ORAL | 0 refills | Status: DC

## 2021-03-13 NOTE — Progress Notes (Signed)
Provider was able to contact patient to discuss patient's symptoms and concerns.  During the time of the discussion, patient has completely tapered herself off Prozac.  Patient is currently tapering from Geodon 40 mg daily.  Patient reports experiencing elevated anxiety as well as irritability.  She states that her irritability is worse when interacting with others, so she tends to stay indoors more often.  While indoors, patient states that her irritability is much more manageable.  She reports that it is difficult to discern if her symptoms are due to tapering or if they are from her thyroid issues.  She does report that since lowering her dose of Geodon, her GI issues have been more tolerable and she has had to rely less on Prilosec.  Patient would like to continue tapering off her medications and is interested in being placed on an anxiety medication.  Provider recommended buspirone 7.5 mg 2 times daily for the management of her anxiety.  Patient was agreeable to recommendation.  Patient's medication to be e-prescribed to pharmacy of choice. ?

## 2021-03-13 NOTE — Telephone Encounter (Signed)
Patient was contacted by Glory Buff. Olevia Bowens, RN regarding patient's concerns regarding her symptoms.  Provider was able to contact patient to discuss concerns and symptoms.  Patient would like to continue tapering off Geodon.  Provider recommended buspirone 7.5 mg 2 times daily for the management of her anxiety.  Patient was agreeable to recommendation.  Patient's medication to be e-prescribed to pharmacy of choice.

## 2021-03-16 ENCOUNTER — Telehealth (HOSPITAL_COMMUNITY): Payer: Self-pay | Admitting: *Deleted

## 2021-03-16 NOTE — Telephone Encounter (Signed)
Rx SENT REFILL REQUEST FOR : ?amphetamine-dextroamphetamine (ADDERALL) 10 MG tablet ?

## 2021-03-18 ENCOUNTER — Other Ambulatory Visit (HOSPITAL_COMMUNITY): Payer: Self-pay | Admitting: Physician Assistant

## 2021-03-18 ENCOUNTER — Telehealth (HOSPITAL_COMMUNITY): Payer: Self-pay | Admitting: *Deleted

## 2021-03-18 DIAGNOSIS — F909 Attention-deficit hyperactivity disorder, unspecified type: Secondary | ICD-10-CM

## 2021-03-18 MED ORDER — AMPHETAMINE-DEXTROAMPHETAMINE 10 MG PO TABS: 10.0000 mg | ORAL_TABLET | Freq: Every day | ORAL | 0 refills | Status: DC

## 2021-03-18 NOTE — Progress Notes (Signed)
Provider was contacted by Glory Buff. Olevia Bowens, RN regarding patient's request for Adderall refill.  Patient's medication to be e-prescribed to pharmacy of choice. ?

## 2021-03-18 NOTE — Telephone Encounter (Signed)
Message acknowledged and reviewed. Patient's medication sent to pharmacy of choice.

## 2021-03-18 NOTE — Telephone Encounter (Signed)
Second request received for patients Adderall to be sent in. Last filled on 02/21/21. Per last progress note she is tapering off some of her medicines, unclear if Adderall is one of them. Will sent request to [facility] for his response. ?

## 2021-03-18 NOTE — Telephone Encounter (Signed)
Message acknowledged and reviewed. Patient's medication sent to pharmacy of choice.

## 2021-03-20 ENCOUNTER — Telehealth (INDEPENDENT_AMBULATORY_CARE_PROVIDER_SITE_OTHER): Payer: Medicare Other | Admitting: Physician Assistant

## 2021-03-20 ENCOUNTER — Encounter (HOSPITAL_COMMUNITY): Payer: Self-pay | Admitting: Physician Assistant

## 2021-03-20 DIAGNOSIS — F411 Generalized anxiety disorder: Secondary | ICD-10-CM

## 2021-03-20 DIAGNOSIS — F3160 Bipolar disorder, current episode mixed, unspecified: Secondary | ICD-10-CM

## 2021-03-20 DIAGNOSIS — F909 Attention-deficit hyperactivity disorder, unspecified type: Secondary | ICD-10-CM | POA: Diagnosis not present

## 2021-03-20 NOTE — Progress Notes (Unsigned)
BH MD/PA/NP OP Progress Note  Virtual Visit via Video Note  I connected with Jodi Gilmore on 03/20/21 at 10:00 AM EST by a video enabled telemedicine application and verified that I am speaking with the correct person using two identifiers.  Location: Patient: Home Provider: Clinic   I discussed the limitations of evaluation and management by telemedicine and the availability of in person appointments. The patient expressed understanding and agreed to proceed.  Follow Up Instructions:  I discussed the assessment and treatment plan with the patient. The patient was provided an opportunity to ask questions and all were answered. The patient agreed with the plan and demonstrated an understanding of the instructions.   The patient was advised to call back or seek an in-person evaluation if the symptoms worsen or if the condition fails to improve as anticipated.  I provided 18 minutes of non-face-to-face time during this encounter.  Malachy Mood, PA    03/20/2021 9:01 PM Jodi Gilmore  MRN:  [id number]  Chief Complaint:  Chief Complaint  Patient presents with   Follow-up   HPI: ***  Jodi Sebring "[name]"  Visit Diagnosis:    ICD-10-CM   1. Attention deficit hyperactivity disorder (ADHD), unspecified ADHD type  F90.9     2. Generalized anxiety disorder  F41.1     3. Bipolar affective disorder, current episode mixed, current episode severity unspecified ([place])  F31.60       Past Psychiatric History:  Bipolar Disorder ADHD Generalized anxiety disorder Social anxiety  Past Medical History:  Past Medical History:  Diagnosis Date   Allergy    Anxiety    situational    Asthma    yeras ago - not current    Behcet's disease ([place])    Bell's palsy    Cataract    both removed    Depression    situational -    Diplopia    Elevated blood pressure reading    no BP meds currently    Family history of adverse reaction to anesthesia    GERD (gastroesophageal reflux  disease)    Graves disease 1991   Heart murmur    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    Hip pain    HLA B27 (HLA B27 positive)    Hyperlipidemia    Hypertension    Knee pain    Memory changes    Morphea scleroderma    Neuromuscular disorder ([place])    Raynauds    Pneumonia    Psoriatic arthritis ([place])    seen in ears only   Raynaud's disease    Sjogren syndrome with dental involvement ([place])     Past Surgical History:  Procedure Laterality Date   CYST REMOVAL HAND     left arm posterior   laproscopy     SALPINGECTOMY  1980   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   Procedure: LEFT TOTAL HIP ARTHROPLASTY ANTERIOR APPROACH;  Surgeon: Mcarthur Rossetti, MD;  Location: [facility];  Service: Orthopedics;  Laterality: Left;   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      Family Psychiatric History:  Nephew - Schizophrenia Father - patient suspects that her father may be bipolar  Family History:  Family History  Problem Relation Age of Onset   Heart disease Mother    Congestive Heart Failure Mother    Dementia Mother    Heart disease Father    Heart attack Father    Dementia Sister  Hemachromatosis Sister    Parkinson's disease Sister    Allergies Son    Healthy Son    Colon cancer Neg Hx    Colon polyps Neg Hx    Esophageal cancer Neg Hx    Stomach cancer Neg Hx    Rectal cancer Neg Hx     Social History:  Social History   Socioeconomic History   Marital status: Single    Spouse name: Not on file   Number of children: Not on file   Years of education: Not on file   Highest education level: Not on file  Occupational History   Not on file  Tobacco Use   Smoking status: Former   Smokeless tobacco: Never  Vaping Use   Vaping Use: Never used  Substance and Sexual Activity   Alcohol use: Not Currently   Drug use: Never   Sexual activity: Not on file  Other Topics Concern   Not on file  Social History Narrative   Not on file    Social Determinants of Health   Financial Resource Strain: Not on file  Food Insecurity: Not on file  Transportation Needs: Not on file  Physical Activity: Not on file  Stress: Not on file  Social Connections: Not on file    Allergies:  Allergies  Allergen Reactions   Contrast Media [Iodinated Contrast Media] Anaphylaxis   Prohance [Gadoteridol] Anaphylaxis   Ptu [Propylthiouracil]    Sulfa Antibiotics     Unknown reaction, family history   Tapazole [Methimazole]    Latex Hives and Rash    Metabolic Disorder Labs: Lab Results  Component Value Date   HGBA1C 5.4 03/11/2021   No results found for: PROLACTIN Lab Results  Component Value Date   CHOL 272 (H) 02/12/2020   TRIG 145 02/12/2020   HDL 71 02/12/2020   CHOLHDL 3.8 02/12/2020   LDLCALC 175 (H) 02/12/2020   Lab Results  Component Value Date   TSH 0.341 (L) 03/11/2021   TSH 25.900 (H) 02/03/2021    Therapeutic Level Labs: No results found for: LITHIUM No results found for: VALPROATE No components found for:  CBMZ  Current Medications: Current Outpatient Medications  Medication Sig Dispense Refill   acetaminophen (TYLENOL) 325 MG tablet Take 650 mg by mouth every 6 (six) hours as needed for moderate pain.     amphetamine-dextroamphetamine (ADDERALL) 10 MG tablet Take 1 tablet (10 mg total) by mouth daily. 30 tablet 0   aspirin 81 MG chewable tablet Chew 1 tablet (81 mg total) by mouth 2 (two) times daily. 60 tablet 0   aspirin-acetaminophen-caffeine (EXCEDRIN MIGRAINE) 250-250-65 MG tablet Take 2 tablets by mouth every 6 (six) hours as needed for headache.     atorvastatin (LIPITOR) 40 MG tablet TAKE ONE TABLET BY MOUTH ONE TIME DAILY 90 tablet 3   B Complex Vitamins (VITAMIN B COMPLEX PO) Take 1 tablet by mouth daily.     Bacillus Coagulans-Inulin (PROBIOTIC-PREBIOTIC PO) Take 1 capsule by mouth daily.     busPIRone (BUSPAR) 7.5 MG tablet Take 1 tablet (7.5 mg total) by mouth 2 (two) times daily. 60  tablet 0   cetirizine (ZYRTEC ALLERGY) 10 MG tablet Take 1 tablet (10 mg total) by mouth daily. 90 tablet 1   Cholecalciferol (VITAMIN D) 125 MCG (5000 UT) CAPS Take 10,000 Units by mouth daily.     diclofenac Sodium (VOLTAREN) 1 % GEL Apply 4 g topically 4 (four) times daily. (Patient taking differently: Apply 4 g topically daily as  needed (pain).) 150 g 4   FEVERFEW PO Take 1 tablet by mouth 3 (three) times daily.     FLUoxetine (PROZAC) 10 MG capsule After taking Prozac 20 mg total for 3 days, patient to take 1 tablet (10 mg total) for 3 days before discontinuing 3 capsule 0   FLUoxetine (PROZAC) 20 MG capsule Patient to take 2 tablets (40 mg total) for 3 days, then continue taking 1 tablet (20 mg total) for 3 more days. 9 capsule 0   FLUoxetine (PROZAC) 40 MG capsule Take 2 capsules (80 mg total) by mouth daily. 60 capsule 1   fluticasone (FLONASE) 50 MCG/ACT nasal spray USE TWO SPRAYS IN EACH NOSTRIL ONE TIME DAILY 16 mL 6   glucosamine-chondroitin 500-400 MG tablet Take 1 tablet by mouth 3 (three) times daily.     HYDROcodone-acetaminophen (NORCO/VICODIN) 5-325 MG tablet Take 1 tablet by mouth 3 (three) times daily as needed for moderate pain. 40 tablet 0   hydroxypropyl methylcellulose / hypromellose (ISOPTO TEARS / GONIOVISC) 2.5 % ophthalmic solution Place 1 drop into both eyes 3 (three) times daily as needed for dry eyes.     levothyroxine (SYNTHROID) 100 MCG tablet Take 1 tablet (100 mcg total) by mouth every morning. 30 minutes before food. Please come back to clinic in Feb for TSH recheck! 30 tablet 2   lisinopril (ZESTRIL) 2.5 MG tablet Take 1 tablet (2.5 mg total) by mouth at bedtime. 90 tablet 3   Magnesium Oxide (MAG-OX 400 PO) Take 400 mg by mouth in the morning and at bedtime.     Melatonin 1 MG SUBL      methocarbamol (ROBAXIN) 500 MG tablet TAKE ONE TABLET BY MOUTH EVERY 6 HOURS AS NEEDED FOR MUSCLE SPASM 40 tablet 1   Multiple Vitamin (MULTIVITAMIN) tablet Take 1 tablet by  mouth daily.     Omega-3 Fatty Acids (OMEGA-3 EPA FISH OIL PO) Take 3 capsules by mouth daily.     omeprazole (PRILOSEC) 20 MG capsule TAKE ONE CAPSULE BY MOUTH EVERY MORNING AND TAKE ONE CAPSULE BY MOUTH AT BEDTIME 90 capsule 0   ondansetron (ZOFRAN) 4 MG tablet Take 1 tablet (4 mg total) by mouth every 6 (six) hours as needed for nausea. 20 tablet 0   OVER THE COUNTER MEDICATION Take 4 capsules by mouth at bedtime. Calm Forte     Potassium Bicarbonate 99 MG CAPS Take 99 mg by mouth 3 (three) times daily.     RESTASIS 0.05 % ophthalmic emulsion INSTILL ONE DROP INTO EACH EYE TWICE DAILY 60 each 3   rizatriptan (MAXALT) 10 MG tablet TAKE ONE TABLET BY MOUTH AT ONSET OF HEADACHE, MAY REPEAT DOSE WITH ONE TABLET IN TWO HOURS IF NEEDED 10 tablet 0   traMADol (ULTRAM) 50 MG tablet TAKE ONE TABLET BY MOUTH EVERY 6 HOURS AS NEEDED 30 tablet 0   TURMERIC PO Take 1 capsule by mouth in the morning, at noon, and at bedtime.     valACYclovir (VALTREX) 500 MG tablet TAKE ONE TABLET BY MOUTH TWICE A DAY FOR 7 DAYS 30 tablet 2   ziprasidone (GEODON) 20 MG capsule Patient to take 1 tablet (20 mg total) for 7 days before discontinuing medication 7 capsule 0   ziprasidone (GEODON) 40 MG capsule Patient to take 1 tablet (40 mg total) for 7 days 7 capsule 0   ziprasidone (GEODON) 60 MG capsule Patient to take 1 tablet (60 mg total) for 7 days. 7 capsule 0   No current facility-administered  medications for this visit.     Musculoskeletal: Strength & Muscle Tone: Unable to assess due to telemedicine visit [place]: Unable to assess due to telemedicine visit Patient leans: Unable to assess due to telemedicine visit  Psychiatric Specialty Exam: Review of Systems  Psychiatric/Behavioral:  Positive for sleep disturbance. Negative for decreased concentration, dysphoric mood, hallucinations, self-injury and suicidal ideas. The patient is nervous/anxious and is hyperactive.    There were no vitals taken for  this visit.There is no height or weight on file to calculate BMI.  General Appearance: Casual  Eye Contact:  Good  Speech:  {Speech:22685}  Volume:  {Volume (PAA):22686}  Mood:  {BHH MOOD:22306}  Affect:  {Affect (PAA):22687}  Thought Process:  {Thought Process (PAA):22688}  Orientation:  {BHH ORIENTATION (PAA):22689}  Thought Content: {Thought Content:22690}   Suicidal Thoughts:  {ST/HT (PAA):22692}  Homicidal Thoughts:  {ST/HT (PAA):22692}  Memory:  {BHH MEMORY:22881}  Judgement:  {Judgement (PAA):22694}  Insight:  {Insight (PAA):22695}  Psychomotor Activity:  {Psychomotor (PAA):22696}  Concentration:  {Concentration:21399}  Recall:  {BHH GOOD/FAIR/POOR:22877}  Fund of Knowledge: {BHH GOOD/FAIR/POOR:22877}  Language: {BHH GOOD/FAIR/POOR:22877}  Akathisia:  {BHH YES OR NO:22294}  Handed:  {Handed:22697}  AIMS (if indicated): {Desc; done/not:10129}  Assets:  {Assets (PAA):22698}  ADL's:  {BHH MBE'M:75449}  Cognition: {chl bhh cognition:304700322}  Sleep:  {BHH GOOD/FAIR/POOR:22877}   Screenings: GAD-7    Flowsheet Row Video Visit from 03/20/2021 in [facility] Video Visit from 02/19/2021 in [facility] Video Visit from 12/16/2020 in [facility] Video Visit from 10/22/2020 in [facility] Video Visit from 08/28/2020 in [facility]  Total GAD-7 Score _0 EEF0-0    Flowsheet Row Video Visit from 03/20/2021 in [facility] Video Visit from 02/19/2021 in [facility] Video Visit from 12/16/2020 in [facility] Video Visit from 10/22/2020 in [facility] Office Visit from 09/25/2020 in [place]  PHQ-2 Total Score _1 PHQ-9 Total Score _2 Flowsheet Row Video Visit from  03/20/2021 in [facility] Video Visit from 02/19/2021 in [facility] Video Visit from 12/16/2020 in [place] CATEGORY Low Risk Low Risk Low Risk        Assessment and Plan: ***  Collaboration of Care: Collaboration of Care: Medication Management AEB ***, Psychiatrist AEB ***, and Referral or follow-up with counselor/therapist AEB ***  Patient/Guardian was advised Release of Information must be obtained prior to any record release in order to collaborate their care with an outside provider. Patient/Guardian was advised if they have not already done so to contact the registration department to sign all necessary forms in order for [place] to release information regarding their care.   Consent: Patient/Guardian gives verbal consent for treatment and assignment of benefits for services provided during this visit. Patient/Guardian expressed understanding and agreed to proceed.    Malachy Mood, PA 03/20/2021, 9:01 PM

## 2021-03-24 ENCOUNTER — Other Ambulatory Visit: Payer: Self-pay | Admitting: Family Medicine

## 2021-03-30 ENCOUNTER — Ambulatory Visit: Payer: Medicare Other | Admitting: Family Medicine

## 2021-03-31 ENCOUNTER — Ambulatory Visit: Payer: Medicare Other | Admitting: Family Medicine

## 2021-04-02 ENCOUNTER — Other Ambulatory Visit (HOSPITAL_COMMUNITY): Payer: Self-pay | Admitting: *Deleted

## 2021-04-02 ENCOUNTER — Telehealth (HOSPITAL_COMMUNITY): Payer: Self-pay | Admitting: *Deleted

## 2021-04-02 ENCOUNTER — Other Ambulatory Visit (HOSPITAL_COMMUNITY): Payer: Self-pay | Admitting: Physician Assistant

## 2021-04-02 DIAGNOSIS — F3160 Bipolar disorder, current episode mixed, unspecified: Secondary | ICD-10-CM

## 2021-04-02 MED ORDER — QUETIAPINE FUMARATE 50 MG PO TABS: 50.0000 mg | ORAL_TABLET | Freq: Every day | ORAL | 1 refills | Status: DC

## 2021-04-02 MED ORDER — ZIPRASIDONE HCL 20 MG PO CAPS: ORAL_CAPSULE | ORAL | 0 refills | Status: DC

## 2021-04-02 NOTE — Progress Notes (Signed)
Provider was contacted by Orpah [place]. Reola Calkins, RN regarding patients issue with tapering off her Geodon.  Per message, patient has been unable to completely go off of her Geodon because she is unable to sleep without it.  Provider was able to contact patient.  Patient complains of being unable to sleep as well as some hypomanic symptoms.  Patient is currently still taking Geodon 20 mg daily.  Provider discussed potential options for patient to use for the management of her symptoms.  Patient has not been on the following medications: Seroquel, Abilify, Vraylar, Latuda, and Caplyta.  Patient is hesitant to be placed on any medication that may cause her weight gain.  After some discussion, patient agreed to be placed on Seroquel.  Patient was advised to monitor weight and the types of food she eats while on Seroquel.  Patient was also told to continue taking Geodon 20 mg every other day for 6 days followed by every 2 days for 9 days before discontinuing medication.  Patient was agreeable to recommendation.  Patient's medications to be e-prescribed to pharmacy of choice. ?

## 2021-04-02 NOTE — Telephone Encounter (Signed)
Patient was contacted by Orpah [place]. Reola Calkins, RN regarding patient's experience with tapering off Geodon.  Provider was able to contact patient and discuss issues.  Patient states that she has been experiencing hypomanic symptoms as well as being unable to sleep since decreasing her dosage of Geodon.  Patient would like to discontinue taking Geodon due to improvement in GI status issues.  Provider discuss available options with patient and patient to be placed on Seroquel 50 mg at bedtime.  Patient was provided further instructions on discontinuing Geodon.  Patient's medication to be e-prescribed to pharmacy of choice.

## 2021-04-02 NOTE — Telephone Encounter (Signed)
VM left on writers phone requesting to speak with [place] PA as "things are not going well" states she can not get herself completely off the Geodon, she cant sleep without it. States her anxiety is extremely high and she doesn't know if her buspar can be increased or the frequency of it be increased. She would like Eddie to call her and give her directions. ?

## 2021-04-08 ENCOUNTER — Other Ambulatory Visit (HOSPITAL_COMMUNITY): Payer: Self-pay | Admitting: Physician Assistant

## 2021-04-08 ENCOUNTER — Ambulatory Visit: Payer: Medicare Other | Admitting: Family Medicine

## 2021-04-08 ENCOUNTER — Telehealth (HOSPITAL_COMMUNITY): Payer: Self-pay | Admitting: *Deleted

## 2021-04-08 DIAGNOSIS — F411 Generalized anxiety disorder: Secondary | ICD-10-CM

## 2021-04-08 MED ORDER — BUSPIRONE HCL 7.5 MG PO TABS
7.5000 mg | ORAL_TABLET | Freq: Two times a day (BID) | ORAL | 1 refills | Status: DC
Start: 2021-04-08 — End: 2021-06-09

## 2021-04-08 NOTE — Telephone Encounter (Signed)
Pharmacy faxed request for patients Jodi Gilmore, She should be out of it on 4/4, she last filled on 03/15/21. Her future appt with Link Snuffer PA is on 04/22/21. Will reach out to [place] PA to request he send a new rx in for her as she will be out before her next appt.  ?

## 2021-04-08 NOTE — Telephone Encounter (Signed)
Provider was contacted by Suzanne K. Beck, RN regarding patient's request for medication refill.  Patient's medication to be e-prescribed to pharmacy of choice. 

## 2021-04-08 NOTE — Progress Notes (Signed)
Provider was contacted by Suzanne K. Beck, RN regarding patient's request for medication refill.  Patient's medication to be e-prescribed to pharmacy of choice. 

## 2021-04-08 NOTE — Progress Notes (Deleted)
? ? ?  SUBJECTIVE:  ? ?CHIEF COMPLAINT / HPI: labs  ? ?*** ? ?PERTINENT  PMH / PSH: *** ? ?OBJECTIVE:  ? ?There were no vitals taken for this visit.  ?*** ? ?ASSESSMENT/PLAN:  ? ?No problem-specific Assessment & Plan notes found for this encounter. ?  ? ? ?Ronnald Ramp, MD ?[facility]  ?

## 2021-04-13 ENCOUNTER — Ambulatory Visit: Payer: Medicare Other | Admitting: Internal Medicine

## 2021-04-14 ENCOUNTER — Ambulatory Visit: Payer: Medicare Other | Admitting: Internal Medicine

## 2021-04-16 ENCOUNTER — Telehealth (HOSPITAL_COMMUNITY): Payer: Self-pay | Admitting: *Deleted

## 2021-04-16 ENCOUNTER — Other Ambulatory Visit (HOSPITAL_COMMUNITY): Payer: Self-pay | Admitting: Physician Assistant

## 2021-04-16 DIAGNOSIS — F909 Attention-deficit hyperactivity disorder, unspecified type: Secondary | ICD-10-CM

## 2021-04-16 MED ORDER — AMPHETAMINE-DEXTROAMPHETAMINE 10 MG PO TABS
10.0000 mg | ORAL_TABLET | Freq: Every day | ORAL | 0 refills | Status: DC
Start: 2021-04-16 — End: 2021-04-22

## 2021-04-16 NOTE — Telephone Encounter (Signed)
Provider was contacted by Suzanne K. Beck, RN regarding patient's medication refill. Patient's medication to be e-prescribed to pharmacy of choice.

## 2021-04-16 NOTE — Progress Notes (Signed)
Provider was contacted by Suzanne K. Beck, RN regarding patient's medication refill. Patient's medication to be e-prescribed to pharmacy of choice.

## 2021-04-16 NOTE — Telephone Encounter (Signed)
Called patient to inform her the adderall was called in for her this afternoon. She said while on phone with me that the seroquel and buspar were having paradoxical effects, seroquel not making her tired or less anxious and buspar making her more anxious yet she continues them because she doesn't know how to get off of them. She also continues to take geodon. She would like to speak with Link Snuffer PA her provider re her meds.  ?

## 2021-04-16 NOTE — Telephone Encounter (Signed)
Fax request from pharmacy for her stimulant, she should be out per the chart. Will notify Eddie PA for a new rx to be sent in to her preferred pharmacy which is [facility].  ?

## 2021-04-16 NOTE — Telephone Encounter (Signed)
Opened a second time in error. 

## 2021-04-21 ENCOUNTER — Encounter: Payer: Self-pay | Admitting: Family Medicine

## 2021-04-22 ENCOUNTER — Telehealth (INDEPENDENT_AMBULATORY_CARE_PROVIDER_SITE_OTHER): Payer: Medicare Other | Admitting: Physician Assistant

## 2021-04-22 ENCOUNTER — Other Ambulatory Visit: Payer: Self-pay | Admitting: Family Medicine

## 2021-04-22 DIAGNOSIS — F3160 Bipolar disorder, current episode mixed, unspecified: Secondary | ICD-10-CM

## 2021-04-22 DIAGNOSIS — E039 Hypothyroidism, unspecified: Secondary | ICD-10-CM

## 2021-04-22 DIAGNOSIS — F909 Attention-deficit hyperactivity disorder, unspecified type: Secondary | ICD-10-CM | POA: Diagnosis not present

## 2021-04-22 MED ORDER — LEVOTHYROXINE SODIUM 100 MCG PO TABS: 100.0000 ug | ORAL_TABLET | ORAL | 0 refills | Status: DC

## 2021-04-22 MED ORDER — AMPHETAMINE-DEXTROAMPHETAMINE 10 MG PO TABS: 10.0000 mg | ORAL_TABLET | Freq: Two times a day (BID) | ORAL | 0 refills | Status: DC

## 2021-04-22 MED ORDER — QUETIAPINE FUMARATE 150 MG PO TABS: 150.0000 mg | ORAL_TABLET | Freq: Every day | ORAL | 1 refills | Status: DC

## 2021-04-22 NOTE — Progress Notes (Addendum)
BH MD/PA/NP OP Progress Note ? ?Virtual Visit via Video Note ? ?I connected with [name] on 04/23/21 at  5:00 PM EDT by a video enabled telemedicine application and verified that I am speaking with the correct person using two identifiers. ? ?Location: ?Patient: Home ?Provider: Clinic ?  ?I discussed the limitations of evaluation and management by telemedicine and the availability of in person appointments. The patient expressed understanding and agreed to proceed. ? ?Follow Up Instructions: ? ?I discussed the assessment and treatment plan with the patient. The patient was provided an opportunity to ask questions and all were answered. The patient agreed with the plan and demonstrated an understanding of the instructions. ?  ?The patient was advised to call back or seek an in-person evaluation if the symptoms worsen or if the condition fails to improve as anticipated. ? ?I provided 27 minutes of non-face-to-face time during this encounter. ? ?Malachy Mood, PA  ? ? ?04/23/2021 12:53 AM ?[name]  ?MRN:  [id number] ? ?Chief Complaint:  ?Chief Complaint  ?Patient presents with  ? Follow-up  ? Medication Management  ? ?HPI:  ? ?Lenda Baratta "[name]" is a 66 year old female with a past psychiatric history significant for bipolar disorder, attention deficit hyperactivity disorder (unspecified type), and generalized anxiety disorder who presents to [facility] via virtual video visit for follow-up and medication management.  Patient is currently being managed on the following medications: ? ?Adderall 10 mg daily ?Buspirone 7.5 mg 2 times daily ?Seroquel 50 mg at bedtime ? ?Patient reports that she would like to discontinue taking buspirone because she feels as though the medication makes her hungry and crazy.  Patient reports that she has also been unable to completely wean herself off of Geodon 20 mg daily due to not being able to sleep.  Patient states that she finds  herself having paradoxical symptoms since being placed on her current regimen of medications.  Patient states that she finds herself often staying up at night.  Patient endorses depression but states that her symptoms have not been as severe as of late.  She still finds herself neglecting activities of daily living, making poor decisions, and canceling appointments.  Patient feels that the only thing that is able to keep her functioning is her Adderall.   ? ?Patient states that she has been on tricyclic antidepressants in the past, specifically amitriptyline for the management of her depressive symptoms.  Patient continues to endorse anxiety but currently feels calm due to having no appointments later in the week.  Patient's current stressor includes weight gain and trouble focusing.  Patient is interested in increasing her dosage of Adderall due to her inability to pay attention as well as function during her day today.  A PHQ-9 screen was performed with the patient scoring a 20.  A GAD-7 screen was also performed with the patient scored 14. ? ?Patient is alert and oriented x4, calm, cooperative, and fully engaged in conversation during the encounter.  Patient describes her mood as shitty.  Patient denies suicidal ideation describing her son as being a protective factor.  Patient denies homicidal ideations.  She further denies auditory or visual hallucinations and does not appear to be responding to internal/external stimuli.  Patient endorses fair sleep and receives on average 6 hours of sleep each night.  Patient endorses fair appetite and eats on average 6 small meals a day.  She states that they either ranged from healthy and small to crappy and  big.  Patient denies alcohol consumption, tobacco use, and illicit drug use. ? ?Visit Diagnosis:  ?  ICD-10-CM   ?1. Bipolar affective disorder, current episode mixed, current episode severity unspecified (HCC)  F31.60 QUEtiapine 150 MG TABS  ?  ?2. Attention deficit  hyperactivity disorder (ADHD), unspecified ADHD type  F90.9 amphetamine-dextroamphetamine (ADDERALL) 10 MG tablet  ?  ? ? ?Past Psychiatric History:  ?Bipolar Disorder ?ADHD ?Generalized anxiety disorder ?Social anxiety ? ?Past Medical History:  ?Past Medical History:  ?Diagnosis Date  ? Allergy   ? Anxiety   ? situational   ? Asthma   ? yeras ago - not current   ? Behcet's disease ([place])   ? Bell's palsy   ? Cataract   ? both removed   ? Depression   ? situational -   ? Diplopia   ? Elevated blood pressure reading   ? no BP meds currently   ? Family history of adverse reaction to anesthesia   ? GERD (gastroesophageal reflux disease)   ? Graves disease 1991  ? Heart murmur   ? 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 Hip pain   ? HLA B27 (HLA B27 positive)   ? Hyperlipidemia   ? Hypertension   ? Knee pain   ? Memory changes   ? Morphea scleroderma   ? Neuromuscular disorder ([place])   ? Raynauds   ? Pneumonia   ? Psoriatic arthritis ([place])   ? seen in ears only  ? Raynaud's disease   ? Sjogren syndrome with dental involvement ([place])   ?  ?Past Surgical History:  ?Procedure Laterality Date  ? CYST REMOVAL HAND    ? left arm posterior  ? laproscopy    ? SALPINGECTOMY  1980  ? 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 Procedure: LEFT TOTAL HIP ARTHROPLASTY ANTERIOR APPROACH;  Surgeon: Mcarthur Rossetti, MD;  Location: [facility];  Service: Orthopedics;  Laterality: Left;  ? 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    ? ? ?Family Psychiatric History:  ?Nephew - Schizophrenia ?Father - patient suspects that her father may be bipolar ? ?Family History:  ?Family History  ?Problem Relation Age of Onset  ? Heart disease Mother   ? Congestive Heart Failure Mother   ? Dementia Mother   ? Heart disease Father   ? Heart attack Father   ? Dementia Sister   ? Hemachromatosis Sister   ? Parkinson's disease Sister   ? Allergies Son   ? Healthy Son   ? Colon cancer Neg Hx   ? Colon polyps Neg Hx   ? Esophageal  cancer Neg Hx   ? Stomach cancer Neg Hx   ? Rectal cancer Neg Hx   ? ? ?Social History:  ?Social History  ? ?Socioeconomic History  ? Marital status: Single  ?  Spouse name: Not on file  ? Number of children: Not on file  ? Years of education: Not on file  ? Highest education level: Not on file  ?Occupational History  ? Not on file  ?Tobacco Use  ? Smoking status: Former  ? Smokeless tobacco: Never  ?Vaping Use  ? Vaping Use: Never used  ?Substance and Sexual Activity  ? Alcohol use: Not Currently  ? Drug use: Never  ? Sexual activity: Not on file  ?Other Topics Concern  ? Not on file  ?Social History Narrative  ? Not on file  ? ?Social Determinants of Health  ? ?  Financial Resource Strain: Not on file  ?Food Insecurity: Not on file  ?Transportation Needs: Not on file  ?Physical Activity: Not on file  ?Stress: Not on file  ?Social Connections: Not on file  ? ? ?Allergies:  ?Allergies  ?Allergen Reactions  ? Contrast Media [Iodinated Contrast Media] Anaphylaxis  ? Prohance [Gadoteridol] Anaphylaxis  ? Ptu [Propylthiouracil]   ? Sulfa Antibiotics   ?  Unknown reaction, family history  ? Tapazole [Methimazole]   ? Latex Hives and Rash  ? ? ?Metabolic Disorder Labs: ?Lab Results  ?Component Value Date  ? HGBA1C 5.4 03/11/2021  ? ?No results found for: PROLACTIN ?Lab Results  ?Component Value Date  ? CHOL 272 (H) 02/12/2020  ? TRIG 145 02/12/2020  ? HDL 71 02/12/2020  ? CHOLHDL 3.8 02/12/2020  ? LDLCALC 175 (H) 02/12/2020  ? ?Lab Results  ?Component Value Date  ? TSH 0.341 (L) 03/11/2021  ? TSH 25.900 (H) 02/03/2021  ? ? ?Therapeutic Level Labs: ?No results found for: LITHIUM ?No results found for: VALPROATE ?No components found for:  CBMZ ? ?Current Medications: ?Current Outpatient Medications  ?Medication Sig Dispense Refill  ? acetaminophen (TYLENOL) 325 MG tablet Take 650 mg by mouth every 6 (six) hours as needed for moderate pain.    ? [START ON 05/15/2021] amphetamine-dextroamphetamine (ADDERALL) 10 MG tablet Take 1  tablet (10 mg total) by mouth 2 (two) times daily with a meal. 60 tablet 0  ? aspirin 81 MG chewable tablet Chew 1 tablet (81 mg total) by mouth 2 (two) times daily. 60 tablet 0  ? aspirin-acetaminophen-caffe

## 2021-04-23 ENCOUNTER — Other Ambulatory Visit: Payer: Self-pay | Admitting: Family Medicine

## 2021-04-23 ENCOUNTER — Encounter (HOSPITAL_COMMUNITY): Payer: Self-pay | Admitting: Physician Assistant

## 2021-04-23 NOTE — Telephone Encounter (Signed)
Patient had a follow-up appointment scheduled for yesterday which she attended.  Patient's medications were adjusted accordingly upon conclusion of the encounter.

## 2021-05-05 ENCOUNTER — Ambulatory Visit (INDEPENDENT_AMBULATORY_CARE_PROVIDER_SITE_OTHER): Payer: Medicare Other | Admitting: Clinical

## 2021-05-05 DIAGNOSIS — F909 Attention-deficit hyperactivity disorder, unspecified type: Secondary | ICD-10-CM

## 2021-05-05 DIAGNOSIS — F3160 Bipolar disorder, current episode mixed, unspecified: Secondary | ICD-10-CM

## 2021-05-11 ENCOUNTER — Other Ambulatory Visit: Payer: Self-pay | Admitting: Family Medicine

## 2021-05-11 ENCOUNTER — Telehealth (HOSPITAL_COMMUNITY): Payer: Self-pay | Admitting: *Deleted

## 2021-05-11 NOTE — Telephone Encounter (Signed)
Patient called for new medication change  ?amphetamine-dextroamphetamine (ADDERALL) 10 MG tablet ?Take 1 tablet (10 mg total) by mouth 2 (two) times daily with a meal  ? ?Patient wanted to inform that the   Seroquel from 50mg  to 150 mg at bedtime for the management of her mood.   ?Is working a little bit but she's gaining weight ? ?PATIENT STATES SHE OK WITH STARTING NEW MOOD STABILIZER  ?**BUT SHE HAS STEVEN JOHNSON & LAMICTAL & LEXAPRO AREN'T OPTIONS.   ? ?

## 2021-05-12 ENCOUNTER — Other Ambulatory Visit: Payer: Self-pay | Admitting: Family Medicine

## 2021-05-13 ENCOUNTER — Encounter: Payer: Self-pay | Admitting: Family Medicine

## 2021-05-16 ENCOUNTER — Encounter (HOSPITAL_COMMUNITY): Payer: Self-pay

## 2021-05-16 NOTE — Plan of Care (Signed)
?  Problem: Anxiety Disorder CCP Problem  1  Goal: LTG: Patient will score less than 5 on the Generalized Anxiety Disorder 7 Scale (GAD-7) Outcome: Not Applicable Goal: STG: Patient will reduce frequency of avoidant behaviors by 50% as evidenced by self-report in therapy sessions Outcome: Not Applicable   

## 2021-05-16 NOTE — Progress Notes (Signed)
Comprehensive Clinical Assessment (CCA) Note ? ?05/05/2021 ?Jodi Gilmore ?[id number] ? ?Virtual Visit via Video Note ? ?I connected with Jodi Gilmore on 05/05/2021 at 11:00 AM EDT by a video enabled telemedicine application and verified that I am speaking with the correct person using two identifiers. ? ?Location: ?Patient: home ?Provider: office ?  ?I discussed the limitations of evaluation and management by telemedicine and the availability of in person appointments. The patient expressed understanding and agreed to proceed. ? ? ?Follow Up Instructions: ?I discussed the assessment and treatment plan with the patient. The patient was provided an opportunity to ask questions and all were answered. The patient agreed with the plan and demonstrated an understanding of the instructions. ?  ?The patient was advised to call back or seek an in-person evaluation if the symptoms worsen or if the condition fails to improve as anticipated. ? ?I provided 40 minutes minutes of non-face-to-face time during this encounter. ? ? ?[place], LCSW  ? ?Chief Complaint:  ?Chief Complaint  ?Patient presents with  ? Anxiety  ? ADHD  ? ?Visit Diagnosis:  ?Bipolar affective disorder, current episode mixed, current episode severity unspecified ?ADHD, unspecified ADHD type ? ?Interpretive summary: ? Client is a 66 year old female presenting to the [facility] for outpatient services.  Client is currently engaged with [facility] for outpatient psychiatry services being treated for bipolar disorder and ADHD. Client reported she has ongoing symptoms of hypomania, depression, tremendous anxiety, difficulty leaving the house, infrequent sleep patterns, laying in bed majority of the day, and irritability.  Client reported her anxiety causes her to vomit, have diarrhea, and fevers.  Client reported her anxiety causes her to cancel obligations and/or appointments.  Client reported she has not been eating well or able to  get physical exercise.  Client reported she has not taken a shower in almost a week.  Client reported she was diagnosed with ADHD dating back to childhood years.  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  Client reported she was hospitalized in her 64s for mental health reasons.  Client reported she also has health issues that contribute to her inability to successfully complete task inside or outside the home.  Client reported she has been diagnosed for her double vision which causes dizziness and nausea.  Client reported she is afraid to drive and if she does have tasks that need to be done she likes to do them in the early morning because the darkness is easier on her vision. ?Client presented oriented x5, appropriately dressed, and friendly.  Client denied hallucinations, delusions, suicidal and homicidal ideations. client was screened for pain, nutrition, [place] suicide severity and the following S DOH: ? ?  04/22/2021  ?  5:24 PM 03/20/2021  ? 10:19 AM 02/19/2021  ?  4:52 PM 12/16/2020  ?  4:50 PM  ?GAD 7 : Generalized Anxiety Score  ?Nervous, Anxious, on Edge 3 3 2 1   ?Control/stop worrying 2 2 2 1   ?Worry too much - different things 3 2 2 1   ?Trouble relaxing 3 2 1 2   ?Restless 2 2 1 1   ?Easily annoyed or irritable 1 2 2 2   ?Afraid - awful might happen 0 0 1 0  ?Total GAD 7 Score 14 13 11 8   ?Anxiety Difficulty Somewhat difficult Very difficult Extremely difficult Somewhat difficult  ? ?  ?Flowsheet Row Video Visit from 04/22/2021 in [facility]  [facility]  ?PHQ-9 Total Score 20  ? ?  ?  ? ?Treatment recommendations: individual therapy and medication management ? ?Therapist provided information on format of appointment (virtual or face to face).  ? ?The client was advised to call back or seek an in-person evaluation if the symptoms worsen or if the condition fails to  improve as anticipated before the next scheduled appointment. ?Client was in agreement with treatment recommendations. ? ? ?CCA Biopsychosocial ?Intake/Chief Complaint:  Client presents as a current patient of [facility] psychiatry for the treatment of bipolar affective disorder, ADHD, and anxiety. ? ?Current Symptoms/Problems: Client reported hypomanic episodes, depressive symptoms, tremendous anxiety, difficulty leaving the house, laying in bed more than usual, irritability, difficulty maintaining hygiene/ grooming activities ? ?Patient Reported Schizophrenia/Schizoaffective Diagnosis in Past: No ? ?Strengths: voluntarily seeking outpatient services ? ?Preferences: therapy and psychiatry ? ?Abilities: goal oriented ? ?Type of Services Patient Feels are Needed: therapy and medication management ? ?Initial Clinical Notes/Concerns: No data recorded ? ?Mental Health Symptoms ?Depression:   ?Change in energy/activity; Irritability ?  ?Duration of Depressive symptoms:  ?Greater than two weeks ?  ?Mania:   ?Change in energy/activity ?  ?Anxiety:    ?Difficulty concentrating; Irritability; Tension; Worrying ?  ?Psychosis:   ?None ?  ?Duration of Psychotic symptoms: No data recorded  ?Trauma:   ?None ?  ?Obsessions:   ?None ?  ?Compulsions:   ?None ?  ?Inattention:   ?Poor follow-through on tasks; Symptoms before age 66; Symptoms present in 2 or more settings; Forgetful ?  ?Hyperactivity/Impulsivity:   ?Several symptoms present in 2 of more settings ?  ?Oppositional/Defiant Behaviors:   ?None ?  ?Emotional Irregularity:   ?None ?  ?Other Mood/Personality Symptoms:  No data recorded  ? ?Mental Status Exam ?Appearance and self-care  ?Stature:   ?Average ?  ?Weight:   ?Average weight ?  ?Clothing:   ?Casual ?  ?Grooming:   ?Normal ?  ?Cosmetic use:   ?Age appropriate ?  ?Posture/gait:   ?Normal ?  ?Motor activity:   ?Not Remarkable ?  ?Sensorium  ?Attention:   ?Normal ?  ?Concentration:   ?Normal ?  ?Orientation:   ?X5 ?   ?Recall/memory:   ?Normal ?  ?Affect and Mood  ?Affect:   ?Congruent ?  ?Mood:   ?Depressed ?  ?Relating  ?Eye contact:   ?Normal ?  ?Facial expression:   ?Responsive ?  ?Attitude toward examiner:   ?Cooperative ?  ?Thought and Language  ?Speech flow:  ?Clear and Coherent ?  ?Thought content:   ?Appropriate to Mood and Circumstances ?  ?Preoccupation:   ?None ?  ?Hallucinations:   ?None ?  ?Organization:  No data recorded  ?Executive Functions  ?Fund of Knowledge:   ?Good ?  ?Intelligence:   ?Average ?  ?Abstraction:   ?Normal ?  ?Judgement:   ?Good ?  ?Reality Testing:   ?Adequate ?  ?Insight:   ?Good ?  ?Decision Making:   ?Normal ?  ?Social Functioning  ?Social Maturity:   ?Responsible; Isolates ?  ?Social Judgement:   ?Normal ?  ?Stress  ?Stressors:   ?Financial ?  ?Coping Ability:   ?Resilient ?  ?Skill Deficits:   ?Activities of daily living; Self-care ?  ?Supports:   ?Family; Support needed ?  ? ? ?Religion: ?Religion/Spirituality ?Are You A Religious Person?: No ? ?Leisure/Recreation: ?Leisure / Recreation ?Do You Have Hobbies?: No ? ?Exercise/Diet: ?Exercise/Diet ?Do You Exercise?: No ?Have You Gained or Lost A Significant  Amount of Weight in the Past Six Months?: No ?Do You Follow a Special Diet?: No ?Do You Have Any Trouble Sleeping?: Yes ? ? ?CCA Employment/Education ?Employment/Work Situation: ?Employment / Work Situation ?Employment Situation: Unemployed ?What is the Longest Time Patient has Held a Job?: Client reported she does have previous work experience including caretaking, working with social services, and working as a Museum/gallery curator ? ?Education: ?Education ?Did You Graduate From [employer]?: Yes ?Did You Attend College?: Yes ?What Type of College Degree Do you Have?: Client reported some college experience while in [place] ? ? ?CCA Family/Childhood History ?Family and Relationship History: ?Family history ?Marital status: Single ?Does patient have children?: Yes ?How many  children?: 1 ?How is patient's relationship with their children?: Client reported she has a 30 year old son who lives outside the home. Client reported they have a good relationship. ? ?Childhood History:  ?Childhood Hi

## 2021-05-20 NOTE — Telephone Encounter (Signed)
Message acknowledged and reviewed.

## 2021-05-28 ENCOUNTER — Other Ambulatory Visit: Payer: Self-pay | Admitting: Family Medicine

## 2021-05-28 ENCOUNTER — Telehealth (HOSPITAL_COMMUNITY): Payer: Self-pay | Admitting: *Deleted

## 2021-05-28 DIAGNOSIS — E039 Hypothyroidism, unspecified: Secondary | ICD-10-CM

## 2021-05-28 NOTE — Telephone Encounter (Signed)
[facility] sent over a request for a new rx for her quetiapine. Per record she had a new rx sent in on 4/12. I spoke with her pharmacy and she picked up on 4/17 and then again on 05/15/21. She picked up too soon, but in any case she should have medicine left till mid June and she has a return appt on 06/24/21.

## 2021-06-01 ENCOUNTER — Ambulatory Visit (INDEPENDENT_AMBULATORY_CARE_PROVIDER_SITE_OTHER): Payer: Medicare Other | Admitting: Clinical

## 2021-06-01 DIAGNOSIS — F3160 Bipolar disorder, current episode mixed, unspecified: Secondary | ICD-10-CM

## 2021-06-01 NOTE — Progress Notes (Signed)
THERAPIST PROGRESS NOTE Virtual Visit via Video Note  I connected with Jodi Gilmore on 06/01/2021 at  1:00 PM EDT by a video enabled telemedicine application and verified that I am speaking with the correct person using two identifiers.  Location: Patient: home Provider: office   I discussed the limitations of evaluation and management by telemedicine and the availability of in person appointments. The patient expressed understanding and agreed to proceed.   Follow Up Instructions: I discussed the assessment and treatment plan with the patient. The patient was provided an opportunity to ask questions and all were answered. The patient agreed with the plan and demonstrated an understanding of the instructions.   The patient was advised to call back or seek an in-person evaluation if the symptoms worsen or if the condition fails to improve as anticipated.   Session Time: 45 minutes  Participation Level: Active  Behavioral Response: CasualAlertDepressed  Type of Therapy: Individual Therapy  Treatment Goals addressed: identify 3 cognitive patterns and beliefs that support depression  ProgressTowards Goals: Not Progressing  Interventions: CBT and Supportive  Summary:  Jodi Gilmore is a 66 y.o. female who presents for the scheduled session oriented times five, appropriately dressed, and friendly. Client denied hallucinations and delusions. Client reported on today feeling the same as last appointment. Client reported 2 weeks ago she reached out to her psychiatrist about wanting to start an antidepressant. Client reported she feels her depressive symptoms are getting worse. Client reported she is not getting out of bed or showering. Client reported she has no mental energy to do things for herself. Client reported it is hard for her to admit that she herself needs help after years of helping others. Client reported her diet is mainly of cereal. Client reported she has been sick over  the past week with a cough. Client reported she understands getting up and active could help with her anxieties and some physical ailments. Client reported by history she has gotten a help to jump start things from the right medication regimen calling it a "spark". Evidence of progress towards goal:  client reported 1 coping activity that she enjoyed doing which are word games. Client reported 0 application of cognitive processing skills to help depressive symptoms.   Suicidal/Homicidal: Nowithout intent/plan  Therapist Response:  Therapist began the appointment asking the client how she has been doing since last seen. Therapist used CBT to engage using active listening and positive emotional support. Therapist used CBT to engage asking the client to describe medication compliance compared to symptoms. Therapist used CBT to ask the client to describe the severity of her depressive symptoms and behaviors. Therapist used CBT to engage with the client to brainstorm challenging her thoughts based on her ability to problem solve as shes helped others before. Therapist used CBT ask the client to identify her progress with frequency of use with coping skills with continued practice in her daily activity.    Therapist assigned the client homework to follow up with her PCP for cold symptoms and to pick 2 days she plans to shower. Client was scheduled for next appointment.     Plan: Return again in 5 weeks.  Diagnosis: bipolar affective disorder, current episode mixed ,current episode severity unspecified  Collaboration of Care: Patient refused AEB none requested  Patient/Guardian was advised Release of Information must be obtained prior to any record release in order to collaborate their care with an outside provider. Patient/Guardian was advised if they have not already done so to  contact the registration department to sign all necessary forms in order for [place] to release information regarding their  care.   Consent: Patient/Guardian gives verbal consent for treatment and assignment of benefits for services provided during this visit. Patient/Guardian expressed understanding and agreed to proceed.   Neena Rhymes Marrio Scribner, LCSW 06/01/2021

## 2021-06-05 ENCOUNTER — Telehealth (HOSPITAL_COMMUNITY): Payer: Self-pay | Admitting: *Deleted

## 2021-06-05 NOTE — Telephone Encounter (Signed)
Patient called and would like her Buspar back, states she has been taking it and not feeling amped up or hungry, thinks it might be more useful than she thought. She states she has nothing right now for anxiety. She states she is very depressed and anxious. Would also like to be on an antidepressent. Will forward this concern to [facility] .

## 2021-06-07 NOTE — Plan of Care (Signed)
  Problem: Depression CCP Problem  1  Goal: LTG: Jodi Celeste "[name]" WILL SCORE LESS THAN 10 ON THE PATIENT HEALTH QUESTIONNAIRE (PHQ-9) Outcome: Not Progressing Goal: STG: Jodi Celeste "[name]" WILL COMPLETE AT LEAST 80% OF ASSIGNED HOMEWORK Outcome: Not Progressing Goal: STG: Jodi Celeste "[name]" WILL IDENTIFY 3 COGNITIVE PATTERNS AND BELIEFS THAT SUPPORT DEPRESSION Outcome: Not Progressing   Problem: Anxiety Disorder CCP Problem  1  Goal: LTG: Patient will score less than 5 on the Generalized Anxiety Disorder 7 Scale (GAD-7) Outcome: Not Progressing Goal: STG: Patient will practice problem solving skills 3 times per week for the next 4 weeks Outcome: Not Progressing Goal: STG: Patient will reduce frequency of avoidant behaviors by 50% as evidenced by self-report in therapy sessions Outcome: Not Progressing

## 2021-06-09 ENCOUNTER — Telehealth (HOSPITAL_COMMUNITY): Payer: Self-pay | Admitting: *Deleted

## 2021-06-09 ENCOUNTER — Other Ambulatory Visit (HOSPITAL_COMMUNITY): Payer: Self-pay | Admitting: Physician Assistant

## 2021-06-09 DIAGNOSIS — F411 Generalized anxiety disorder: Secondary | ICD-10-CM

## 2021-06-09 MED ORDER — BUSPIRONE HCL 7.5 MG PO TABS: 7.5000 mg | ORAL_TABLET | Freq: Two times a day (BID) | ORAL | 1 refills | Status: DC

## 2021-06-09 NOTE — Telephone Encounter (Signed)
Message acknowledged and reviewed.

## 2021-06-09 NOTE — Progress Notes (Signed)
Provider was contacted by Glory Buff. Jodi Bowens, RN regarding patient's request for Buspar to be resent to pharmacy. Patient's medication to be e-prescribed to pharmacy of choice.

## 2021-06-09 NOTE — Telephone Encounter (Signed)
Terse VM left on writers phone that she is still waiting for her Buspar to be called into her pharmacy. Will notify Eddie PA again of her concern.

## 2021-06-09 NOTE — Telephone Encounter (Signed)
Provider was contacted by Suzanne K. Beck, RN regarding patient's request for Buspar to be resent to pharmacy. Patient's medication to be e-prescribed to pharmacy of choice.   

## 2021-06-10 ENCOUNTER — Telehealth (HOSPITAL_COMMUNITY): Payer: Self-pay | Admitting: *Deleted

## 2021-06-10 ENCOUNTER — Other Ambulatory Visit (HOSPITAL_COMMUNITY): Payer: Self-pay | Admitting: Physician Assistant

## 2021-06-10 DIAGNOSIS — F3160 Bipolar disorder, current episode mixed, unspecified: Secondary | ICD-10-CM

## 2021-06-10 MED ORDER — QUETIAPINE FUMARATE 150 MG PO TABS: 150.0000 mg | ORAL_TABLET | Freq: Every day | ORAL | 1 refills | Status: DC

## 2021-06-10 NOTE — Telephone Encounter (Signed)
[facility] faxed a request for patietns seroquel to be renewed. Per pharmacy fax she last filed on 05/15/21 and would be out this week. Her future appt with Oliver Barre s on 6/14. Will send request to her provider for a new rx to be sent to her preferred pharmacy.

## 2021-06-10 NOTE — Telephone Encounter (Signed)
Pharmacy faxed request for patients Adderall to be sent in for June. She last filled per pharmacy on 05/15/21. She will be out as of 06/14/21. Will notify Eddie PA of request.

## 2021-06-10 NOTE — Telephone Encounter (Signed)
Provider was contacted by Suzanne K Beck, RN regarding refill request for this patient.  Patient's Seroquel to be e-prescribed to pharmacy of choice 

## 2021-06-10 NOTE — Progress Notes (Signed)
Provider was contacted by Davina Poke, RN regarding refill request for this patient.  Patient's Seroquel to be e-prescribed to pharmacy of choice

## 2021-06-11 ENCOUNTER — Other Ambulatory Visit (HOSPITAL_COMMUNITY): Payer: Self-pay | Admitting: Physician Assistant

## 2021-06-11 DIAGNOSIS — F909 Attention-deficit hyperactivity disorder, unspecified type: Secondary | ICD-10-CM

## 2021-06-11 MED ORDER — AMPHETAMINE-DEXTROAMPHETAMINE 10 MG PO TABS: 10.0000 mg | ORAL_TABLET | Freq: Two times a day (BID) | ORAL | 0 refills | Status: DC

## 2021-06-11 NOTE — Telephone Encounter (Signed)
Provider was contacted by Orpah Clinton. Reola Calkins, RN regarding a request for patient's Adderall to be refilled.  Patient's prescription was last filled on 05/15/2021.  Patient's medication to be refilled 30 days from last refill date.

## 2021-06-11 NOTE — Progress Notes (Signed)
Provider was contacted by Suzanne K. Beck, RN regarding a request for patient's Adderall to be refilled.  Patient's prescription was last filled on 05/15/2021.  Patient's medication to be refilled 30 days from last refill date.

## 2021-06-16 ENCOUNTER — Encounter: Payer: Self-pay | Admitting: *Deleted

## 2021-06-24 ENCOUNTER — Telehealth (INDEPENDENT_AMBULATORY_CARE_PROVIDER_SITE_OTHER): Payer: Medicare Other | Admitting: Physician Assistant

## 2021-06-24 DIAGNOSIS — F411 Generalized anxiety disorder: Secondary | ICD-10-CM | POA: Diagnosis not present

## 2021-06-24 DIAGNOSIS — F3160 Bipolar disorder, current episode mixed, unspecified: Secondary | ICD-10-CM | POA: Diagnosis not present

## 2021-06-24 DIAGNOSIS — F909 Attention-deficit hyperactivity disorder, unspecified type: Secondary | ICD-10-CM | POA: Diagnosis not present

## 2021-06-24 MED ORDER — PAROXETINE HCL 10 MG PO TABS: ORAL_TABLET | ORAL | 1 refills | Status: DC

## 2021-06-26 ENCOUNTER — Telehealth (HOSPITAL_COMMUNITY): Payer: Self-pay | Admitting: *Deleted

## 2021-06-26 MED ORDER — AMPHETAMINE-DEXTROAMPHETAMINE 10 MG PO TABS: 10.0000 mg | ORAL_TABLET | Freq: Two times a day (BID) | ORAL | 0 refills | Status: DC

## 2021-06-26 MED ORDER — PAROXETINE HCL 10 MG PO TABS: ORAL_TABLET | ORAL | 0 refills | Status: DC

## 2021-06-26 MED ORDER — BUSPIRONE HCL 7.5 MG PO TABS: 7.5000 mg | ORAL_TABLET | Freq: Two times a day (BID) | ORAL | 2 refills | Status: DC

## 2021-06-26 MED ORDER — QUETIAPINE FUMARATE 150 MG PO TABS: 150.0000 mg | ORAL_TABLET | Freq: Every day | ORAL | 2 refills | Status: DC

## 2021-06-26 MED ORDER — PAROXETINE HCL 20 MG PO TABS: 20.0000 mg | ORAL_TABLET | Freq: Every day | ORAL | 2 refills | Status: DC

## 2021-06-26 NOTE — Telephone Encounter (Signed)
Fax request from patients pharmacy to change her Paxil rx from 10 mg to 20 mg as her insurance will not cover the 10 mg 2 a day. Will forward this request to [facility] for him to change rx and sent into [facility].

## 2021-06-26 NOTE — Telephone Encounter (Signed)
Provider was contacted by Orpah Clinton back, RN regarding insurance not covering patient's paroxetine 10 mg 2 times a day.  Patient's prescription has been changed and e-prescribed to pharmacy of choice.

## 2021-06-27 ENCOUNTER — Encounter (HOSPITAL_COMMUNITY): Payer: Self-pay | Admitting: Physician Assistant

## 2021-06-27 NOTE — Progress Notes (Cosign Needed Addendum)
[facility] MD/PA/NP OP Progress Note  Virtual Visit via Video Note  I connected with Jodi Gilmore on 06/27/21 at  5:00 PM EDT by a video enabled telemedicine application and verified that I am speaking with the correct person using two identifiers.  Location: Patient: Home Provider: Clinic   I discussed the limitations of evaluation and management by telemedicine and the availability of in person appointments. The patient expressed understanding and agreed to proceed.  Follow Up Instructions:  I discussed the assessment and treatment plan with the patient. The patient was provided an opportunity to ask questions and all were answered. The patient agreed with the plan and demonstrated an understanding of the instructions.   The patient was advised to call back or seek an in-person evaluation if the symptoms worsen or if the condition fails to improve as anticipated.  I provided  minutes of non-face-to-face time during this encounter.  Malachy Mood, PA    06/27/2021 8:47 PM Joslin Doell  MRN:  [id number]  Chief Complaint:  No chief complaint on file.  HPI:   Jodi Gilmore "[name]" is a 66 year old female with a past psychiatric history significant for bipolar disorder, attention deficit hyperactivity disorder (unspecified type), and generalized anxiety disorder who presents to [facility] via virtual video visit for follow-up and medication management.  Patient is currently being managed on the following medications:  Adderall 10 mg daily Buspirone 7.5 mg 2 times daily Seroquel 150 mg at bedtime  Patient reports that she has seen minimal improvements in her symptoms on her current medication regimen.  She does report that Seroquel has been helpful and helping her with sleep and that she has not been waking up early in the morning.  Patient continues to endorse depressive symptoms mainly characterized by avoiding activities she must do.  Patient  has also noticed an absolute lack of interest in activities, bad diet, neglecting activities of daily living, and spending most of her time just listening to podcast.  Patient endorses anxiety especially when needing to go outside her home.  Patient denies any new stressors at this time.  A PHQ-9 screen was performed with the patient scoring a 16.  A GAD-7 screen was also performed with the patient scored a 10.  Patient is alert and oriented x4, calm, cooperative, and fully engaged in conversation during the encounter.  Patient describes her mood as "shitty."  She states that she is worried about her son's upcoming marriage due to being fearful that she may not be able to attend due to her anxiety.  Patient denies suicidal or homicidal ideations.  She further denies auditory or visual hallucinations and does not appear to be responding to internal/external stimuli.  Patient endorses good sleep and receives on average 8 to 9 hours of sleep each night.  Patient endorses good appetite and eats on average 3 meals per day.  Patient denies alcohol consumption, tobacco use, and illicit drug use.  Visit Diagnosis:    ICD-10-CM   1. Attention deficit hyperactivity disorder (ADHD), unspecified ADHD type  F90.9 amphetamine-dextroamphetamine (ADDERALL) 10 MG tablet    2. Bipolar affective disorder, current episode mixed, current episode severity unspecified (HCC)  F31.60 PARoxetine (PAXIL) 10 MG tablet    QUEtiapine Fumarate 150 MG TABS    PARoxetine (PAXIL) 20 MG tablet    DISCONTINUED: PARoxetine (PAXIL) 10 MG tablet    3. Generalized anxiety disorder  F41.1 busPIRone (BUSPAR) 7.5 MG tablet    PARoxetine (PAXIL) 10 MG  tablet    PARoxetine (PAXIL) 20 MG tablet    DISCONTINUED: PARoxetine (PAXIL) 10 MG tablet      Past Psychiatric History:  Bipolar Disorder ADHD Generalized anxiety disorder Social anxiety  Past Medical History:  Past Medical History:  Diagnosis Date   Allergy    Anxiety     situational    Asthma    yeras ago - not current    Behcet's disease ([place])    Bell's palsy    Cataract    both removed    Depression    situational -    Diplopia    Elevated blood pressure reading    no BP meds currently    Family history of adverse reaction to anesthesia    GERD (gastroesophageal reflux disease)    Graves disease 1991   Heart murmur    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    Hip pain    HLA B27 (HLA B27 positive)    Hyperlipidemia    Hypertension    Knee pain    Memory changes    Morphea scleroderma    Neuromuscular disorder (HCC)    Raynauds    Pneumonia    Psoriatic arthritis ([place])    seen in ears only   Raynaud's disease    Sjogren syndrome with dental involvement ([place])     Past Surgical History:  Procedure Laterality Date   CYST REMOVAL HAND     left arm posterior   laproscopy     SALPINGECTOMY  1980   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   Procedure: LEFT TOTAL HIP ARTHROPLASTY ANTERIOR APPROACH;  Surgeon: Mcarthur Rossetti, MD;  Location: [facility];  Service: Orthopedics;  Laterality: Left;   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      Family Psychiatric History:  Nephew - Schizophrenia Father - patient suspects that her father may be bipolar  Family History:  Family History  Problem Relation Age of Onset   Heart disease Mother    Congestive Heart Failure Mother    Dementia Mother    Heart disease Father    Heart attack Father    Dementia Sister    Hemachromatosis Sister    Parkinson's disease Sister    Allergies Son    Healthy Son    Colon cancer Neg Hx    Colon polyps Neg Hx    Esophageal cancer Neg Hx    Stomach cancer Neg Hx    Rectal cancer Neg Hx     Social History:  Social History   Socioeconomic History   Marital status: Single    Spouse name: Not on file   Number of children: Not on file   Years of education: Not on file   Highest education level: Not on file  Occupational  History   Not on file  Tobacco Use   Smoking status: Former   Smokeless tobacco: Never  Vaping Use   Vaping Use: Never used  Substance and Sexual Activity   Alcohol use: Not Currently   Drug use: Never   Sexual activity: Not on file  Other Topics Concern   Not on file  Social History Narrative   Not on file   Social Determinants of Health   Financial Resource Strain: Not on file  Food Insecurity: Not on file  Transportation Needs: Not on file  Physical Activity: Not on file  Stress: Not on file  Social Connections: Not on file    Allergies:  Allergies  Allergen Reactions   Contrast Media [Iodinated Contrast Media] Anaphylaxis   Prohance [Gadoteridol] Anaphylaxis   Ptu [Propylthiouracil]    Sulfa Antibiotics     Unknown reaction, family history   Tapazole [Methimazole]    Latex Hives and Rash    Metabolic Disorder Labs: Lab Results  Component Value Date   HGBA1C 5.4 03/11/2021   No results found for: "PROLACTIN" Lab Results  Component Value Date   CHOL 272 (H) 02/12/2020   TRIG 145 02/12/2020   HDL 71 02/12/2020   CHOLHDL 3.8 02/12/2020   LDLCALC 175 (H) 02/12/2020   Lab Results  Component Value Date   TSH 0.341 (L) 03/11/2021   TSH 25.900 (H) 02/03/2021    Therapeutic Level Labs: No results found for: "LITHIUM" No results found for: "VALPROATE" No results found for: "CBMZ"  Current Medications: Current Outpatient Medications  Medication Sig Dispense Refill   PARoxetine (PAXIL) 20 MG tablet Take 1 tablet (20 mg total) by mouth daily. 30 tablet 2   acetaminophen (TYLENOL) 325 MG tablet Take 650 mg by mouth every 6 (six) hours as needed for moderate pain.     [START ON 07/11/2021] amphetamine-dextroamphetamine (ADDERALL) 10 MG tablet Take 1 tablet (10 mg total) by mouth 2 (two) times daily with a meal. 60 tablet 0   aspirin 81 MG chewable tablet Chew 1 tablet (81 mg total) by mouth 2 (two) times daily. 60 tablet 0   aspirin-acetaminophen-caffeine  (EXCEDRIN MIGRAINE) 250-250-65 MG tablet Take 2 tablets by mouth every 6 (six) hours as needed for headache.     atorvastatin (LIPITOR) 40 MG tablet TAKE ONE TABLET BY MOUTH ONE TIME DAILY 90 tablet 3   B Complex Vitamins (VITAMIN B COMPLEX PO) Take 1 tablet by mouth daily.     Bacillus Coagulans-Inulin (PROBIOTIC-PREBIOTIC PO) Take 1 capsule by mouth daily.     busPIRone (BUSPAR) 7.5 MG tablet Take 1 tablet (7.5 mg total) by mouth 2 (two) times daily. 60 tablet 2   cetirizine (ZYRTEC ALLERGY) 10 MG tablet Take 1 tablet (10 mg total) by mouth daily. 90 tablet 1   Cholecalciferol (VITAMIN D) 125 MCG (5000 UT) CAPS Take 10,000 Units by mouth daily.     diclofenac Sodium (VOLTAREN) 1 % GEL Apply 4 g topically 4 (four) times daily. (Patient taking differently: Apply 4 g topically daily as needed (pain).) 150 g 4   FEVERFEW PO Take 1 tablet by mouth 3 (three) times daily.     fluticasone (FLONASE) 50 MCG/ACT nasal spray USE TWO SPRAYS IN EACH NOSTRIL ONE TIME DAILY 16 mL 6   glucosamine-chondroitin 500-400 MG tablet Take 1 tablet by mouth 3 (three) times daily.     HYDROcodone-acetaminophen (NORCO/VICODIN) 5-325 MG tablet Take 1 tablet by mouth 3 (three) times daily as needed for moderate pain. 40 tablet 0   hydroxypropyl methylcellulose / hypromellose (ISOPTO TEARS / GONIOVISC) 2.5 % ophthalmic solution Place 1 drop into both eyes 3 (three) times daily as needed for dry eyes.     levothyroxine (SYNTHROID) 100 MCG tablet TAKE ONE TABLET BY MOUTH EVERY MORNING 30 MINUTES BEFORE FOOD 60 tablet 0   lisinopril (ZESTRIL) 2.5 MG tablet TAKE ONE TABLET BY MOUTH AT BEDTIME 90 tablet 1   Magnesium Oxide (MAG-OX 400 PO) Take 400 mg by mouth in the morning and at bedtime.     Melatonin 1 MG SUBL      methocarbamol (ROBAXIN) 500 MG tablet TAKE ONE TABLET BY MOUTH EVERY 6 HOURS AS NEEDED  FOR MUSCLE SPASM 40 tablet 1   Multiple Vitamin (MULTIVITAMIN) tablet Take 1 tablet by mouth daily.     Omega-3 Fatty Acids  (OMEGA-3 EPA FISH OIL PO) Take 3 capsules by mouth daily.     omeprazole (PRILOSEC) 20 MG capsule TAKE ONE CAPSULE BY MOUTH EVERY MORNING AND TAKE ONE CAPSULE BY MOUTH AT BEDTIME 90 capsule 0   ondansetron (ZOFRAN) 4 MG tablet Take 1 tablet (4 mg total) by mouth every 6 (six) hours as needed for nausea. 20 tablet 0   OVER THE COUNTER MEDICATION Take 4 capsules by mouth at bedtime. Calm Forte     PARoxetine (PAXIL) 10 MG tablet Patient to take paroxetine 10 mg daily for 4 days before increasing dosage to 20 mg daily. 4 tablet 0   Potassium Bicarbonate 99 MG CAPS Take 99 mg by mouth 3 (three) times daily.     QUEtiapine Fumarate 150 MG TABS Take 150 mg by mouth at bedtime. 30 tablet 2   RESTASIS 0.05 % ophthalmic emulsion INSTILL ONE DROP INTO EACH EYE TWICE DAILY 60 each 3   rizatriptan (MAXALT) 10 MG tablet TAKE ONE TABLET BY MOUTH AT ONSET OF HEADACHE. MAY REPEAT DOSE WITH ONE TABLET IN TWO HOURS IF NEEDED. DO NOT EXCEED TWO TABLETS IN 24 HOURS. 10 tablet 0   traMADol (ULTRAM) 50 MG tablet TAKE ONE TABLET BY MOUTH EVERY 6 HOURS AS NEEDED 30 tablet 0   TURMERIC PO Take 1 capsule by mouth in the morning, at noon, and at bedtime.     valACYclovir (VALTREX) 500 MG tablet TAKE ONE TABLET BY MOUTH TWICE A DAY FOR 7 DAYS 30 tablet 2   No current facility-administered medications for this visit.     Musculoskeletal: Strength & Muscle Tone: within normal limits Gait & Station: normal Patient leans: N/A  Psychiatric Specialty Exam: Review of Systems  Psychiatric/Behavioral:  Positive for decreased concentration and sleep disturbance. Negative for dysphoric mood, hallucinations, self-injury and suicidal ideas. The patient is nervous/anxious. The patient is not hyperactive.     There were no vitals taken for this visit.There is no height or weight on file to calculate BMI.  General Appearance: Casual  Eye Contact:  Good  Speech:  Clear and Coherent and Normal Rate  Volume:  Normal  Mood:   Anxious, Depressed, and Irritable  Affect:  Congruent and Depressed  Thought Process:  Coherent, Goal Directed, and Descriptions of Associations: Intact  Orientation:  Full (Time, Place, and Person)  Thought Content: WDL   Suicidal Thoughts:  No  Homicidal Thoughts:  No  Memory:  Immediate;   Fair Recent;   Fair Remote;   Fair  Judgement:  Good  Insight:  Fair  Psychomotor Activity:  Normal  Concentration:  Concentration: Good and Attention Span: Good  Recall:  Good  Fund of Knowledge: Good  Language: Good  Akathisia:  No  Handed:  Right  AIMS (if indicated): not done  Assets:  Communication Skills Desire for Improvement Housing  ADL's:  Intact  Cognition: WNL  Sleep:  Fair   Screenings: GAD-7    Flowsheet Row Video Visit from 06/24/2021 in [facility] Video Visit from 04/22/2021 in [facility] Video Visit from 03/20/2021 in [facility] Video Visit from 02/19/2021 in [facility] Video Visit from 12/16/2020 in [facility]  Total GAD-7 Score _0 [phone]  Flowsheet Row Video Visit from 06/24/2021 in [facility] Video Visit from 04/22/2021 in [facility] Video Visit from 03/20/2021 in [facility] Video Visit from 02/19/2021 in [facility] Video Visit from 12/16/2020 in [place]  PHQ-2 Total Score _0 PHQ-9 Total Score _1 Flowsheet Row Video Visit from 06/24/2021 in [facility] Counselor from 05/05/2021 in [facility] Video Visit from 04/22/2021 in [place] CATEGORY Low Risk Error: Question 6 not populated Low Risk        Assessment and Plan:   Katarzyna Wolven "[name]" is a 66 year old female with a past psychiatric history significant for bipolar disorder, attention deficit hyperactivity disorder (unspecified type), and generalized anxiety disorder who presents to [facility] via virtual video visit for follow-up and medication management.  Patient reports that she has been experiencing depressive symptoms characterized by lack of interest in activities or hobbies, poor diet, and neglecting activities of daily living.  Patient reports that she has taken antidepressants in the past for the management of her depressive symptoms and anxiety.  Patient was recommended paroxetine 10 mg for 4 days followed by 20 mg daily for the management of her depressive symptoms and anxiety.  Patient was agreeable to recommendation.  Patient's medications to be e-prescribed to pharmacy of choice.  Collaboration of Care: Collaboration of Care: Medication Management AEB provider managing patient's psychiatric medications, Psychiatrist AEB patient being followed by a mental health provider, and Referral or follow-up with counselor/therapist AEB patient seen by a licensed clinical social worker for therapy at this facility   Patient/Guardian was advised Release of Information must be obtained prior to any record release in order to collaborate their care with an outside provider. Patient/Guardian was advised if they have not already done so to contact the registration department to sign all necessary forms in order for [place] to release information regarding their care.   Consent: Patient/Guardian gives verbal consent for treatment and assignment of benefits for services provided during this visit. Patient/Guardian expressed understanding and agreed to proceed.   1. Attention deficit hyperactivity disorder (ADHD), unspecified ADHD type  - amphetamine-dextroamphetamine (ADDERALL) 10 MG tablet; Take 1 tablet (10 mg total) by mouth 2 (two) times  daily with a meal.  Dispense: 60 tablet; Refill: 0  2. Bipolar affective disorder, current episode mixed, current episode severity unspecified ([place])  - PARoxetine (PAXIL) 10 MG tablet; Patient to take paroxetine 10 mg daily for 4 days before increasing dosage to 20 mg daily.  Dispense: 4 tablet; Refill: 0 - QUEtiapine Fumarate 150 MG TABS; Take 150 mg by mouth at bedtime.  Dispense: 30 tablet; Refill: 2 - PARoxetine (PAXIL) 20 MG tablet; Take 1 tablet (20 mg total) by mouth daily.  Dispense: 30 tablet; Refill: 2  3. Generalized anxiety disorder  - busPIRone (BUSPAR) 7.5 MG tablet; Take 1 tablet (7.5 mg total) by mouth 2 (two) times daily.  Dispense: 60 tablet; Refill: 2 - PARoxetine (PAXIL) 10 MG tablet; Patient to take paroxetine 10 mg daily for 4 days before increasing dosage to 20 mg daily.  Dispense: 4 tablet; Refill: 0 - PARoxetine (PAXIL) 20 MG tablet; Take 1 tablet (20 mg total) by mouth daily.  Dispense: 30 tablet; Refill: 2  Patient to follow up in 2  months Provider spent a total of 22 minutes with the patient/reviewing patient's chart  Malachy Mood, PA 06/27/2021, 8:47 PM

## 2021-07-06 ENCOUNTER — Telehealth (HOSPITAL_COMMUNITY): Payer: Self-pay | Admitting: *Deleted

## 2021-07-06 ENCOUNTER — Telehealth: Payer: Self-pay

## 2021-07-06 MED ORDER — METHOCARBAMOL 500 MG PO TABS: ORAL_TABLET | ORAL | 1 refills | Status: DC

## 2021-07-07 ENCOUNTER — Other Ambulatory Visit (HOSPITAL_COMMUNITY): Payer: Self-pay | Admitting: Psychiatry

## 2021-07-07 ENCOUNTER — Telehealth (HOSPITAL_COMMUNITY): Payer: Self-pay | Admitting: *Deleted

## 2021-07-07 DIAGNOSIS — F411 Generalized anxiety disorder: Secondary | ICD-10-CM

## 2021-07-07 MED ORDER — BUSPIRONE HCL 7.5 MG PO TABS: 7.5000 mg | ORAL_TABLET | Freq: Two times a day (BID) | ORAL | 3 refills | Status: DC

## 2021-07-23 ENCOUNTER — Ambulatory Visit (INDEPENDENT_AMBULATORY_CARE_PROVIDER_SITE_OTHER): Payer: Medicare Other | Admitting: Clinical

## 2021-07-23 DIAGNOSIS — F411 Generalized anxiety disorder: Secondary | ICD-10-CM

## 2021-07-25 ENCOUNTER — Other Ambulatory Visit: Payer: Self-pay | Admitting: Family Medicine

## 2021-07-25 NOTE — Plan of Care (Signed)
  Problem: Depression CCP Problem  1  Goal: STG: Jodi Hashimoto "[name]" WILL COMPLETE AT LEAST 80% OF ASSIGNED HOMEWORK Outcome: Progressing Goal: STG: Jodi Hashimoto "[name]" WILL IDENTIFY 3 COGNITIVE PATTERNS AND BELIEFS THAT SUPPORT DEPRESSION Outcome: Progressing   Problem: Anxiety Disorder CCP Problem  1  Goal: LTG: Patient will score less than 5 on the Generalized Anxiety Disorder 7 Scale (GAD-7) Outcome: Progressing Goal: STG: Patient will practice problem solving skills 3 times per week for the next 4 weeks Outcome: Progressing Goal: STG: Patient will reduce frequency of avoidant behaviors by 50% as evidenced by self-report in therapy sessions Outcome: Progressing   Problem: Depression CCP Problem  1  Goal: LTG: Jodi Hashimoto "[name]" WILL SCORE LESS THAN 10 ON THE PATIENT HEALTH QUESTIONNAIRE (PHQ-9) Outcome: Not Progressing

## 2021-07-25 NOTE — Progress Notes (Signed)
THERAPIST PROGRESS NOTE Virtual Visit via Video Note  I connected with Jodi Gilmore on 07/23/2021 at 11:00 AM EDT by a video enabled telemedicine application and verified that I am speaking with the correct person using two identifiers.  Location: Patient: home Provider: office   I discussed the limitations of evaluation and management by telemedicine and the availability of in person appointments. The patient expressed understanding and agreed to proceed.   Follow Up Instructions:  I discussed the assessment and treatment plan with the patient. The patient was provided an opportunity to ask questions and all were answered. The patient agreed with the plan and demonstrated an understanding of the instructions.   The patient was advised to call back or seek an in-person evaluation if the symptoms worsen or if the condition fails to improve as anticipated.   Session Time: 45 minutes  Participation Level: Active  Behavioral Response: CasualAlertAnxious  Type of Therapy: Individual Therapy  Treatment Goals addressed: client will reduce frequency of behaviors by 50% as evidenced by self report in therapy sessions  ProgressTowards Goals: Progressing  Interventions: CBT and Supportive  Summary:  Jodi Gilmore is a 66 y.o. female who presents for the scheduled session oriented times five, appropriately dressed, and friendly. Client denied hallucinations and delusions. Client reported on today she has been feeling physically unwell with a headache. Client reported she she has been having headaches. Client reported she has a upcoming appointment with an endocrinologist which she is anxious about. Client reported she has had trouble in the past with doctors who were dismissive. Client reported she is also scared about leaving her house because when her anxiety flares it also affects her bowels. Client reported she also believes her ongoing health issues are attributed to her exposure to  mold years ago. Client reported she has had improvement with frequency of hygiene. Client reported her diet continues to be poor due to food sensitivity. Client reported she has been sleeping better and not using as much melatonin. Evidence of progress towards goal:  client reported she has showered 2x per week as previously discussed for homework. Client reported she wants to add 1 goal of getting her mail 1x per week.    Suicidal/Homicidal: Nowithout intent/plan  Therapist Response:  Therapist began the appointment asking the client how she has been doing since last seen. Therapist used CBT to engage using active listening and positive emotional support. Therapist used CBT to engage and ask the client about medication compliance and effectiveness. Therapist used CBT to engage and ask th client about her thought processing and emotions towards stressors. Therapist used CBT to engage with the client to discuss challenging negative thoughts patterns. Therapist used CBT ask the client to identify her progress with frequency of use with coping skills with continued practice in her daily activity.    Therapist assigned the client homework to practice her new identified goal. Client was scheduled for next appointment.    Plan: Return again in 5 weeks.  Diagnosis: generalized anxiety disorder  Collaboration of Care: Patient refused AEB none requested by the client.   Patient/Guardian was advised Release of Information must be obtained prior to any record release in order to collaborate their care with an outside provider. Patient/Guardian was advised if they have not already done so to contact the registration department to sign all necessary forms in order for [place] to release information regarding their care.   Consent: Patient/Guardian gives verbal consent for treatment and assignment of benefits for services  provided during this visit. Patient/Guardian expressed understanding and agreed to  proceed.   Neena Rhymes Wilber Fini, LCSW 07/23/2021

## 2021-08-03 ENCOUNTER — Ambulatory Visit: Payer: Medicare Other | Admitting: Internal Medicine

## 2021-08-03 ENCOUNTER — Other Ambulatory Visit: Payer: Self-pay | Admitting: Family Medicine

## 2021-08-03 DIAGNOSIS — E039 Hypothyroidism, unspecified: Secondary | ICD-10-CM

## 2021-08-03 NOTE — Progress Notes (Deleted)
Name: Jodi Gilmore  MRN/ DOB: [id number], 26-May-1955    Age/ Sex: 66 y.o., female    PCP: Eppie Gibson, MD   Reason for Endocrinology Evaluation: Hypothyroidism     Date of Initial Endocrinology Evaluation: 08/03/2021     HPI: Ms. Jodi Gilmore is a 66 y.o. female with a past medical history of HTN, Dyslipidemia and hypothyroidism. The patient presented for initial endocrinology clinic visit on 08/03/2021 for consultative assistance with her Hypothyroidism.   Patient has been diagnosed with hypothyroidism***   ?  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    Hip pain    HLA B27 (HLA B27 positive)    Hyperlipidemia    Hypertension    Knee pain    Memory changes    Morphea scleroderma    Neuromuscular disorder (HCC)    Raynauds    Pneumonia    Psoriatic arthritis ([place])    seen in ears only   Raynaud's disease    Sjogren syndrome with dental involvement ([place])    Past Surgical History:  Past Surgical History:  Procedure Laterality Date   CYST REMOVAL HAND     left arm posterior   laproscopy     SALPINGECTOMY  1980   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   Procedure: LEFT TOTAL HIP ARTHROPLASTY ANTERIOR APPROACH;  Surgeon: Mcarthur Rossetti, MD;  Location: [facility];  Service: Orthopedics;  Laterality: Left;   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      Social History:  reports that she has quit smoking. She has never used smokeless tobacco. She reports that she  does not currently use alcohol. She reports that she does not use drugs. Family History: family history includes Allergies in her son; Congestive Heart Failure in her mother; Dementia in her mother and sister; Healthy in her son; Heart attack in her father; Heart disease in her father and mother; Hemachromatosis in her sister; Parkinson's disease in her sister.   HOME MEDICATIONS: Allergies as of 08/03/2021       Reactions   Contrast Media [iodinated Contrast Media] Anaphylaxis   Prohance [gadoteridol] Anaphylaxis   Ptu [propylthiouracil]    Sulfa Antibiotics    Unknown reaction, family history   Tapazole [methimazole]    Latex Hives, Rash        Medication List        Accurate as of August 03, 2021  7:19 AM. If you have any questions, ask your nurse or doctor.          acetaminophen 325 MG tablet Commonly known as: TYLENOL Take 650 mg by mouth every 6 (six) hours as needed for moderate pain.   amphetamine-dextroamphetamine 10 MG tablet Commonly known as: Adderall Take 1 tablet (10 mg total) by mouth 2 (two) times daily with  a meal.   aspirin 81 MG chewable tablet Chew 1 tablet (81 mg total) by mouth 2 (two) times daily.   aspirin-acetaminophen-caffeine 250-250-65 MG tablet Commonly known as: EXCEDRIN MIGRAINE Take 2 tablets by mouth every 6 (six) hours as needed for headache.   atorvastatin 40 MG tablet Commonly known as: LIPITOR TAKE ONE TABLET BY MOUTH ONE TIME DAILY   busPIRone 7.5 MG tablet Commonly known as: BUSPAR Take 1 tablet (7.5 mg total) by mouth 2 (two) times daily.   cetirizine 10 MG tablet Commonly known as: ZyrTEC Allergy Take 1 tablet (10 mg total) by mouth daily.   diclofenac Sodium 1 % Gel Commonly known as: Voltaren Apply 4 g topically 4 (four) times daily. What changed:  when to take this reasons to take this   FEVERFEW PO Take 1 tablet by mouth 3 (three) times daily.   fluticasone 50 MCG/ACT nasal spray Commonly known as:  FLONASE USE TWO SPRAYS IN EACH NOSTRIL ONE TIME DAILY   glucosamine-chondroitin 500-400 MG tablet Take 1 tablet by mouth 3 (three) times daily.   HYDROcodone-acetaminophen 5-325 MG tablet Commonly known as: NORCO/VICODIN Take 1 tablet by mouth 3 (three) times daily as needed for moderate pain.   hydroxypropyl methylcellulose / hypromellose 2.5 % ophthalmic solution Commonly known as: ISOPTO TEARS / GONIOVISC Place 1 drop into both eyes 3 (three) times daily as needed for dry eyes.   levothyroxine 100 MCG tablet Commonly known as: SYNTHROID TAKE ONE TABLET BY MOUTH EVERY MORNING 30 MINUTES BEFORE FOOD   lisinopril 2.5 MG tablet Commonly known as: ZESTRIL TAKE ONE TABLET BY MOUTH AT BEDTIME   MAG-OX 400 PO Take 400 mg by mouth in the morning and at bedtime.   Melatonin 1 MG Subl   methocarbamol 500 MG tablet Commonly known as: ROBAXIN TAKE ONE TABLET BY MOUTH EVERY 6 HOURS AS NEEDED FOR MUSCLE SPASM   multivitamin tablet Take 1 tablet by mouth daily.   OMEGA-3 EPA FISH OIL PO Take 3 capsules by mouth daily.   omeprazole 20 MG capsule Commonly known as: PRILOSEC TAKE ONE CAPSULE BY MOUTH EVERY MORNING AND TAKE ONE CAPSULE BY MOUTH AT BEDTIME   ondansetron 4 MG tablet Commonly known as: ZOFRAN Take 1 tablet (4 mg total) by mouth every 6 (six) hours as needed for nausea.   OVER THE COUNTER MEDICATION Take 4 capsules by mouth at bedtime. Calm Forte   PARoxetine 10 MG tablet Commonly known as: PAXIL Patient to take paroxetine 10 mg daily for 4 days before increasing dosage to 20 mg daily.   PARoxetine 20 MG tablet Commonly known as: PAXIL Take 1 tablet (20 mg total) by mouth daily.   Potassium Bicarbonate 99 MG Caps Take 99 mg by mouth 3 (three) times daily.   PROBIOTIC-PREBIOTIC PO Take 1 capsule by mouth daily.   QUEtiapine Fumarate 150 MG Tabs Take 150 mg by mouth at bedtime.   Restasis 0.05 % ophthalmic emulsion Generic drug: cycloSPORINE INSTILL ONE  DROP INTO EACH EYE TWICE DAILY   rizatriptan 10 MG tablet Commonly known as: MAXALT TAKE ONE TABLET BY MOUTH AT ONSET OF HEADACHE. MAY REPEAT DOSE WITH ONE TABLET IN TWO HOURS IF NEEDED. DO NOT EXCEED TWO TABLETS IN 24 HOURS.   traMADol 50 MG tablet Commonly known as: ULTRAM TAKE ONE TABLET BY MOUTH EVERY 6 HOURS AS NEEDED   TURMERIC PO Take 1 capsule by mouth in the morning, at noon, and at bedtime.   valACYclovir 500 MG tablet Commonly known  as: VALTREX TAKE ONE TABLET BY MOUTH TWICE A DAY FOR 7 DAYS   VITAMIN B COMPLEX PO Take 1 tablet by mouth daily.   Vitamin D 125 MCG (5000 UT) Caps Take 10,000 Units by mouth daily.          REVIEW OF SYSTEMS: A comprehensive ROS was conducted with the patient and is negative except as per HPI and below:  ROS     OBJECTIVE:  VS: There were no vitals taken for this visit.   Wt Readings from Last 3 Encounters:  10/17/20 168 lb (76.2 kg)  10/08/20 168 lb (76.2 kg)  10/07/20 165 lb (74.8 kg)     EXAM: General: Pt appears well and is in NAD  Hydration: Well-hydrated with moist mucous membranes and good skin turgor  Eyes: External eye exam normal without stare, lid lag or exophthalmos.  EOM intact.  PERRL.  Ears, Nose, Throat: Hearing: Grossly intact bilaterally Dental: Good dentition  Throat: Clear without mass, erythema or exudate  Neck: General: Supple without adenopathy. Thyroid: Thyroid size normal.  No goiter or nodules appreciated. No thyroid bruit.  Lungs: Clear with good BS bilat with no rales, rhonchi, or wheezes  Heart: Auscultation: RRR.  Abdomen: Normoactive bowel sounds, soft, nontender, without masses or organomegaly palpable  Extremities: Gait and station: Normal gait  Digits and nails: No clubbing, cyanosis, petechiae, or nodes Head and neck: Normal alignment and mobility BL UE: Normal ROM and strength. BL LE: No pretibial edema normal ROM and strength.  Skin: Hair: Texture and amount normal with gender  appropriate distribution Skin Inspection: No rashes, acanthosis nigricans/skin tags. No lipohypertrophy Skin Palpation: Skin temperature, texture, and thickness normal to palpation  Neuro: Cranial nerves: II - XII grossly intact  Cerebellar: Normal coordination and movement; no tremor Motor: Normal strength throughout DTRs: 2+ and symmetric in UE without delay in relaxation phase  Mental Status: Judgment, insight: Intact Orientation: Oriented to time, place, and person Memory: Intact for recent and remote events Mood and affect: No depression, anxiety, or agitation     DATA REVIEWED: ***    ASSESSMENT/PLAN/RECOMMENDATIONS:   ***    Medications :  Signed electronically by: Mack Guise, MD  [facility]  [address] [place], [place] [address] Phone: [phone] FAX: [phone]   CC: Eppie Gibson, MD [place] [address] Phone: [phone] Fax: [phone]   Return to Endocrinology clinic as below: Future Appointments  Date Time Provider [place]  08/03/2021 10:50 AM Victormanuel Mclure, Melanie Crazier, MD [facility] None  08/26/2021  5:00 PM [place], Terese Door, PA [facility] None

## 2021-08-04 ENCOUNTER — Other Ambulatory Visit (HOSPITAL_COMMUNITY): Payer: Self-pay | Admitting: Student in an Organized Health Care Education/Training Program

## 2021-08-04 ENCOUNTER — Telehealth (HOSPITAL_COMMUNITY): Payer: Self-pay | Admitting: *Deleted

## 2021-08-04 ENCOUNTER — Other Ambulatory Visit: Payer: Self-pay | Admitting: Family Medicine

## 2021-08-04 DIAGNOSIS — F909 Attention-deficit hyperactivity disorder, unspecified type: Secondary | ICD-10-CM

## 2021-08-04 MED ORDER — AMPHETAMINE-DEXTROAMPHETAMINE 10 MG PO TABS: 10.0000 mg | ORAL_TABLET | Freq: Two times a day (BID) | ORAL | 0 refills | Status: DC

## 2021-08-04 NOTE — Telephone Encounter (Signed)
Jodi Gilmore is Out of Office Sending Refill Request to  Dr. Antony Salmon  Patient called Requested Refill::amphetamine-dextroamphetamine (ADDERALL) 10 MG tablet Take 1 tablet (10 mg total) by mouth 2 (two) times daily with a meal   Next Appt- 08/26/21

## 2021-08-26 ENCOUNTER — Telehealth (INDEPENDENT_AMBULATORY_CARE_PROVIDER_SITE_OTHER): Payer: Medicare Other | Admitting: Physician Assistant

## 2021-08-26 DIAGNOSIS — F3160 Bipolar disorder, current episode mixed, unspecified: Secondary | ICD-10-CM

## 2021-08-26 DIAGNOSIS — F411 Generalized anxiety disorder: Secondary | ICD-10-CM

## 2021-08-26 DIAGNOSIS — F909 Attention-deficit hyperactivity disorder, unspecified type: Secondary | ICD-10-CM | POA: Diagnosis not present

## 2021-08-27 ENCOUNTER — Encounter (HOSPITAL_COMMUNITY): Payer: Self-pay | Admitting: Physician Assistant

## 2021-08-27 MED ORDER — PAROXETINE HCL 20 MG PO TABS: 20.0000 mg | ORAL_TABLET | Freq: Every day | ORAL | 2 refills | Status: DC

## 2021-08-27 MED ORDER — AMPHETAMINE-DEXTROAMPHETAMINE 10 MG PO TABS: 10.0000 mg | ORAL_TABLET | Freq: Two times a day (BID) | ORAL | 0 refills | Status: DC

## 2021-08-27 MED ORDER — BUSPIRONE HCL 10 MG PO TABS: 10.0000 mg | ORAL_TABLET | Freq: Two times a day (BID) | ORAL | 2 refills | Status: DC

## 2021-08-27 MED ORDER — QUETIAPINE FUMARATE 150 MG PO TABS: 150.0000 mg | ORAL_TABLET | Freq: Every day | ORAL | 2 refills | Status: DC

## 2021-08-27 NOTE — Progress Notes (Signed)
[facility] MD/PA/NP OP Progress Note  Virtual Visit via Video Note  I connected with Jodi Gilmore on 08/27/21 at  5:00 PM EDT by a video enabled telemedicine application and verified that I am speaking with the correct person using two identifiers.  Location: Patient: Home Provider: Clinic   I discussed the limitations of evaluation and management by telemedicine and the availability of in person appointments. The patient expressed understanding and agreed to proceed.  Follow Up Instructions:  I discussed the assessment and treatment plan with the patient. The patient was provided an opportunity to ask questions and all were answered. The patient agreed with the plan and demonstrated an understanding of the instructions.   The patient was advised to call back or seek an in-person evaluation if the symptoms worsen or if the condition fails to improve as anticipated.  I provided 17 minutes of non-face-to-face time during this encounter.  Malachy Mood, PA    08/27/2021 11:11 PM Zeriah Baysinger  MRN:  [id number]  Chief Complaint:  Chief Complaint  Patient presents with   Follow-up   Medication Management   HPI:   Jodi Gilmore "[name]" is a 66 year old female with a past psychiatric history significant for bipolar disorder, attention deficit hyperactivity disorder (unspecified type), and generalized anxiety disorder who presents to [facility] via virtual video visit for follow-up and medication management.  Patient is currently being managed on the following medications:  Adderall 10 mg daily Buspirone 7.5 mg 2 times daily Seroquel 150 mg at bedtime Paroxetine 20 mg daily  Patient reports that she has been experiencing headaches since starting paroxetine.  Although she attributes her headaches to her paroxetine use, patient reports that she also stopped taking a lot of supplements that were geared towards managing her past headaches.  Patient  denies any remarkable changes in her depressive episodes but states that Seroquel has been helpful with managing her irritability.  Patient states that she has remained somewhat stable since the last encounter.  Patient states that her anxiety is relatively low when at home but whenever she goes out, her anxiety is elevated.  A PHQ-9 screen was performed with the patient scoring an 18.  A GAD-7 screen was also performed and the patient scoring a 9.  Patient is alert and oriented x4, calm, cooperative, and fully engaged in conversation during the encounter.  Patient expresses being very tired along with having a lack of energy.  Patient denies suicidal or homicidal ideations.  She further denies auditory or visual hallucinations and does not appear to be responding to internal/external stimuli.  Patient endorses sleeping way too much.  Patient endorses fair appetite stating that she eats 2 meals per day along with 3 snacks.  Patient denies alcohol consumption, tobacco use, and illicit drug use.  Visit Diagnosis:    ICD-10-CM   1. Generalized anxiety disorder  F41.1 busPIRone (BUSPAR) 10 MG tablet    PARoxetine (PAXIL) 20 MG tablet    2. Bipolar affective disorder, current episode mixed, current episode severity unspecified ([place])  F31.60 PARoxetine (PAXIL) 20 MG tablet    QUEtiapine Fumarate 150 MG TABS    3. Attention deficit hyperactivity disorder (ADHD), unspecified ADHD type  F90.9 amphetamine-dextroamphetamine (ADDERALL) 10 MG tablet      Past Psychiatric History:  Bipolar Disorder ADHD Generalized anxiety disorder Social anxiety  Past Medical History:  Past Medical History:  Diagnosis Date   Allergy    Anxiety    situational    Asthma  yeras ago - not current    Behcet's disease ([place])    Bell's palsy    Cataract    both removed    Depression    situational -    Diplopia    Elevated blood pressure reading    no BP meds currently    Family history of adverse reaction to  anesthesia    GERD (gastroesophageal reflux disease)    Graves disease 1991   Heart murmur    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    Hip pain    HLA B27 (HLA B27 positive)    Hyperlipidemia    Hypertension    Knee pain    Memory changes    Morphea scleroderma    Neuromuscular disorder ([place])    Raynauds    Pneumonia    Psoriatic arthritis ([place])    seen in ears only   Raynaud's disease    Sjogren syndrome with dental involvement ([place])     Past Surgical History:  Procedure Laterality Date   CYST REMOVAL HAND     left arm posterior   laproscopy     SALPINGECTOMY  1980   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   Procedure: LEFT TOTAL HIP ARTHROPLASTY ANTERIOR APPROACH;  Surgeon: Mcarthur Rossetti, MD;  Location: [facility];  Service: Orthopedics;  Laterality: Left;   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      Family Psychiatric History:  Nephew - Schizophrenia Father - patient suspects that her father may be bipolar  Family History:  Family History  Problem Relation Age of Onset   Heart disease Mother    Congestive Heart Failure Mother    Dementia Mother    Heart disease Father    Heart attack Father    Dementia Sister    Hemachromatosis Sister    Parkinson's disease Sister    Allergies Son    Healthy Son    Colon cancer Neg Hx    Colon polyps Neg Hx    Esophageal cancer Neg Hx    Stomach cancer Neg Hx    Rectal cancer Neg Hx     Social History:  Social History   Socioeconomic History   Marital status: Single    Spouse name: Not on file   Number of children: Not on file   Years of education: Not on file   Highest education level: Not on file  Occupational History   Not on file  Tobacco Use   Smoking status: Former   Smokeless tobacco: Never  Vaping Use   Vaping Use: Never used  Substance and Sexual Activity   Alcohol use: Not Currently   Drug use: Never   Sexual activity: Not on file  Other Topics Concern   Not on  file  Social History Narrative   Not on file   Social Determinants of Health   Financial Resource Strain: Not on file  Food Insecurity: Not on file  Transportation Needs: Not on file  Physical Activity: Not on file  Stress: Not on file  Social Connections: Not on file    Allergies:  Allergies  Allergen Reactions   Contrast Media [Iodinated Contrast Media] Anaphylaxis   Prohance [Gadoteridol] Anaphylaxis   Ptu [Propylthiouracil]    Sulfa Antibiotics     Unknown reaction, family history   Tapazole [Methimazole]    Latex Hives and Rash    Metabolic Disorder Labs: Lab Results  Component Value Date   HGBA1C 5.4 03/11/2021  No results found for: "PROLACTIN" Lab Results  Component Value Date   CHOL 272 (H) 02/12/2020   TRIG 145 02/12/2020   HDL 71 02/12/2020   CHOLHDL 3.8 02/12/2020   LDLCALC 175 (H) 02/12/2020   Lab Results  Component Value Date   TSH 0.341 (L) 03/11/2021   TSH 25.900 (H) 02/03/2021    Therapeutic Level Labs: No results found for: "LITHIUM" No results found for: "VALPROATE" No results found for: "CBMZ"  Current Medications: Current Outpatient Medications  Medication Sig Dispense Refill   acetaminophen (TYLENOL) 325 MG tablet Take 650 mg by mouth every 6 (six) hours as needed for moderate pain.     [START ON 09/03/2021] amphetamine-dextroamphetamine (ADDERALL) 10 MG tablet Take 1 tablet (10 mg total) by mouth 2 (two) times daily with a meal. 60 tablet 0   aspirin 81 MG chewable tablet Chew 1 tablet (81 mg total) by mouth 2 (two) times daily. 60 tablet 0   aspirin-acetaminophen-caffeine (EXCEDRIN MIGRAINE) 250-250-65 MG tablet Take 2 tablets by mouth every 6 (six) hours as needed for headache.     atorvastatin (LIPITOR) 40 MG tablet TAKE ONE TABLET BY MOUTH ONE TIME DAILY 90 tablet 3   B Complex Vitamins (VITAMIN B COMPLEX PO) Take 1 tablet by mouth daily.     Bacillus Coagulans-Inulin (PROBIOTIC-PREBIOTIC PO) Take 1 capsule by mouth daily.      busPIRone (BUSPAR) 10 MG tablet Take 1 tablet (10 mg total) by mouth 2 (two) times daily. 30 tablet 2   cetirizine (ZYRTEC ALLERGY) 10 MG tablet Take 1 tablet (10 mg total) by mouth daily. 90 tablet 1   Cholecalciferol (VITAMIN D) 125 MCG (5000 UT) CAPS Take 10,000 Units by mouth daily.     diclofenac Sodium (VOLTAREN) 1 % GEL Apply 4 g topically 4 (four) times daily. (Patient taking differently: Apply 4 g topically daily as needed (pain).) 150 g 4   FEVERFEW PO Take 1 tablet by mouth 3 (three) times daily.     fluticasone (FLONASE) 50 MCG/ACT nasal spray USE TWO SPRAYS IN EACH NOSTRIL ONE TIME DAILY 16 mL 6   glucosamine-chondroitin 500-400 MG tablet Take 1 tablet by mouth 3 (three) times daily.     HYDROcodone-acetaminophen (NORCO/VICODIN) 5-325 MG tablet Take 1 tablet by mouth 3 (three) times daily as needed for moderate pain. 40 tablet 0   hydroxypropyl methylcellulose / hypromellose (ISOPTO TEARS / GONIOVISC) 2.5 % ophthalmic solution Place 1 drop into both eyes 3 (three) times daily as needed for dry eyes.     levothyroxine (SYNTHROID) 100 MCG tablet TAKE ONE TABLET BY MOUTH EVERY MORNING 30 MINUTES BEFORE FOOD 60 tablet 0   lisinopril (ZESTRIL) 2.5 MG tablet TAKE ONE TABLET BY MOUTH AT BEDTIME 90 tablet 1   Magnesium Oxide (MAG-OX 400 PO) Take 400 mg by mouth in the morning and at bedtime.     Melatonin 1 MG SUBL      methocarbamol (ROBAXIN) 500 MG tablet TAKE ONE TABLET BY MOUTH EVERY 6 HOURS AS NEEDED FOR MUSCLE SPASM 40 tablet 1   Multiple Vitamin (MULTIVITAMIN) tablet Take 1 tablet by mouth daily.     Omega-3 Fatty Acids (OMEGA-3 EPA FISH OIL PO) Take 3 capsules by mouth daily.     omeprazole (PRILOSEC) 20 MG capsule TAKE ONE CAPSULE BY MOUTH EVERY MORNING AND TAKE ONE CAPSULE BY MOUTH AT BEDTIME 90 capsule 0   ondansetron (ZOFRAN) 4 MG tablet Take 1 tablet (4 mg total) by mouth every 6 (six) hours as  needed for nausea. 20 tablet 0   OVER THE COUNTER MEDICATION Take 4 capsules by mouth  at bedtime. Calm Forte     PARoxetine (PAXIL) 20 MG tablet Take 1 tablet (20 mg total) by mouth daily. 30 tablet 2   Potassium Bicarbonate 99 MG CAPS Take 99 mg by mouth 3 (three) times daily.     QUEtiapine Fumarate 150 MG TABS Take 150 mg by mouth at bedtime. 30 tablet 2   RESTASIS 0.05 % ophthalmic emulsion INSTILL ONE DROP INTO EACH EYE TWICE DAILY 60 each 3   rizatriptan (MAXALT) 10 MG tablet TAKE ONE TABLET BY MOUTH AT ONSET OF HEADACHE. MAY REPEAT DOSE WITH ONE TABLET IN TWO HOURS IF NEEDED. DO NOT EXCEED TWO TABLETS IN 24 HOURS. 10 tablet 0   traMADol (ULTRAM) 50 MG tablet TAKE ONE TABLET BY MOUTH EVERY 6 HOURS AS NEEDED 30 tablet 0   TURMERIC PO Take 1 capsule by mouth in the morning, at noon, and at bedtime.     valACYclovir (VALTREX) 500 MG tablet TAKE ONE TABLET BY MOUTH TWICE A DAY FOR 7 DAYS 30 tablet 2   No current facility-administered medications for this visit.     Musculoskeletal: Strength & Muscle Tone: within normal limits Gait & Station: normal Patient leans: N/A  Psychiatric Specialty Exam: Review of Systems  Psychiatric/Behavioral:  Positive for sleep disturbance. Negative for decreased concentration, dysphoric mood, hallucinations, self-injury and suicidal ideas. The patient is nervous/anxious. The patient is not hyperactive.     There were no vitals taken for this visit.There is no height or weight on file to calculate BMI.  General Appearance: Casual  Eye Contact:  Good  Speech:  Clear and Coherent and Normal Rate  Volume:  Normal  Mood:  Anxious and Depressed  Affect:  Congruent and Depressed  Thought Process:  Coherent, Goal Directed, and Descriptions of Associations: Intact  Orientation:  Full (Time, Place, and Person)  Thought Content: WDL   Suicidal Thoughts:  No  Homicidal Thoughts:  No  Memory:  Immediate;   Fair Recent;   Fair Remote;   Fair  Judgement:  Good  Insight:  Fair  Psychomotor Activity:  Normal  Concentration:  Concentration: Good  and Attention Span: Good  Recall:  Good  Fund of Knowledge: Good  Language: Good  Akathisia:  No  Handed:  Right  AIMS (if indicated): not done  Assets:  Communication Skills Desire for Improvement Housing  ADL's:  Intact  Cognition: WNL  Sleep:  Fair   Screenings: GAD-7    Flowsheet Row Video Visit from 08/26/2021 in [facility] Video Visit from 06/24/2021 in [facility] Video Visit from 04/22/2021 in [facility] Video Visit from 03/20/2021 in [facility] Video Visit from 02/19/2021 in [facility]  Total GAD-7 Score _0 ZTI4-5    Flowsheet Row Video Visit from 08/26/2021 in [facility] Video Visit from 06/24/2021 in [facility] Video Visit from 04/22/2021 in [facility] Video Visit from 03/20/2021 in [facility] Video Visit from 02/19/2021 in [facility]  PHQ-2 Total Score _1 PHQ-9 Total Score _2 Flowsheet Row Video Visit from 08/26/2021 in [facility] Video Visit from 06/24/2021 in  [facility] Counselor from 05/05/2021 in [place] RISK CATEGORY Low Risk Low Risk Error: Question 6 not populated        Assessment and Plan:   Andora Krull "[name]" is a 66 year old female with a past psychiatric history significant for bipolar disorder, attention deficit hyperactivity disorder (unspecified type), and generalized anxiety disorder who presents to [facility] via virtual video visit for follow-up and medication management.  Patient reports that she is relatively stable.  Since taking her paroxetine, patient states that she has experienced  headaches.  Patient denies any remarkable changes in her depression and states that her anxiety is still elevated when going out in public.  Patient is interested in increasing her dosage of buspirone in an effort to control her anxiety.  Patient's buspirone to be increased from 7.25m to 10 mg 2 times daily.  Patient is agreeable to recommendation.  Patient's medications to be prescribed to pharmacy of choice.  Collaboration of Care: Collaboration of Care: Medication Management AEB provider managing patient's psychiatric medications, Psychiatrist AEB patient being followed by a mental health provider, and Referral or follow-up with counselor/therapist AEB patient seen by a licensed clinical social worker for therapy at this facility   Patient/Guardian was advised Release of Information must be obtained prior to any record release in order to collaborate their care with an outside provider. Patient/Guardian was advised if they have not already done so to contact the registration department to sign all necessary forms in order for [place] release information regarding their care.   Consent: Patient/Guardian gives verbal consent for treatment and assignment of benefits for services provided during this visit. Patient/Guardian expressed understanding and agreed to proceed.   1. Generalized anxiety disorder  - busPIRone (BUSPAR) 10 MG tablet; Take 1 tablet (10 mg total) by mouth 2 (two) times daily.  Dispense: 30 tablet; Refill: 2 - PARoxetine (PAXIL) 20 MG tablet; Take 1 tablet (20 mg total) by mouth daily.  Dispense: 30 tablet; Refill: 2  2. Bipolar affective disorder, current episode mixed, current episode severity unspecified ([facility])  - PARoxetine (PAXIL) 20 MG tablet; Take 1 tablet (20 mg total) by mouth daily.  Dispense: 30 tablet; Refill: 2 - QUEtiapine Fumarate 150 MG TABS; Take 150 mg by mouth at bedtime.  Dispense: 30 tablet; Refill: 2  3. Attention deficit hyperactivity disorder (ADHD),  unspecified ADHD type  - amphetamine-dextroamphetamine (ADDERALL) 10 MG tablet; Take 1 tablet (10 mg total) by mouth 2 (two) times daily with a meal.  Dispense: 60 tablet; Refill: 0  Patient to follow up in 2 months Provider spent a total of 17 minutes with the patient/reviewing patient's chart  UMalachy Mood PA 08/27/2021, 11:11 PM

## 2021-09-02 ENCOUNTER — Other Ambulatory Visit: Payer: Self-pay

## 2021-09-02 MED ORDER — METHOCARBAMOL 500 MG PO TABS: ORAL_TABLET | ORAL | 1 refills | Status: DC

## 2021-09-12 ENCOUNTER — Other Ambulatory Visit: Payer: Self-pay | Admitting: Student

## 2021-09-29 ENCOUNTER — Telehealth (HOSPITAL_COMMUNITY): Payer: Self-pay | Admitting: *Deleted

## 2021-09-29 NOTE — Telephone Encounter (Signed)
[facility] request from [facility] for a new rx for her adderall. She should have a few remaining days but will be out before she has her return appt with Eddie PA on 10/1//23. Will forward this rx request to the provider.

## 2021-09-30 ENCOUNTER — Other Ambulatory Visit (HOSPITAL_COMMUNITY): Payer: Self-pay | Admitting: Physician Assistant

## 2021-09-30 DIAGNOSIS — F909 Attention-deficit hyperactivity disorder, unspecified type: Secondary | ICD-10-CM

## 2021-09-30 MED ORDER — AMPHETAMINE-DEXTROAMPHETAMINE 10 MG PO TABS: 10.0000 mg | ORAL_TABLET | Freq: Two times a day (BID) | ORAL | 0 refills | Status: DC

## 2021-09-30 NOTE — Telephone Encounter (Signed)
Provider was contacted by Glory Buff. Olevia Bowens, RN regarding patient's request for Adderall refill.  Patient's last prescription was refilled on 09/03/2021.  Provider to refill patient's Adderall prescription for 10/03/2021.  Provider to refill future prescriptions for patient's Adderall a month apart from each other.

## 2021-09-30 NOTE — Progress Notes (Signed)
Provider was contacted by Suzanne K. Beck, RN regarding patient's request for Adderall refill.  Patient's last prescription was refilled on 09/03/2021.  Provider to refill patient's Adderall prescription for 10/03/2021.  Provider to refill future prescriptions for patient's Adderall a month apart from each other.

## 2021-10-02 ENCOUNTER — Ambulatory Visit (INDEPENDENT_AMBULATORY_CARE_PROVIDER_SITE_OTHER): Payer: Medicare Other | Admitting: Clinical

## 2021-10-02 DIAGNOSIS — F411 Generalized anxiety disorder: Secondary | ICD-10-CM | POA: Diagnosis not present

## 2021-10-02 NOTE — Plan of Care (Signed)
  Problem: Depression CCP Problem  1  Goal: STG: Jodi Celeste "[name]" WILL COMPLETE AT LEAST 80% OF ASSIGNED HOMEWORK Outcome: Progressing Goal: STG: Jodi Celeste "[name]" WILL IDENTIFY 3 COGNITIVE PATTERNS AND BELIEFS THAT SUPPORT DEPRESSION Outcome: Progressing   Problem: Anxiety Disorder CCP Problem  1  Goal: STG: Patient will practice problem solving skills 3 times per week for the next 4 weeks Outcome: Progressing Goal: STG: Patient will reduce frequency of avoidant behaviors by 50% as evidenced by self-report in therapy sessions Outcome: Progressing   Problem: Depression CCP Problem  1  Goal: LTG: Jodi Celeste "[name]" WILL SCORE LESS THAN 10 ON THE PATIENT HEALTH QUESTIONNAIRE (PHQ-9) Outcome: Not Progressing   Problem: Anxiety Disorder CCP Problem  1  Goal: LTG: Patient will score less than 5 on the Generalized Anxiety Disorder 7 Scale (GAD-7) Outcome: Not Progressing

## 2021-10-02 NOTE — Progress Notes (Signed)
THERAPIST PROGRESS NOTE Virtual Visit via Video Note  I connected with Jodi Gilmore on 10/02/21 at  8:00 AM EDT by a video enabled telemedicine application and verified that I am speaking with the correct person using two identifiers.  Location: Patient: home Provider: office   I discussed the limitations of evaluation and management by telemedicine and the availability of in person appointments. The patient expressed understanding and agreed to proceed.   Follow Up Instructions: I discussed the assessment and treatment plan with the patient. The patient was provided an opportunity to ask questions and all were answered. The patient agreed with the plan and demonstrated an understanding of the instructions.   The patient was advised to call back or seek an in-person evaluation if the symptoms worsen or if the condition fails to improve as anticipated.   Session Time: 45 minutes  Participation Level: Active  Behavioral Response: CasualAlertAnxious  Type of Therapy: Individual Therapy  Treatment Goals addressed: Client will complete at least 80% of assigned homework  ProgressTowards Goals: Progressing  Interventions: CBT and Supportive  Summary:  Jodi Gilmore is a 66 y.o. female who presents for the scheduled appointment oriented x5, appropriately dressed, and friendly.  Client denied hallucinations and delusions. Client reported on today she has been trying to maintain herself physically and emotionally fairly well.  Client reported since she was last seen as she previously noted she had an upcoming appointment with the endocrinologist regarding her thyroid.  Client reported as she has struggled with by history is getting physically sick prior to medical appointments which occurred on her appointment date.  Client reported due to her canceling and rescheduling appointments she was told that she cannot come back to that practice as a patient.  Client reported she will have to  get in contact with her PCP about doing some test and other specialist that she could be seen by.  Client reported she has also fallen a few times and now struggles with some pain.  Client reported she has been resting but having a hard time with staying asleep at night.  Client reported positively she was able to get more of the financial tasks done regarding her parents estate.  Client reported also her self-care/hygiene have increased during the week.  Client also reported she has been in contact with her son and he plans to come visit her soon so they can have a birthday dinner together and discuss her needs for care.  Client reported she has a difficult time with getting her mindset to a point of motivation to do things but she has made a lot of progress with organizing her house. Evidence of progress towards goal: Client reported she is now showering at least 2-3 times per week.  Suicidal/Homicidal: Nowithout intent/plan  Therapist Response:  Therapist began the appointment asking the client how she has been doing since last seen. Therapist used CBT to engage using active listening and positive emotional support. Therapist used CBT to ask client open-ended questions about psychosocial stressors that continue to negatively impact her depressive and anxiety symptoms. Therapist used CBT to engage and discuss coping with anxiety and how to reframe thoughts to be more productive to desired outcomes. Therapist used CBT is asked the client to identify positive progress she is made. Therapist used CBT ask the client to identify her progress with frequency of use with coping skills with continued practice in her daily activity.    Therapist assigned to client homework to continue her set  goals related to hygiene and reaching out to additional services that she needs regarding her health and self-care.   Plan: Return again in 4 weeks.  Diagnosis: Generalized anxiety disorder  Collaboration of Care:  Patient refused AEB no other needs requested by the client at this time.  Patient/Guardian was advised Release of Information must be obtained prior to any record release in order to collaborate their care with an outside provider. Patient/Guardian was advised if they have not already done so to contact the registration department to sign all necessary forms in order for [place] to release information regarding their care.   Consent: Patient/Guardian gives verbal consent for treatment and assignment of benefits for services provided during this visit. Patient/Guardian expressed understanding and agreed to proceed.   [place], LCSW 10/02/2021

## 2021-10-12 ENCOUNTER — Other Ambulatory Visit (HOSPITAL_COMMUNITY): Payer: Self-pay | Admitting: Psychiatry

## 2021-10-12 ENCOUNTER — Other Ambulatory Visit: Payer: Self-pay | Admitting: Student

## 2021-10-12 ENCOUNTER — Telehealth (HOSPITAL_COMMUNITY): Payer: Self-pay

## 2021-10-12 DIAGNOSIS — F411 Generalized anxiety disorder: Secondary | ICD-10-CM

## 2021-10-12 DIAGNOSIS — E039 Hypothyroidism, unspecified: Secondary | ICD-10-CM

## 2021-10-12 MED ORDER — BUSPIRONE HCL 10 MG PO TABS: 10.0000 mg | ORAL_TABLET | Freq: Two times a day (BID) | ORAL | 3 refills | Status: DC

## 2021-10-12 NOTE — Telephone Encounter (Signed)
Buspar refilled and sent to preferred pharmacy.

## 2021-10-15 ENCOUNTER — Telehealth: Payer: Self-pay

## 2021-10-15 ENCOUNTER — Other Ambulatory Visit: Payer: Self-pay | Admitting: Physician Assistant

## 2021-10-15 ENCOUNTER — Other Ambulatory Visit: Payer: Self-pay | Admitting: Student

## 2021-10-15 ENCOUNTER — Telehealth (HOSPITAL_COMMUNITY): Payer: Self-pay | Admitting: *Deleted

## 2021-10-15 ENCOUNTER — Other Ambulatory Visit (HOSPITAL_COMMUNITY): Payer: Self-pay | Admitting: Psychiatry

## 2021-10-15 DIAGNOSIS — F411 Generalized anxiety disorder: Secondary | ICD-10-CM

## 2021-10-15 MED ORDER — BUSPIRONE HCL 10 MG PO TABS: 10.0000 mg | ORAL_TABLET | Freq: Two times a day (BID) | ORAL | 3 refills | Status: DC

## 2021-10-15 MED ORDER — METHOCARBAMOL 500 MG PO TABS: ORAL_TABLET | ORAL | 1 refills | Status: DC

## 2021-10-15 NOTE — Telephone Encounter (Signed)
[facility] [place] sent request refill for methocarbomol 500mg  #40

## 2021-10-15 NOTE — Telephone Encounter (Signed)
Medication refilled and sent to preferred pharmacy

## 2021-10-15 NOTE — Telephone Encounter (Signed)
Patient called Stated that the # count was in correct on her script -- busPIRone (BUSPAR) 10 MG tablet Take 1 tablet (10 mg total) by mouth 2 (two)  times daily., Starting Mon 10/12/2021, Normal  Dispense:30 tablets

## 2021-10-27 ENCOUNTER — Telehealth (INDEPENDENT_AMBULATORY_CARE_PROVIDER_SITE_OTHER): Payer: Medicare Other | Admitting: Student in an Organized Health Care Education/Training Program

## 2021-10-27 DIAGNOSIS — F411 Generalized anxiety disorder: Secondary | ICD-10-CM | POA: Diagnosis not present

## 2021-10-27 DIAGNOSIS — F3160 Bipolar disorder, current episode mixed, unspecified: Secondary | ICD-10-CM

## 2021-10-27 DIAGNOSIS — F909 Attention-deficit hyperactivity disorder, unspecified type: Secondary | ICD-10-CM | POA: Diagnosis not present

## 2021-10-27 MED ORDER — AMPHETAMINE-DEXTROAMPHETAMINE 10 MG PO TABS: 10.0000 mg | ORAL_TABLET | Freq: Two times a day (BID) | ORAL | 0 refills | Status: DC

## 2021-10-27 MED ORDER — QUETIAPINE FUMARATE 100 MG PO TABS: 200.0000 mg | ORAL_TABLET | Freq: Two times a day (BID) | ORAL | 1 refills | Status: DC

## 2021-10-27 MED ORDER — PAROXETINE HCL 20 MG PO TABS: 20.0000 mg | ORAL_TABLET | Freq: Every day | ORAL | 1 refills | Status: DC

## 2021-10-27 MED ORDER — BUSPIRONE HCL 10 MG PO TABS: 10.0000 mg | ORAL_TABLET | Freq: Two times a day (BID) | ORAL | 3 refills | Status: DC

## 2021-10-27 NOTE — Progress Notes (Signed)
Virtual Visit via Video Note  I connected with Jodi Gilmore on 10/27/21 at  2:30 PM EDT by a video enabled telemedicine application and verified that I am speaking with the correct person using two identifiers.  Location: Patient: Home Provider: [facility]   I discussed the limitations of evaluation and management by telemedicine and the availability of in person appointments. The patient expressed understanding and agreed to proceed.  History of Present Illness:  Jodi Gilmore is a 66 yr old female who presents via virtual video visit for follow up and medication management.  PPHx is significant for Bipolar Disorder,  GAD, and ADHD.  She reports that she has not been doing great for the last week or 2.  She reports that she has started to become more restless and anxious.  She reports she is more irritable and has been picking at her nails.  She reports that in the past when she had manic episodes she would pick her nails.  She reports she thinks she is currently in or ramping up to 1 of those states.  She reports her sleep quality has been poor the last week or so.  Discussed increasing her Seroquel to counter the symptoms.  She was agreeable to this.  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  Discussed her using caution with the medication.  Discussed having close follow-up in the next 3 or 4 weeks unless her symptoms start to worsen and she reported understanding.  She reports no SI, HI, or AVH.  She will return for follow-up in 3 to 4 weeks.  Observations/Objective:  Psychiatric Specialty Exam:   Review of Systems  Respiratory:  Negative for shortness of breath.   Cardiovascular:  Negative for chest pain.  Gastrointestinal:  Negative for abdominal pain, constipation, diarrhea, nausea and vomiting.  Neurological:  Negative for dizziness,  weakness and headaches.  Psychiatric/Behavioral:  Positive for dysphoric mood and sleep disturbance. Negative for hallucinations and suicidal ideas. The patient is nervous/anxious and is hyperactive.     There were no vitals taken for this visit.There is no height or weight on file to calculate BMI.  General Appearance: Casual and Fairly Groomed  Eye Contact:  Good  Speech:  Clear and Coherent and Normal Rate  Volume:  Normal  Mood:  Anxious  Affect:  Congruent  Thought Process:  Coherent and Goal Directed  Orientation:  Full (Time, Place, and Person)  Thought Content:  WDL and Logical  Suicidal Thoughts:  No  Homicidal Thoughts:  No  Memory:  Immediate;   Good Recent;   Good  Judgement:  Good  Insight:  Good  Psychomotor Activity:  Restlessness and fidgeting with her hair during the entire interview  Concentration:  Concentration: Fair and Attention Span: Fair  Recall:  Good  Fund of Knowledge:  Good  Language:  Good  Akathisia:  Negative  Handed:  Right  AIMS (if indicated):   Not Done  Assets:  Communication Skills Desire for Improvement Housing Resilience  ADL's:  Intact  Cognition:  WNL  Sleep:   fair     Assessment and Plan:  Jodi Gilmore is a 66 yr old female who presents via virtual video visit for follow up and medication management.  PPHx is significant for Bipolar Disorder,  GAD, and ADHD.   Jodi Dana is having  symptoms of mixed mania and by her own recounting is displaying traits she does when she is in a manic state.  During the interview while not pressured in speech she would often interject mid sentence and was constantly twirling her hair in her fingers.  Due to this we will increase her Seroquel to provide mood stability.  She will take 200 mg for 5 nights then increase to 300 mg.  We will not make any other medication changes at this time.  She will return for follow-up in approximately 3 to 4 weeks.   Bipolar Disorder, Current Episode  Mixed: -Increase Seroquel to 200 mg QHS for 5 nights then increase to 300 mg QHS.  90 (100 mg) tablets with 1 refill.   GAD: -Continue Paxil 20 mg daily.  30 tablets with 1 -Continue Buspar 10 mg BID.  60 tablets with 3 refills.   ADHD: -Continue Adderall 10 mg BID.  60 tablets with 0 refills.   Follow Up Instructions:    I discussed the assessment and treatment plan with the patient. The patient was provided an opportunity to ask questions and all were answered. The patient agreed with the plan and demonstrated an understanding of the instructions.   The patient was advised to call back or seek an in-person evaluation if the symptoms worsen or if the condition fails to improve as anticipated.  I provided 19 minutes of non-face-to-face time during this encounter.   Briant Cedar, MD

## 2021-10-28 ENCOUNTER — Telehealth (HOSPITAL_COMMUNITY): Payer: Medicare Other | Admitting: Physician Assistant

## 2021-10-30 ENCOUNTER — Ambulatory Visit (INDEPENDENT_AMBULATORY_CARE_PROVIDER_SITE_OTHER): Payer: Medicare Other | Admitting: Clinical

## 2021-10-30 DIAGNOSIS — F3160 Bipolar disorder, current episode mixed, unspecified: Secondary | ICD-10-CM

## 2021-11-01 NOTE — Progress Notes (Signed)
THERAPIST PROGRESS NOTE Virtual Visit via Video Note  I connected with Jodi Gilmore on 10/30/2021 at  8:00 AM EDT by a video enabled telemedicine application and verified that I am speaking with the correct person using two identifiers.  Location: Patient: home Provider: office   I discussed the limitations of evaluation and management by telemedicine and the availability of in person appointments. The patient expressed understanding and agreed to proceed.   Follow Up Instructions: I discussed the assessment and treatment plan with the patient. The patient was provided an opportunity to ask questions and all were answered. The patient agreed with the plan and demonstrated an understanding of the instructions.   The patient was advised to call back or seek an in-person evaluation if the symptoms worsen or if the condition fails to improve as anticipated.   Session Time: 30 minutes  Participation Level: Active  Behavioral Response: CasualAlertEuthymic  Type of Therapy: Individual Therapy  Treatment Goals addressed: client will practice problem solving skills 3 times per week for the next 4 weeks  ProgressTowards Goals: Progressing  Interventions: CBT  Summary:  Jodi Gilmore is a 66 y.o. female who presents for the scheduled appointment oriented times five, appropriately dressed, and friendly. Client denied hallucinations and delusions. Client reported on today she is having a migraine and needing to lay down. Client reported since her last appointment her son and his wife came over to see her and discuss matters regarding her. Client reported the point has been brought up in the past by her sister as well that she supposedly does not communicate well about things concerning her needs and/or her health. Client reported she made a promise to her son to do better with that. Client reported she has had some concern that she is experiencing hypomania symptoms. Client reported  recently experiencing heightened anxiousness that did not cause her physical illness. Client reported she constantly struggles with being restless. Client reported she still needs to make follow up appointment with her medical physicians. Client reported she continues to have double vision and difficulty walking. Client reported while she was taking out trash she met a women who told her about a program which seniors help each other. Client reported she will contact the organization to see what all they have to offer.  Evidence of progress towards goal:  client reported she still keeps a list of things to do such as checking the mail ,showers and organizing at least 5 days out of the week. Client reported her organization of her home has helped to relieve stress.  Suicidal/Homicidal: Nowithout intent/plan  Therapist Response:  Therapist began the appointment asking the client how she has been doing since last seen. Therapist used CBT to engage in active listening and positive emotional support. Therapist used CBT to give the client time to discuss the stressor in her family dynamic and how to improve the relationship. Therapist used CBT to collaboratively identify how her health concerns affect her daily functioning. Therapist used CBT ask the client to identify her progress with frequency of use with coping skills with continued practice in her daily activity.    Therapist assigned the client homework self care.    Plan: Return again in 4 weeks.  Diagnosis: bipolar affective disorder,current episode mixed, current episode severity unspecified  Collaboration of Care: Patient refused AEB none requested by the client at this time.  Patient/Guardian was advised Release of Information must be obtained prior to any record release in order to collaborate their  care with an outside provider. Patient/Guardian was advised if they have not already done so to contact the registration department to sign all  necessary forms in order for [place] to release information regarding their care.   Consent: Patient/Guardian gives verbal consent for treatment and assignment of benefits for services provided during this visit. Patient/Guardian expressed understanding and agreed to proceed.   [place], LCSW 10/30/2021

## 2021-11-01 NOTE — Plan of Care (Signed)
  Problem: Depression CCP Problem  1  Goal: LTG: Jodi Celeste "[name]" WILL SCORE LESS THAN 10 ON THE PATIENT HEALTH QUESTIONNAIRE (PHQ-9) Outcome: Progressing Goal: STG: Jodi Celeste "[name]" WILL COMPLETE AT LEAST 80% OF ASSIGNED HOMEWORK Outcome: Progressing Goal: STG: Jodi "[name]" WILL IDENTIFY 3 COGNITIVE PATTERNS AND BELIEFS THAT SUPPORT DEPRESSION Outcome: Progressing   Problem: Anxiety Disorder CCP Problem  1  Goal: LTG: Patient will score less than 5 on the Generalized Anxiety Disorder 7 Scale (GAD-7) Outcome: Progressing Goal: STG: Patient will practice problem solving skills 3 times per week for the next 4 weeks Outcome: Progressing Goal: STG: Patient will reduce frequency of avoidant behaviors by 50% as evidenced by self-report in therapy sessions Outcome: Progressing

## 2021-11-04 ENCOUNTER — Other Ambulatory Visit: Payer: Self-pay | Admitting: Family Medicine

## 2021-11-11 ENCOUNTER — Encounter: Payer: Medicare Other | Admitting: Student

## 2021-11-11 NOTE — Progress Notes (Deleted)
    SUBJECTIVE:   Chief compliant/HPI: annual examination  Jodi Gilmore is a 66 y.o. who presents today for an annual exam.    History tabs reviewed and updated ***.   Review of systems form reviewed and notable for ***.   OBJECTIVE:   There were no vitals taken for this visit.  ***  ASSESSMENT/PLAN:   No problem-specific Assessment & Plan notes found for this encounter.    Annual Examination  See AVS for age appropriate recommendations  PHQ score ***, reviewed and discussed.  BP reviewed and at goal ***.  Asked about intimate partner violence and resources given as appropriate  Advance directives discussion ***  Considered the following items based upon USPSTF recommendations: Diabetes screening: {discussed/ordered:14545} Screening for elevated cholesterol: {discussed/ordered:14545} HIV testing: {discussed/ordered:14545} Hepatitis C: {discussed/ordered:14545} Hepatitis B: {discussed/ordered:14545} Syphilis if at high risk: {discussed/ordered:14545} GC/CT {GC/CT screening :23818} DEXA {ordered not order:23822}.  Reviewed risk factors for latent tuberculosis and {not indicated/requested/declined:14582}   Discussed family history, BRCA testing {not indicated/requested/declined:14582}. Tool used to risk stratify was ***.  Cervical cancer screening: {PAPTYPE:23819} Breast cancer screening: {mammoscreen:23820} Colorectal cancer screening: {crcscreen:23821::"discussed, colonoscopy ordered"} Lung cancer screening: {discussed/declined/written info:19698}. See documentation below regarding indications/risks/benefits.  Vaccinations ***.   Follow up in 1 *** year or sooner if indicated.    Jodi Dubonnet, MD [facility] .

## 2021-11-17 ENCOUNTER — Other Ambulatory Visit: Payer: Self-pay | Admitting: Student

## 2021-11-26 ENCOUNTER — Ambulatory Visit (INDEPENDENT_AMBULATORY_CARE_PROVIDER_SITE_OTHER): Payer: Medicare Other | Admitting: Student

## 2021-11-26 ENCOUNTER — Encounter: Payer: Self-pay | Admitting: Student

## 2021-11-26 ENCOUNTER — Other Ambulatory Visit: Payer: Self-pay

## 2021-11-26 VITALS — BP 123/79 | HR 108 | Wt 194.3 lb

## 2021-11-26 DIAGNOSIS — E039 Hypothyroidism, unspecified: Secondary | ICD-10-CM | POA: Diagnosis present

## 2021-11-26 DIAGNOSIS — Z0000 Encounter for general adult medical examination without abnormal findings: Secondary | ICD-10-CM

## 2021-11-26 DIAGNOSIS — H532 Diplopia: Secondary | ICD-10-CM

## 2021-11-26 DIAGNOSIS — Z1231 Encounter for screening mammogram for malignant neoplasm of breast: Secondary | ICD-10-CM

## 2021-11-26 DIAGNOSIS — Z23 Encounter for immunization: Secondary | ICD-10-CM

## 2021-11-26 DIAGNOSIS — R69 Illness, unspecified: Secondary | ICD-10-CM | POA: Insufficient documentation

## 2021-11-26 DIAGNOSIS — R7309 Other abnormal glucose: Secondary | ICD-10-CM | POA: Diagnosis not present

## 2021-11-26 DIAGNOSIS — R7982 Elevated C-reactive protein (CRP): Secondary | ICD-10-CM | POA: Diagnosis not present

## 2021-11-26 LAB — POCT GLYCOSYLATED HEMOGLOBIN (HGB A1C): Hemoglobin A1C: 5.4 % (ref 4.0–5.6)

## 2021-11-26 NOTE — Patient Instructions (Addendum)
Ms. Falkenstein,  It is such a joy to meet you!  Here's what we talked about: I am referring you to neuroophthalmology We are requesting your records from [facility] I am checking some labs. I will send you a mychart message if they are normal and will call you with results if not. Do not take more than 1000mg  of Tylenol at a time Please call the rheumatologist office to make a follow up appointment. Their number is [phone] We are giving you your flu shot and pneumonia vaccine today   [phone], MD

## 2021-11-26 NOTE — Assessment & Plan Note (Signed)
-   PCV 20 and flu vaccine today - Mammogram ordered, [facility] to call [facility] to schedule

## 2021-11-26 NOTE — Assessment & Plan Note (Addendum)
Symptoms and tachycardia raise concern for hyperthyroidism, possibly 2/2 overdosing Synthroid. - Thyroid panel  - Will adjust Synthroid dosing accordingly

## 2021-11-26 NOTE — Assessment & Plan Note (Signed)
Certainly warrants subspecialist input. I am unsure of what to make of the referral to Dr. Allena Katz given that she is a pediatric provider in the community. I am eager to see [facility]'s most recent notes. Unfortunately it does not appear they have been sent to our office. - Patient signed off on records request to [facility] - Once I have a name to her diagnosis, I will be happy to have our referral coordinator work on a referral to neuro-ophthalmology

## 2021-11-26 NOTE — Assessment & Plan Note (Addendum)
Per rheum note, may be a candidate for a DMARD which may benefit her multiple inflammatory issues even in the absence of a clear diagnosis. She should still be in good standing with their office as it's been just over a year since her last visit. - Will re-check a CRP today per patient preference - Patient to call rheum office and make follow-up appointment

## 2021-11-26 NOTE — Progress Notes (Addendum)
SUBJECTIVE:   CHIEF COMPLAINT / HPI:  Multiple issues today  Diplopia Has been seen by [facility]. 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 Apparently [facility] has referred her to Dr. Rodman Pickle who is a pediatric ophthalmologist who may have some specialization in this particular condition? She is concerned that Medicare may not pay for her to see a pediatric provider. Is requesting a referral to neuro-ophthalmology. Is willing to travel regionally to specialists, though is unable to drive herself.   Thyroid Dysfunction History of Grave's dz s/p radioactive thyroid ablation. At her last visit, Synthroid was decreased to based on a TSH of 0.341. She is concerned she may be hyperthyroid right now as she feels she is manic (has a known hx of bipolar) and feels like she is "running high." Also reports that she often paradoxically gains weight when she is hyperthyroid and states that she has gained about 26lbs. She requests a full thyroid panel rather than just TSH based on experiences she has had in the past. Has followed with endocrine previously but was unfortunately dismissed by the local endocrine practice after missing two appointments for illness. As part of her feeling that she is "running high" she is also concerned for elevated levels of inflammation. Would like to check a CRP as has been elevated in the past. Had a workup with rheumatology due to multi-system inflammatory symptoms. Workup did not reveal a clear diagnosis, but per their last note, they were considering her as a DMARD candidate. She did not follow-up with them due to worsening anxiety symptoms over the past year which prevented her from leaving the house.   Tylenol Overuse Takes 1300mg  of Tylenol at least twice per day and often will take it with an Excedrine migraine relief in  the morning. Knows that this is a supratherapeutic dose of Tylenol. Is worried about effects on liver. Would like to check LFTs.   Healthcare Maintenance Overdue for a mammogram. She thinks it has been over twenty years since her last mammogram. Willing to get PCV 20 and flu vaccines today.    OBJECTIVE:   BP 123/79   Pulse (!) 108   Wt 194 lb 4.8 oz (88.1 kg)   SpO2 95%   BMI 32.33 kg/m   Gen: Anxious, perhaps a bit manic but pleasant and engaged in interview and exam HENT: Diploplia, difficulty focusing vision, MMM, unable to palpate thyroid tissue Cardio: Tachycardic, regular, without murmur, rub, or gallop Pulm: Normal WOB on RA, lungs clear to auscultation throughout Neuro: psychomotor agitation and restlessness manifesting in continual picking at nails during interview Psych: Speech is somewhat pressured, not depressed  ASSESSMENT/PLAN:   Diplopia Certainly warrants subspecialist input. I am unsure of what to make of the referral to Dr. given that she is a pediatric provider in the community. I am eager to see [facility]'s most recent notes. Unfortunately it does not appear they have been sent to our office. - Patient signed off on records request to [facility] - Once I have a name to her diagnosis, I will be happy to have our referral coordinator work on a referral to neuro-ophthalmology  Hypothyroidism (acquired) Symptoms and tachycardia raise concern for hyperthyroidism, possibly 2/2 overdosing Synthroid. - Thyroid panel  - Will adjust Synthroid dosing accordingly  Overuse of acetaminophen - Advised to not take more than 1000mg  of  Tylenol at a time - Stop taking Tylenol and Excedrin together  - Will monitor LFTs on CMP  Healthcare maintenance - PCV 20 and flu vaccine today - Mammogram ordered, patient to call [facility] to schedule  Elevated C-reactive protein (CRP) Per rheum note, may be a candidate for a DMARD which may benefit her  multiple inflammatory issues even in the absence of a clear diagnosis. She should still be in good standing with their office as it's been just over a year since her last visit. - Will re-check a CRP today per patient preference - Patient to call rheum office and make follow-up appointment     Dorothyann Gibbs, MD [facility]

## 2021-11-26 NOTE — Assessment & Plan Note (Signed)
-   Advised to not take more than 1000mg  of Tylenol at a time - Stop taking Tylenol and Excedrin together  - Will monitor LFTs on CMP

## 2021-11-27 ENCOUNTER — Encounter: Payer: Self-pay | Admitting: Student

## 2021-11-27 ENCOUNTER — Telehealth (INDEPENDENT_AMBULATORY_CARE_PROVIDER_SITE_OTHER): Payer: Medicare Other | Admitting: Student in an Organized Health Care Education/Training Program

## 2021-11-27 DIAGNOSIS — F411 Generalized anxiety disorder: Secondary | ICD-10-CM

## 2021-11-27 DIAGNOSIS — F3160 Bipolar disorder, current episode mixed, unspecified: Secondary | ICD-10-CM

## 2021-11-27 DIAGNOSIS — F909 Attention-deficit hyperactivity disorder, unspecified type: Secondary | ICD-10-CM | POA: Diagnosis not present

## 2021-11-27 LAB — COMPREHENSIVE METABOLIC PANEL
ALT: 24 IU/L (ref 0–32)
AST: 21 IU/L (ref 0–40)
Albumin/Globulin Ratio: 1.9 (ref 1.2–2.2)
Albumin: 4.6 g/dL (ref 3.9–4.9)
Alkaline Phosphatase: 150 IU/L — ABNORMAL HIGH (ref 44–121)
BUN/Creatinine Ratio: 23 (ref 12–28)
BUN: 23 mg/dL (ref 8–27)
Bilirubin Total: <0.2 mg/dL (ref 0.0–1.2)
CO2: 18 mmol/L — ABNORMAL LOW (ref 20–29)
Calcium: 9.2 mg/dL (ref 8.7–10.3)
Chloride: 104 mmol/L (ref 96–106)
Creatinine, Ser: 0.99 mg/dL (ref 0.57–1.00)
Globulin, Total: 2.4 g/dL (ref 1.5–4.5)
Glucose: 115 mg/dL — ABNORMAL HIGH (ref 70–99)
Potassium: 3.8 mmol/L (ref 3.5–5.2)
Sodium: 141 mmol/L (ref 134–144)
Total Protein: 7 g/dL (ref 6.0–8.5)
eGFR: 63 mL/min/{1.73_m2} (ref >59)

## 2021-11-27 LAB — C-REACTIVE PROTEIN: CRP: 11 mg/L — ABNORMAL HIGH (ref 0–10)

## 2021-11-27 LAB — THYROID PANEL WITH TSH
Free Thyroxine Index: 1.2 (ref 1.2–4.9)
T3 Uptake Ratio: 22 % — ABNORMAL LOW (ref 24–39)
T4, Total: 5.5 ug/dL (ref 4.5–12.0)

## 2021-11-27 LAB — T4F: T4,Free (Direct): 0.76 ng/dL — ABNORMAL LOW (ref 0.82–1.77)

## 2021-11-27 LAB — TSH RFX ON ABNORMAL TO FREE T4: TSH: 7.5 u[IU]/mL — ABNORMAL HIGH (ref 0.450–4.500)

## 2021-11-27 MED ORDER — QUETIAPINE FUMARATE 400 MG PO TABS: 400.0000 mg | ORAL_TABLET | Freq: Every day | ORAL | 1 refills | Status: DC

## 2021-11-27 MED ORDER — PAROXETINE HCL 20 MG PO TABS: 20.0000 mg | ORAL_TABLET | Freq: Every day | ORAL | 1 refills | Status: DC

## 2021-11-27 MED ORDER — BUSPIRONE HCL 10 MG PO TABS: 10.0000 mg | ORAL_TABLET | Freq: Three times a day (TID) | ORAL | 1 refills | Status: DC

## 2021-11-27 MED ORDER — LEVOTHYROXINE SODIUM 112 MCG PO TABS: 112.0000 ug | ORAL_TABLET | ORAL | 3 refills | Status: DC

## 2021-11-27 NOTE — Addendum Note (Signed)
Addended by: Darnelle Spangle B on: 11/27/2021 02:42 PM   Modules accepted: Orders

## 2021-11-27 NOTE — Progress Notes (Signed)
Virtual Visit via Video Note  I connected with Jodi Gilmore on 11/27/21 at  1:30 PM EST by a video enabled telemedicine application and verified that I am speaking with the correct person using two identifiers.  Location: Patient: Home Provider: [facility]   I discussed the limitations of evaluation and management by telemedicine and the availability of in person appointments. The patient expressed understanding and agreed to proceed.  History of Present Illness:  Jodi Gilmore is a 66 yr old female who presents via virtual video visit for follow up and medication management.  PPHx is significant for Bipolar Disorder,  GAD, and ADHD.   She reports she is doing okay today but is having many medical issues that are troubling.  She reports her thyroid continues to be an issue and her PCP is having to make changes to her Synthroid dose.  She reports she is having troubles with her vision and the ophthalmologist has told her it needs surgical intervention.  She reports that she is going to see a ophthalmologist who specializes in the surgeries to address this.  She reports that her vision is bad enough to the point where it is limiting her ability to even walk around her house.  She reports that because of this she will be selling her car because she cannot drive drive at.  She reports that after we had finished speaking at the last appointment other people have told her she was acting manic.  He reports she has had some improvement in symptoms however she is still having issues with sleep and specifically irritability.  She reports getting irritated over things that should not be bothering her.  She reports her anxiety has also continued to be significant.  Discussed increasing her Seroquel to better control her symptoms.  Discussed increasing the frequency of her BuSpar to better control her anxiety.  She was agreeable to these.  She reports no SI, HI, or AVH.  She reports her sleep is poor.  She  reports having some headache, nausea, and issues being off balance (most likely due to eye issues).  She reports no other concerns at present.  She will return for follow-up in approximately 4 weeks.    Observations/Objective:  Psychiatric Specialty Exam: Physical Exam Constitutional:      General: She is not in acute distress.    Appearance: Normal appearance. She is not ill-appearing or toxic-appearing.  HENT:     Head: Normocephalic and atraumatic.  Pulmonary:     Effort: Pulmonary effort is normal.  Neurological:     General: No focal deficit present.     Mental Status: She is alert.     Review of Systems  Gastrointestinal:  Positive for nausea (mild). Negative for vomiting.  Neurological:  Positive for dizziness (occasional) and headaches. Negative for weakness.  Psychiatric/Behavioral:  Positive for decreased concentration and sleep disturbance. Negative for dysphoric mood, hallucinations and suicidal ideas. The patient is nervous/anxious.     There were no vitals taken for this visit.There is no height or weight on file to calculate BMI.  General Appearance: Casual and Fairly Groomed  Eye Contact:  Good  Speech:  Clear and Coherent and Normal Rate  Volume:  Normal  Mood:   "ok but more irritable"  Affect:  Appropriate and Congruent  Thought Process:  Coherent and Goal Directed  Orientation:  Full (Time, Place, and Person)  Thought Content:  WDL and Logical  Suicidal Thoughts:  No  Homicidal Thoughts:  No  Memory:  Immediate;   Good Recent;   Good  Judgement:  Fair  Insight:  Fair  Psychomotor Activity:  Normal  Concentration:  Concentration: Fair and Attention Span: Fair  Recall:  Good  Fund of Knowledge:  Good  Language:  Good  Akathisia:  Negative  Handed:  Right  AIMS (if indicated):     Assets:  Communication Skills Desire for Improvement Housing Resilience  ADL's:  Impaired due to physical issues not psychiatric  Cognition:  WNL  Sleep:   poor      Assessment and Plan:  Jodi Gilmore is a 66 yr old female who presents via virtual video visit for follow up and medication management.  PPHx is significant for Bipolar Disorder,  GAD, and ADHD.    "[name]" is somewhat improved with the increase in Seroquel but notes she is still having issues with racing thoughts and poor sleep.  We will increase her Seroquel further to address this.  Given her continued anxiety we will increase the frequency of her BuSpar from twice a day to 3 times a day.  Most likely her medical issues (thyroid/eye) are contributing to her psychiatric issues but these issues are being addressed.  She will return for follow-up in approximately 4 weeks.   Bipolar Disorder, Current Episode Mixed: -Increase Seroquel to 400 mg QHS.  30 tablets with 1 refill.     GAD: -Continue Paxil 20 mg daily.  30 tablets with 1 -Increase frequency Buspar to 10 mg TID.  90 tablets with 1 refills.     ADHD: -Continue Adderall 10 mg BID.  No refills sent at this time.    Follow Up Instructions:    I discussed the assessment and treatment plan with the patient. The patient was provided an opportunity to ask questions and all were answered. The patient agreed with the plan and demonstrated an understanding of the instructions.   The patient was advised to call back or seek an in-person evaluation if the symptoms worsen or if the condition fails to improve as anticipated.  I provided 26 minutes of non-face-to-face time during this encounter.   Lauro Franklin, MD

## 2021-12-07 ENCOUNTER — Other Ambulatory Visit: Payer: Self-pay

## 2021-12-07 MED ORDER — METHOCARBAMOL 500 MG PO TABS: ORAL_TABLET | ORAL | 1 refills | Status: DC

## 2021-12-11 ENCOUNTER — Telehealth (HOSPITAL_COMMUNITY): Payer: Self-pay | Admitting: *Deleted

## 2021-12-11 DIAGNOSIS — F411 Generalized anxiety disorder: Secondary | ICD-10-CM

## 2021-12-11 MED ORDER — BUSPIRONE HCL 10 MG PO TABS: 10.0000 mg | ORAL_TABLET | Freq: Three times a day (TID) | ORAL | 1 refills | Status: DC

## 2021-12-11 NOTE — Telephone Encounter (Signed)
PATIENT CALLED STATED Dr ALEX INCREASED HER -- busPIRone (BUSPAR) 10 MG tablet  Take 1 tablet (10 mg total) by mouth 3 (three) times daily., Starting Fri 11/27/2021  AND SHE WILL BE OUT MEDICATION --REQUESTED REFILL 

## 2021-12-11 NOTE — Addendum Note (Signed)
Addended by: Lauro Franklin on: 12/11/2021 02:02 PM   Modules accepted: Orders

## 2021-12-11 NOTE — Telephone Encounter (Signed)
PATIENT CALLED STATED Dr Trinna Post INCREASED HER -- busPIRone (BUSPAR) 10 MG tablet  Take 1 tablet (10 mg total) by mouth 3 (three) times daily., Starting Fri 11/27/2021  AND SHE WILL BE OUT MEDICATION --REQUESTED REFILL

## 2021-12-11 NOTE — Telephone Encounter (Signed)
Received message that patient needed refill of her BuSpar so this was sent.   Sent: -Buspar 10 mg TID.  90 tablets with 1 refill.     Arna Snipe MD Resident

## 2021-12-23 ENCOUNTER — Ambulatory Visit: Payer: Medicare Other | Admitting: Internal Medicine

## 2021-12-25 ENCOUNTER — Telehealth (INDEPENDENT_AMBULATORY_CARE_PROVIDER_SITE_OTHER): Payer: Medicare Other | Admitting: Student in an Organized Health Care Education/Training Program

## 2021-12-25 ENCOUNTER — Other Ambulatory Visit: Payer: Self-pay | Admitting: Student

## 2021-12-25 DIAGNOSIS — F3160 Bipolar disorder, current episode mixed, unspecified: Secondary | ICD-10-CM

## 2021-12-25 DIAGNOSIS — F411 Generalized anxiety disorder: Secondary | ICD-10-CM

## 2021-12-25 MED ORDER — QUETIAPINE FUMARATE 400 MG PO TABS: 400.0000 mg | ORAL_TABLET | Freq: Every day | ORAL | 1 refills | Status: DC

## 2021-12-25 MED ORDER — BUSPIRONE HCL 10 MG PO TABS: 10.0000 mg | ORAL_TABLET | Freq: Three times a day (TID) | ORAL | 1 refills | Status: DC

## 2021-12-25 MED ORDER — PAROXETINE HCL 20 MG PO TABS: 20.0000 mg | ORAL_TABLET | Freq: Every day | ORAL | 1 refills | Status: DC

## 2021-12-25 NOTE — Progress Notes (Signed)
BH MD/PA/NP OP Progress Note   Virtual Visit via Video Note  I connected with Jodi Gilmore on 12/25/21 at  4:00 PM EST by a video enabled telemedicine application and verified that I am speaking with the correct person using two identifiers.  Location: Patient: Home Provider: [facility]   I discussed the limitations of evaluation and management by telemedicine and the availability of in person appointments. The patient expressed understanding and agreed to proceed.   12/26/2021 3:45 PM Jodi Gilmore  MRN:  [id number]  Chief Complaint:  Chief Complaint  Patient presents with   Follow-up   Manic Behavior   Anxiety   HPI:  Jodi Gilmore is a 66 yr old female who presents via virtual video visit for follow up and medication management.  PPHx is significant for Bipolar Disorder,  GAD, and ADHD.    She reports that her mood is improving but that she is under significant stress due to her issues.  She reports that she has had 2 appointments with ophthalmology and has a neurology appointment upcoming.  She reports that she has hired a caregiver and so eating will improve for her.  She reports that she does have concern for dementia as her mother and sister had that.  She reports that she is noticing some similar symptoms in herself.  Discussed with her that the neurologist appointment will help further clarify things.  She reports that her sleep continues to be fragmented as she will sleep 2 to 3 hours at a time and wake up before going back to sleep.  She reports having dry mouth since the increase in her Seroquel but otherwise reports no other issues with her medications.  She reports that she has been using things like lozenges and so it is not a significant issue.  Discussed with her that we may want to decrease the Seroquel given her balance issues she is already having due to her eye problems and not wanting to worsen her dizziness but that we would wait for follow-up prior to decreasing  and she reported understanding.  She reports no SI, HI, or AVH.  She reports her appetite is okay.  She reports chronic headache and dizziness but reports no other concerns at present.  She will return for follow up in approximately 4-6 weeks.    Visit Diagnosis:    ICD-10-CM   1. Generalized anxiety disorder  F41.1 PARoxetine (PAXIL) 20 MG tablet    busPIRone (BUSPAR) 10 MG tablet    2. Bipolar affective disorder, current episode mixed, current episode severity unspecified ([place])  F31.60 PARoxetine (PAXIL) 20 MG tablet    QUEtiapine (SEROQUEL) 400 MG tablet      Past Psychiatric History: Bipolar Disorder,  GAD, and ADHD.   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    Hip pain  HLA B27 (HLA B27 positive)    Hyperlipidemia    Hypertension    Knee pain    Memory changes    Morphea scleroderma    Neuromuscular disorder (HCC)    Raynauds    Pneumonia    Psoriatic arthritis (HCC)    seen in ears only   Raynaud's disease    Sjogren syndrome with dental involvement ([place])     Past Surgical History:  Procedure Laterality Date   CYST REMOVAL HAND     left arm posterior   laproscopy     SALPINGECTOMY  1980   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   Procedure: LEFT TOTAL HIP ARTHROPLASTY ANTERIOR APPROACH;  Surgeon: Mcarthur Rossetti, MD;  Location: [facility];  Service: Orthopedics;  Laterality: Left;   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      Family Psychiatric History: Nephew - Schizophrenia Father -  patient suspects that her father may be bipolar  Family History:  Family History  Problem Relation Age of Onset   Heart disease Mother    Congestive Heart Failure Mother    Dementia Mother    Heart disease Father    Heart attack Father    Dementia Sister    Hemachromatosis Sister    Parkinson's disease Sister    Allergies Son    Healthy Son    Colon cancer Neg Hx    Colon polyps Neg Hx    Esophageal cancer Neg Hx    Stomach cancer Neg Hx    Rectal cancer Neg Hx     Social History:  Social History   Socioeconomic History   Marital status: Single    Spouse name: Not on file   Number of children: Not on file   Years of education: Not on file   Highest education level: Not on file  Occupational History   Not on file  Tobacco Use   Smoking status: Former   Smokeless tobacco: Never  Vaping Use   Vaping Use: Never used  Substance and Sexual Activity   Alcohol use: Not Currently   Drug use: Never   Sexual activity: Not on file  Other Topics Concern   Not on file  Social History Narrative   Not on file   Social Determinants of Health   Financial Resource Strain: Not on file  Food Insecurity: Not on file  Transportation Needs: Not on file  Physical Activity: Not on file  Stress: Not on file  Social Connections: Not on file    Allergies:  Allergies  Allergen Reactions   Contrast Media [Iodinated Contrast Media] Anaphylaxis   Prohance [Gadoteridol] Anaphylaxis   Ptu [Propylthiouracil]    Sulfa Antibiotics     Unknown reaction, family history   Tapazole [Methimazole]    Latex Hives and Rash    Metabolic Disorder Labs: Lab Results  Component Value Date   HGBA1C 5.4 11/26/2021   No results found for: "PROLACTIN" Lab Results  Component Value Date   CHOL 272 (H) 02/12/2020   TRIG 145 02/12/2020   HDL 71 02/12/2020   CHOLHDL 3.8 02/12/2020   LDLCALC 175 (H) 02/12/2020   Lab Results  Component Value Date   TSH 7.500 (H) 11/26/2021   TSH CANCELED  11/26/2021    Therapeutic Level Labs: No results found for: "LITHIUM" No results found for: "VALPROATE" No results found for: "CBMZ"  Current Medications: Current Outpatient Medications  Medication Sig Dispense Refill   acetaminophen (TYLENOL) 325 MG tablet Take 650 mg by mouth every 6 (  six) hours as needed for moderate pain.     amphetamine-dextroamphetamine (ADDERALL) 10 MG tablet Take 1 tablet (10 mg total) by mouth 2 (two) times daily with a meal. 60 tablet 0   aspirin-acetaminophen-caffeine (EXCEDRIN MIGRAINE) 250-250-65 MG tablet Take 2 tablets by mouth every 6 (six) hours as needed for headache.     atorvastatin (LIPITOR) 40 MG tablet TAKE ONE TABLET BY MOUTH ONE TIME DAILY 90 tablet 3   B Complex Vitamins (VITAMIN B COMPLEX PO) Take 1 tablet by mouth daily. (Patient not taking: Reported on 11/26/2021)     Bacillus Coagulans-Inulin (PROBIOTIC-PREBIOTIC PO) Take 1 capsule by mouth daily. (Patient not taking: Reported on 11/26/2021)     busPIRone (BUSPAR) 10 MG tablet Take 1 tablet (10 mg total) by mouth 3 (three) times daily. 90 tablet 1   cetirizine (ZYRTEC ALLERGY) 10 MG tablet Take 1 tablet (10 mg total) by mouth daily. 90 tablet 1   Cholecalciferol (VITAMIN D) 125 MCG (5000 UT) CAPS Take 10,000 Units by mouth daily. (Patient not taking: Reported on 11/26/2021)     diclofenac Sodium (VOLTAREN) 1 % GEL Apply 4 g topically 4 (four) times daily. (Patient not taking: Reported on 11/26/2021) 150 g 4   FEVERFEW PO Take 1 tablet by mouth 3 (three) times daily. (Patient not taking: Reported on 11/26/2021)     fluticasone (FLONASE) 50 MCG/ACT nasal spray USE TWO SPRAYS IN EACH NOSTRIL ONE TIME DAILY 16 mL 6   levothyroxine (SYNTHROID) 112 MCG tablet Take 1 tablet (112 mcg total) by mouth every morning. 30 minutes before food 90 tablet 3   lisinopril (ZESTRIL) 2.5 MG tablet TAKE ONE TABLET BY MOUTH AT BEDTIME 90 tablet 1   Magnesium Oxide (MAG-OX 400 PO) Take 400 mg by mouth in the morning  and at bedtime. (Patient not taking: Reported on 11/26/2021)     Melatonin 1 MG SUBL  (Patient not taking: Reported on 11/26/2021)     methocarbamol (ROBAXIN) 500 MG tablet TAKE ONE TABLET BY MOUTH EVERY 6 HOURS AS NEEDED FOR MUSCLE SPASM 40 tablet 1   Multiple Vitamin (MULTIVITAMIN) tablet Take 1 tablet by mouth daily. (Patient not taking: Reported on 11/26/2021)     Omega-3 Fatty Acids (OMEGA-3 EPA FISH OIL PO) Take 3 capsules by mouth daily. (Patient not taking: Reported on 11/26/2021)     omeprazole (PRILOSEC) 20 MG capsule TAKE ONE CAPSULE BY MOUTH EVERY MORNING AND TAKE ONE CAPSULE BY MOUTH AT BEDTIME 90 capsule 0   OVER THE COUNTER MEDICATION Take 4 capsules by mouth at bedtime. Calm Forte     PARoxetine (PAXIL) 20 MG tablet Take 1 tablet (20 mg total) by mouth daily. 30 tablet 1   Potassium Bicarbonate 99 MG CAPS Take 99 mg by mouth 3 (three) times daily. (Patient not taking: Reported on 11/26/2021)     QUEtiapine (SEROQUEL) 400 MG tablet Take 1 tablet (400 mg total) by mouth at bedtime. 30 tablet 1   RESTASIS 0.05 % ophthalmic emulsion INSTILL ONE DROP INTO EACH EYE TWICE DAILY 60 each 3   rizatriptan (MAXALT) 10 MG tablet TAKE ONE TABLET BY MOUTH AT THE ONSET OF HEADACHE. MAY REPEAT WITH ONE TABLET IN 2 HOURS IF NEEDED. DO NOT EXCEED 2 TABLETS IN 24 HOURS 10 tablet 0   TURMERIC PO Take 1 capsule by mouth in the morning, at noon, and at bedtime. (Patient not taking: Reported on 11/26/2021)     valACYclovir (VALTREX) 500 MG tablet TAKE ONE TABLET BY MOUTH TWICE A  DAY FOR 7 DAYS 30 tablet 2   No current facility-administered medications for this visit.     Musculoskeletal: Strength & Muscle Tone: within normal limits Gait & Station:  sitting during interview Patient leans: N/A  Psychiatric Specialty Exam: Review of Systems  Respiratory:  Negative for shortness of breath.   Cardiovascular:  Negative for chest pain.  Gastrointestinal:  Negative for abdominal pain, constipation,  diarrhea, nausea and vomiting.  Neurological:  Positive for dizziness and headaches. Negative for weakness.  Psychiatric/Behavioral:  Positive for dysphoric mood. Negative for hallucinations and suicidal ideas. The patient is nervous/anxious.     There were no vitals taken for this visit.There is no height or weight on file to calculate BMI.  General Appearance: Casual and Fairly Groomed  Eye Contact:  Good  Speech:  Clear and Coherent and Normal Rate  Volume:  Normal  Mood:  Anxious and Dysphoric  Affect:  Congruent  Thought Process:  Coherent and Goal Directed  Orientation:  Full (Time, Place, and Person)  Thought Content: WDL and Logical   Suicidal Thoughts:  No  Homicidal Thoughts:  No  Memory:  Immediate;   Fair Recent;   Fair  Judgement:  Fair  Insight:  Fair  Psychomotor Activity:  Normal  Concentration:  Concentration: Good and Attention Span: Good  Recall:  Good  Fund of Knowledge: Good  Language: Good  Akathisia:  Negative  Handed:  Right  AIMS (if indicated): not done  Assets:  Communication Skills Desire for Improvement Housing Resilience  ADL's:  Impaired due to physical issues not psychiatric   Cognition: WNL  Sleep:  Poor   Screenings: GAD-7    Flowsheet Row Video Visit from 08/26/2021 in [facility] Video Visit from 06/24/2021 in [facility] Video Visit from 04/22/2021 in [facility] Video Visit from 03/20/2021 in [facility] Video Visit from 02/19/2021 in [facility]  Total GAD-7 Score _0 ONG2-9    [place] Office Visit from 11/26/2021 in [place] Video Visit from 08/26/2021 in [facility] Video Visit from 06/24/2021 in [facility] Video Visit from 04/22/2021 in [facility] Video Visit from  03/20/2021 in [place]  PHQ-2 Total Score _1 PHQ-9 Total Score _2 Flowsheet Row Video Visit from 08/26/2021 in [facility] Video Visit from 06/24/2021 in [facility] from 05/05/2021 in [place] Error: Question 6 not populated        Assessment and Plan:  Jodi Gilmore is a 66 yr old female who presents via virtual video visit for follow up and medication management.  PPHx is significant for Bipolar Disorder,  GAD, and ADHD.     "[name]" does appear to have become mood stabilized as she is no longer as distracted and tangential.  She is having increased anxiety but this is due to her medical issues which are complex.  We will not make any changes to her medications at this point as she has just become mood stabilized.   We may decrease her Seroquel at follow up given her issues with stability due to her eyesight and not wanting to  further worsen that.  She will return for follow up in 4-6 weeks.   Bipolar Disorder, Current Episode Mixed: -Continue Seroquel 400 mg QHS.  30 tablets with 1 refill.     GAD: -Continue Paxil 20 mg daily.  30 tablets with 1 -Continue Buspar 10 mg TID.  90 tablets with 1 refills.     ADHD: -Continue Adderall 10 mg BID.  No refills sent at this time.   Collaboration of Care:   Patient/Guardian was advised Release of Information must be obtained prior to any record release in order to collaborate their care with an outside provider. Patient/Guardian was advised if they have not already done so to contact the registration department to sign all necessary forms in order for [place] to release information regarding their care.   Consent: Patient/Guardian gives verbal consent for treatment and assignment of benefits for services provided during this visit.  Patient/Guardian expressed understanding and agreed to proceed.    Briant Cedar, MD 12/26/2021, 3:45 PM     Follow Up Instructions:    I discussed the assessment and treatment plan with the patient. The patient was provided an opportunity to ask questions and all were answered. The patient agreed with the plan and demonstrated an understanding of the instructions.   The patient was advised to call back or seek an in-person evaluation if the symptoms worsen or if the condition fails to improve as anticipated.  I provided 25 minutes of non-face-to-face time during this encounter.   Briant Cedar, MD

## 2021-12-30 ENCOUNTER — Ambulatory Visit (HOSPITAL_COMMUNITY): Payer: Medicare Other | Admitting: Clinical

## 2022-01-05 ENCOUNTER — Other Ambulatory Visit: Payer: Self-pay | Admitting: Family Medicine

## 2022-01-06 ENCOUNTER — Other Ambulatory Visit: Payer: Self-pay | Admitting: *Deleted

## 2022-01-06 MED ORDER — VALACYCLOVIR HCL 500 MG PO TABS: ORAL_TABLET | ORAL | 2 refills | Status: DC

## 2022-01-19 ENCOUNTER — Ambulatory Visit (INDEPENDENT_AMBULATORY_CARE_PROVIDER_SITE_OTHER): Payer: Medicare Other | Admitting: Clinical

## 2022-01-19 DIAGNOSIS — F411 Generalized anxiety disorder: Secondary | ICD-10-CM

## 2022-01-19 NOTE — Progress Notes (Signed)
   THERAPIST PROGRESS NOTE Virtual Visit via Video Note  I connected with Jodi Gilmore on 01/19/2022 at  3:00 PM EST by a video enabled telemedicine application and verified that I am speaking with the correct person using two identifiers.  Location: Patient: home Provider: office   I discussed the limitations of evaluation and management by telemedicine and the availability of in person appointments. The patient expressed understanding and agreed to proceed.   Follow Up Instructions: I discussed the assessment and treatment plan with the patient. The patient was provided an opportunity to ask questions and all were answered. The patient agreed with the plan and demonstrated an understanding of the instructions.   The patient was advised to call back or seek an in-person evaluation if the symptoms worsen or if the condition fails to improve as anticipated.   Session Time: 30 minutes  Participation Level: Active  Behavioral Response: CasualAlertEuthymic  Type of Therapy: Individual Therapy  Treatment Goals addressed: client will reduce avoidant behaviors by 50% as evidenced by self report in therapy sessions  ProgressTowards Goals: Progressing  Interventions: CBT and Supportive  Summary:  Jodi Gilmore is a 67 y.o. female who presents for the scheduled appointment oriented times five, appropriately dressed, and friendly. Client denied hallucinations and delusions. Client reported she has been fairly well. Client reported she has been sick and think it may be RSV virus. Client reported she is keeping monitor of her symptoms. Client reported she has been having other doctor appointments for her eyes. Client reported the doctors said the cause of her vision may be due to mini strokes, a tumor, M.S, or another condition. Client reported she has taken to options as well as she can and just wants answers. Client reported progressively over the past 6 months seeing things at an angle, that  are not in actuality. Client reported her son has been taking off of work as he can to take her to appointments. Client reported otherwise she is doing her best to manage taking care of herself at home. Evidence of progress towards goal:  client reported following through with her scheduled medication appointments over the past 2 months.   Suicidal/Homicidal: Nowithout intent/plan  Therapist Response:  Therapist began the appointment asking the client how she has been doing since last seen. Therapist used CBT to engage using active listening and positive emotional support. Therapist used CBT to engage and ask the client how she has been feeling emotionally. Therapist used CBT to ask the client how she is coping with her medical ailments. Therapist used CBT ask the client to identify her progress with frequency of use with coping skills with continued practice in her daily activity.    Therapist assigned the client homework to practice self care.    Plan: Return again in 3 weeks.  Diagnosis: GAD  Collaboration of Care: Patient refused AEB none requested by the client.  Patient/Guardian was advised Release of Information must be obtained prior to any record release in order to collaborate their care with an outside provider. Patient/Guardian was advised if they have not already done so to contact the registration department to sign all necessary forms in order for [place] to release information regarding their care.   Consent: Patient/Guardian gives verbal consent for treatment and assignment of benefits for services provided during this visit. Patient/Guardian expressed understanding and agreed to proceed.   [place], LCSW 01/19/2022

## 2022-01-21 ENCOUNTER — Telehealth (HOSPITAL_COMMUNITY): Payer: Self-pay | Admitting: *Deleted

## 2022-01-21 NOTE — Telephone Encounter (Signed)
[facility] faxed a request for patients adderall 10 mg BID. She should be out of it. Last seen in Dec. And she has no future appts scheduled. I will forward this request to Dr Kai Levins to consider.

## 2022-01-22 ENCOUNTER — Telehealth (HOSPITAL_COMMUNITY): Payer: Self-pay | Admitting: *Deleted

## 2022-01-22 DIAGNOSIS — F909 Attention-deficit hyperactivity disorder, unspecified type: Secondary | ICD-10-CM

## 2022-01-22 MED ORDER — AMPHETAMINE-DEXTROAMPHETAMINE 10 MG PO TABS: 10.0000 mg | ORAL_TABLET | Freq: Two times a day (BID) | ORAL | 0 refills | Status: DC

## 2022-01-22 NOTE — Telephone Encounter (Signed)
Received message that patient needed a refill of her Adderall.  PDMP shows last fill was 12/20 so will send in a refill to be filled on 1/15.  Sent:  -Adderall 10 mg BID.  60 tablets with 0 refills.    Fatima Sanger MD Resident

## 2022-01-22 NOTE — Telephone Encounter (Signed)
PATIENT LVM STATED THAT Rx TOLD HER TO CALL FOR HER  REFILL --  amphetamine-dextroamphetamine (ADDERALL) 10 MG tablet

## 2022-01-22 NOTE — Addendum Note (Signed)
Addended by: Briant Cedar on: 01/22/2022 11:57 AM   Modules accepted: Orders

## 2022-01-29 ENCOUNTER — Ambulatory Visit: Payer: Medicare Other

## 2022-02-03 ENCOUNTER — Ambulatory Visit: Payer: Medicare Other | Admitting: Student

## 2022-02-10 ENCOUNTER — Ambulatory Visit: Payer: Medicare Other | Admitting: Internal Medicine

## 2022-02-14 ENCOUNTER — Other Ambulatory Visit: Payer: Self-pay | Admitting: Student

## 2022-02-15 ENCOUNTER — Telehealth (HOSPITAL_COMMUNITY): Payer: Self-pay

## 2022-02-15 DIAGNOSIS — F3160 Bipolar disorder, current episode mixed, unspecified: Secondary | ICD-10-CM

## 2022-02-15 DIAGNOSIS — F411 Generalized anxiety disorder: Secondary | ICD-10-CM

## 2022-02-15 MED ORDER — BUSPIRONE HCL 10 MG PO TABS: 10.0000 mg | ORAL_TABLET | Freq: Three times a day (TID) | ORAL | 1 refills | Status: DC

## 2022-02-15 MED ORDER — QUETIAPINE FUMARATE 400 MG PO TABS: 400.0000 mg | ORAL_TABLET | Freq: Every day | ORAL | 1 refills | Status: DC

## 2022-02-15 NOTE — Telephone Encounter (Signed)
Received message that patient needed refill of her BuSpar and Seroquel.  These were sent.   Sent: -Continue Buspar 10 mg TID.  90 tablets with 1 refills.  -Continue Seroquel 400 mg QHS.  30 tablets with 1 refill.     Fatima Sanger MD Resident

## 2022-02-15 NOTE — Telephone Encounter (Signed)
Refill for Seroquel already sent.   Fatima Sanger MD Resident

## 2022-02-16 ENCOUNTER — Encounter: Payer: Self-pay | Admitting: Student

## 2022-02-16 ENCOUNTER — Other Ambulatory Visit: Payer: Self-pay | Admitting: Neurology

## 2022-02-16 DIAGNOSIS — R110 Nausea: Secondary | ICD-10-CM

## 2022-02-16 DIAGNOSIS — H532 Diplopia: Secondary | ICD-10-CM

## 2022-02-17 ENCOUNTER — Ambulatory Visit: Payer: Medicare Other | Admitting: Internal Medicine

## 2022-02-17 MED ORDER — ONDANSETRON HCL 4 MG PO TABS: 4.0000 mg | ORAL_TABLET | Freq: Three times a day (TID) | ORAL | 0 refills | Status: DC | PRN

## 2022-02-17 NOTE — Progress Notes (Deleted)
Office Visit Note  Patient: Jodi Gilmore             Date of Birth: 26-Aug-1955           MRN: [id number]             PCP: Eppie Gibson, MD Referring: Eppie Gibson, MD Visit Date: 02/17/2022   Subjective:  No chief complaint on file.   History of Present Illness: Jodi Gilmore is a 67 y.o. female here for follow up ***   Previous HPI 09/01/20 Jodi Gilmore is a 67 y.o. female here for evaluation with history of multiple rheumatic conditions apparently RA, sjogren's syndrome, scleroderma, and raynaud's and with history of hypothyroidism. She has a somewhat complicated history with numerous symptoms both focal symptoms including joint pain and swelling, eye and mouth dryness, skin lesions reportedly both psoriasis and behcets pathology previously seen. At the moment skin disease is not very active. Joint pain is worst in the left hip with severe degenerative arthritis already seen at orthopedic surgery clinic. She also has osteoarthritis of other sites with worsening changes of fingers on bilateral hands. However she does have multiple generalized symptoms and has had labile thyroid disease with difficult to control  levothyroxine dose changes. Chronic dry eyes and mouth with what sounds like possible abrasion but no past positive biopsy specific for sjogren syndrome. Previous autoimmune serology has been somewhat variable, with mixed positive markers and sometimes negative ANA. CRP has been persistently elevated. She reports occasional oral ulcers usually on sides. She denies lymphadenopathy, focal alopecia, photosensitive skin rashes or residual hyperpigmentation, or history of blood clots.   Previous patient of Dr. Annamaria Boots with [facility] problems treated reviewed including ADD, hypothyroidism, chronic back pain, fatigue, depression, and vitamin D deficiency.   Labs reviewed 05/2020 TSH 13.4 fT4 1.07   02/2020 ANA neg RNA Poly III neg Scl-70 neg CCP 84 hsCRP  7.98 HLA-B27 neg BMP eGFR 52   08/2018 hsCRP >10.0 TSH 20.28     Imaging reviewed 06/19/20 MRI Lumbar spine IMPRESSION: Convex right scoliosis. Right greater than left subarticular recess narrowing at L1-2 due to bilateral disc protrusions. Mild left subarticular recess and moderate left foraminal narrowing at L2-3. Left subarticular recess and foraminal protrusion at L3-4 could impact the descending left L4 root and causes mild to moderate left foraminal narrowing. Moderately severe left and mild-to-moderate right foraminal narrowing at L4-5 where there is moderate central canal stenosis. Mild to moderate right foraminal narrowing L5-S1. The central canal and left foramen are open.   05/08/20 Xray left hip Severe arthritic changes of the left hip.   05/08/20 Xray left knee Minimal arthritic changes of the left knee. No joint effusion. The soft tissues are unremarkable.   No Rheumatology ROS completed.   PMFS History:  Patient Active Problem List   Diagnosis Date Noted   Diplopia 11/26/2021   Overuse of acetaminophen 11/26/2021   Status post left hip replacement 10/17/2020   Unilateral primary osteoarthritis, left hip 10/16/2020   Chronic pain of left knee 10/08/2020   Bilateral hand pain 09/01/2020   Lower back injury 09/01/2020   Idiopathic scoliosis of lumbar spine 09/01/2020   Bipolar affective disorder, current episode mixed ([place]) 08/29/2020   Primary osteoarthritis of left hip 07/30/2020   Essential (primary) hypertension 07/17/2020   HSV (herpes simplex virus) anogenital infection 06/26/2020   Generalized anxiety disorder 06/04/2020   Attention deficit hyperactivity disorder (ADHD) 06/04/2020   Migraine headache with aura 05/17/2020  Sinusitis chronic, frontal 05/17/2020   HLD (hyperlipidemia) 05/17/2020   Elevated C-reactive protein (CRP) 02/13/2020   Other specified arthritis, left knee 02/13/2020   Hip arthritis 02/13/2020   Bipolar disorder ([place]) 02/13/2020    Hypothyroidism (acquired) 01/03/2020   Diarrhea in adult patient 01/03/2020   Brain fog 01/03/2020   Healthcare maintenance 01/03/2020    Past Medical History:  Diagnosis Date   Allergy    Anxiety    situational    Asthma    yeras ago - not current    Behcet's disease ([place])    Bell's palsy    Cataract    both removed    Depression    situational -    Diplopia    Elevated blood pressure reading    no BP meds currently    Family history of adverse reaction to anesthesia    GERD (gastroesophageal reflux disease)    Graves disease 1991   Heart murmur    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    Hip pain    HLA B27 (HLA B27 positive)    Hyperlipidemia    Hypertension    Knee pain    Memory changes    Morphea scleroderma    Neuromuscular disorder (HCC)    Raynauds    Pneumonia    Psoriatic arthritis ([place])    seen in ears only   Raynaud's disease    Sjogren syndrome with dental involvement ([place])     Family History  Problem Relation Age of Onset   Heart disease Mother    Congestive Heart Failure Mother    Dementia Mother    Heart disease Father    Heart attack Father    Dementia Sister    Hemachromatosis Sister    Parkinson's disease Sister    Allergies Son    Healthy Son    Colon cancer Neg Hx    Colon polyps Neg Hx    Esophageal cancer Neg Hx    Stomach cancer Neg Hx    Rectal cancer Neg Hx    Past Surgical History:  Procedure Laterality Date   CYST REMOVAL HAND     left arm posterior   laproscopy     SALPINGECTOMY  1980   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   Procedure: LEFT TOTAL HIP ARTHROPLASTY ANTERIOR APPROACH;  Surgeon: Mcarthur Rossetti, MD;  Location: [facility];  Service: Orthopedics;  Laterality: Left;   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     Social History   Social History Narrative   Not on file   Immunization History  Administered Date(s) Administered   Fluad Quad(high Dose 65+) 11/26/2021    Moderna Sars-Covid-2 Vaccination 11/29/2019, 12/25/2019   PFIZER Comirnaty(Gray Top)Covid-19 Tri-Sucrose Vaccine 06/24/2020   PNEUMOCOCCAL CONJUGATE-20 11/26/2021     Objective: Vital Signs: There were no vitals taken for this visit.   Physical Exam   Musculoskeletal Exam: ***  CDAI Exam: CDAI Score: -- Patient Global: --; Provider Global: -- Swollen: --; Tender: -- Joint Exam 02/17/2022   No joint exam has been documented for this visit   There is currently no information documented on the homunculus. Go to the Rheumatology activity and complete the homunculus joint exam.  Investigation: No additional findings.  Imaging: No results found.  Recent Labs: Lab Results  Component Value Date   WBC 16.8 (H) 10/18/2020   HGB 10.7 (L) 10/18/2020   PLT 319 10/18/2020   NA 141 11/26/2021   K  3.8 11/26/2021   CL 104 11/26/2021   CO2 18 (L) 11/26/2021   GLUCOSE 115 (H) 11/26/2021   BUN 23 11/26/2021   CREATININE 0.99 11/26/2021   BILITOT <0.2 11/26/2021   ALKPHOS 150 (H) 11/26/2021   AST 21 11/26/2021   ALT 24 11/26/2021   PROT 7.0 11/26/2021   ALBUMIN 4.6 11/26/2021   CALCIUM 9.2 11/26/2021   GFRAA 60 02/12/2020    Speciality Comments: No specialty comments available.  Procedures:  No procedures performed Allergies: Contrast media [iodinated contrast media], Prohance [gadoteridol], Ptu [propylthiouracil], Sulfa antibiotics, Tapazole [methimazole], and Latex   Assessment / Plan:     Visit Diagnoses: No diagnosis found.  ***  Orders: No orders of the defined types were placed in this encounter.  No orders of the defined types were placed in this encounter.    Follow-Up Instructions: No follow-ups on file.   Collier Salina, MD  Note - This record has been created using [employer].  Chart creation errors have been sought, but may not always  have been located. Such creation errors do not reflect on  the standard of medical care.

## 2022-02-18 ENCOUNTER — Telehealth (HOSPITAL_COMMUNITY): Payer: Self-pay | Admitting: *Deleted

## 2022-02-18 DIAGNOSIS — F909 Attention-deficit hyperactivity disorder, unspecified type: Secondary | ICD-10-CM

## 2022-02-18 NOTE — Telephone Encounter (Signed)
Patient called asking for refill of Adderall. Chart reviewed, no appointments scheduled. Message sent to MD for review.

## 2022-02-19 MED ORDER — AMPHETAMINE-DEXTROAMPHETAMINE 10 MG PO TABS: 10.0000 mg | ORAL_TABLET | Freq: Two times a day (BID) | ORAL | 0 refills | Status: DC

## 2022-02-19 NOTE — Telephone Encounter (Signed)
Received message that patient requested refill of Adderall.  Per PDMP last fill was 1/17.  Refill will be sent.   Sent: -Adderall 10 mg BID.  60 tablets with 0 refills.   Fatima Sanger MD Resident

## 2022-02-26 ENCOUNTER — Other Ambulatory Visit: Payer: Self-pay | Admitting: Family Medicine

## 2022-02-26 MED ORDER — OMEPRAZOLE 20 MG PO CPDR: 20.0000 mg | DELAYED_RELEASE_CAPSULE | Freq: Every day | ORAL | 0 refills | Status: DC

## 2022-03-07 ENCOUNTER — Other Ambulatory Visit: Payer: Self-pay | Admitting: Student

## 2022-03-07 DIAGNOSIS — F411 Generalized anxiety disorder: Secondary | ICD-10-CM

## 2022-03-07 DIAGNOSIS — F3160 Bipolar disorder, current episode mixed, unspecified: Secondary | ICD-10-CM

## 2022-03-07 NOTE — Progress Notes (Signed)
I connected with  Jodi Gilmore on 03/08/2022 by a audio enabled telemedicine application and verified that I am speaking with the correct person using two identifiers.  Patient Location: Home  Provider Location: Home Office  I discussed the limitations of evaluation and management by telemedicine. The patient expressed understanding and agreed to proceed.   Subjective:   Jodi Gilmore is a 67 y.o. female who presents for an Initial Medicare Annual Wellness Visit.  Review of Systems    Per HPI unless specifically indicated below.  Cardiac Risk Factors include: advanced age (>59men, >72 women);female gender, Essential hypertension, and Hyperlipidemia.          Objective:       11/26/2021    2:02 PM 10/19/2020    4:40 AM 10/18/2020    8:29 PM  Vitals with BMI  Weight 194 lbs 5 oz    Systolic 123 114 409  Diastolic 79 62 60  Pulse 108 95 89     There were no vitals filed for this visit. There is no height or weight on file to calculate BMI.     10/17/2020    2:30 PM 10/07/2020    2:28 PM 06/24/2020    1:46 PM 05/15/2020    1:40 PM 01/04/2020    3:55 AM  Advanced Directives  Does Patient Have a Medical Advance Directive? No No No No No  Would patient like information on creating a medical advance directive? No - Patient declined  No - Patient declined No - Patient declined     Current Medications (verified) Outpatient Encounter Medications as of 03/08/2022  Medication Sig   acetaminophen (TYLENOL) 325 MG tablet Take 650 mg by mouth every 6 (six) hours as needed for moderate pain.   amphetamine-dextroamphetamine (ADDERALL) 10 MG tablet Take 1 tablet (10 mg total) by mouth 2 (two) times daily with a meal.   aspirin-acetaminophen-caffeine (EXCEDRIN MIGRAINE) 250-250-65 MG tablet Take 2 tablets by mouth every 6 (six) hours as needed for headache.   atorvastatin (LIPITOR) 40 MG tablet TAKE ONE TABLET BY MOUTH ONE TIME DAILY   B Complex Vitamins (VITAMIN B COMPLEX PO) Take 1  tablet by mouth daily. (Patient not taking: Reported on 11/26/2021)   Bacillus Coagulans-Inulin (PROBIOTIC-PREBIOTIC PO) Take 1 capsule by mouth daily. (Patient not taking: Reported on 11/26/2021)   busPIRone (BUSPAR) 10 MG tablet Take 1 tablet (10 mg total) by mouth 3 (three) times daily.   cetirizine (ZYRTEC ALLERGY) 10 MG tablet Take 1 tablet (10 mg total) by mouth daily.   Cholecalciferol (VITAMIN D) 125 MCG (5000 UT) CAPS Take 10,000 Units by mouth daily. (Patient not taking: Reported on 11/26/2021)   diclofenac Sodium (VOLTAREN) 1 % GEL Apply 4 g topically 4 (four) times daily. (Patient not taking: Reported on 11/26/2021)   FEVERFEW PO Take 1 tablet by mouth 3 (three) times daily. (Patient not taking: Reported on 11/26/2021)   fluticasone (FLONASE) 50 MCG/ACT nasal spray USE TWO SPRAYS IN EACH NOSTRIL ONE TIME DAILY   levothyroxine (SYNTHROID) 112 MCG tablet Take 1 tablet (112 mcg total) by mouth every morning. 30 minutes before food   lisinopril (ZESTRIL) 2.5 MG tablet TAKE ONE TABLET BY MOUTH AT BEDTIME   Magnesium Oxide (MAG-OX 400 PO) Take 400 mg by mouth in the morning and at bedtime. (Patient not taking: Reported on 11/26/2021)   Melatonin 1 MG SUBL  (Patient not taking: Reported on 11/26/2021)   methocarbamol (ROBAXIN) 500 MG tablet TAKE ONE TABLET BY MOUTH EVERY 6  HOURS AS NEEDED FOR MUSCLE SPASM   Multiple Vitamin (MULTIVITAMIN) tablet Take 1 tablet by mouth daily. (Patient not taking: Reported on 11/26/2021)   Omega-3 Fatty Acids (OMEGA-3 EPA FISH OIL PO) Take 3 capsules by mouth daily. (Patient not taking: Reported on 11/26/2021)   omeprazole (PRILOSEC) 20 MG capsule Take 1 capsule (20 mg total) by mouth daily.   ondansetron (ZOFRAN) 4 MG tablet Take 1 tablet (4 mg total) by mouth every 8 (eight) hours as needed for nausea or vomiting.   OVER THE COUNTER MEDICATION Take 4 capsules by mouth at bedtime. Calm Forte   PARoxetine (PAXIL) 20 MG tablet Take 1 tablet (20 mg total) by mouth  daily.   Potassium Bicarbonate 99 MG CAPS Take 99 mg by mouth 3 (three) times daily. (Patient not taking: Reported on 11/26/2021)   QUEtiapine (SEROQUEL) 400 MG tablet Take 1 tablet (400 mg total) by mouth at bedtime.   RESTASIS 0.05 % ophthalmic emulsion INSTILL ONE DROP INTO EACH EYE TWICE DAILY   rizatriptan (MAXALT) 10 MG tablet TAKE ONE TABLET BY MOUTH AT THE ONSET OF HEADACHE. MAY REPEAT WITH ONE TABLET IN 2 HOURS IF NEEDED. DO NOT EXCEED 2 TABLETS IN 24 HOURS.   TURMERIC PO Take 1 capsule by mouth in the morning, at noon, and at bedtime. (Patient not taking: Reported on 11/26/2021)   valACYclovir (VALTREX) 500 MG tablet TAKE ONE TABLET BY MOUTH TWICE A DAY FOR 7 DAYS   No facility-administered encounter medications on file as of 03/08/2022.    Allergies (verified) Contrast media [iodinated contrast media], Prohance [gadoteridol], Ptu [propylthiouracil], Sulfa antibiotics, Tapazole [methimazole], and Latex   History: Past Medical History:  Diagnosis Date   Allergy    Anxiety    situational    Asthma    yeras ago - not current    Behcet's disease (HCC)    Bell's palsy    Cataract    both removed    Depression    situational -    Diplopia    Elevated blood pressure reading    no BP meds currently    Family history of adverse reaction to anesthesia    GERD (gastroesophageal reflux disease)    Graves disease 1991   Heart murmur    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    Hip pain    HLA B27 (HLA B27 positive)    Hyperlipidemia    Hypertension    Knee pain    Memory changes    Morphea scleroderma    Neuromuscular disorder (HCC)    Raynauds    Pneumonia    Psoriatic arthritis (HCC)    seen in ears only   Raynaud's disease    Sjogren syndrome with dental involvement (HCC)    Past Surgical History:  Procedure Laterality Date   CYST REMOVAL HAND     left arm posterior   laproscopy     SALPINGECTOMY  1980   TOTAL HIP  ARTHROPLASTY Left 10/17/2020   Procedure: LEFT TOTAL HIP ARTHROPLASTY ANTERIOR APPROACH;  Surgeon: Kathryne Hitch, MD;  Location: [facility];  Service: Orthopedics;  Laterality: Left;   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     Family History  Problem Relation Age of Onset   Heart disease Mother    Congestive Heart Failure Mother    Dementia Mother    Heart disease Father    Heart attack Father    Dementia Sister    Hemachromatosis Sister  Parkinson's disease Sister    Allergies Son    Healthy Son    Colon cancer Neg Hx    Colon polyps Neg Hx    Esophageal cancer Neg Hx    Stomach cancer Neg Hx    Rectal cancer Neg Hx    Social History   Socioeconomic History   Marital status: Single    Spouse name: Not on file   Number of children: Not on file   Years of education: Not on file   Highest education level: Not on file  Occupational History   Not on file  Tobacco Use   Smoking status: Former   Smokeless tobacco: Never  Vaping Use   Vaping Use: Never used  Substance and Sexual Activity   Alcohol use: Not Currently   Drug use: Never   Sexual activity: Not on file  Other Topics Concern   Not on file  Social History Narrative   Not on file   Social Determinants of Health   Financial Resource Strain: Not on file  Food Insecurity: Not on file  Transportation Needs: Not on file  Physical Activity: Not on file  Stress: Not on file  Social Connections: Not on file    Tobacco Counseling Counseling given: Not Answered   Clinical Intake:                 Diabetic? no         Activities of Daily Living     No data to display          Patient Care Team: Alicia Amel, MD as PCP - General (Family Medicine)  Indicate any recent Medical Services you may have received from other than [facility] providers in the past year (date may be approximate).     Assessment:   This is a routine wellness examination for Jodi Gilmore.  Hearing/Vision screen No results  found.  Dietary issues and exercise activities discussed:     Goals Addressed   None    Depression Screen    11/26/2021    4:17 PM 08/26/2021    5:17 PM 06/24/2021    5:19 PM 04/22/2021    5:21 PM 03/20/2021   10:16 AM 02/19/2021    4:49 PM 12/16/2020    4:47 PM  PHQ 2/9 Scores  PHQ - 2 Score 4        PHQ- 9 Score 13           Information is confidential and restricted. Go to Review Flowsheets to unlock data.    Fall Risk    11/26/2021    2:03 PM 06/24/2020    1:42 PM 05/15/2020    1:39 PM  Fall Risk   Falls in the past year? 1 0 0  Number falls in past yr: 1 0 0  Injury with Fall? 0 0     FALL RISK PREVENTION PERTAINING TO THE HOME:  Any stairs in or around the home? {YES/NO:21197} If so, are there any without handrails? {YES/NO:21197} Home free of loose throw rugs in walkways, pet beds, electrical cords, etc? {YES/NO:21197} Adequate lighting in your home to reduce risk of falls? {YES/NO:21197}  ASSISTIVE DEVICES UTILIZED TO PREVENT FALLS:  Life alert? {YES/NO:21197} Use of a cane, walker or w/c? {YES/NO:21197} Grab bars in the bathroom? {YES/NO:21197} Shower chair or bench in shower? {YES/NO:21197} Elevated toilet seat or a handicapped toilet? {YES/NO:21197}  TIMED UP AND GO:  Was the test performed? Unable to perform, virtual appointment   Cognitive Function:  Immunizations Immunization History  Administered Date(s) Administered   Fluad Quad(high Dose 65+) 11/26/2021   Moderna Sars-Covid-2 Vaccination 11/29/2019, 12/25/2019   PFIZER Comirnaty(Gray Top)Covid-19 Tri-Sucrose Vaccine 06/24/2020   PNEUMOCOCCAL CONJUGATE-20 11/26/2021    TDAP status: Due, Education has been provided regarding the importance of this vaccine. Advised may receive this vaccine at local pharmacy or Health Dept. Aware to provide a copy of the vaccination record if obtained from local pharmacy or Health Dept. Verbalized acceptance and understanding.  Flu Vaccine status: Up to  date  Pneumococcal vaccine status: Up to date  Covid-19 vaccine status: Information provided on how to obtain vaccines.   Qualifies for Shingles Vaccine? Yes   Zostavax completed No   Shingrix Completed?: No.    Education has been provided regarding the importance of this vaccine. Patient has been advised to call insurance company to determine out of pocket expense if they have not yet received this vaccine. Advised may also receive vaccine at local pharmacy or Health Dept. Verbalized acceptance and understanding.  Screening Tests Health Maintenance  Topic Date Due   Medicare Annual Wellness (AWV)  Never done   DTaP/Tdap/Td (1 - Tdap) Never done   MAMMOGRAM  Never done   Zoster Vaccines- Shingrix (1 of 2) Never done   DEXA SCAN  Never done   COVID-19 Vaccine (4 - 2023-24 season) 09/11/2021   COLONOSCOPY (Pts 45-57yrs Insurance coverage will need to be confirmed)  04/05/2030   Pneumonia Vaccine 37+ Years old  Completed   INFLUENZA VACCINE  Completed   Hepatitis C Screening  Completed   HPV VACCINES  Aged Out    Health Maintenance  Health Maintenance Due  Topic Date Due   Medicare Annual Wellness (AWV)  Never done   DTaP/Tdap/Td (1 - Tdap) Never done   MAMMOGRAM  Never done   Zoster Vaccines- Shingrix (1 of 2) Never done   DEXA SCAN  Never done   COVID-19 Vaccine (4 - 2023-24 season) 09/11/2021    Colorectal cancer screening: Type of screening: Colonoscopy. Completed 04/04/2020. Repeat every 10 years  Mammogram status: Ordered and scheduled 03/24/2022. Pt provided with contact info and advised to call to schedule appt.   DEXA Scan: Overdue   Lung Cancer Screening: (Low Dose CT Chest recommended if Age 39-80 years, 30 pack-year currently smoking OR have quit w/in 15years.) does not qualify.   Lung Cancer Screening Referral: not applicable   Additional Screening:  Hepatitis C Screening: does qualify; Completed 01/01/2020  Vision Screening: Recommended annual  ophthalmology exams for early detection of glaucoma and other disorders of the eye. Is the patient up to date with their annual eye exam?  {YES/NO:21197} Who is the provider or what is the name of the office in which the patient attends annual eye exams? *** If pt is not established with a provider, would they like to be referred to a provider to establish care? {YES/NO:21197}.   Dental Screening: Recommended annual dental exams for proper oral hygiene  Community Resource Referral / Chronic Care Management: CRR required this visit?  No   CCM required this visit?  No      Plan:     I have personally reviewed and noted the following in the patient's chart:   Medical and social history Use of alcohol, tobacco or illicit drugs  Current medications and supplements including opioid prescriptions. Patient is not currently taking opioid prescriptions. Functional ability and status Nutritional status Physical activity Advanced directives List of other physicians Hospitalizations, surgeries, and ER  visits in previous 12 months Vitals Screenings to include cognitive, depression, and falls Referrals and appointments  In addition, I have reviewed and discussed with patient certain preventive protocols, quality metrics, and best practice recommendations. A written personalized care plan for preventive services as well as general preventive health recommendations were provided to patient.    Jodi Gilmore , Thank you for taking time to come for your Medicare Wellness Visit. I appreciate your ongoing commitment to your health goals. Please review the following plan we discussed and let me know if I can assist you in the future.   These are the goals we discussed:  Goals   None     This is a list of the screening recommended for you and due dates:  Health Maintenance  Topic Date Due   DTaP/Tdap/Td vaccine (1 - Tdap) Never done   Mammogram  Never done   Zoster (Shingles) Vaccine (1 of 2)  Never done   DEXA scan (bone density measurement)  Never done   COVID-19 Vaccine (4 - 2023-24 season) 09/11/2021   Medicare Annual Wellness Visit  03/09/2023   Colon Cancer Screening  04/05/2030   Pneumonia Vaccine  Completed   Flu Shot  Completed   Hepatitis C Screening: USPSTF Recommendation to screen - Ages 18-79 yo.  Completed   HPV Vaccine  Aged [address] [place]   03/08/2022  Nurse Notes: Approximately 30 minute Non-Face -To-Face Medicare Wellness Visit

## 2022-03-07 NOTE — Patient Instructions (Signed)

## 2022-03-08 ENCOUNTER — Ambulatory Visit (INDEPENDENT_AMBULATORY_CARE_PROVIDER_SITE_OTHER): Payer: Medicare Other

## 2022-03-08 DIAGNOSIS — Z0000 Encounter for general adult medical examination without abnormal findings: Secondary | ICD-10-CM

## 2022-03-08 MED ORDER — PAROXETINE HCL 20 MG PO TABS: 20.0000 mg | ORAL_TABLET | Freq: Every day | ORAL | 1 refills | Status: DC

## 2022-03-08 NOTE — Telephone Encounter (Signed)
Rx REFILL REQUEST-- [facility] [address], [place]. AT [place]    PARoxetine (PAXIL) 20 MG tablet   NEXT VISIT  NO FUTURE APPT'S LAST VISIT   12/15/21

## 2022-03-08 NOTE — Progress Notes (Unsigned)
I connected with  [name] on 03/09/2022 by a audio enabled telemedicine application and verified that I am speaking with the correct person using two identifiers.  Patient Location: Home  Provider Location: Home Office  I discussed the limitations of evaluation and management by telemedicine. The patient expressed understanding and agreed to proceed.  Subjective:   Jodi Gilmore is a 67 y.o. female who presents for an Initial Medicare Annual Wellness Visit.  Review of Systems    Per HPI unless specifically indicated below.  Cardiac Risk Factors include: advanced age (>62mn, >>87women);female gender, Essential hypertension, and Hyperlipidemia.         Objective:       11/26/2021    2:02 PM 10/19/2020    4:40 AM 10/18/2020    8:29 PM  Vitals with BMI  Weight 194 lbs 5 oz    Systolic [id number] Diastolic 79 62 60  Pulse [id number]    Today's Vitals   03/09/22 1116  PainSc: 6    There is no height or weight on file to calculate BMI.     03/09/2022   11:31 AM 10/17/2020    2:30 PM 10/07/2020    2:28 PM 06/24/2020    1:46 PM 05/15/2020    1:40 PM 01/04/2020    3:55 AM  Advanced Directives  Does Patient Have a Medical Advance Directive? No No No No No No  Would patient like information on creating a medical advance directive? No - Patient declined No - Patient declined  No - Patient declined No - Patient declined     Current Medications (verified) Outpatient Encounter Medications as of 03/09/2022  Medication Sig   acetaminophen (TYLENOL) 325 MG tablet Take 650 mg by mouth every 6 (six) hours as needed for moderate pain.   amphetamine-dextroamphetamine (ADDERALL) 10 MG tablet Take 1 tablet (10 mg total) by mouth 2 (two) times daily with a meal.   aspirin-acetaminophen-caffeine (EXCEDRIN MIGRAINE) 250-250-65 MG tablet Take 2 tablets by mouth every 6 (six) hours as needed for headache.   atorvastatin (LIPITOR) 40 MG tablet TAKE ONE TABLET BY MOUTH ONE TIME DAILY    B Complex Vitamins (VITAMIN B COMPLEX PO) Take 1 tablet by mouth daily.   Bacillus Coagulans-Inulin (PROBIOTIC-PREBIOTIC PO) Take 1 capsule by mouth daily.   busPIRone (BUSPAR) 10 MG tablet Take 1 tablet (10 mg total) by mouth 3 (three) times daily.   cetirizine (ZYRTEC ALLERGY) 10 MG tablet Take 1 tablet (10 mg total) by mouth daily.   Cholecalciferol (VITAMIN D) 125 MCG (5000 UT) CAPS Take 10,000 Units by mouth daily.   diclofenac Sodium (VOLTAREN) 1 % GEL Apply 4 g topically 4 (four) times daily.   FEVERFEW PO Take 1 tablet by mouth 3 (three) times daily.   fluticasone (FLONASE) 50 MCG/ACT nasal spray USE TWO SPRAYS IN EACH NOSTRIL ONE TIME DAILY   levothyroxine (SYNTHROID) 112 MCG tablet Take 1 tablet (112 mcg total) by mouth every morning. 30 minutes before food   lisinopril (ZESTRIL) 2.5 MG tablet TAKE ONE TABLET BY MOUTH AT BEDTIME   Magnesium Oxide (MAG-OX 400 PO) Take 400 mg by mouth in the morning and at bedtime.   Melatonin 1 MG SUBL    methocarbamol (ROBAXIN) 500 MG tablet TAKE ONE TABLET BY MOUTH EVERY 6 HOURS AS NEEDED FOR MUSCLE SPASM   Multiple Vitamin (MULTIVITAMIN) tablet Take 1 tablet by mouth daily.   Omega-3 Fatty Acids (OMEGA-3 EPA FISH OIL PO) Take 3  capsules by mouth daily.   omeprazole (PRILOSEC) 20 MG capsule Take 1 capsule (20 mg total) by mouth daily. (Patient taking differently: Take 20 mg by mouth 2 (two) times daily before a meal.)   ondansetron (ZOFRAN) 4 MG tablet Take 1 tablet (4 mg total) by mouth every 8 (eight) hours as needed for nausea or vomiting.   OVER THE COUNTER MEDICATION Take 4 capsules by mouth at bedtime. Calm Forte   PARoxetine (PAXIL) 20 MG tablet Take 1 tablet (20 mg total) by mouth daily.   Potassium Bicarbonate 99 MG CAPS Take 99 mg by mouth 3 (three) times daily.   QUEtiapine (SEROQUEL) 400 MG tablet Take 1 tablet (400 mg total) by mouth at bedtime.   RESTASIS 0.05 % ophthalmic emulsion INSTILL ONE DROP INTO EACH EYE TWICE DAILY    rizatriptan (MAXALT) 10 MG tablet TAKE ONE TABLET BY MOUTH AT THE ONSET OF HEADACHE. MAY REPEAT WITH ONE TABLET IN 2 HOURS IF NEEDED. DO NOT EXCEED 2 TABLETS IN 24 HOURS.   TURMERIC PO Take 1 capsule by mouth in the morning, at noon, and at bedtime.   valACYclovir (VALTREX) 500 MG tablet TAKE ONE TABLET BY MOUTH TWICE A DAY FOR 7 DAYS   No facility-administered encounter medications on file as of 03/09/2022.    Allergies (verified) Contrast media [iodinated contrast media], Prohance [gadoteridol], Ptu [propylthiouracil], Sulfa antibiotics, Tapazole [methimazole], and Latex   History: Past Medical History:  Diagnosis Date   Allergy    Anxiety    situational    Asthma    yeras ago - not current    Behcet's disease ([place])    Bell's palsy    Cataract    both removed    Depression    situational -    Diplopia    Elevated blood pressure reading    no BP meds currently    Family history of adverse reaction to anesthesia    GERD (gastroesophageal reflux disease)    Graves disease 1991   Heart murmur    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    Hip pain    HLA B27 (HLA B27 positive)    Hyperlipidemia    Hypertension    Knee pain    Memory changes    Morphea scleroderma    Neuromuscular disorder (HCC)    Raynauds    Pneumonia    Psoriatic arthritis (HCC)    seen in ears only   Raynaud's disease    Sjogren syndrome with dental involvement ([place])    Past Surgical History:  Procedure Laterality Date   CYST REMOVAL HAND     left arm posterior   laproscopy     SALPINGECTOMY  1980   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   Procedure: LEFT TOTAL HIP ARTHROPLASTY ANTERIOR APPROACH;  Surgeon: Mcarthur Rossetti, MD;  Location: [facility];  Service: Orthopedics;  Laterality: Left;   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     Family History  Problem Relation Age of Onset   Heart disease Mother    Congestive Heart Failure Mother    Dementia Mother     Heart disease Father    Heart attack Father    Dementia Sister    Hemachromatosis Sister    Parkinson's disease Sister    Allergies Son    Healthy Son    Colon cancer Neg Hx    Colon polyps Neg Hx    Esophageal cancer Neg Hx  Stomach cancer Neg Hx    Rectal cancer Neg Hx    Social History   Socioeconomic History   Marital status: Single    Spouse name: Not on file   Number of children: Not on file   Years of education: Not on file   Highest education level: Not on file  Occupational History   Occupation: Unemployed  Tobacco Use   Smoking status: Former   Smokeless tobacco: Never  Vaping Use   Vaping Use: Never used  Substance and Sexual Activity   Alcohol use: Not Currently   Drug use: Never   Sexual activity: Not on file  Other Topics Concern   Not on file  Social History Narrative   Not on file   Social Determinants of Health   Financial Resource Strain: [place] (03/09/2022)   Overall Financial Resource Strain (CARDIA)    Difficulty of Paying Living Expenses: Somewhat hard  Food Insecurity: No Food Insecurity (03/09/2022)   Hunger Vital Sign    Worried About Running Out of Food in the Last Year: Never true    Ran Out of Food in the Last Year: Never true  Transportation Needs: Unmet Transportation Needs (03/09/2022)   PRAPARE - Transportation    Lack of Transportation (Medical): Yes    Lack of Transportation (Non-Medical): Yes  Physical Activity: Inactive (03/09/2022)   Exercise Vital Sign    Days of Exercise per Week: 0 days    Minutes of Exercise per Session: 0 min  Stress: Stress Concern Present (03/09/2022)   [employer] [place]    Feeling of Stress : To some extent  Social Connections: Socially Isolated (03/09/2022)   Social Connection and Isolation Panel [NHANES]    Frequency of Communication with Friends and Family: Once a week    Frequency of Social Gatherings with Friends and Family: Never     Attends Religious Services: Never    Printmaker: No    Attends Music therapist: Never    Marital Status: Never married    Tobacco Counseling Counseling given: Not Answered   Clinical Intake:  Pre-visit preparation completed: No  Pain : 0-10 Pain Score: 6  Pain Type: Chronic pain Pain Location: Back Pain Orientation: Lower Pain Descriptors / Indicators: Aching Pain Onset: More than a month ago     Nutritional Status: BMI 25 -29 Overweight Nutritional Risks: Unintentional weight gain Diabetes: No  How often do you need to have someone help you when you read instructions, pamphlets, or other written materials from your doctor or pharmacy?: 1 - Never  Diabetic?No     Information entered by :: Donnie Mesa, CMA   Activities of Daily Living    03/09/2022   11:15 AM  In your present state of health, do you have any difficulty performing the following activities:  Hearing? 0  Vision? 1  Difficulty concentrating or making decisions? 1  Walking or climbing stairs? 1  Dressing or bathing? 1  Doing errands, shopping? 1    Patient Care Team: Eppie Gibson, MD as PCP - General (Family Medicine)  Indicate any recent Medical Services you may have received from other than [facility] providers in the past year (date may be approximate).     Assessment:   This is a routine wellness examination for Geriah.   Hearing/Vision screen Denies any hearing issues. Vision disturbance. Diagnosed with Binocular Diplopia  Annual Eye Exam.   Dietary issues and exercise activities  discussed:     Goals Addressed   None    Depression Screen    03/09/2022   11:13 AM 11/26/2021    4:17 PM 08/26/2021    5:17 PM 06/24/2021    5:19 PM 04/22/2021    5:21 PM 03/20/2021   10:16 AM 02/19/2021    4:49 PM  PHQ 2/9 Scores  PHQ - 2 Score 2 4       PHQ- 9 Score 15 13          Information is confidential and restricted. Go to Review  Flowsheets to unlock data.    Fall Risk    03/09/2022   11:15 AM 11/26/2021    2:03 PM 06/24/2020    1:42 PM 05/15/2020    1:39 PM  Fall Risk   Falls in the past year? 1 1 0 0  Number falls in past yr: 1 1 0 0  Injury with Fall? 1 0 0   Risk for fall due to : Impaired vision     Follow up Falls evaluation completed       FALL RISK PREVENTION PERTAINING TO THE HOME:  Any stairs in or around the home? No  If so, are there any without handrails? No  Home free of loose throw rugs in walkways, pet beds, electrical cords, etc? Yes  Adequate lighting in your home to reduce risk of falls? Yes   ASSISTIVE DEVICES UTILIZED TO PREVENT FALLS:  Life alert? No  Use of a cane, walker or w/c? Yes  Grab bars in the bathroom? No  Shower chair or bench in shower? Yes  Elevated toilet seat or a handicapped toilet? Yes   TIMED UP AND GO:  Was the test performed?Unable to perform, virtual appointment   Cognitive Function:        03/09/2022   11:21 AM  6CIT Screen  What Year? 0 points  What month? 0 points  What time? 0 points  Count back from 20 0 points  Months in reverse 0 points  Repeat phrase 0 points  Total Score 0 points    Immunizations Immunization History  Administered Date(s) Administered   Fluad Quad(high Dose 65+) 11/26/2021   Moderna Sars-Covid-2 Vaccination 11/29/2019, 12/25/2019   PFIZER Comirnaty(Gray Top)Covid-19 Tri-Sucrose Vaccine 06/24/2020   PNEUMOCOCCAL CONJUGATE-20 11/26/2021    TDAP status: Due, Education has been provided regarding the importance of this vaccine. Advised may receive this vaccine at local pharmacy or Health Dept. Aware to provide a copy of the vaccination record if obtained from local pharmacy or Health Dept. Verbalized acceptance and understanding.  Flu Vaccine status: Up to date  Pneumococcal vaccine status: Up to date  Covid-19 vaccine status: Information provided on how to obtain vaccines.   Qualifies for Shingles Vaccine? Yes    Zostavax completed No   Shingrix Completed?: No.    Education has been provided regarding the importance of this vaccine. Patient has been advised to call insurance company to determine out of pocket expense if they have not yet received this vaccine. Advised may also receive vaccine at local pharmacy or Health Dept. Verbalized acceptance and understanding.  Screening Tests Health Maintenance  Topic Date Due   DTaP/Tdap/Td (1 - Tdap) Never done   MAMMOGRAM  Never done   Zoster Vaccines- Shingrix (1 of 2) Never done   DEXA SCAN  Never done   COVID-19 Vaccine (4 - 2023-24 season) 09/11/2021   Medicare Annual Wellness (AWV)  03/10/2023   COLONOSCOPY (Pts 45-92yr Insurance coverage  will need to be confirmed)  04/05/2030   Pneumonia Vaccine 45+ Years old  Completed   INFLUENZA VACCINE  Completed   Hepatitis C Screening  Completed   HPV VACCINES  Aged Out    Health Maintenance  Health Maintenance Due  Topic Date Due   DTaP/Tdap/Td (1 - Tdap) Never done   MAMMOGRAM  Never done   Zoster Vaccines- Shingrix (1 of 2) Never done   DEXA SCAN  Never done   COVID-19 Vaccine (4 - 2023-24 season) 09/11/2021    Colorectal cancer screening: Type of screening: Colonoscopy. Completed 04/04/2020. Repeat every 10 years  Mammogram status: Ordered and scheduled for 03/24/2022. Pt provided with contact info and advised to call to schedule appt.   DEXA Scan: overdue  Lung Cancer Screening: (Low Dose CT Chest recommended if Age 77-80 years, 30 pack-year currently smoking OR have quit w/in 15years.) does not qualify.   Lung Cancer Screening Referral: not applicable   Additional Screening:  Hepatitis C Screening: does qualify; Completed 01/01/2020  Vision Screening: Recommended annual ophthalmology exams for early detection of glaucoma and other disorders of the eye. Is the patient up to date with their annual eye exam?  Yes  Who is the provider or what is the name of the office in which the  patient attends annual eye exams?  If pt is not established with a provider, would they like to be referred to a provider to establish care? No .   Dental Screening: Recommended annual dental exams for proper oral hygiene  Community Resource Referral / Chronic Care Management: CRR required this visit?  No   CCM required this visit?  No      Plan:     I have personally reviewed and noted the following in the patient's chart:   Medical and social history Use of alcohol, tobacco or illicit drugs  Current medications and supplements including opioid prescriptions. Patient is not currently taking opioid prescriptions. Functional ability and status Nutritional status Physical activity Advanced directives List of other physicians Hospitalizations, surgeries, and ER visits in previous 12 months Vitals Screenings to include cognitive, depression, and falls Referrals and appointments  In addition, I have reviewed and discussed with patient certain preventive protocols, quality metrics, and best practice recommendations. A written personalized care plan for preventive services as well as general preventive health recommendations were provided to patient.     Wilson Singer, [place]   03/09/2022  Nurse Notes: Approximately 30 minute Non-Face -To-Face Medicare Wellness Visit        I have reviewed this visit and agree with the documentation.  Marland Kitchenmee

## 2022-03-08 NOTE — Patient Instructions (Signed)

## 2022-03-08 NOTE — Telephone Encounter (Signed)
Received message that patient needed refill of her Paxil.  This was sent in.    Sent: -Paxil 20 mg daily.  30 tablets with 1 refill.   Fatima Sanger MD Resident

## 2022-03-09 ENCOUNTER — Other Ambulatory Visit: Payer: Self-pay | Admitting: Student

## 2022-03-09 ENCOUNTER — Ambulatory Visit (INDEPENDENT_AMBULATORY_CARE_PROVIDER_SITE_OTHER): Payer: Medicare Other

## 2022-03-09 DIAGNOSIS — Z0000 Encounter for general adult medical examination without abnormal findings: Secondary | ICD-10-CM

## 2022-03-09 DIAGNOSIS — Z5982 Transportation insecurity: Secondary | ICD-10-CM

## 2022-03-10 ENCOUNTER — Telehealth: Payer: Self-pay

## 2022-03-10 NOTE — Telephone Encounter (Signed)
   Telephone encounter was:  Successful.  03/10/2022 Name: Tyshera Pratts MRN: [id number] DOB: 03/19/1955  Maille Scheuring is a 67 y.o. year old female who is a primary care patient of Eppie Gibson, MD . The community resource team was consulted for assistance with Transportation Needs   Care guide performed the following interventions: Spoke with patient about [employer] transportation. Verified mailing address to send application. Letter saved in Epic.  Follow Up Plan:  No further follow up planned at this time. The patient has been provided with needed resources.  [place] Resource Care Guide   [email or web address]  ?? [id number]   Website: [email or web address]  [email or web address]

## 2022-03-12 ENCOUNTER — Telehealth: Payer: Self-pay

## 2022-03-12 NOTE — Telephone Encounter (Signed)
Per Artis Delay, pt needs to be seen again before refilling her methocarbamol. I called and advised pt who stated understanding. She will reach out to either her neurologist or pcp.

## 2022-03-15 ENCOUNTER — Ambulatory Visit
Admission: RE | Admit: 2022-03-15 | Discharge: 2022-03-15 | Disposition: A | Discharge status: Home / Self Care | Payer: Medicare Other | Source: Ambulatory Visit | Attending: Neurology | Admitting: Neurology

## 2022-03-15 DIAGNOSIS — H532 Diplopia: Secondary | ICD-10-CM

## 2022-03-17 ENCOUNTER — Telehealth (HOSPITAL_COMMUNITY): Payer: Self-pay

## 2022-03-17 DIAGNOSIS — F909 Attention-deficit hyperactivity disorder, unspecified type: Secondary | ICD-10-CM

## 2022-03-18 ENCOUNTER — Encounter: Payer: Self-pay | Admitting: Radiology

## 2022-03-18 MED ORDER — AMPHETAMINE-DEXTROAMPHETAMINE 10 MG PO TABS: 10.0000 mg | ORAL_TABLET | Freq: Two times a day (BID) | ORAL | 0 refills | Status: DC

## 2022-03-18 NOTE — Telephone Encounter (Signed)
Received message of the need to change medications and asked staff to call patient to schedule an appointment.  Will not send in refill at this time as she will not run out of her prescription until next week.  Will await the scheduling of an appointment.   Fatima Sanger MD Resident

## 2022-03-18 NOTE — Telephone Encounter (Signed)
She has been scheduled for an appointment on 04/09/2022.  Per PDMP last fill of Adderall was 2/14, refill will be sent in to be filled Monday 3/11.   Sent: -Adderall 10 mg BID.  60 tablets with 0 refills.    Fatima Sanger MD Resident

## 2022-03-18 NOTE — Telephone Encounter (Signed)
Fax request for Adderall 10 mg she takes BID. She last filled on 02/24/22. She should be out the next week. I dont see a future appt with a provider and looks like she was last seen in Dec by Dr Kai Levins. Will forward this requet for him to consider.

## 2022-03-18 NOTE — Addendum Note (Signed)
Addended by: Briant Cedar on: 03/18/2022 11:17 AM   Modules accepted: Orders

## 2022-03-22 NOTE — Progress Notes (Deleted)
Office Visit Note  Patient: Jodi Gilmore             Date of Birth: 1955-06-13           MRN: [id number]             PCP: Eppie Gibson, MD Referring: Eppie Gibson, MD Visit Date: 03/23/2022   Subjective:  No chief complaint on file.   History of Present Illness: Jodi Gilmore is a 67 y.o. female here for follow up ***   Previous HPI 09/01/20 Jodi Gilmore is a 67 y.o. female here for evaluation with history of multiple rheumatic conditions apparently RA, sjogren's syndrome, scleroderma, and raynaud's and with history of hypothyroidism. She has a somewhat complicated history with numerous symptoms both focal symptoms including joint pain and swelling, eye and mouth dryness, skin lesions reportedly both psoriasis and behcets pathology previously seen. At the moment skin disease is not very active. Joint pain is worst in the left hip with severe degenerative arthritis already seen at orthopedic surgery clinic. She also has osteoarthritis of other sites with worsening changes of fingers on bilateral hands. However she does have multiple generalized symptoms and has had labile thyroid disease with difficult to control  levothyroxine dose changes. Chronic dry eyes and mouth with what sounds like possible abrasion but no past positive biopsy specific for sjogren syndrome. Previous autoimmune serology has been somewhat variable, with mixed positive markers and sometimes negative ANA. CRP has been persistently elevated. She reports occasional oral ulcers usually on sides. She denies lymphadenopathy, focal alopecia, photosensitive skin rashes or residual hyperpigmentation, or history of blood clots.   Previous patient of Dr. Annamaria Boots with [facility] problems treated reviewed including ADD, hypothyroidism, chronic back pain, fatigue, depression, and vitamin D deficiency.   Labs reviewed 05/2020 TSH 13.4 fT4 1.07   02/2020 ANA neg RNA Poly III neg Scl-70 neg CCP 84 hsCRP  7.98 HLA-B27 neg BMP eGFR 52   08/2018 hsCRP >10.0 TSH 20.28     Imaging reviewed 06/19/20 MRI Lumbar spine IMPRESSION: Convex right scoliosis. Right greater than left subarticular recess narrowing at L1-2 due to bilateral disc protrusions. Mild left subarticular recess and moderate left foraminal narrowing at L2-3. Left subarticular recess and foraminal protrusion at L3-4 could impact the descending left L4 root and causes mild to moderate left foraminal narrowing. Moderately severe left and mild-to-moderate right foraminal narrowing at L4-5 where there is moderate central canal stenosis. Mild to moderate right foraminal narrowing L5-S1. The central canal and left foramen are open.   05/08/20 Xray left hip Severe arthritic changes of the left hip.   05/08/20 Xray left knee Minimal arthritic changes of the left knee. No joint effusion. The soft tissues are unremarkable.   No Rheumatology ROS completed.   PMFS History:  Patient Active Problem List   Diagnosis Date Noted   Diplopia 11/26/2021   Overuse of acetaminophen 11/26/2021   Status post left hip replacement 10/17/2020   Unilateral primary osteoarthritis, left hip 10/16/2020   Chronic pain of left knee 10/08/2020   Bilateral hand pain 09/01/2020   Lower back injury 09/01/2020   Idiopathic scoliosis of lumbar spine 09/01/2020   Bipolar affective disorder, current episode mixed ([place]) 08/29/2020   Primary osteoarthritis of left hip 07/30/2020   Essential (primary) hypertension 07/17/2020   HSV (herpes simplex virus) anogenital infection 06/26/2020   Generalized anxiety disorder 06/04/2020   Attention deficit hyperactivity disorder (ADHD) 06/04/2020   Migraine headache with aura 05/17/2020  Sinusitis chronic, frontal 05/17/2020   HLD (hyperlipidemia) 05/17/2020   Elevated C-reactive protein (CRP) 02/13/2020   Other specified arthritis, left knee 02/13/2020   Hip arthritis 02/13/2020   Bipolar disorder ([place]) 02/13/2020    Hypothyroidism (acquired) 01/03/2020   Diarrhea in adult patient 01/03/2020   Brain fog 01/03/2020   Healthcare maintenance 01/03/2020    Past Medical History:  Diagnosis Date   Allergy    Anxiety    situational    Asthma    yeras ago - not current    Behcet's disease ([place])    Bell's palsy    Cataract    both removed    Depression    situational -    Diplopia    Elevated blood pressure reading    no BP meds currently    Family history of adverse reaction to anesthesia    GERD (gastroesophageal reflux disease)    Graves disease 1991   Heart murmur    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    Hip pain    HLA B27 (HLA B27 positive)    Hyperlipidemia    Hypertension    Knee pain    Memory changes    Morphea scleroderma    Neuromuscular disorder (HCC)    Raynauds    Pneumonia    Psoriatic arthritis ([place])    seen in ears only   Raynaud's disease    Sjogren syndrome with dental involvement ([place])     Family History  Problem Relation Age of Onset   Heart disease Mother    Congestive Heart Failure Mother    Dementia Mother    Heart disease Father    Heart attack Father    Dementia Sister    Hemachromatosis Sister    Parkinson's disease Sister    Allergies Son    Healthy Son    Colon cancer Neg Hx    Colon polyps Neg Hx    Esophageal cancer Neg Hx    Stomach cancer Neg Hx    Rectal cancer Neg Hx    Past Surgical History:  Procedure Laterality Date   CYST REMOVAL HAND     left arm posterior   laproscopy     SALPINGECTOMY  1980   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   Procedure: LEFT TOTAL HIP ARTHROPLASTY ANTERIOR APPROACH;  Surgeon: Mcarthur Rossetti, MD;  Location: [facility];  Service: Orthopedics;  Laterality: Left;   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     Social History   Social History Narrative   Not on file   Immunization History  Administered Date(s) Administered   Fluad Quad(high Dose 65+) 11/26/2021    Moderna Sars-Covid-2 Vaccination 11/29/2019, 12/25/2019   PFIZER Comirnaty(Gray Top)Covid-19 Tri-Sucrose Vaccine 06/24/2020   PNEUMOCOCCAL CONJUGATE-20 11/26/2021     Objective: Vital Signs: There were no vitals taken for this visit.   Physical Exam   Musculoskeletal Exam: ***  CDAI Exam: CDAI Score: -- Patient Global: --; Provider Global: -- Swollen: --; Tender: -- Joint Exam 03/23/2022   No joint exam has been documented for this visit   There is currently no information documented on the homunculus. Go to the Rheumatology activity and complete the homunculus joint exam.  Investigation: No additional findings.  Imaging: CT CHEST WO CONTRAST  Result Date: 03/16/2022 CLINICAL DATA:  Chest pain, cough, difficulty breathing EXAM: CT CHEST WITHOUT CONTRAST TECHNIQUE: Multidetector CT imaging of the chest was performed following the standard protocol without IV contrast.  RADIATION DOSE REDUCTION: This exam was performed according to the departmental dose-optimization program which includes automated exposure control, adjustment of the mA and/or kV according to patient size and/or use of iterative reconstruction technique. COMPARISON:  Chest radiographs done on 01/03/2020 FINDINGS: Cardiovascular: There are scattered coronary artery calcifications. Mediastinum/Nodes: Small hiatal hernia is seen. Small amount of fluid is seen in the lumen of the thoracic esophagus, possibly suggesting gastroesophageal reflux. No significant lymphadenopathy seen. Thyroid is difficult visualize. This may suggest atrophy. Lungs/Pleura: There is no focal pulmonary consolidation. In image 26 of series 8, there is 3 mm pleural-based nodule in posterior right upper lobe. In image 27, there is a 3 mm nodule in the posterior left upper lobe. There is no pleural effusion or pneumothorax. Upper Abdomen: No acute findings are seen. Musculoskeletal: Unremarkable. IMPRESSION: There are few nodules each measuring less than 3  mm in size in both upper lobes. No follow-up needed if patient is low-risk (and has no known or suspected primary neoplasm). Non-contrast chest CT can be considered in 12 months if patient is high-risk. This recommendation follows the consensus statement: Guidelines for Management of Incidental Pulmonary Nodules Detected on CT Images: From the Fleischner Society 2017; Radiology 2017; 284:228-243. There is no focal pulmonary consolidation. No significant lymphadenopathy is seen. Coronary artery calcifications are seen. Small hiatal hernia with gastroesophageal reflux. Electronically Signed   By: Elmer Picker M.D.   On: 03/16/2022 15:58    Recent Labs: Lab Results  Component Value Date   WBC 16.8 (H) 10/18/2020   HGB 10.7 (L) 10/18/2020   PLT 319 10/18/2020   NA 141 11/26/2021   K 3.8 11/26/2021   CL 104 11/26/2021   CO2 18 (L) 11/26/2021   GLUCOSE 115 (H) 11/26/2021   BUN 23 11/26/2021   CREATININE 0.99 11/26/2021   BILITOT <0.2 11/26/2021   ALKPHOS 150 (H) 11/26/2021   AST 21 11/26/2021   ALT 24 11/26/2021   PROT 7.0 11/26/2021   ALBUMIN 4.6 11/26/2021   CALCIUM 9.2 11/26/2021   GFRAA 60 02/12/2020    Speciality Comments: No specialty comments available.  Procedures:  No procedures performed Allergies: Contrast media [iodinated contrast media], Prohance [gadoteridol], Ptu [propylthiouracil], Sulfa antibiotics, Tapazole [methimazole], and Latex   Assessment / Plan:     Visit Diagnoses: No diagnosis found.  ***  Orders: No orders of the defined types were placed in this encounter.  No orders of the defined types were placed in this encounter.    Follow-Up Instructions: No follow-ups on file.   Collier Salina, MD  Note - This record has been created using [employer].  Chart creation errors have been sought, but may not always  have been located. Such creation errors do not reflect on  the standard of medical care.

## 2022-03-23 ENCOUNTER — Ambulatory Visit: Payer: Medicare Other | Admitting: Internal Medicine

## 2022-03-24 ENCOUNTER — Ambulatory Visit: Payer: Medicare Other

## 2022-03-25 ENCOUNTER — Telehealth (HOSPITAL_COMMUNITY): Payer: Self-pay | Admitting: *Deleted

## 2022-03-25 ENCOUNTER — Encounter: Payer: Self-pay | Admitting: Student

## 2022-03-25 NOTE — Telephone Encounter (Signed)
Request from pharmacy for her buspirone. She has an appt on 3/29 and should not be out of her buspar till 04/14/22. Can discuss with her provider at her next appt.

## 2022-04-01 NOTE — Progress Notes (Deleted)
    SUBJECTIVE:   CHIEF COMPLAINT / HPI:   Diploplia and Dysphagia Seen by neurology 1/31, extensive workup including:  - Labs: AChR ab, MuSK ab, CK, Lactic Acid, CBC with diff, CMP, Vitamin B1, B6, B12, HbA1c - EMG/NCS/NMUS: RNS + NCS right sural sensory and right fibular motor - Images: CT Chest without contrast (external). Barium swallow study   Lab workup largely unremarkable, though I am unable to  see the EMG results and I do not believet he .   Barium swallow did show penetration of thin liquids without aspiration> being followed up with an esophagram that has not yet been done.   Seen by ophthalmology on 3/15 with concern for myesthenia gravis despite previously negative AChR ab and MuSK ab.  Consideration of repeating MRI brain. This has been done twice in the past: 2012 and 2019.  PERTINENT  PMH / PSH: ***  OBJECTIVE:   There were no vitals taken for this visit.  ***  ASSESSMENT/PLAN:   No problem-specific Assessment & Plan notes found for this encounter.     Marnee Guarneri, MD [place]

## 2022-04-02 ENCOUNTER — Ambulatory Visit: Payer: Medicare Other | Admitting: Student

## 2022-04-09 ENCOUNTER — Encounter (HOSPITAL_COMMUNITY): Payer: Self-pay | Admitting: Student in an Organized Health Care Education/Training Program

## 2022-04-09 ENCOUNTER — Telehealth (INDEPENDENT_AMBULATORY_CARE_PROVIDER_SITE_OTHER): Payer: Medicare Other | Admitting: Student in an Organized Health Care Education/Training Program

## 2022-04-09 DIAGNOSIS — F411 Generalized anxiety disorder: Secondary | ICD-10-CM | POA: Diagnosis not present

## 2022-04-09 DIAGNOSIS — F909 Attention-deficit hyperactivity disorder, unspecified type: Secondary | ICD-10-CM | POA: Diagnosis not present

## 2022-04-09 DIAGNOSIS — F3160 Bipolar disorder, current episode mixed, unspecified: Secondary | ICD-10-CM | POA: Diagnosis not present

## 2022-04-09 MED ORDER — PAROXETINE HCL 30 MG PO TABS: 30.0000 mg | ORAL_TABLET | Freq: Every day | ORAL | 1 refills | Status: DC

## 2022-04-09 MED ORDER — BUSPIRONE HCL 10 MG PO TABS: 10.0000 mg | ORAL_TABLET | Freq: Three times a day (TID) | ORAL | 1 refills | Status: DC

## 2022-04-09 MED ORDER — QUETIAPINE FUMARATE 400 MG PO TABS: 400.0000 mg | ORAL_TABLET | Freq: Every day | ORAL | 1 refills | Status: DC

## 2022-04-09 MED ORDER — AMPHETAMINE-DEXTROAMPHETAMINE 10 MG PO TABS: 10.0000 mg | ORAL_TABLET | Freq: Two times a day (BID) | ORAL | 0 refills | Status: DC

## 2022-04-09 NOTE — Progress Notes (Cosign Needed Addendum)
[place] MD/PA/NP OP Progress Note  Virtual Visit via Video Note  I connected with Jodi Gilmore on 04/09/22 at  3:00 PM EDT by a video enabled telemedicine application and verified that I am speaking with the correct person using two identifiers.  Location: Patient: Home Provider: [facility]   I discussed the limitations of evaluation and management by telemedicine and the availability of in person appointments. The patient expressed understanding and agreed to proceed.   04/09/2022 3:30 PM Lat[place] Wertheim  MRN:  [id number]  Chief Complaint:  Chief Complaint  Patient presents with   Follow-up   Bipolar Depressed   Anxiety   HPI:  Jodi Gilmore is a 67 yr old female who presents via Virtual Video Visit for Follow Up and Medication Management.  PPHx is significant for Bipolar Disorder,  GAD, and ADHD.   She reports that she continues to have significant issues with her physical health which is impacting her mental health.  She reports that the specialists she has been seeing have ruled out some things but will not order an MRI which is a hindrance for her.  Encouraged her to discuss this with her PCP and she reports she will.  Discussed her ADHD and Adderall use.  She reports that she has been trying to go through her medical records from both [place] and [place] and that her ADHD medicine allows her to do that.  She reports that she is less anxious because she is not afraid of making mistakes because she is able to focus and that she is less impulsive.  Encouraged her to take medication holidays as possible but that this was appropriate use of her medication.  Discussed increasing her Paxil due to her continued depression and anxiety.  Discussed we would not make any other changes at this time and she was agreeable with this.  He reports he would also like to restart therapy again.  She reports no SI, HI, or AVH.  She reports her sleep is poor.  She reports her appetite is fair.  She  reports she continues to have dizziness and weakness as well as headaches with her vision issues but otherwise reports no other concerns at present.  She will return for follow-up approximately 6 weeks.  Visit Diagnosis:    ICD-10-CM   1. Bipolar affective disorder, current episode mixed, current episode severity unspecified ([place])  F31.60     2. Generalized anxiety disorder  F41.1     3. Attention deficit hyperactivity disorder (ADHD), unspecified ADHD type  F90.9       Past Psychiatric History: Bipolar Disorder,  GAD, and ADHD.   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    Hip pain    HLA B27 (HLA B27 positive)    Hyperlipidemia    Hypertension    Knee pain  Memory changes    Morphea scleroderma    Neuromuscular disorder ([place])    Raynauds    Pneumonia    Psoriatic arthritis ([place])    seen in ears only   Raynaud's disease    Sjogren syndrome with dental involvement ([place])     Past Surgical History:  Procedure Laterality Date   CYST REMOVAL HAND     left arm posterior   laproscopy     SALPINGECTOMY  1980   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   Procedure: LEFT TOTAL HIP ARTHROPLASTY ANTERIOR APPROACH;  Surgeon: Mcarthur Rossetti, MD;  Location: [facility];  Service: Orthopedics;  Laterality: Left;   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      Family Psychiatric History: Nephew - Schizophrenia Father - patient suspects that her father may be bipolar   Family History:  Family History  Problem  Relation Age of Onset   Heart disease Mother    Congestive Heart Failure Mother    Dementia Mother    Heart disease Father    Heart attack Father    Dementia Sister    Hemachromatosis Sister    Parkinson's disease Sister    Allergies Son    Healthy Son    Colon cancer Neg Hx    Colon polyps Neg Hx    Esophageal cancer Neg Hx    Stomach cancer Neg Hx    Rectal cancer Neg Hx     Social History:  Social History   Socioeconomic History   Marital status: Single    Spouse name: Not on file   Number of children: Not on file   Years of education: Not on file   Highest education level: Not on file  Occupational History   Occupation: Unemployed  Tobacco Use   Smoking status: Former   Smokeless tobacco: Never  Scientific laboratory technician Use: Never used  Substance and Sexual Activity   Alcohol use: Not Currently   Drug use: Never   Sexual activity: Not on file  Other Topics Concern   Not on file  Social History Narrative   Not on file   Social Determinants of Health   Financial Resource Strain: Medium Risk (03/09/2022)   Overall Financial Resource Strain (CARDIA)    Difficulty of Paying Living Expenses: Somewhat hard  Food Insecurity: No Food Insecurity (03/09/2022)   Hunger Vital Sign    Worried About Running Out of Food in the Last Year: Never true    Ran Out of Food in the Last Year: Never true  Transportation Needs: Unmet Transportation Needs (03/10/2022)   PRAPARE - Transportation    Lack of Transportation (Medical): Yes    Lack of Transportation (Non-Medical): Yes  Physical Activity: Inactive (03/09/2022)   Exercise Vital Sign    Days of Exercise per Week: 0 days    Minutes of Exercise per Session: 0 min  Stress: Stress Concern Present (03/09/2022)   [place]    Feeling of Stress : To some extent  Social Connections: Socially Isolated (03/09/2022)   Social Connection and Isolation Panel [NHANES]     Frequency of Communication with Friends and Family: Once a week    Frequency of Social Gatherings with Friends and Family: Never    Attends Religious Services: Never    Marine scientist or Organizations: No    Attends Archivist Meetings: Never    Marital Status: Never married    Allergies:  Allergies  Allergen  Reactions   Contrast Media [Iodinated Contrast Media] Anaphylaxis   Prohance [Gadoteridol] Anaphylaxis   Ptu [Propylthiouracil]    Sulfa Antibiotics     Unknown reaction, family history   Tapazole [Methimazole]    Latex Hives and Rash    Metabolic Disorder Labs: Lab Results  Component Value Date   HGBA1C 5.4 11/26/2021   No results found for: "PROLACTIN" Lab Results  Component Value Date   CHOL 272 (H) 02/12/2020   TRIG 145 02/12/2020   HDL 71 02/12/2020   CHOLHDL 3.8 02/12/2020   LDLCALC 175 (H) 02/12/2020   Lab Results  Component Value Date   TSH 7.500 (H) 11/26/2021   TSH CANCELED 11/26/2021    Therapeutic Level Labs: No results found for: "LITHIUM" No results found for: "VALPROATE" No results found for: "CBMZ"  Current Medications: Current Outpatient Medications  Medication Sig Dispense Refill   acetaminophen (TYLENOL) 325 MG tablet Take 650 mg by mouth every 6 (six) hours as needed for moderate pain.     amphetamine-dextroamphetamine (ADDERALL) 10 MG tablet Take 1 tablet (10 mg total) by mouth 2 (two) times daily with a meal. 60 tablet 0   aspirin-acetaminophen-caffeine (EXCEDRIN MIGRAINE) 250-250-65 MG tablet Take 2 tablets by mouth every 6 (six) hours as needed for headache.     atorvastatin (LIPITOR) 40 MG tablet TAKE ONE TABLET BY MOUTH ONE TIME DAILY 90 tablet 0   B Complex Vitamins (VITAMIN B COMPLEX PO) Take 1 tablet by mouth daily.     Bacillus Coagulans-Inulin (PROBIOTIC-PREBIOTIC PO) Take 1 capsule by mouth daily.     busPIRone (BUSPAR) 10 MG tablet Take 1 tablet (10 mg total) by mouth 3 (three) times daily. 90 tablet 1    cetirizine (ZYRTEC ALLERGY) 10 MG tablet Take 1 tablet (10 mg total) by mouth daily. 90 tablet 1   Cholecalciferol (VITAMIN D) 125 MCG (5000 UT) CAPS Take 10,000 Units by mouth daily.     diclofenac Sodium (VOLTAREN) 1 % GEL Apply 4 g topically 4 (four) times daily. 150 g 4   FEVERFEW PO Take 1 tablet by mouth 3 (three) times daily.     fluticasone (FLONASE) 50 MCG/ACT nasal spray USE TWO SPRAYS IN EACH NOSTRIL ONE TIME DAILY 16 mL 6   levothyroxine (SYNTHROID) 112 MCG tablet Take 1 tablet (112 mcg total) by mouth every morning. 30 minutes before food 90 tablet 3   lisinopril (ZESTRIL) 2.5 MG tablet TAKE ONE TABLET BY MOUTH AT BEDTIME 90 tablet 1   Magnesium Oxide (MAG-OX 400 PO) Take 400 mg by mouth in the morning and at bedtime.     Melatonin 1 MG SUBL      methocarbamol (ROBAXIN) 500 MG tablet TAKE ONE TABLET BY MOUTH EVERY 6 HOURS AS NEEDED FOR MUSCLE SPASM 40 tablet 1   Multiple Vitamin (MULTIVITAMIN) tablet Take 1 tablet by mouth daily.     Omega-3 Fatty Acids (OMEGA-3 EPA FISH OIL PO) Take 3 capsules by mouth daily.     omeprazole (PRILOSEC) 20 MG capsule Take 1 capsule (20 mg total) by mouth daily. (Patient taking differently: Take 20 mg by mouth 2 (two) times daily before a meal.) 90 capsule 0   ondansetron (ZOFRAN) 4 MG tablet Take 1 tablet (4 mg total) by mouth every 8 (eight) hours as needed for nausea or vomiting. 20 tablet 0   OVER THE COUNTER MEDICATION Take 4 capsules by mouth at bedtime. Calm Forte     PARoxetine (PAXIL) 20 MG tablet Take 1 tablet (20  mg total) by mouth daily. 30 tablet 1   Potassium Bicarbonate 99 MG CAPS Take 99 mg by mouth 3 (three) times daily.     QUEtiapine (SEROQUEL) 400 MG tablet Take 1 tablet (400 mg total) by mouth at bedtime. 30 tablet 1   RESTASIS 0.05 % ophthalmic emulsion INSTILL ONE DROP INTO EACH EYE TWICE DAILY 60 each 3   rizatriptan (MAXALT) 10 MG tablet TAKE ONE TABLET BY MOUTH AT THE ONSET OF HEADACHE, MAY REPEAT WITH ONE TABLET IN TWO HOURS  IF NEEDED, DO NOT EXCEED 2 TABLETS IN 24 HOURS 30 tablet 0   TURMERIC PO Take 1 capsule by mouth in the morning, at noon, and at bedtime.     valACYclovir (VALTREX) 500 MG tablet TAKE ONE TABLET BY MOUTH TWICE A DAY FOR 7 DAYS 30 tablet 2   No current facility-administered medications for this visit.     Musculoskeletal: Strength & Muscle Tone: within normal limits Gait & Station:  laying down during interview Patient leans: N/A  Psychiatric Specialty Exam: Review of Systems  Eyes:  Positive for visual disturbance.  Respiratory:  Negative for shortness of breath.   Cardiovascular:  Negative for chest pain.  Gastrointestinal:  Negative for abdominal pain, constipation, diarrhea, nausea and vomiting.  Neurological:  Positive for dizziness, weakness and headaches.  Psychiatric/Behavioral:  Positive for dysphoric mood and sleep disturbance. Negative for hallucinations and suicidal ideas. The patient is nervous/anxious.     There were no vitals taken for this visit.There is no height or weight on file to calculate BMI.  General Appearance: Casual and Fairly Groomed  Eye Contact:  Fair  Speech:  Clear and Coherent and Normal Rate  Volume:  Normal  Mood:  Anxious and Dysphoric  Affect:  Congruent  Thought Process:  Coherent and Goal Directed  Orientation:  Full (Time, Place, and Person)  Thought Content: WDL and Logical   Suicidal Thoughts:  No  Homicidal Thoughts:  No  Memory:  Immediate;   Good Recent;   Good  Judgement:  Fair  Insight:  Fair  Psychomotor Activity:  Normal  Concentration:  Concentration: Fair and Attention Span: Fair  Recall:  Good  Fund of Knowledge: Good  Language: Good  Akathisia:  Negative  Handed:  Right  AIMS (if indicated): not done  Assets:  Communication Skills Desire for Improvement Housing Resilience  ADL's:  Impaired due to physical issues not psychiatric   Cognition: WNL  Sleep:  Poor   Screenings: GAD-7    Flowsheet Row Video Visit  from 08/26/2021 in [facility] Video Visit from 06/24/2021 in [facility] Video Visit from 04/22/2021 in [facility] Video Visit from 03/20/2021 in [facility] Video Visit from 02/19/2021 in [facility]  Total GAD-7 Score 9 10 14 13 11       PHQ2-9    [place] from 03/09/2022 in [place] Office Visit from 11/26/2021 in [place] Video Visit from 08/26/2021 in [facility] Video Visit from 06/24/2021 in [facility] Video Visit from 04/22/2021 in [facility]  PHQ-2 Total Score 2 4 6 3 6   PHQ-9 Total Score 15 13 18 16 20       Flowsheet Row Video Visit from 08/26/2021 in [facility] Video Visit from 06/24/2021 in [facility] Counselor from  05/05/2021 in [facility]  C-SSRS RISK CATEGORY Low Risk Low Risk Error: Question 6 not populated        Assessment and Plan:  Faren "[name]" Baldy is a 67 yr old female who presents via Virtual Video Visit for Follow Up and Medication Management.  PPHx is significant for Bipolar Disorder,  GAD, and ADHD.    Patty continues to have significant issues with her physical health that are worsening her mental issues.  Due to her continued issues with depression and anxiety we will increase her Paxil.  We will not make any changes to her medication at this time.  Will assist in getting her back on to her therapist's schedule.  She will return follow-up in approximately 6 weeks.   Bipolar Disorder, Current Episode Mixed: -Continue Seroquel 400 mg QHS.  30 tablets with 1 refill.     GAD: -Increase Paxil to 30 mg daily.  30 tablets with 1 -Continue Buspar 10 mg TID.  90 tablets with 1  refills.     ADHD: -Continue Adderall 10 mg BID.  30 tablets with 0 refills to be filled 04/16/2022.      Collaboration of Care:   Patient/Guardian was advised Release of Information must be obtained prior to any record release in order to collaborate their care with an outside provider. Patient/Guardian was advised if they have not already done so to contact the registration department to sign all necessary forms in order for [place] to release information regarding their care.   Consent: Patient/Guardian gives verbal consent for treatment and assignment of benefits for services provided during this visit. Patient/Guardian expressed understanding and agreed to proceed.    Briant Cedar, MD 04/09/2022, 3:30 PM  Follow Up Instructions:    I discussed the assessment and treatment plan with the patient. The patient was provided an opportunity to ask questions and all were answered. The patient agreed with the plan and demonstrated an understanding of the instructions.   The patient was advised to call back or seek an in-person evaluation if the symptoms worsen or if the condition fails to improve as anticipated.  I provided 32 minutes of non-face-to-face time during this encounter.   Briant Cedar, MD

## 2022-04-12 NOTE — Progress Notes (Deleted)
Office Visit Note  Patient: Jodi Gilmore             Date of Birth: 11-23-1955           MRN: [id number]             PCP: Eppie Gibson, MD Referring: Eppie Gibson, MD Visit Date: 04/13/2022   Subjective:  No chief complaint on file.   History of Present Illness: Jodi Gilmore is a 66 y.o. female here for follow up ***   Previous HPI 09/01/20 Jodi Gilmore is a 67 y.o. female here for evaluation with history of multiple rheumatic conditions apparently RA, sjogren's syndrome, scleroderma, and raynaud's and with history of hypothyroidism. She has a somewhat complicated history with numerous symptoms both focal symptoms including joint pain and swelling, eye and mouth dryness, skin lesions reportedly both psoriasis and behcets pathology previously seen. At the moment skin disease is not very active. Joint pain is worst in the left hip with severe degenerative arthritis already seen at orthopedic surgery clinic. She also has osteoarthritis of other sites with worsening changes of fingers on bilateral hands. However she does have multiple generalized symptoms and has had labile thyroid disease with difficult to control  levothyroxine dose changes. Chronic dry eyes and mouth with what sounds like possible abrasion but no past positive biopsy specific for sjogren syndrome. Previous autoimmune serology has been somewhat variable, with mixed positive markers and sometimes negative ANA. CRP has been persistently elevated. She reports occasional oral ulcers usually on sides. She denies lymphadenopathy, focal alopecia, photosensitive skin rashes or residual hyperpigmentation, or history of blood clots.   Previous patient of Dr. Annamaria Boots with [facility] problems treated reviewed including ADD, hypothyroidism, chronic back pain, fatigue, depression, and vitamin D deficiency.   Labs reviewed 05/2020 TSH 13.4 fT4 1.07   02/2020 ANA neg RNA Poly III neg Scl-70 neg CCP 84 hsCRP  7.98 HLA-B27 neg BMP eGFR 52   08/2018 hsCRP >10.0 TSH 20.28     Imaging reviewed 06/19/20 MRI Lumbar spine IMPRESSION: Convex right scoliosis. Right greater than left subarticular recess narrowing at L1-2 due to bilateral disc protrusions. Mild left subarticular recess and moderate left foraminal narrowing at L2-3. Left subarticular recess and foraminal protrusion at L3-4 could impact the descending left L4 root and causes mild to moderate left foraminal narrowing. Moderately severe left and mild-to-moderate right foraminal narrowing at L4-5 where there is moderate central canal stenosis. Mild to moderate right foraminal narrowing L5-S1. The central canal and left foramen are open.   05/08/20 Xray left hip Severe arthritic changes of the left hip.   05/08/20 Xray left knee Minimal arthritic changes of the left knee. No joint effusion. The soft tissues are unremarkable.   No Rheumatology ROS completed.   PMFS History:  Patient Active Problem List   Diagnosis Date Noted   Diplopia 11/26/2021   Overuse of acetaminophen 11/26/2021   Status post left hip replacement 10/17/2020   Unilateral primary osteoarthritis, left hip 10/16/2020   Chronic pain of left knee 10/08/2020   Bilateral hand pain 09/01/2020   Lower back injury 09/01/2020   Idiopathic scoliosis of lumbar spine 09/01/2020   Bipolar affective disorder, current episode mixed 08/29/2020   Primary osteoarthritis of left hip 07/30/2020   Essential (primary) hypertension 07/17/2020   HSV (herpes simplex virus) anogenital infection 06/26/2020   Generalized anxiety disorder 06/04/2020   Attention deficit hyperactivity disorder (ADHD) 06/04/2020   Migraine headache with aura 05/17/2020   Sinusitis  chronic, frontal 05/17/2020   HLD (hyperlipidemia) 05/17/2020   Elevated C-reactive protein (CRP) 02/13/2020   Other specified arthritis, left knee 02/13/2020   Hip arthritis 02/13/2020   Bipolar disorder 02/13/2020    Hypothyroidism (acquired) 01/03/2020   Diarrhea in adult patient 01/03/2020   Brain fog 01/03/2020   Healthcare maintenance 01/03/2020    Past Medical History:  Diagnosis Date   Allergy    Anxiety    situational    Asthma    yeras ago - not current    Behcet's disease ([place])    Bell's palsy    Cataract    both removed    Depression    situational -    Diplopia    Elevated blood pressure reading    no BP meds currently    Family history of adverse reaction to anesthesia    GERD (gastroesophageal reflux disease)    Graves disease 1991   Heart murmur    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    Hip pain    HLA B27 (HLA B27 positive)    Hyperlipidemia    Hypertension    Knee pain    Memory changes    Morphea scleroderma    Neuromuscular disorder (H[facility])    Raynauds    Pneumonia    Psoriatic arthritis ([facility])    seen in ears only   Raynaud's disease    Sjogren syndrome with dental involvement ([place])     Family History  Problem Relation Age of Onset   Heart disease Mother    Congestive Heart Failure Mother    Dementia Mother    Heart disease Father    Heart attack Father    Dementia Sister    Hemachromatosis Sister    Parkinson's disease Sister    Allergies Son    Healthy Son    Colon cancer Neg Hx    Colon polyps Neg Hx    Esophageal cancer Neg Hx    Stomach cancer Neg Hx    Rectal cancer Neg Hx    Past Surgical History:  Procedure Laterality Date   CYST REMOVAL HAND     left arm posterior   laproscopy     SALPINGECTOMY  1980   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   Procedure: LEFT TOTAL HIP ARTHROPLASTY ANTERIOR APPROACH;  Surgeon: Mcarthur Rossetti, MD;  Location: [facility];  Service: Orthopedics;  Laterality: Left;   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     Social History   Social History Narrative   Not on file   Immunization History  Administered Date(s) Administered   Fluad Quad(high Dose 65+) 11/26/2021   Moderna  Sars-Covid-2 Vaccination 11/29/2019, 12/25/2019   PFIZER Comirnaty(Gray Top)Covid-19 Tri-Sucrose Vaccine 06/24/2020   PNEUMOCOCCAL CONJUGATE-20 11/26/2021     Objective: Vital Signs: There were no vitals taken for this visit.   Physical Exam   Musculoskeletal Exam: ***  CDAI Exam: CDAI Score: -- Patient Global: --; Provider Global: -- Swollen: --; Tender: -- Joint Exam 04/13/2022   No joint exam has been documented for this visit   There is currently no information documented on the homunculus. Go to the Rheumatology activity and complete the homunculus joint exam.  Investigation: No additional findings.  Imaging: CT CHEST WO CONTRAST  Result Date: 03/16/2022 CLINICAL DATA:  Chest pain, cough, difficulty breathing EXAM: CT CHEST WITHOUT CONTRAST TECHNIQUE: Multidetector CT imaging of the chest was performed following the standard protocol without IV contrast. RADIATION DOSE  REDUCTION: This exam was performed according to the departmental dose-optimization program which includes automated exposure control, adjustment of the mA and/or kV according to patient size and/or use of iterative reconstruction technique. COMPARISON:  Chest radiographs done on 01/03/2020 FINDINGS: Cardiovascular: There are scattered coronary artery calcifications. Mediastinum/Nodes: Small hiatal hernia is seen. Small amount of fluid is seen in the lumen of the thoracic esophagus, possibly suggesting gastroesophageal reflux. No significant lymphadenopathy seen. Thyroid is difficult visualize. This may suggest atrophy. Lungs/Pleura: There is no focal pulmonary consolidation. In image 26 of series 8, there is 3 mm pleural-based nodule in posterior right upper lobe. In image 27, there is a 3 mm nodule in the posterior left upper lobe. There is no pleural effusion or pneumothorax. Upper Abdomen: No acute findings are seen. Musculoskeletal: Unremarkable. IMPRESSION: There are few nodules each measuring less than 3 mm in  size in both upper lobes. No follow-up needed if patient is low-risk (and has no known or suspected primary neoplasm). Non-contrast chest CT can be considered in 12 months if patient is high-risk. This recommendation follows the consensus statement: Guidelines for Management of Incidental Pulmonary Nodules Detected on CT Images: From the Fleischner Society 2017; Radiology 2017; 284:228-243. There is no focal pulmonary consolidation. No significant lymphadenopathy is seen. Coronary artery calcifications are seen. Small hiatal hernia with gastroesophageal reflux. Electronically Signed   By: Elmer Picker M.D.   On: 03/16/2022 15:58    Recent Labs: Lab Results  Component Value Date   WBC 16.8 (H) 10/18/2020   HGB 10.7 (L) 10/18/2020   PLT 319 10/18/2020   NA 141 11/26/2021   K 3.8 11/26/2021   CL 104 11/26/2021   CO2 18 (L) 11/26/2021   GLUCOSE 115 (H) 11/26/2021   BUN 23 11/26/2021   CREATININE 0.99 11/26/2021   BILITOT <0.2 11/26/2021   ALKPHOS 150 (H) 11/26/2021   AST 21 11/26/2021   ALT 24 11/26/2021   PROT 7.0 11/26/2021   ALBUMIN 4.6 11/26/2021   CALCIUM 9.2 11/26/2021   GFRAA 60 02/12/2020    Speciality Comments: No specialty comments available.  Procedures:  No procedures performed Allergies: Contrast media [iodinated contrast media], Prohance [gadoteridol], Ptu [propylthiouracil], Sulfa antibiotics, Tapazole [methimazole], and Latex   Assessment / Plan:     Visit Diagnoses: No diagnosis found.  ***  Orders: No orders of the defined types were placed in this encounter.  No orders of the defined types were placed in this encounter.    Follow-Up Instructions: No follow-ups on file.   Collier Salina, MD  Note - This record has been created using [employer].  Chart creation errors have been sought, but may not always  have been located. Such creation errors do not reflect on  the standard of medical care.

## 2022-04-13 ENCOUNTER — Ambulatory Visit: Payer: Medicare Other | Admitting: Internal Medicine

## 2022-04-21 ENCOUNTER — Other Ambulatory Visit (INDEPENDENT_AMBULATORY_CARE_PROVIDER_SITE_OTHER): Payer: Medicare Other

## 2022-04-21 ENCOUNTER — Ambulatory Visit (INDEPENDENT_AMBULATORY_CARE_PROVIDER_SITE_OTHER): Payer: Medicare Other | Admitting: Orthopaedic Surgery

## 2022-04-21 DIAGNOSIS — G8929 Other chronic pain: Secondary | ICD-10-CM

## 2022-04-21 DIAGNOSIS — M5441 Lumbago with sciatica, right side: Secondary | ICD-10-CM

## 2022-04-21 DIAGNOSIS — Z96642 Presence of left artificial hip joint: Secondary | ICD-10-CM

## 2022-04-21 DIAGNOSIS — M5442 Lumbago with sciatica, left side: Secondary | ICD-10-CM

## 2022-04-21 MED ORDER — METHOCARBAMOL 500 MG PO TABS: ORAL_TABLET | ORAL | 1 refills | Status: DC

## 2022-04-21 NOTE — Progress Notes (Signed)
The patient comes in today with severe low back pain that radiates down her left side.  I actually replaced her left hip in October 2022.  She has seen a spine specialist before who wanted to have her hip addressed first.  Her back pain is getting worse.  She has had previous MRI of her lumbar spine showing areas of severe stenosis.  Her left operative hip moves smoothly and fluidly.  An AP and lateral of her pelvis and left hip shows normal-appearing hip replacement.  Her right hip also has normal joint space.  2 views of the lumbar spine show severe arthritic changes at the mid lumbar spine.  At this point the issues are now related to her lumbar spine and low back pain.  I can send in some methocarbamol for but have encouraged her to follow-up with neurosurgery again who saw her before.  She agrees with this as well.

## 2022-04-22 ENCOUNTER — Other Ambulatory Visit: Payer: Self-pay | Admitting: Student

## 2022-04-23 ENCOUNTER — Encounter: Payer: Self-pay | Admitting: Internal Medicine

## 2022-04-23 ENCOUNTER — Encounter: Payer: Self-pay | Admitting: Student

## 2022-04-23 ENCOUNTER — Other Ambulatory Visit: Payer: Self-pay | Admitting: Student

## 2022-04-27 ENCOUNTER — Encounter: Payer: Self-pay | Admitting: Student

## 2022-04-27 DIAGNOSIS — H5000 Unspecified esotropia: Secondary | ICD-10-CM | POA: Insufficient documentation

## 2022-04-27 DIAGNOSIS — R1310 Dysphagia, unspecified: Secondary | ICD-10-CM | POA: Insufficient documentation

## 2022-04-27 DIAGNOSIS — R1312 Dysphagia, oropharyngeal phase: Secondary | ICD-10-CM | POA: Insufficient documentation

## 2022-04-28 ENCOUNTER — Ambulatory Visit: Payer: Medicare Other | Admitting: Internal Medicine

## 2022-04-28 NOTE — Progress Notes (Deleted)
Office Visit Note  Patient: Jodi Gilmore             Date of Birth: Feb 23, 1955           MRN: [id number]             PCP: Alicia Amel, MD Referring: Alicia Amel, MD Visit Date: 04/28/2022   Subjective:  No chief complaint on file.   History of Present Illness: Jodi Gilmore is a 67 y.o. female here for follow up ***   Previous HPI 09/01/20 Sima Lindenberger is a 67 y.o. female here for evaluation with history of multiple rheumatic conditions apparently RA, sjogren's syndrome, scleroderma, and raynaud's and with history of hypothyroidism. She has a somewhat complicated history with numerous symptoms both focal symptoms including joint pain and swelling, eye and mouth dryness, skin lesions reportedly both psoriasis and behcets pathology previously seen. At the moment skin disease is not very active. Joint pain is worst in the left hip with severe degenerative arthritis already seen at orthopedic surgery clinic. She also has osteoarthritis of other sites with worsening changes of fingers on bilateral hands. However she does have multiple generalized symptoms and has had labile thyroid disease with difficult to control  levothyroxine dose changes. Chronic dry eyes and mouth with what sounds like possible abrasion but no past positive biopsy specific for sjogren syndrome. Previous autoimmune serology has been somewhat variable, with mixed positive markers and sometimes negative ANA. CRP has been persistently elevated. She reports occasional oral ulcers usually on sides. She denies lymphadenopathy, focal alopecia, photosensitive skin rashes or residual hyperpigmentation, or history of blood clots.   Previous patient of Dr. Maple Hudson with [facility] problems treated reviewed including ADD, hypothyroidism, chronic back pain, fatigue, depression, and vitamin D deficiency.   Labs reviewed 05/2020 TSH 13.4 fT4 1.07   02/2020 ANA neg RNA Poly III neg Scl-70 neg CCP 84 hsCRP  7.98 HLA-B27 neg BMP eGFR 52   08/2018 hsCRP >10.0 TSH 20.28     Imaging reviewed 06/19/20 MRI Lumbar spine IMPRESSION: Convex right scoliosis. Right greater than left subarticular recess narrowing at L1-2 due to bilateral disc protrusions. Mild left subarticular recess and moderate left foraminal narrowing at L2-3. Left subarticular recess and foraminal protrusion at L3-4 could impact the descending left L4 root and causes mild to moderate left foraminal narrowing. Moderately severe left and mild-to-moderate right foraminal narrowing at L4-5 where there is moderate central canal stenosis. Mild to moderate right foraminal narrowing L5-S1. The central canal and left foramen are open.   05/08/20 Xray left hip Severe arthritic changes of the left hip.   05/08/20 Xray left knee Minimal arthritic changes of the left knee. No joint effusion. The soft tissues are unremarkable.   No Rheumatology ROS completed.   PMFS History:  Patient Active Problem List   Diagnosis Date Noted   Esotropia 04/27/2022   Oropharyngeal dysphagia 04/27/2022   Diplopia 11/26/2021   Overuse of acetaminophen 11/26/2021   Status post left hip replacement 10/17/2020   Unilateral primary osteoarthritis, left hip 10/16/2020   Chronic pain of left knee 10/08/2020   Bilateral hand pain 09/01/2020   Lower back injury 09/01/2020   Idiopathic scoliosis of lumbar spine 09/01/2020   Bipolar affective disorder, current episode mixed 08/29/2020   Primary osteoarthritis of left hip 07/30/2020   Essential (primary) hypertension 07/17/2020   HSV (herpes simplex virus) anogenital infection 06/26/2020   Generalized anxiety disorder 06/04/2020   Attention deficit hyperactivity disorder (ADHD) 06/04/2020  Migraine headache with aura 05/17/2020   Sinusitis chronic, frontal 05/17/2020   HLD (hyperlipidemia) 05/17/2020   Elevated C-reactive protein (CRP) 02/13/2020   Other specified arthritis, left knee 02/13/2020   Hip  arthritis 02/13/2020   Bipolar disorder 02/13/2020   Hypothyroidism (acquired) 01/03/2020   Diarrhea in adult patient 01/03/2020   Brain fog 01/03/2020   Healthcare maintenance 01/03/2020    Past Medical History:  Diagnosis Date   Allergy    Anxiety    situational    Asthma    yeras ago - not current    Behcet's disease (HCC)    Bell's palsy    Cataract    both removed    Depression    situational -    Diplopia    Elevated blood pressure reading    no BP meds currently    Family history of adverse reaction to anesthesia    GERD (gastroesophageal reflux disease)    Graves disease 1991   Heart murmur    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    Hip pain    HLA B27 (HLA B27 positive)    Hyperlipidemia    Hypertension    Knee pain    Memory changes    Morphea scleroderma    Neuromuscular disorder (HCC)    Raynauds    Pneumonia    Psoriatic arthritis (HCC)    seen in ears only   Raynaud's disease    Sjogren syndrome with dental involvement (HCC)     Family History  Problem Relation Age of Onset   Heart disease Mother    Congestive Heart Failure Mother    Dementia Mother    Heart disease Father    Heart attack Father    Dementia Sister    Hemachromatosis Sister    Parkinson's disease Sister    Allergies Son    Healthy Son    Colon cancer Neg Hx    Colon polyps Neg Hx    Esophageal cancer Neg Hx    Stomach cancer Neg Hx    Rectal cancer Neg Hx    Past Surgical History:  Procedure Laterality Date   CYST REMOVAL HAND     left arm posterior   laproscopy     SALPINGECTOMY  1980   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   Procedure: LEFT TOTAL HIP ARTHROPLASTY ANTERIOR APPROACH;  Surgeon: Kathryne Hitch, MD;  Location: [facility];  Service: Orthopedics;  Laterality: Left;   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     Social History   Social History Narrative   Not on file   Immunization History  Administered Date(s)  Administered   Fluad Quad(high Dose 65+) 11/26/2021   Moderna Sars-Covid-2 Vaccination 11/29/2019, 12/25/2019   PFIZER Comirnaty(Gray Top)Covid-19 Tri-Sucrose Vaccine 06/24/2020   PNEUMOCOCCAL CONJUGATE-20 11/26/2021     Objective: Vital Signs: There were no vitals taken for this visit.   Physical Exam   Musculoskeletal Exam: ***  CDAI Exam: CDAI Score: -- Patient Global: --; Provider Global: -- Swollen: --; Tender: -- Joint Exam 04/28/2022   No joint exam has been documented for this visit   There is currently no information documented on the homunculus. Go to the Rheumatology activity and complete the homunculus joint exam.  Investigation: No additional findings.  Imaging: XR Lumbar Spine 2-3 Views  Result Date: 04/21/2022 2 views of the lumbar spine show severe degenerative changes of the mid lumbar spine.  XR HIP UNILAT W OR W/O  PELVIS 1V LEFT  Result Date: 04/21/2022 An AP pelvis and lateral left hip shows normal-appearing hip replacement with no complicating features with the left side.  The right hip has normal joint space.   Recent Labs: Lab Results  Component Value Date   WBC 16.8 (H) 10/18/2020   HGB 10.7 (L) 10/18/2020   PLT 319 10/18/2020   NA 141 11/26/2021   K 3.8 11/26/2021   CL 104 11/26/2021   CO2 18 (L) 11/26/2021   GLUCOSE 115 (H) 11/26/2021   BUN 23 11/26/2021   CREATININE 0.99 11/26/2021   BILITOT <0.2 11/26/2021   ALKPHOS 150 (H) 11/26/2021   AST 21 11/26/2021   ALT 24 11/26/2021   PROT 7.0 11/26/2021   ALBUMIN 4.6 11/26/2021   CALCIUM 9.2 11/26/2021   GFRAA 60 02/12/2020    Speciality Comments: No specialty comments available.  Procedures:  No procedures performed Allergies: Contrast media [iodinated contrast media], Prohance [gadoteridol], Ptu [propylthiouracil], Sulfa antibiotics, Tapazole [methimazole], and Latex   Assessment / Plan:     Visit Diagnoses: No diagnosis found.  ***  Orders: No orders of the defined types  were placed in this encounter.  No orders of the defined types were placed in this encounter.    Follow-Up Instructions: No follow-ups on file.   Fuller Plan, MD  Note - This record has been created using [employer].  Chart creation errors have been sought, but may not always  have been located. Such creation errors do not reflect on  the standard of medical care.

## 2022-05-04 ENCOUNTER — Encounter: Payer: Self-pay | Admitting: Orthopaedic Surgery

## 2022-05-04 ENCOUNTER — Encounter: Payer: Self-pay | Admitting: Student

## 2022-05-04 ENCOUNTER — Other Ambulatory Visit: Payer: Self-pay

## 2022-05-04 DIAGNOSIS — R110 Nausea: Secondary | ICD-10-CM

## 2022-05-04 MED ORDER — ONDANSETRON HCL 4 MG PO TABS: 4.0000 mg | ORAL_TABLET | Freq: Three times a day (TID) | ORAL | 0 refills | Status: DC | PRN

## 2022-05-12 ENCOUNTER — Ambulatory Visit: Payer: Medicare Other | Admitting: Internal Medicine

## 2022-05-12 NOTE — Progress Notes (Deleted)
Office Visit Note  Patient: Jodi Gilmore             Date of Birth: 12-03-1955           MRN: [id number]             PCP: Alicia Amel, MD Referring: Alicia Amel, MD Visit Date: 05/12/2022   Subjective:  No chief complaint on file.   History of Present Illness: Jodi Gilmore is a 67 y.o. female here for follow up ***   Previous HPI 09/01/20 Jodi Gilmore is a 67 y.o. female here for evaluation with history of multiple rheumatic conditions apparently RA, sjogren's syndrome, scleroderma, and raynaud's and with history of hypothyroidism. She has a somewhat complicated history with numerous symptoms both focal symptoms including joint pain and swelling, eye and mouth dryness, skin lesions reportedly both psoriasis and behcets pathology previously seen. At the moment skin disease is not very active. Joint pain is worst in the left hip with severe degenerative arthritis already seen at orthopedic surgery clinic. She also has osteoarthritis of other sites with worsening changes of fingers on bilateral hands. However she does have multiple generalized symptoms and has had labile thyroid disease with difficult to control  levothyroxine dose changes. Chronic dry eyes and mouth with what sounds like possible abrasion but no past positive biopsy specific for sjogren syndrome. Previous autoimmune serology has been somewhat variable, with mixed positive markers and sometimes negative ANA. CRP has been persistently elevated. She reports occasional oral ulcers usually on sides. She denies lymphadenopathy, focal alopecia, photosensitive skin rashes or residual hyperpigmentation, or history of blood clots.   Previous patient of Dr. Maple Hudson with [facility] problems treated reviewed including ADD, hypothyroidism, chronic back pain, fatigue, depression, and vitamin D deficiency.   Labs reviewed 05/2020 TSH 13.4 fT4 1.07   02/2020 ANA neg RNA Poly III neg Scl-70 neg CCP 84 hsCRP  7.98 HLA-B27 neg BMP eGFR 52   08/2018 hsCRP >10.0 TSH 20.28     Imaging reviewed 06/19/20 MRI Lumbar spine IMPRESSION: Convex right scoliosis. Right greater than left subarticular recess narrowing at L1-2 due to bilateral disc protrusions. Mild left subarticular recess and moderate left foraminal narrowing at L2-3. Left subarticular recess and foraminal protrusion at L3-4 could impact the descending left L4 root and causes mild to moderate left foraminal narrowing. Moderately severe left and mild-to-moderate right foraminal narrowing at L4-5 where there is moderate central canal stenosis. Mild to moderate right foraminal narrowing L5-S1. The central canal and left foramen are open.   05/08/20 Xray left hip Severe arthritic changes of the left hip.   05/08/20 Xray left knee Minimal arthritic changes of the left knee. No joint effusion. The soft tissues are unremarkable.   No Rheumatology ROS completed.   PMFS History:  Patient Active Problem List   Diagnosis Date Noted   Esotropia 04/27/2022   Oropharyngeal dysphagia 04/27/2022   Diplopia 11/26/2021   Overuse of acetaminophen 11/26/2021   Status post left hip replacement 10/17/2020   Unilateral primary osteoarthritis, left hip 10/16/2020   Chronic pain of left knee 10/08/2020   Bilateral hand pain 09/01/2020   Lower back injury 09/01/2020   Idiopathic scoliosis of lumbar spine 09/01/2020   Bipolar affective disorder, current episode mixed (HCC) 08/29/2020   Primary osteoarthritis of left hip 07/30/2020   Essential (primary) hypertension 07/17/2020   HSV (herpes simplex virus) anogenital infection 06/26/2020   Generalized anxiety disorder 06/04/2020   Attention deficit hyperactivity disorder (ADHD) 06/04/2020  Migraine headache with aura 05/17/2020   Sinusitis chronic, frontal 05/17/2020   HLD (hyperlipidemia) 05/17/2020   Elevated C-reactive protein (CRP) 02/13/2020   Other specified arthritis, left knee 02/13/2020    Hip arthritis 02/13/2020   Bipolar disorder (HCC) 02/13/2020   Hypothyroidism (acquired) 01/03/2020   Diarrhea in adult patient 01/03/2020   Brain fog 01/03/2020   Healthcare maintenance 01/03/2020    Past Medical History:  Diagnosis Date   Allergy    Anxiety    situational    Asthma    yeras ago - not current    Behcet's disease (HCC)    Bell's palsy    Cataract    both removed    Depression    situational -    Diplopia    Elevated blood pressure reading    no BP meds currently    Family history of adverse reaction to anesthesia    GERD (gastroesophageal reflux disease)    Graves disease 1991   Heart murmur    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    Hip pain    HLA B27 (HLA B27 positive)    Hyperlipidemia    Hypertension    Knee pain    Memory changes    Morphea scleroderma    Neuromuscular disorder (HCC)    Raynauds    Pneumonia    Psoriatic arthritis (HCC)    seen in ears only   Raynaud's disease    Sjogren syndrome with dental involvement (HCC)     Family History  Problem Relation Age of Onset   Heart disease Mother    Congestive Heart Failure Mother    Dementia Mother    Heart disease Father    Heart attack Father    Dementia Sister    Hemachromatosis Sister    Parkinson's disease Sister    Allergies Son    Healthy Son    Colon cancer Neg Hx    Colon polyps Neg Hx    Esophageal cancer Neg Hx    Stomach cancer Neg Hx    Rectal cancer Neg Hx    Past Surgical History:  Procedure Laterality Date   CYST REMOVAL HAND     left arm posterior   laproscopy     SALPINGECTOMY  1980   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   Procedure: LEFT TOTAL HIP ARTHROPLASTY ANTERIOR APPROACH;  Surgeon: Kathryne Hitch, MD;  Location: [facility];  Service: Orthopedics;  Laterality: Left;   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     Social History   Social History Narrative   Not on file   Immunization History  Administered Date(s)  Administered   Fluad Quad(high Dose 65+) 11/26/2021   Moderna Sars-Covid-2 Vaccination 11/29/2019, 12/25/2019   PFIZER Comirnaty(Gray Top)Covid-19 Tri-Sucrose Vaccine 06/24/2020   PNEUMOCOCCAL CONJUGATE-20 11/26/2021     Objective: Vital Signs: There were no vitals taken for this visit.   Physical Exam   Musculoskeletal Exam: ***  CDAI Exam: CDAI Score: -- Patient Global: --; Provider Global: -- Swollen: --; Tender: -- Joint Exam 05/12/2022   No joint exam has been documented for this visit   There is currently no information documented on the homunculus. Go to the Rheumatology activity and complete the homunculus joint exam.  Investigation: No additional findings.  Imaging: XR Lumbar Spine 2-3 Views  Result Date: 04/21/2022 2 views of the lumbar spine show severe degenerative changes of the mid lumbar spine.  XR HIP UNILAT W OR  W/O PELVIS 1V LEFT  Result Date: 04/21/2022 An AP pelvis and lateral left hip shows normal-appearing hip replacement with no complicating features with the left side.  The right hip has normal joint space.   Recent Labs: Lab Results  Component Value Date   WBC 16.8 (H) 10/18/2020   HGB 10.7 (L) 10/18/2020   PLT 319 10/18/2020   NA 141 11/26/2021   K 3.8 11/26/2021   CL 104 11/26/2021   CO2 18 (L) 11/26/2021   GLUCOSE 115 (H) 11/26/2021   BUN 23 11/26/2021   CREATININE 0.99 11/26/2021   BILITOT <0.2 11/26/2021   ALKPHOS 150 (H) 11/26/2021   AST 21 11/26/2021   ALT 24 11/26/2021   PROT 7.0 11/26/2021   ALBUMIN 4.6 11/26/2021   CALCIUM 9.2 11/26/2021   GFRAA 60 02/12/2020    Speciality Comments: No specialty comments available.  Procedures:  No procedures performed Allergies: Contrast media [iodinated contrast media], Prohance [gadoteridol], Ptu [propylthiouracil], Sulfa antibiotics, Tapazole [methimazole], and Latex   Assessment / Plan:     Visit Diagnoses: No diagnosis found.  ***  Orders: No orders of the defined types  were placed in this encounter.  No orders of the defined types were placed in this encounter.    Follow-Up Instructions: No follow-ups on file.   Fuller Plan, MD  Note - This record has been created using [employer].  Chart creation errors have been sought, but may not always  have been located. Such creation errors do not reflect on  the standard of medical care.

## 2022-05-14 ENCOUNTER — Other Ambulatory Visit: Payer: Self-pay | Admitting: *Deleted

## 2022-05-16 MED ORDER — OMEPRAZOLE 20 MG PO CPDR: 20.0000 mg | DELAYED_RELEASE_CAPSULE | Freq: Every day | ORAL | 0 refills | Status: DC

## 2022-05-18 ENCOUNTER — Telehealth (HOSPITAL_COMMUNITY): Payer: Self-pay

## 2022-05-18 ENCOUNTER — Telehealth (HOSPITAL_COMMUNITY): Payer: Self-pay | Admitting: *Deleted

## 2022-05-18 DIAGNOSIS — F909 Attention-deficit hyperactivity disorder, unspecified type: Secondary | ICD-10-CM

## 2022-05-18 NOTE — Telephone Encounter (Signed)
Message left on nurse line seeking a rx for her Adderall States she doesn't drive and it takes her awhile to make arrangements to pick her medicine up and she stated she has been calling since last week. She has an appt with the provider on the 9th , she should be out and I will forward this request to Dr Renaldo Fiddler.

## 2022-05-19 MED ORDER — AMPHETAMINE-DEXTROAMPHETAMINE 10 MG PO TABS
10.0000 mg | ORAL_TABLET | Freq: Two times a day (BID) | ORAL | 0 refills | Status: DC
Start: 2022-05-19 — End: 2022-05-21

## 2022-05-19 NOTE — Telephone Encounter (Signed)
Received message patient needed a refill or her Adderall.  Per PDMP last fill was 4/10 so refill was sent.    Sent: -Adderall 10 mg BID.  60 tablets with 0 refills.     Arna Snipe MD Resident

## 2022-05-19 NOTE — Telephone Encounter (Signed)
Refill already addressed- see telephone message 5/7 for order sent.     Arna Snipe MD Resident\

## 2022-05-20 ENCOUNTER — Ambulatory Visit (INDEPENDENT_AMBULATORY_CARE_PROVIDER_SITE_OTHER): Payer: Medicare Other | Admitting: Clinical

## 2022-05-20 ENCOUNTER — Telehealth: Payer: Self-pay

## 2022-05-20 ENCOUNTER — Encounter (HOSPITAL_COMMUNITY): Payer: Self-pay

## 2022-05-20 DIAGNOSIS — F411 Generalized anxiety disorder: Secondary | ICD-10-CM | POA: Diagnosis not present

## 2022-05-20 NOTE — Telephone Encounter (Signed)
Patient contacted the office and states she has old Rheumatology records for Dr. Dimple Casey. Patient states she is not mobile but can bring them in at her appointment on 06/01/2022.

## 2022-05-20 NOTE — Progress Notes (Signed)
THERAPIST PROGRESS NOTE Virtual Visit via Video Note  I connected with Jodi Gilmore on 05/20/2022 at  8:00 AM EDT by a video enabled telemedicine application and verified that I am speaking with the correct person using two identifiers.  Location: Patient: home Provider: office   I discussed the limitations of evaluation and management by telemedicine and the availability of in person appointments. The patient expressed understanding and agreed to proceed.   Follow Up Instructions: I discussed the assessment and treatment plan with the patient. The patient was provided an opportunity to ask questions and all were answered. The patient agreed with the plan and demonstrated an understanding of the instructions.   The patient was advised to call back or seek an in-person evaluation if the symptoms worsen or if the condition fails to improve as anticipated.    Session Time: 30 minutes  Participation Level: Active  Behavioral Response: CasualAlertEuthymic  Type of Therapy: Individual Therapy  Treatment Goals addressed: Client will practice problem solving skills 3 times per week for the next 4 weeks  ProgressTowards Goals: Progressing  Interventions: CBT and Supportive  Summary:  Jodi Gilmore is a 67 y.o. female who presents for the scheduled appointment oriented x 5, appropriately dressed,.  Client denied hallucinations and delusions. Client reported on today she has been doing fairly okay.  Client reported she anticipates needing back surgery sometime this year.  Client reported upon going to the specialist they have discovered that she has noticed and her bones are rubbing together.  Client reported she is not able to walk, stand, or sit up straight for long periods of time due to extreme discomfort.  Client reported she is also not sleeping.  Client reported she has bouts of experiencing dry mouth.  Client reported on today she would not be able to talk that long because her  mouth is dry and she has sores in her mouth.  Client reported the dryness wakes her up at night because she does not have the most likely to swallow.  Client reported she also has some issues with her vision and will have to going for additional testing for that as well.  Client reported it has been very tedious process but it has been made easier as she is paying for caregiver services.  Client reported it has helped her to keep appointments.  Client reported her son and daughter-in-law will have their official wedding in October this year and she wants to be well enough to ambulate. Evidence of progress towards goal:  client reported 1 positive that she has additional resources to help her go to her doctors appointment.  Suicidal/Homicidal: Nowithout intent/plan  Therapist Response:  Therapist began the appointment asking the client how she has been doing since last seen. Therapist used CBT to engage using active listening and positive emotional support. Therapist used CBT to engage and ask the client about changes to her health and how she is coping with it. Therapist used CBT to normalize the clients emotional response. Therapist used CBT to positively reinforce her initiative to find supportive resources. Therapist used CBT ask the client to identify her progress with frequency of use with coping skills with continued practice in her daily activity.    Therapist assigned the client homework to practice self care.     Plan: Return again in 4 weeks.  Diagnosis: generalized anxiety disorder  Collaboration of Care: Patient refused AEB none requested by the client.  Patient/Guardian was advised Release of Information must be  obtained prior to any record release in order to collaborate their care with an outside provider. Patient/Guardian was advised if they have not already done so to contact the registration department to sign all necessary forms in order for [place] to release information  regarding their care.   Consent: Patient/Guardian gives verbal consent for treatment and assignment of benefits for services provided during this visit. Patient/Guardian expressed understanding and agreed to proceed.   Neena Rhymes Ileigh Mettler, LCSW 05/20/2022

## 2022-05-21 ENCOUNTER — Telehealth (INDEPENDENT_AMBULATORY_CARE_PROVIDER_SITE_OTHER): Payer: Medicare Other | Admitting: Student in an Organized Health Care Education/Training Program

## 2022-05-21 DIAGNOSIS — F3160 Bipolar disorder, current episode mixed, unspecified: Secondary | ICD-10-CM | POA: Diagnosis not present

## 2022-05-21 DIAGNOSIS — F909 Attention-deficit hyperactivity disorder, unspecified type: Secondary | ICD-10-CM | POA: Diagnosis not present

## 2022-05-21 DIAGNOSIS — F411 Generalized anxiety disorder: Secondary | ICD-10-CM

## 2022-05-21 MED ORDER — AMPHETAMINE-DEXTROAMPHETAMINE 10 MG PO TABS
10.0000 mg | ORAL_TABLET | Freq: Two times a day (BID) | ORAL | 0 refills | Status: DC
Start: 2022-06-17 — End: 2022-08-08

## 2022-05-21 MED ORDER — BUSPIRONE HCL 10 MG PO TABS: 10.0000 mg | ORAL_TABLET | Freq: Three times a day (TID) | ORAL | 1 refills | Status: DC

## 2022-05-21 MED ORDER — QUETIAPINE FUMARATE 400 MG PO TABS
400.0000 mg | ORAL_TABLET | Freq: Every day | ORAL | 1 refills | Status: DC
Start: 2022-05-21 — End: 2022-08-06

## 2022-05-21 MED ORDER — AMPHETAMINE-DEXTROAMPHETAMINE 10 MG PO TABS
10.0000 mg | ORAL_TABLET | Freq: Two times a day (BID) | ORAL | 0 refills | Status: DC
Start: 2022-07-15 — End: 2022-08-08

## 2022-05-21 MED ORDER — PAROXETINE HCL 30 MG PO TABS: 30.0000 mg | ORAL_TABLET | Freq: Every day | ORAL | 1 refills | Status: DC

## 2022-05-21 NOTE — Progress Notes (Signed)
BH MD/PA/NP OP Progress Note  Virtual Visit via Video Note  I connected with [name] on 05/22/22 at  2:00 PM EDT by a video enabled telemedicine application and verified that I am speaking with the correct person using two identifiers.  Location: Patient: Home Provider: [facility]   I discussed the limitations of evaluation and management by telemedicine and the availability of in person appointments. The patient expressed understanding and agreed to proceed.   05/22/2022 6:51 AM [name]  MRN:  [id number]  Chief Complaint:  Chief Complaint  Patient presents with   Follow-up   Bipolar, Depressed   Anxiety   HPI:  Jodi Gilmore is a 67 yr old female who presents via Virtual Video Visit for Follow Up and Medication Management.  PPHx is significant for Bipolar Disorder,  GAD, and ADHD.   She reports that she is doing okay.  She reports her mood has mostly remained stable with some irritability still being present.  She reports she has continued to have several stressors from her physical health but that she is at least making some progress.  She reports she does have an MRI scheduled for the next week and that is not a question of if she will have spine surgery but just how much and so this will be happening in the next few months.  She reports that her nephew has told her her sister appears to be showing signs of dementia and so that is now 3 direct relatives that have had dementia.  She reports she continues to have significant issues with chronic pain, dizziness, weakness, dry mouth, and vision problems.  She reports that reading through some of her old medical records and it is the same constellation of symptoms that this has been going on for quite a while.  She asked if a change in her Seroquel might be appropriate but discussed with her that given her issues with walking and dizziness already further increases could be dangerous and she reported understanding.  Discussed  with her that we would not make any medication changes at this time and send in refills and she was agreeable with this.  She asked if any lab work needs to be done because she is seeing her rheumatologist in approximately 2 weeks.  Discussed with her that she should get an updated EKG done and she requests that we contact her PCP as she will get done.  She reports no SI, HI, or AVH.  She reports her sleep is poor.  She reports her appetite is fair.  She reports several physical issues which are chronic but otherwise reports no new concerns at present.  She will return for follow-up in approximately 2 months.  Discussed with patient that Resident Provider would be transitioning their care to another Resident Provider, Dr. Jerrel Ivory, starting July 2024.  She reported understanding and had no concerns.     Visit Diagnosis:    ICD-10-CM   1. Bipolar affective disorder, current episode mixed, current episode severity unspecified (HCC)  F31.60 QUEtiapine (SEROQUEL) 400 MG tablet    PARoxetine (PAXIL) 30 MG tablet    2. Attention deficit hyperactivity disorder (ADHD), unspecified ADHD type  F90.9 amphetamine-dextroamphetamine (ADDERALL) 10 MG tablet    amphetamine-dextroamphetamine (ADDERALL) 10 MG tablet    3. Generalized anxiety disorder  F41.1 busPIRone (BUSPAR) 10 MG tablet    PARoxetine (PAXIL) 30 MG tablet      Past Psychiatric History: Bipolar Disorder,  GAD, and ADHD.   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 determined by flow  cytometry    Hip pain    HLA B27 (HLA B27 positive)    Hyperlipidemia    Hypertension    Knee pain    Memory changes    Morphea scleroderma    Neuromuscular disorder (HCC)    Raynauds    Pneumonia    Psoriatic arthritis (HCC)    seen in ears only   Raynaud's disease    Sjogren syndrome with dental involvement (HCC)     Past Surgical History:  Procedure Laterality Date   CYST REMOVAL HAND     left arm posterior   laproscopy     SALPINGECTOMY  1980   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   Procedure: LEFT TOTAL HIP ARTHROPLASTY ANTERIOR APPROACH;  Surgeon: Kathryne Hitch, MD;  Location: [facility];  Service: Orthopedics;  Laterality: Left;   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      Family Psychiatric History: Nephew - Schizophrenia Father - patient suspects that her father may be bipolar   Family History:  Family History  Problem Relation Age of Onset   Heart disease Mother    Congestive Heart Failure Mother    Dementia Mother    Heart disease Father    Heart attack Father    Dementia Sister    Hemachromatosis Sister    Parkinson's disease Sister    Allergies Son    Healthy Son    Colon cancer Neg Hx    Colon polyps Neg Hx    Esophageal cancer Neg Hx    Stomach cancer Neg Hx    Rectal cancer Neg Hx     Social History:  Social History   Socioeconomic History   Marital status: Single    Spouse name: Not on file   Number of children: Not on file   Years of education: Not on file   Highest education level: Not on file  Occupational History   Occupation: Unemployed  Tobacco Use   Smoking status: Former   Smokeless tobacco: Never  Building services engineer Use: Never used  Substance and Sexual Activity   Alcohol use: Not Currently   Drug use: Never   Sexual activity: Not on file  Other Topics Concern   Not on file  Social History Narrative   Not on file   Social Determinants of Health   Financial Resource Strain: Medium Risk (03/09/2022)   Overall Financial  Resource Strain (CARDIA)    Difficulty of Paying Living Expenses: Somewhat hard  Food Insecurity: No Food Insecurity (03/09/2022)   Hunger Vital Sign    Worried About Running Out of Food in the Last Year: Never true    Ran Out of Food in the Last Year: Never true  Transportation Needs: Unmet Transportation Needs (03/10/2022)   PRAPARE - Transportation  Lack of Transportation (Medical): Yes    Lack of Transportation (Non-Medical): Yes  Physical Activity: Inactive (03/09/2022)   Exercise Vital Sign    Days of Exercise per Week: 0 days    Minutes of Exercise per Session: 0 min  Stress: Stress Concern Present (03/09/2022)   [employer] of Occupational Health - Occupational Stress Questionnaire    Feeling of Stress : To some extent  Social Connections: Socially Isolated (03/09/2022)   Social Connection and Isolation Panel [NHANES]    Frequency of Communication with Friends and Family: Once a week    Frequency of Social Gatherings with Friends and Family: Never    Attends Religious Services: Never    Database administrator or Organizations: No    Attends Banker Meetings: Never    Marital Status: Never married    Allergies:  Allergies  Allergen Reactions   Contrast Media [Iodinated Contrast Media] Anaphylaxis   Prohance [Gadoteridol] Anaphylaxis   Ptu [Propylthiouracil]    Sulfa Antibiotics     Unknown reaction, family history   Tapazole [Methimazole]    Latex Hives and Rash    Metabolic Disorder Labs: Lab Results  Component Value Date   HGBA1C 5.4 11/26/2021   No results found for: "PROLACTIN" Lab Results  Component Value Date   CHOL 272 (H) 02/12/2020   TRIG 145 02/12/2020   HDL 71 02/12/2020   CHOLHDL 3.8 02/12/2020   LDLCALC 175 (H) 02/12/2020   Lab Results  Component Value Date   TSH 7.500 (H) 11/26/2021   TSH CANCELED 11/26/2021    Therapeutic Level Labs: No results found for: "LITHIUM" No results found for: "VALPROATE" No results found  for: "CBMZ"  Current Medications: Current Outpatient Medications  Medication Sig Dispense Refill   [START ON 07/15/2022] amphetamine-dextroamphetamine (ADDERALL) 10 MG tablet Take 1 tablet (10 mg total) by mouth 2 (two) times daily with a meal. 60 tablet 0   acetaminophen (TYLENOL) 325 MG tablet Take 650 mg by mouth every 6 (six) hours as needed for moderate pain.     [START ON 06/17/2022] amphetamine-dextroamphetamine (ADDERALL) 10 MG tablet Take 1 tablet (10 mg total) by mouth 2 (two) times daily with a meal. 60 tablet 0   aspirin-acetaminophen-caffeine (EXCEDRIN MIGRAINE) 250-250-65 MG tablet Take 2 tablets by mouth every 6 (six) hours as needed for headache.     atorvastatin (LIPITOR) 40 MG tablet TAKE ONE TABLET BY MOUTH ONE TIME DAILY 90 tablet 0   B Complex Vitamins (VITAMIN B COMPLEX PO) Take 1 tablet by mouth daily.     Bacillus Coagulans-Inulin (PROBIOTIC-PREBIOTIC PO) Take 1 capsule by mouth daily.     busPIRone (BUSPAR) 10 MG tablet Take 1 tablet (10 mg total) by mouth 3 (three) times daily. 90 tablet 1   cetirizine (ZYRTEC ALLERGY) 10 MG tablet Take 1 tablet (10 mg total) by mouth daily. 90 tablet 1   Cholecalciferol (VITAMIN D) 125 MCG (5000 UT) CAPS Take 10,000 Units by mouth daily.     diclofenac Sodium (VOLTAREN) 1 % GEL Apply 4 g topically 4 (four) times daily. 150 g 4   FEVERFEW PO Take 1 tablet by mouth 3 (three) times daily.     fluticasone (FLONASE) 50 MCG/ACT nasal spray USE TWO SPRAYS IN EACH NOSTRIL ONE TIME DAILY 16 mL 6   levothyroxine (SYNTHROID) 112 MCG tablet Take 1 tablet (112 mcg total) by mouth every morning. 30 minutes before food 90 tablet 3   lisinopril (ZESTRIL) 2.5 MG tablet TAKE ONE TABLET  BY MOUTH AT BEDTIME 90 tablet 1   Magnesium Oxide (MAG-OX 400 PO) Take 400 mg by mouth in the morning and at bedtime.     Melatonin 1 MG SUBL      methocarbamol (ROBAXIN) 500 MG tablet TAKE ONE TABLET BY MOUTH EVERY 6 HOURS AS NEEDED FOR MUSCLE SPASM 40 tablet 1   Multiple  Vitamin (MULTIVITAMIN) tablet Take 1 tablet by mouth daily.     Omega-3 Fatty Acids (OMEGA-3 EPA FISH OIL PO) Take 3 capsules by mouth daily.     omeprazole (PRILOSEC) 20 MG capsule Take 1 capsule (20 mg total) by mouth daily. 90 capsule 0   ondansetron (ZOFRAN) 4 MG tablet Take 1 tablet (4 mg total) by mouth every 8 (eight) hours as needed for nausea or vomiting. 20 tablet 0   OVER THE COUNTER MEDICATION Take 4 capsules by mouth at bedtime. Calm Forte     PARoxetine (PAXIL) 30 MG tablet Take 1 tablet (30 mg total) by mouth daily. 30 tablet 1   Potassium Bicarbonate 99 MG CAPS Take 99 mg by mouth 3 (three) times daily.     QUEtiapine (SEROQUEL) 400 MG tablet Take 1 tablet (400 mg total) by mouth at bedtime. 30 tablet 1   RESTASIS 0.05 % ophthalmic emulsion INSTILL ONE DROP INTO EACH EYE TWICE DAILY 60 each 3   rizatriptan (MAXALT) 10 MG tablet TAKE ONE TABLET BY MOUTH AT ONSET OF HEADACHE. MAY REPEAT DOSE WITH ONE TABLET IN TWO HOURS IF NEEDED. DO NOT EXCEED TWO TABLETS IN 24 HOURS. 30 tablet 0   TURMERIC PO Take 1 capsule by mouth in the morning, at noon, and at bedtime.     valACYclovir (VALTREX) 500 MG tablet TAKE ONE TABLET BY MOUTH TWICE A DAY FOR 7 DAYS 30 tablet 2   No current facility-administered medications for this visit.     Musculoskeletal: Strength & Muscle Tone: within normal limits Gait & Station: normal Patient leans: N/A  Psychiatric Specialty Exam: Review of Systems  Eyes:  Positive for visual disturbance.  Respiratory:  Negative for shortness of breath.   Cardiovascular:  Negative for chest pain.  Gastrointestinal:  Negative for abdominal pain, constipation, diarrhea, nausea and vomiting.  Neurological:  Positive for dizziness, weakness and headaches.  Psychiatric/Behavioral:  Positive for dysphoric mood and sleep disturbance. Negative for hallucinations and suicidal ideas. The patient is not nervous/anxious.     There were no vitals taken for this visit.There is no  height or weight on file to calculate BMI.  General Appearance: Casual  Eye Contact:  Fair  Speech:  Clear and Coherent and Normal Rate  Volume:  Normal  Mood:  Dysphoric  Affect:  Congruent  Thought Process:  Coherent and Goal Directed  Orientation:  Full (Time, Place, and Person)  Thought Content: WDL and Logical   Suicidal Thoughts:  No  Homicidal Thoughts:  No  Memory:  Immediate;   Good Recent;   Good  Judgement:  Fair  Insight:  Fair  Psychomotor Activity:  Normal  Concentration:  Concentration: Good and Attention Span: Good  Recall:  Good  Fund of Knowledge: Good  Language: Good  Akathisia:  Negative  Handed:  Right  AIMS (if indicated): not done  Assets:  Communication Skills Desire for Improvement Housing Resilience  ADL's:  Impaired due to physical issues not psychiatric   Cognition: WNL  Sleep:  Poor   Screenings: GAD-7    Flowsheet Row Video Visit from 08/26/2021 in [facility] Video Visit from 06/24/2021 in [facility] Video Visit from 04/22/2021 in [facility] Video Visit from 03/20/2021 in [facility] Video Visit from 02/19/2021 in [facility]  Total GAD-7 Score 9 10 14 13 11       PHQ2-9    Flowsheet Row Clinical Support from 03/09/2022 in [facility] Office Visit from 11/26/2021 in [facility] Video Visit from 08/26/2021 in [facility] Video Visit from 06/24/2021 in [facility] Video Visit from 04/22/2021 in [facility]  PHQ-2 Total Score 2 4 6 3 6   PHQ-9 Total Score 15 13 18 16 20       Flowsheet Row Video Visit from 08/26/2021 in [facility] Video Visit from 06/24/2021 in [facility] Counselor from 05/05/2021 in [facility]  C-SSRS RISK CATEGORY Low Risk Low Risk Error: Question 6 not populated        Assessment and Plan:  Cherryle "[name]" Beougher is a 67 yr old female who presents via Virtual Video Visit for Follow Up and Medication Management.  PPHx is significant for Bipolar Disorder,  GAD, and ADHD.    [name] has remained stable from a psychiatric standpoint, however, she does continue to have significant medical issues which is impacting her psychiatric health.  She will be getting an MRI and most likely will be getting spine surgery in the near future so further increases in her Paxil or BuSpar may be needed but at this time she is stable.  We will not make any changes to her medications at this time.  Refills were sent in.  We will reach out to her PCP to request an EKG be done at her next appointment.  She will return for follow-up in approximately 2 months.   Bipolar Disorder, Current Episode Mixed: -Continue Seroquel 400 mg QHS.  30 tablets with 1 refill.     GAD: -Continue Paxil 30 mg daily.  30 tablets with 1 refill. -Continue Buspar 10 mg TID.  90 tablets with 1 refill.     ADHD: -Continue Adderall 10 mg BID.  30 tablets with 1 refill, first fill 6/6.    Collaboration of Care:   Patient/Guardian was advised Release of Information must be obtained prior to any record release in order to collaborate their care with an outside provider. Patient/Guardian was advised if they have not already done so to contact the registration department to sign all necessary forms in order for [place] to release information regarding their care.   Consent: Patient/Guardian gives verbal consent for treatment and assignment of benefits for services provided during this visit. Patient/Guardian expressed understanding and agreed to proceed.    Lauro Franklin, MD 05/22/2022, 6:51 AM   Follow Up Instructions:    I discussed the assessment and treatment plan with the patient. The patient  was provided an opportunity to ask questions and all were answered. The patient agreed with the plan and demonstrated an understanding of the instructions.   The patient was advised to call back or seek an in-person evaluation if the symptoms worsen or if the condition fails to improve as anticipated.  I provided 25 minutes of non-face-to-face time during this encounter.   Lauro Franklin, MD

## 2022-05-22 ENCOUNTER — Encounter (HOSPITAL_COMMUNITY): Payer: Self-pay | Admitting: Student in an Organized Health Care Education/Training Program

## 2022-06-01 ENCOUNTER — Encounter: Payer: Self-pay | Admitting: Internal Medicine

## 2022-06-01 ENCOUNTER — Ambulatory Visit: Payer: Medicare Other | Attending: Internal Medicine | Admitting: Internal Medicine

## 2022-06-01 VITALS — BP 97/65 | HR 116 | Resp 14 | Ht 65.0 in | Wt 193.0 lb

## 2022-06-01 DIAGNOSIS — R768 Other specified abnormal immunological findings in serum: Secondary | ICD-10-CM | POA: Diagnosis present

## 2022-06-01 DIAGNOSIS — H5000 Unspecified esotropia: Secondary | ICD-10-CM

## 2022-06-01 DIAGNOSIS — R5383 Other fatigue: Secondary | ICD-10-CM | POA: Diagnosis present

## 2022-06-01 DIAGNOSIS — E039 Hypothyroidism, unspecified: Secondary | ICD-10-CM

## 2022-06-01 DIAGNOSIS — R7681 Abnormal rheumatoid factor and anti-citrullinated protein antibody without rheumatoid arthritis: Secondary | ICD-10-CM

## 2022-06-01 DIAGNOSIS — R7982 Elevated C-reactive protein (CRP): Secondary | ICD-10-CM

## 2022-06-01 DIAGNOSIS — M797 Fibromyalgia: Secondary | ICD-10-CM

## 2022-06-01 LAB — CBC WITH DIFFERENTIAL/PLATELET
Basophils Relative: 0.6 %
Eosinophils Absolute: 245 cells/uL (ref 15–500)
HCT: 36.8 % (ref 35.0–45.0)
Lymphs Abs: 1264 cells/uL (ref 850–3900)
MCV: 82.3 fL (ref 80.0–100.0)
Neutrophils Relative %: 78.3 %

## 2022-06-01 NOTE — Progress Notes (Signed)
Office Visit Note  Patient: Jodi Gilmore             Date of Birth: 1955-02-23           MRN: [id number]             PCP: Alicia Amel, MD Referring: Alicia Amel, MD Visit Date: 06/01/2022   Subjective:  Follow-up (Patient states she has rashes. Patient states most of her pain is in her back and it hurts for her to sit, stand, and walk for more than 5 minutes. Patient states she gets a lot of headaches. Patient states she has pain that goes from her neck to her arms. )   History of Present Illness: Jodi Gilmore is a 67 y.o. female here for follow up for joint pain in multiple areas particularly worse in upper body involving everywhere from neck down into her low back and into both hands.  We previously saw her in 2022 due to history of multiple suspected rheumatic conditions with symptoms of joint pain and swelling, eye and mouth dryness, and skin lesions that have been concerning for possible psoriasis s or cutaneous vasculitis.  Later that year she went for right hip joint replacement with a good benefit in her pain and mobility in that area.  Joint pain issues have been ongoing though some specific problems like the swelling have been intermittent.  Worst pain is in her back and notices increased symptoms with any specific position or activity whether sitting standing or walking for more than 5 minutes continuously.  Some days also feels just widespread pain throughout her entire body but still usually worse in the upper than lower distribution.  Has very frequent headaches sometimes rating up the back of the head but sometimes bilateral in the front.  Occasionally gets double vision but has nearly normal acuity at other times.  She had evaluation with ophthalmology and neurology previously appear to be some disagreement about possibility of ocular myasthenia gravis symptoms but negative on evaluation with formal neurologic testing.  Not currently on any disease specific treatment for  this.  She went for nerve conduction study on February 27 this year that was apparently normal.   Previous HPI 09/01/20 Jodi Gilmore is a 67 y.o. female here for evaluation with history of multiple rheumatic conditions apparently RA, sjogren's syndrome, scleroderma, and raynaud's and with history of hypothyroidism. She has a somewhat complicated history with numerous symptoms both focal symptoms including joint pain and swelling, eye and mouth dryness, skin lesions reportedly both psoriasis and behcets pathology previously seen. At the moment skin disease is not very active. Joint pain is worst in the left hip with severe degenerative arthritis already seen at orthopedic surgery clinic. She also has osteoarthritis of other sites with worsening changes of fingers on bilateral hands. However she does have multiple generalized symptoms and has had labile thyroid disease with difficult to control  levothyroxine dose changes. Chronic dry eyes and mouth with what sounds like possible abrasion but no past positive biopsy specific for sjogren syndrome. Previous autoimmune serology has been somewhat variable, with mixed positive markers and sometimes negative ANA. CRP has been persistently elevated. She reports occasional oral ulcers usually on sides. She denies lymphadenopathy, focal alopecia, photosensitive skin rashes or residual hyperpigmentation, or history of blood clots.   Previous patient of Dr. Maple Hudson with [facility] problems treated reviewed including ADD, hypothyroidism, chronic back pain, fatigue, depression, and vitamin D deficiency.   Labs reviewed 05/2020 TSH  13.4 fT4 1.07   02/2020 ANA neg RNA Poly III neg Scl-70 neg CCP 84 hsCRP 7.98 HLA-B27 neg BMP eGFR 52   08/2018 hsCRP >10.0 TSH 20.28     Imaging reviewed 06/19/20 MRI Lumbar spine IMPRESSION: Convex right scoliosis. Right greater than left subarticular recess narrowing at L1-2 due to bilateral disc  protrusions. Mild left subarticular recess and moderate left foraminal narrowing at L2-3. Left subarticular recess and foraminal protrusion at L3-4 could impact the descending left L4 root and causes mild to moderate left foraminal narrowing. Moderately severe left and mild-to-moderate right foraminal narrowing at L4-5 where there is moderate central canal stenosis. Mild to moderate right foraminal narrowing L5-S1. The central canal and left foramen are open.   05/08/20 Xray left hip Severe arthritic changes of the left hip.   05/08/20 Xray left knee Minimal arthritic changes of the left knee. No joint effusion. The soft tissues are unremarkable.   Review of Systems  Constitutional:  Positive for fatigue.  HENT:  Positive for mouth sores and mouth dryness.   Eyes:  Positive for dryness.  Respiratory:  Positive for shortness of breath.   Cardiovascular:  Positive for chest pain. Negative for palpitations.  Gastrointestinal:  Positive for constipation and diarrhea. Negative for blood in stool.  Endocrine: Positive for increased urination.  Genitourinary:  Positive for involuntary urination.  Musculoskeletal:  Positive for joint pain, gait problem, joint pain, joint swelling, myalgias, muscle weakness, morning stiffness, muscle tenderness and myalgias.  Skin:  Positive for rash, hair loss and sensitivity to sunlight. Negative for color change.  Allergic/Immunologic: Negative for susceptible to infections.  Neurological:  Positive for dizziness and headaches.  Hematological:  Negative for swollen glands.  Psychiatric/Behavioral:  Positive for depressed mood and sleep disturbance. The patient is nervous/anxious.     PMFS History:  Patient Active Problem List   Diagnosis Date Noted   Fibromyalgia 06/01/2022   Other fatigue 06/01/2022   Esotropia 04/27/2022   Oropharyngeal dysphagia 04/27/2022   Diplopia 11/26/2021   Overuse of acetaminophen 11/26/2021   Status post left hip replacement  10/17/2020   Unilateral primary osteoarthritis, left hip 10/16/2020   Chronic pain of left knee 10/08/2020   Bilateral hand pain 09/01/2020   Lower back injury 09/01/2020   Idiopathic scoliosis of lumbar spine 09/01/2020   Bipolar affective disorder, current episode mixed (HCC) 08/29/2020   Primary osteoarthritis of left hip 07/30/2020   Essential (primary) hypertension 07/17/2020   HSV (herpes simplex virus) anogenital infection 06/26/2020   Generalized anxiety disorder 06/04/2020   Attention deficit hyperactivity disorder (ADHD) 06/04/2020   Migraine headache with aura 05/17/2020   Sinusitis chronic, frontal 05/17/2020   HLD (hyperlipidemia) 05/17/2020   Elevated C-reactive protein (CRP) 02/13/2020   Other specified arthritis, left knee 02/13/2020   Hip arthritis 02/13/2020   Bipolar disorder (HCC) 02/13/2020   Hypothyroidism (acquired) 01/03/2020   Diarrhea in adult patient 01/03/2020   Brain fog 01/03/2020   Healthcare maintenance 01/03/2020    Past Medical History:  Diagnosis Date   Allergy    Anxiety    situational    Asthma    yeras ago - not current    Behcet's disease (HCC)    Bell's palsy    Cataract    both removed    Depression    situational -    Diplopia    Elevated blood pressure reading    no BP meds currently    Esotropia    Family history of adverse reaction to  anesthesia    Fibromyalgia    GERD (gastroesophageal reflux disease)    Graves disease 1991   Heart murmur    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    Hip pain    HLA B27 (HLA B27 positive)    Hyperlipidemia    Hypertension    Knee pain    Memory changes    Morphea scleroderma    Neuromuscular disorder (HCC)    Raynauds    Pneumonia    Psoriatic arthritis (HCC)    seen in ears only   Raynaud's disease    Sjogren syndrome with dental involvement (HCC)     Family History  Problem Relation Age of Onset   Heart disease Mother    Congestive  Heart Failure Mother    Dementia Mother    Heart disease Father    Heart attack Father    Dementia Sister    Hemachromatosis Sister    Parkinson's disease Sister    Allergies Son    Healthy Son    Colon cancer Neg Hx    Colon polyps Neg Hx    Esophageal cancer Neg Hx    Stomach cancer Neg Hx    Rectal cancer Neg Hx    Past Surgical History:  Procedure Laterality Date   CYST REMOVAL HAND     left arm posterior   laproscopy     SALPINGECTOMY  1980   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   Procedure: LEFT TOTAL HIP ARTHROPLASTY ANTERIOR APPROACH;  Surgeon: Kathryne Hitch, MD;  Location: [facility];  Service: Orthopedics;  Laterality: Left;   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     Social History   Social History Narrative   Not on file   Immunization History  Administered Date(s) Administered   Fluad Quad(high Dose 65+) 11/26/2021   Moderna Sars-Covid-2 Vaccination 11/29/2019, 12/25/2019   PFIZER Comirnaty(Gray Top)Covid-19 Tri-Sucrose Vaccine 06/24/2020   PNEUMOCOCCAL CONJUGATE-20 11/26/2021     Objective: Vital Signs: BP 97/65 (BP Location: Left Arm, Patient Position: Sitting, Cuff Size: Normal)   Pulse (!) 116   Resp 14   Ht 5\' 5"  (1.651 m)   Wt 193 lb (87.5 kg)   BMI 32.12 kg/m    Physical Exam Eyes:     Conjunctiva/sclera: Conjunctivae normal.  Cardiovascular:     Rate and Rhythm: Regular rhythm. Tachycardia present.  Pulmonary:     Effort: Pulmonary effort is normal.     Breath sounds: Normal breath sounds.  Lymphadenopathy:     Cervical: No cervical adenopathy.  Skin:    General: Skin is warm and dry.  Neurological:     Mental Status: She is alert.  Psychiatric:        Mood and Affect: Mood normal.      Musculoskeletal Exam:  Shoulders full ROM no tenderness or swelling Elbows full ROM no tenderness or swelling Wrists full ROM no tenderness or swelling Fingers full ROM Heberden's nodes and DIP joints of both hands, squaring of first CMC joint, no  palpable synovitis Tenderness to pressure midline and over paraspinal muscles throughout the back most severe at lumbar spine Knees full ROM no tenderness or swelling, bilateral patellofemoral crepitus   Investigation: No additional findings.  Imaging: No results found.  Recent Labs: Lab Results  Component Value Date   WBC 9.8 06/01/2022   HGB 12.0 06/01/2022   PLT 389 06/01/2022   NA 141 11/26/2021   K 3.8 11/26/2021   CL  104 11/26/2021   CO2 18 (L) 11/26/2021   GLUCOSE 115 (H) 11/26/2021   BUN 23 11/26/2021   CREATININE 0.99 11/26/2021   BILITOT <0.2 11/26/2021   ALKPHOS 150 (H) 11/26/2021   AST 21 11/26/2021   ALT 24 11/26/2021   PROT 7.0 11/26/2021   ALBUMIN 4.6 11/26/2021   CALCIUM 9.2 11/26/2021   GFRAA 60 02/12/2020    Speciality Comments: No specialty comments available.  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  Will recheck serum inflammatory markers also check the CCP antibody see if anything more disease specific can be identified.  Discussed association of sometimes abnormal autoimmune testing in the setting of thyroid or neurologic inflammatory conditions as well.  Would not be a good candidate for trial of hydroxychloroquine if there is concern of possible myasthenia gravis.  Esotropia  Patient has a lot of concern for possible ocular myasthenia gravis with nonspecific testing to date.  I discussed that this is not a condition that I am specialized or experience with managing and  would not recommend empiric trial of Mestinon without neurology or neuro-ophthalmology guiding treatment and assessment.  Hypothyroidism (acquired) Other fatigue - Plan: CBC with Differential/Platelet  History of hypothyroidism with numerous complaints including significant myalgias and fatigue will recheck TSH level today.  Also check a CBC with differential rule out anemia.  Fibromyalgia  Widespread pain and sensitivity out of proportion to structural damage inflammation noted on exam.  Does have symptoms consistent with fibromyalgia syndrome including her myalgias, headaches, cognitive impairment, fatigue, and generalized pain sometimes with allodynia.  Will have to monitor closely of starting additional medication for this she is already on Paxil and BuSpar and Seroquel for mood.  Orders: Orders Placed This Encounter  Procedures   Thyroid Panel With TSH   C-reactive protein   Sedimentation rate   Cyclic citrul peptide antibody, IgG   CBC with Differential/Platelet   CBC with Differential/Platelet   No orders of the defined types were placed in this encounter.    Follow-Up Instructions: No follow-ups on file.   Fuller Plan, MD  Note - This record has been created using [employer].  Chart creation errors have been sought, but may not always  have been located. Such creation errors do not reflect on  the standard of medical care.

## 2022-06-02 LAB — CBC WITH DIFFERENTIAL/PLATELET
Absolute Monocytes: 559 cells/uL (ref 200–950)
Basophils Absolute: 59 cells/uL (ref 0–200)
Hemoglobin: 12 g/dL (ref 11.7–15.5)
MPV: 9.9 fL (ref 7.5–12.5)
Neutro Abs: 7673 cells/uL (ref 1500–7800)
RBC: 4.47 10*6/uL (ref 3.80–5.10)

## 2022-06-02 LAB — C-REACTIVE PROTEIN: CRP: 15.8 mg/L — ABNORMAL HIGH (ref <8.0)

## 2022-06-02 LAB — THYROID PANEL WITH TSH: TSH: 3.63 mIU/L (ref 0.40–4.50)

## 2022-06-03 LAB — THYROID PANEL WITH TSH
Free Thyroxine Index: 1.7 (ref 1.4–3.8)
T3 Uptake: 27 % (ref 22–35)
T4, Total: 6.2 ug/dL (ref 5.1–11.9)

## 2022-06-03 LAB — CBC WITH DIFFERENTIAL/PLATELET
Eosinophils Relative: 2.5 %
MCH: 26.8 pg — ABNORMAL LOW (ref 27.0–33.0)
MCHC: 32.6 g/dL (ref 32.0–36.0)
Monocytes Relative: 5.7 %
Platelets: 389 10*3/uL (ref 140–400)
RDW: 15.2 % — ABNORMAL HIGH (ref 11.0–15.0)
Total Lymphocyte: 12.9 %
WBC: 9.8 10*3/uL (ref 3.8–10.8)

## 2022-06-03 LAB — SEDIMENTATION RATE: Sed Rate: 38 mm/h — ABNORMAL HIGH (ref 0–30)

## 2022-06-03 LAB — CYCLIC CITRUL PEPTIDE ANTIBODY, IGG: Cyclic Citrullin Peptide Ab: <16 UNITS

## 2022-06-12 ENCOUNTER — Other Ambulatory Visit: Payer: Self-pay | Admitting: Student

## 2022-06-13 ENCOUNTER — Encounter: Payer: Self-pay | Admitting: Internal Medicine

## 2022-06-14 ENCOUNTER — Other Ambulatory Visit: Payer: Self-pay | Admitting: Orthopaedic Surgery

## 2022-06-14 ENCOUNTER — Telehealth: Payer: Self-pay

## 2022-06-14 DIAGNOSIS — G7000 Myasthenia gravis without (acute) exacerbation: Secondary | ICD-10-CM

## 2022-06-14 NOTE — Telephone Encounter (Signed)
-----   Message from Fuller Plan, MD sent at 06/13/2022  3:02 PM EDT ----- Lab results show persistent but very mild elevation in sed rate at 38 and CRP of 15.8. The CCP antibody test is negative for RA. Her thyroid panel is normal and blood count is normal so these do not explain her fatigue. So far her joint and muscle pains look most consistent with osteoarthritis and fibromyalgia syndrome these would be best managed with her ortho clinic. For the double vision and nerve concerns about myasthenia gravis she needs to follow up with neurology. Previously saw [facility] if interested we could refer her for a second opinion with [facility] or [facility].

## 2022-06-14 NOTE — Progress Notes (Deleted)
    SUBJECTIVE:   CHIEF COMPLAINT / HPI:   Patient with multiple issues, most notably diffuse joint pains, eye and mouth dryness, binocular diplopia, and oropharyngeal dysphagia.  Since last seeing me, she has been seen by ophthalmology, neurology, and rheumatology.  There was some disagreement amongst ophthalmology and neurology as to the possibility of myasthenia gravis with ophthalmology feeling that her presentation was strongly consistent with myasthenia gravis but her formal testing with neurology was negative.  She had MuSK antibody and acetylcholine receptor antibody testing both of which were negative.  She also had normal nerve conduction studies on February 27. By review of her ophthalmology notes, she is due for a repeat evaluation later this month.  Seen by rheumatology just 2 weeks ago.  At that visit, rheumatology made a new diagnosis of fibromyalgia, though note that she has already been on Paxil, BuSpar, Seroquel.  Testing done by neurology was significant for negative anti-CCP antibodies (previously +2 years ago), normal TFTs, very mildly elevated ESR to 38, CRP up at 15.8.  Their recommendation was for her to receive a second opinion regarding the possibility of myasthenia gravis.   I was contacted by her psychiatric provider Dr. Renaldo Fiddler who asked that we obtain an EKG given long-term use of Seroquel.  Her bipolar disorder, generalized anxiety, and ADHD remain well-controlled.  She has longstanding low back pain that she has been seen by both orthopedics and neurosurgery for.  As best I can tell from note review, the last plan per orthopedics (04/21/2022) was to defer to neurosurgery for possible future imaging and surgical intervention.  She does have a history of an MRI (2022) showing: Convex right scoliosis.   Right greater than left subarticular recess narrowing at L1-2 due to bilateral disc protrusions.   Mild left subarticular recess and moderate left foraminal  narrowing at L2-3.   Left subarticular recess and foraminal protrusion at L3-4 could impact the descending left L4 root and causes mild to moderate left foraminal narrowing.   Moderately severe left and mild-to-moderate right foraminal narrowing at L4-5 where there is moderate central canal stenosis.   Mild to moderate right foraminal narrowing L5-S1. The central canal and left foramen are open.   Health Maintenance Mammo ordered in November. Not done yet.     PERTINENT  PMH / PSH: ***  OBJECTIVE:   There were no vitals taken for this visit.  ***  ASSESSMENT/PLAN:   No problem-specific Assessment & Plan notes found for this encounter.     Eliezer Mccoy, MD [facility]

## 2022-06-14 NOTE — Telephone Encounter (Signed)
Patient was last seen on 06/01/2022. Patient had labs done on 06/01/2022. Please advise.

## 2022-06-14 NOTE — Telephone Encounter (Signed)
Referral Placed 

## 2022-06-15 ENCOUNTER — Ambulatory Visit: Payer: Medicare Other | Admitting: Student

## 2022-06-15 DIAGNOSIS — Z79899 Other long term (current) drug therapy: Secondary | ICD-10-CM

## 2022-06-16 ENCOUNTER — Encounter: Payer: Self-pay | Admitting: Internal Medicine

## 2022-06-25 ENCOUNTER — Ambulatory Visit (INDEPENDENT_AMBULATORY_CARE_PROVIDER_SITE_OTHER): Payer: Medicare Other | Admitting: Clinical

## 2022-06-25 ENCOUNTER — Encounter (HOSPITAL_COMMUNITY): Payer: Self-pay

## 2022-06-25 DIAGNOSIS — F411 Generalized anxiety disorder: Secondary | ICD-10-CM | POA: Diagnosis not present

## 2022-06-25 NOTE — Progress Notes (Signed)
THERAPIST PROGRESS NOTE Virtual Visit via Video Note  I connected with Jodi Gilmore on 06/25/22 at  9:00 AM EDT by a video enabled telemedicine application and verified that I am speaking with the correct person using two identifiers.  Location: Patient: home Provider: office   I discussed the limitations of evaluation and management by telemedicine and the availability of in person appointments. The patient expressed understanding and agreed to proceed.   Follow Up Instructions: I discussed the assessment and treatment plan with the patient. The patient was provided an opportunity to ask questions and all were answered. The patient agreed with the plan and demonstrated an understanding of the instructions.   The patient was advised to call back or seek an in-person evaluation if the symptoms worsen or if the condition fails to improve as anticipated.   Session Time: 30 minutes  Participation Level: Active  Behavioral Response: CasualAlertEuthymic  Type of Therapy: Individual Therapy  Treatment Goals addressed: client will identify 3 cognitive patterns and beliefs that support depression  ProgressTowards Goals: Progressing  Interventions: CBT and Supportive  Summary:  Jodi Gilmore is a 67 y.o. female who presents for the scheduled appointment oriented x 5, appropriately dressed, and friendly.  Client denied hallucinations and delusions. Client reported on today she has been doing the same.  Client reported over the past week she was able to have 2 nights of uninterrupted rest.  Client reported otherwise she is usually having difficulty with staying asleep due to dry mouth and sores in her mouth.  Client reported the lack of sleep caused her to feel a little "manic".  Client reported she did impulsively buy some things.  Client reported she has been having difficulty with getting referrals for appropriate testing that she needs to have done for her eyes her mouth and other  things.  Client reported she has been looking to sell some vintage things to help pay for her son's wedding.  Client reported she does not have much money to work with and may put her into financial strain but she does want to help them.  Client reported otherwise she cannot talk that long because her mouth feels inflamed. Evidence of progress towards goal: Client reported being medication compliant 7 days a week as well as practicing 1 positive coping skill of thinking positively about her stressors.  Suicidal/Homicidal: Nowithout intent/plan  Therapist Response:  Therapist began the appointment asking client how she has been doing since last seen. Therapist used CBT to engage using active listening and positive emotional support. Therapist used CBT to engage with the client and ask her about her mood and contributing stressors as it relates to family, health and her daily functioning. Therapist used CBT to normalize the clients emotional response and positively reinforced her optimistic way of thinking and solving problems. Therapist used CBT ask the client to identify her progress with frequency of use with coping skills with continued practice in her daily activity.     Therapist assigned the client homework to practice self care.   Plan: Return again in 5 weeks.  Diagnosis: generalized anxiety disorder  Collaboration of Care: Patient refused AEB none requested by the client.  Patient/Guardian was advised Release of Information must be obtained prior to any record release in order to collaborate their care with an outside provider. Patient/Guardian was advised if they have not already done so to contact the registration department to sign all necessary forms in order for [place] to release information regarding their care.  Consent: Patient/Guardian gives verbal consent for treatment and assignment of benefits for services provided during this visit. Patient/Guardian expressed understanding and  agreed to proceed.   Neena Rhymes Edrik Rundle, LCSW 06/25/2022

## 2022-06-28 ENCOUNTER — Other Ambulatory Visit: Payer: Self-pay

## 2022-06-28 DIAGNOSIS — R110 Nausea: Secondary | ICD-10-CM

## 2022-06-29 ENCOUNTER — Ambulatory Visit (HOSPITAL_COMMUNITY)
Admission: RE | Admit: 2022-06-29 | Discharge: 2022-06-29 | Disposition: A | Discharge status: Home / Self Care | Payer: Medicare Other | Source: Ambulatory Visit | Attending: Family Medicine | Admitting: Family Medicine

## 2022-06-29 ENCOUNTER — Ambulatory Visit (INDEPENDENT_AMBULATORY_CARE_PROVIDER_SITE_OTHER): Payer: Medicare Other | Admitting: Student

## 2022-06-29 VITALS — BP 118/76 | HR 109 | Ht 65.0 in | Wt 193.1 lb

## 2022-06-29 DIAGNOSIS — Z79899 Other long term (current) drug therapy: Secondary | ICD-10-CM | POA: Insufficient documentation

## 2022-06-29 DIAGNOSIS — Z6832 Body mass index (BMI) 32.0-32.9, adult: Secondary | ICD-10-CM | POA: Diagnosis not present

## 2022-06-29 DIAGNOSIS — R079 Chest pain, unspecified: Secondary | ICD-10-CM | POA: Diagnosis present

## 2022-06-29 DIAGNOSIS — E611 Iron deficiency: Secondary | ICD-10-CM | POA: Diagnosis not present

## 2022-06-29 DIAGNOSIS — Z0000 Encounter for general adult medical examination without abnormal findings: Secondary | ICD-10-CM

## 2022-06-29 DIAGNOSIS — R400 Somnolence: Secondary | ICD-10-CM | POA: Diagnosis not present

## 2022-06-29 DIAGNOSIS — R5383 Other fatigue: Secondary | ICD-10-CM | POA: Diagnosis not present

## 2022-06-29 DIAGNOSIS — R1310 Dysphagia, unspecified: Secondary | ICD-10-CM | POA: Diagnosis not present

## 2022-06-29 MED ORDER — ONDANSETRON HCL 4 MG PO TABS
4.0000 mg | ORAL_TABLET | Freq: Three times a day (TID) | ORAL | 0 refills | Status: DC | PRN
Start: 2022-06-29 — End: 2022-07-27

## 2022-06-29 NOTE — Patient Instructions (Addendum)
I have ordered your mammogram and Bone Density Scan. These will be done at the [facility], right across the street from our clinic. You will need to call to make these appointments. Their number is [phone].  The address is [address], [place], [place] [address]  The sleep center will call you to schedule a sleep study.  We are checking lots of labs today, I will be in touch.  We are going to check an echocardiogram for you. I am also going to have our cardiologists evaluate you for a possible stress test of your heart.  I have also referred you to our GI specialists for your EGD.   Eliezer Mccoy, MD

## 2022-06-29 NOTE — Progress Notes (Unsigned)
SUBJECTIVE:   CHIEF COMPLAINT / HPI:   Brings in a list of issues to discuss today   List reviewed and agenda set: given that both chest pain and issues with gasping for breath were present, we elected to discuss those today and table the remainder for future visits.    Chest Pain She describes episodic, squeezing chest pain behind her left breast that sometimes radiates to the L shoulder and can be accompanied by shortness of breath. Has been present for a few years. Lasts for several minutes, sometimes at rest, sometimes with activity. She tells me that when she was living in a different state ([place], maybe[place] her medical team thought she might be having heart attacks but it was found to be more consistent with esophageal spasm.  Unfortunately, I am unable to see any record of cardiac workup being done in our system or in Care Everywhere. She also mentions history of possible mitral valve prolapse that was not intervened upon. She is not having any symptoms at present.   Dysphagia While on the topic of her chest pain, she mentioned a workup for dysphagia that has been ongoing with [facility]. She had a barium swallow that was normal and says that they had been hoping to next pursue esophageal imaging to evaluate for esophageal phase dysphagia. In CareEverywhere I am able to see where an Esophagram was ordered but not completed. She has limited transportation options and so requests that we transfer her care to [facility] here in town. She saw them for her colonoscopy and has faith in them.   Episodic Gasping for Air Daytime Sleepiness  Fatigue Tells me that this usually happens when she is lying down, but can also happen when she is sitting up. It is like she "forgets to breathe" and jolts up gasping for air. Doesn't think she's necessarily asleep when this happens but "I might be." Tells me she may have had a sleep study ~40 years ago but has never been treated for sleep  apnea.   Snoring history - not sure, lives alone  Tired during day - Yes  Observed apneas/choking - Yes  Pressure being treated - Yes  BMI > 35 kg/m2 - No  Age > 50 years - Yes  Neck Circumference > 40 cm - No (39cm) Female Gender - No   Has been much more tired recently and easily fatigued. Even minor house chores like cooking feel like maximum effort outputs. She has to go lie down even after making a grilled cheese. She is concerned this may be due to a vitmain deficiency as she stopped taking all of her vitamins due to the dysphagia discussed above. Of note, has known hypothyroidism but had a normal TSH one month ago. Also had a CBC remarkable only for a minimally decreased MCHC.   Longterm use of antipsychotic medications Her psychiatrist, Dr. Renaldo Fiddler reached out to me and requested that I get an EKG at this visit due to her long-standing use of Seroquel for Bipolar Disorder   HealthCare Maintenance Needs mammogram and DEXA, these were ordered last visit but she hasn't scheduled yet.   PERTINENT  PMH / PSH: Bipolar Disorder, GAD, ADHD  OBJECTIVE:   BP 118/76   Pulse (!) 109   Ht 5\' 5"  (1.651 m)   Wt 193 lb 2 oz (87.6 kg)   SpO2 94%   BMI 32.14 kg/m   Physical Exam Vitals reviewed.  Constitutional:  Comments: Anxious appearing but NAD  HENT:     Mouth/Throat:     Mouth: Mucous membranes are moist.  Neck:     Comments: Circumference 39cm Cardiovascular:     Comments: Tachycardic, 2/6 Systolic Murmur heard best at RUSB/LUSB, pulses normal. Without LE edema.  Pulmonary:     Effort: Pulmonary effort is normal.     Breath sounds: No wheezing, rhonchi or rales.  Musculoskeletal:        General: No swelling or deformity.  Skin:    Capillary Refill: Capillary refill takes less than 2 seconds.  Neurological:     General: No focal deficit present.      ASSESSMENT/PLAN:   Chest pain This is a bit of a concerning history given the location and character of her  pain. Somewhat reassuring that symptoms are non-exertional. Symptom free at present. EKG obtained today without evidence of ischemic changes. Not sure how much to make of her sinus tachycardia as this is most likely driven by her anxiety, but the murmur catches my attention as I do not recall hearing it the first time I met her about seven months ago and do not see a documented history of any murmurs. The reported history of (?)MV prolapse certainly would place MV regurg on the differential for causes of her murmurs.  Certainly warrants evaluation. It is possible, of course, that this represents ongoing esophageal spasm, so will have GI weigh in as well. - ECG today remarkable only for sinus tachycardia - Echo complete ordered today - Referral to cardiology, may benefit from stress test vs CT coronary angio  - Referral to [facility] as she transfers her care her from [facility] due to lack of transport   Dysphagia Workup for myasthenia gravis has been unrevealing, though she is seeing a new neurologist for a second opinion. I wonder if she would benefit from an EGD and possible esophageal manometry. - Referred to [facility] as above   Daytime sleepiness Her tiredness taken together with the history of gasping for air while dozing and STOPBANG of 4-5 are quite concerning for possible sleep apnea. Will also obtain labs to evaluate other possible contributors. She requests Vit D, Mag, and K testing as she was previously supplementing all of these but stopped due to the dysphagia. TSH normal 4 weeks ago so do not see a need to repeat.  - Iron, TIBC, Ferritin to evaluate for iron deficiency - Vit D, Mag, BMP  - Sleep study ordered to [place] [facility]   High risk medications (not anticoagulants) long-term use QTc 413. Okay to continue Seroquel.  Healthcare maintenance Patient to call and schedule mammogram and DEXA     J Dorothyann Gibbs, MD [facility]

## 2022-06-30 DIAGNOSIS — R079 Chest pain, unspecified: Secondary | ICD-10-CM | POA: Insufficient documentation

## 2022-06-30 DIAGNOSIS — R400 Somnolence: Secondary | ICD-10-CM | POA: Insufficient documentation

## 2022-06-30 DIAGNOSIS — Z79899 Other long term (current) drug therapy: Secondary | ICD-10-CM | POA: Insufficient documentation

## 2022-06-30 LAB — BASIC METABOLIC PANEL
BUN/Creatinine Ratio: 21 (ref 12–28)
BUN: 23 mg/dL (ref 8–27)
CO2: 22 mmol/L (ref 20–29)
Calcium: 10 mg/dL (ref 8.7–10.3)
Chloride: 100 mmol/L (ref 96–106)
Creatinine, Ser: 1.08 mg/dL — ABNORMAL HIGH (ref 0.57–1.00)
Glucose: 104 mg/dL — ABNORMAL HIGH (ref 70–99)
Potassium: 4.5 mmol/L (ref 3.5–5.2)
Sodium: 138 mmol/L (ref 134–144)
eGFR: 57 mL/min/{1.73_m2} — ABNORMAL LOW (ref >59)

## 2022-06-30 LAB — CBC
Hematocrit: 36.5 % (ref 34.0–46.6)
Hemoglobin: 11.8 g/dL (ref 11.1–15.9)
MCH: 27.5 pg (ref 26.6–33.0)
MCHC: 32.3 g/dL (ref 31.5–35.7)
MCV: 85 fL (ref 79–97)
Platelets: 405 10*3/uL (ref 150–450)
RBC: 4.29 x10E6/uL (ref 3.77–5.28)
RDW: 15.5 % — ABNORMAL HIGH (ref 11.7–15.4)
WBC: 9.9 10*3/uL (ref 3.4–10.8)

## 2022-06-30 LAB — IRON,TIBC AND FERRITIN PANEL
Ferritin: 17 ng/mL (ref 15–150)
Iron Saturation: 10 % — ABNORMAL LOW (ref 15–55)
Iron: 41 ug/dL (ref 27–139)
Total Iron Binding Capacity: 409 ug/dL (ref 250–450)
UIBC: 368 ug/dL (ref 118–369)

## 2022-06-30 LAB — VITAMIN D 25 HYDROXY (VIT D DEFICIENCY, FRACTURES): Vit D, 25-Hydroxy: 33.5 ng/mL (ref 30.0–100.0)

## 2022-06-30 LAB — MAGNESIUM: Magnesium: 2.4 mg/dL — ABNORMAL HIGH (ref 1.6–2.3)

## 2022-06-30 MED ORDER — IRON (FERROUS SULFATE) 325 (65 FE) MG PO TABS
1.0000 | ORAL_TABLET | ORAL | 1 refills | Status: DC
Start: 2022-06-30 — End: 2022-07-27

## 2022-06-30 NOTE — Assessment & Plan Note (Addendum)
This is a bit of a concerning history given the location and character of her pain. Somewhat reassuring that symptoms are non-exertional. Symptom free at present. EKG obtained today without evidence of ischemic changes. Not sure how much to make of her sinus tachycardia as this is most likely driven by her anxiety, but the murmur catches my attention as I do not recall hearing it the first time I met her about seven months ago and do not see a documented history of any murmurs. The reported history of (?)MV prolapse certainly would place MV regurg on the differential for causes of her murmurs.  Certainly warrants evaluation. It is possible, of course, that this represents ongoing esophageal spasm, so will have GI weigh in as well. - ECG today remarkable only for sinus tachycardia - Echo complete ordered today - Referral to cardiology, may benefit from stress test vs CT coronary angio  - Referral to [facility] as she transfers her care her from [facility] due to lack of transport

## 2022-06-30 NOTE — Assessment & Plan Note (Signed)
QTc 413. Okay to continue Seroquel.

## 2022-06-30 NOTE — Assessment & Plan Note (Signed)
Patient to call and schedule mammogram and DEXA

## 2022-06-30 NOTE — Assessment & Plan Note (Signed)
Workup for myasthenia gravis has been unrevealing, though she is seeing a new neurologist for a second opinion. I wonder if she would benefit from an EGD and possible esophageal manometry. - Referred to [facility] as above

## 2022-06-30 NOTE — Assessment & Plan Note (Signed)
Her tiredness taken together with the history of gasping for air while dozing and STOPBANG of 4-5 are quite concerning for possible sleep apnea. Will also obtain labs to evaluate other possible contributors. She requests Vit D, Mag, and K testing as she was previously supplementing all of these but stopped due to the dysphagia. TSH normal 4 weeks ago so do not see a need to repeat.  - Iron, TIBC, Ferritin to evaluate for iron deficiency - Vit D, Mag, BMP  - Sleep study ordered to [facility]

## 2022-07-04 ENCOUNTER — Encounter: Payer: Self-pay | Admitting: Student

## 2022-07-09 ENCOUNTER — Other Ambulatory Visit: Payer: Self-pay | Admitting: Orthopaedic Surgery

## 2022-07-09 ENCOUNTER — Encounter: Payer: Self-pay | Admitting: Orthopaedic Surgery

## 2022-07-13 ENCOUNTER — Other Ambulatory Visit: Payer: Self-pay | Admitting: Student

## 2022-07-13 ENCOUNTER — Encounter: Payer: Self-pay | Admitting: Internal Medicine

## 2022-07-14 ENCOUNTER — Other Ambulatory Visit: Payer: Self-pay | Admitting: Student

## 2022-07-16 ENCOUNTER — Telehealth (INDEPENDENT_AMBULATORY_CARE_PROVIDER_SITE_OTHER): Payer: Medicare Other | Admitting: Student

## 2022-07-16 ENCOUNTER — Encounter (HOSPITAL_COMMUNITY): Payer: Self-pay

## 2022-07-16 ENCOUNTER — Encounter: Payer: Self-pay | Admitting: Student

## 2022-07-16 DIAGNOSIS — F3160 Bipolar disorder, current episode mixed, unspecified: Secondary | ICD-10-CM | POA: Diagnosis not present

## 2022-07-16 DIAGNOSIS — F411 Generalized anxiety disorder: Secondary | ICD-10-CM

## 2022-07-16 MED ORDER — PAROXETINE HCL 40 MG PO TABS
40.0000 mg | ORAL_TABLET | Freq: Every day | ORAL | 2 refills | Status: DC
Start: 2022-07-16 — End: 2022-09-10

## 2022-07-16 NOTE — Progress Notes (Addendum)
BH MD/PA/NP OP Progress Note  Virtual Visit via Video Note  I connected with Jodi Gilmore on 07/16/22 at  8:00 AM EDT by a video enabled telemedicine application and verified that I am speaking with the correct person using two identifiers.  Location: Patient: Home Provider: [facility]   I discussed the limitations of evaluation and management by telemedicine and the availability of in person appointments. The patient expressed understanding and agreed to proceed.   07/16/2022 10:55 AM Jodi Gilmore  MRN:  [id number]  Chief Complaint:  Chief Complaint  Patient presents with   Follow-up   HPI:  Jodi Gilmore is a 67 yr old female with a history of bipolar affective disorder and ADHD, with previous psychiatric admissions, last one was 40 years ago in [place] for severe depression.  The patient denies prior suicide attempts.  She has been in the clinic for over a year, first with Karel Jarvis, then Dr. Arna Snipe, and now myself.  Today she reports continued difficulties with anxiety.  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  She is currently not working due to health issues.  She states that she is unable to drive due to impaired vision, and she also states that she is unable to stand for more than a few minutes at a time.  She states that she does not go out of the house and has a caregiver come to her home 1 time per week.  She states that she has a son in [place] that they have a decent relationship.  She states that she has no other close relationships.  Patient states that she is currently engaged in therapy with [facility].  She says that she spends most of the day lying down.  She reports continued issues with anxiety especially at nighttime or when she goes out to get her medications from the pharmacy.  She says that her depression comes and goes and is not a prominent issue right now.  She says that she has a strong will to live.  She denies  experiencing any suicidal thoughts.  She reports full adherence with her medication regimen.   Visit Diagnosis:    ICD-10-CM   1. Bipolar affective disorder, current episode mixed, current episode severity unspecified (HCC)  F31.60 PARoxetine (PAXIL) 40 MG tablet    2. Generalized anxiety disorder  F41.1 PARoxetine (PAXIL) 40 MG tablet       Past Psychiatric History: Bipolar Disorder,  GAD, and ADHD.   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    Hip pain    HLA B27 (HLA B27 positive)    Hyperlipidemia    Hypertension    Knee pain    Memory changes    Morphea scleroderma    Neuromuscular  disorder (HCC)    Raynauds    Pneumonia    Psoriatic arthritis (HCC)    seen in ears only   Raynaud's disease    Sjogren syndrome with dental involvement (HCC)     Past Surgical History:  Procedure Laterality Date   CYST REMOVAL HAND     left arm posterior   laproscopy     SALPINGECTOMY  1980   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   Procedure: LEFT TOTAL HIP ARTHROPLASTY ANTERIOR APPROACH;  Surgeon: Kathryne Hitch, MD;  Location: [facility];  Service: Orthopedics;  Laterality: Left;   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      Family Psychiatric History: Nephew - Schizophrenia Father - patient suspects that her father may be bipolar   Family History:  Family History  Problem Relation Age of Onset   Heart disease Mother    Congestive Heart Failure Mother    Dementia Mother     Heart disease Father    Heart attack Father    Dementia Sister    Hemachromatosis Sister    Parkinson's disease Sister    Allergies Son    Healthy Son    Colon cancer Neg Hx    Colon polyps Neg Hx    Esophageal cancer Neg Hx    Stomach cancer Neg Hx    Rectal cancer Neg Hx     Social History:  Social History   Socioeconomic History   Marital status: Single    Spouse name: Not on file   Number of children: Not on file   Years of education: Not on file   Highest education level: Not on file  Occupational History   Occupation: Unemployed  Tobacco Use   Smoking status: Former    Passive exposure: Past   Smokeless tobacco: Never  Building services engineer Use: Never used  Substance and Sexual Activity   Alcohol use: Not Currently   Drug use: Never   Sexual activity: Not on file  Other Topics Concern   Not on file  Social History Narrative   Not on file   Social Determinants of Health   Financial Resource Strain: Medium Risk (06/11/2022)   Overall Financial Resource Strain (CARDIA)    Difficulty of Paying Living Expenses: Somewhat hard  Food Insecurity: No Food Insecurity (06/11/2022)   Hunger Vital Sign    Worried About Running Out of Food in the Last Year: Never true    Ran Out of Food in the Last Year: Never true  Transportation Needs: Unmet Transportation Needs (06/11/2022)   PRAPARE - Transportation    Lack of Transportation (Medical): Yes    Lack of Transportation (Non-Medical): Yes  Physical Activity: Inactive (06/11/2022)   Exercise Vital Sign    Days of Exercise per Week: 0 days    Minutes of Exercise per Session: 0 min  Stress: Stress Concern Present (06/11/2022)   [employer] of Occupational Health - Occupational Stress Questionnaire    Feeling of Stress : Very much  Social Connections: Socially Isolated (06/11/2022)   Social Connection and Isolation Panel [NHANES]    Frequency of Communication with Friends and Family: Once a week    Frequency of  Social Gatherings with Friends and Family: Never    Attends Religious Services: Never    Database administrator or Organizations: No    Attends Banker Meetings: Never    Marital Status: Divorced    Allergies:  Allergies  Allergen Reactions   Contrast Media [Iodinated Contrast  Media] Anaphylaxis   Prohance [Gadoteridol] Anaphylaxis   Ptu [Propylthiouracil]    Sulfa Antibiotics     Unknown reaction, family history   Tapazole [Methimazole]    Latex Hives and Rash    Metabolic Disorder Labs: Lab Results  Component Value Date   HGBA1C 5.4 11/26/2021   No results found for: "PROLACTIN" Lab Results  Component Value Date   CHOL 272 (H) 02/12/2020   TRIG 145 02/12/2020   HDL 71 02/12/2020   CHOLHDL 3.8 02/12/2020   LDLCALC 175 (H) 02/12/2020   Lab Results  Component Value Date   TSH 3.63 06/01/2022   TSH 7.500 (H) 11/26/2021   TSH CANCELED 11/26/2021    Therapeutic Level Labs: No results found for: "LITHIUM" No results found for: "VALPROATE" No results found for: "CBMZ"  Current Medications: Current Outpatient Medications  Medication Sig Dispense Refill   PARoxetine (PAXIL) 40 MG tablet Take 1 tablet (40 mg total) by mouth daily. 30 tablet 2   acetaminophen (TYLENOL) 325 MG tablet Take 650 mg by mouth every 6 (six) hours as needed for moderate pain.     amphetamine-dextroamphetamine (ADDERALL) 10 MG tablet Take 1 tablet (10 mg total) by mouth 2 (two) times daily with a meal. 60 tablet 0   amphetamine-dextroamphetamine (ADDERALL) 10 MG tablet Take 1 tablet (10 mg total) by mouth 2 (two) times daily with a meal. 60 tablet 0   aspirin-acetaminophen-caffeine (EXCEDRIN MIGRAINE) 250-250-65 MG tablet Take 2 tablets by mouth every 6 (six) hours as needed for headache.     atorvastatin (LIPITOR) 40 MG tablet TAKE ONE TABLET BY MOUTH ONE TIME DAILY 90 tablet 0   busPIRone (BUSPAR) 10 MG tablet Take 1 tablet (10 mg total) by mouth 3 (three) times daily. 90 tablet 1    cetirizine (ZYRTEC ALLERGY) 10 MG tablet Take 1 tablet (10 mg total) by mouth daily. 90 tablet 1   fluticasone (FLONASE) 50 MCG/ACT nasal spray USE TWO SPRAYS IN EACH NOSTRIL ONE TIME DAILY 16 mL 6   Iron, Ferrous Sulfate, 325 (65 Fe) MG TABS Take 1 tablet by mouth every other day. 90 tablet 1   levothyroxine (SYNTHROID) 112 MCG tablet Take 1 tablet (112 mcg total) by mouth every morning. 30 minutes before food 90 tablet 3   lisinopril (ZESTRIL) 2.5 MG tablet TAKE ONE TABLET BY MOUTH AT BEDTIME 90 tablet 1   methocarbamol (ROBAXIN) 500 MG tablet TAKE ONE TABLET BY MOUTH EVERY 6 HOURS AS NEEDED FOR MUSCLE SPASMS 40 tablet 1   omeprazole (PRILOSEC) 20 MG capsule TAKE ONE CAPSULE BY MOUTH ONE TIME DAILY 90 capsule 0   ondansetron (ZOFRAN) 4 MG tablet Take 1 tablet (4 mg total) by mouth every 8 (eight) hours as needed for nausea or vomiting. 20 tablet 0   QUEtiapine (SEROQUEL) 400 MG tablet Take 1 tablet (400 mg total) by mouth at bedtime. 30 tablet 1   RESTASIS 0.05 % ophthalmic emulsion INSTILL ONE DROP INTO EACH EYE TWICE DAILY 60 each 3   rizatriptan (MAXALT) 10 MG tablet TAKE ONE TABLET BY MOUTH AT ONSET OF HEADACHE. MAY REPEAT DOSE WITH ONE TABLET IN TWO HOURS IF NEEDED. DO NOT EXCEED TWO TABLETS IN 24 HOURS. 30 tablet 0   traMADol (ULTRAM) 50 MG tablet Take 50 mg by mouth every 6 (six) hours as needed.     valACYclovir (VALTREX) 500 MG tablet TAKE ONE TABLET BY MOUTH TWICE A DAY FOR 7 DAYS 30 tablet 2   No current facility-administered medications for this  visit.     Psychiatric Specialty Exam: Physical Exam Constitutional:      Appearance: the patient is not toxic-appearing.  Pulmonary:     Effort: Pulmonary effort is normal.  Neurological:     General: No focal deficit present.     Mental Status: the patient is alert and oriented to person, place, and time.   Review of Systems  Respiratory:  Negative for shortness of breath.   Cardiovascular:  Negative for chest pain.   Gastrointestinal:  Negative for abdominal pain, constipation, diarrhea, nausea and vomiting.  Neurological:  Negative for headaches.      There were no vitals taken for this visit.  General Appearance: Fairly Groomed  Eye Contact:  Good  Speech:  Clear and Coherent  Volume:  Normal  Mood:  "okay"  Affect:  somewhat depressed  Thought Process:  Coherent  Orientation:  Full (Time, Place, and Person)  Thought Content: Logical   Suicidal Thoughts:  No  Homicidal Thoughts:  No  Memory:  Immediate;   Good  Judgement:  fair  Insight:  fair  Psychomotor Activity:  Normal  Concentration:  Concentration: Good  Recall:  Good  Fund of Knowledge: Good  Language: Good  Akathisia:  No  Handed:  not assessed  AIMS (if indicated): not done  Assets:  Communication Skills Desire for Improvement Financial Resources/Insurance Housing Leisure Time Physical Health  ADL's:  Intact  Cognition: WNL  Sleep:  Fair     Screenings: GAD-7    Flowsheet Row Video Visit from 08/26/2021 in [facility] Video Visit from 06/24/2021 in [facility] Video Visit from 04/22/2021 in [facility] Video Visit from 03/20/2021 in [facility] Video Visit from 02/19/2021 in [facility]  Total GAD-7 Score 9 10 14 13 11       PHQ2-9    Flowsheet Row Office Visit from 06/29/2022 in [facility] Clinical Support from 03/09/2022 in [facility] Office Visit from 11/26/2021 in [facility] Video Visit from 08/26/2021 in [facility] Video Visit from 06/24/2021 in [facility]  PHQ-2 Total Score 6 2 4 6 3   PHQ-9 Total Score 19 15 13 18 16       Flowsheet Row Video Visit from 08/26/2021 in [facility] Video Visit from 06/24/2021 in [facility] Counselor from 05/05/2021 in [facility]  C-SSRS RISK CATEGORY Low Risk Low Risk Error: Question 6 not populated        Assessment and Plan:   Bipolar Disorder, Current Episode Mixed: -Continue Seroquel 400 mg QHS.  30 tablets with 1 refill. - EKG June 2024 QTc 413 - A1c of 5.6, no recent lipid panel - Continue therapy with [facility] - Information given for [facility]     GAD: -Increase Paxil from 30 mg daily to 40 mg daily for depression and anxiety -Continue Buspar 10 mg TID.  90 tablets with 1 refill.     ADHD: -Continue Adderall 10 mg BID.  30 tablets with 1 refill, first fill 6/6.      Collaboration of Care:   Patient/Guardian was advised Release of Information must be obtained prior to any record release in order to collaborate their care with an outside provider. Patient/Guardian was advised if they have not already done so to contact the registration  department to sign all necessary forms in order for [place] to release information regarding their care.   Consent: Patient/Guardian gives verbal consent for treatment and assignment of benefits for services provided during this visit. Patient/Guardian expressed understanding and agreed to proceed.    Carlyn Reichert, MD 07/16/2022, 10:55 AM   Follow Up Instructions:    I discussed the assessment and treatment plan with the patient. The patient was provided an opportunity to ask questions and all were answered. The patient agreed with the plan and demonstrated an understanding of the instructions.   The patient was advised to call back or seek an in-person evaluation if the symptoms worsen or if the condition fails to improve as anticipated.  I provided 25 minutes of non-face-to-face time during this encounter.

## 2022-07-16 NOTE — Telephone Encounter (Signed)
Can you please put in mail?

## 2022-07-16 NOTE — Patient Instructions (Addendum)
[facility]: Phone: [phone] Email: [email or web address]

## 2022-07-20 ENCOUNTER — Telehealth: Payer: Self-pay

## 2022-07-20 ENCOUNTER — Encounter: Payer: Self-pay | Admitting: Student

## 2022-07-20 NOTE — Telephone Encounter (Signed)
Medicare does not require pre authorization for [facility]. Ok to schedule.  Sunday Spillers, CMA '

## 2022-07-21 ENCOUNTER — Encounter: Payer: Self-pay | Admitting: Student

## 2022-07-22 ENCOUNTER — Other Ambulatory Visit: Payer: Self-pay | Admitting: Student

## 2022-07-22 DIAGNOSIS — F5104 Psychophysiologic insomnia: Secondary | ICD-10-CM

## 2022-07-22 MED ORDER — DOXEPIN HCL 10 MG/ML PO CONC
3.0000 mg | Freq: Every day | ORAL | 12 refills | Status: DC
Start: 2022-07-22 — End: 2022-09-10

## 2022-07-22 NOTE — Progress Notes (Signed)
d 

## 2022-07-26 ENCOUNTER — Other Ambulatory Visit: Payer: Self-pay | Admitting: Student

## 2022-07-26 DIAGNOSIS — E611 Iron deficiency: Secondary | ICD-10-CM

## 2022-07-27 ENCOUNTER — Other Ambulatory Visit: Payer: Self-pay | Admitting: *Deleted

## 2022-07-27 DIAGNOSIS — R110 Nausea: Secondary | ICD-10-CM

## 2022-07-27 MED ORDER — ONDANSETRON HCL 4 MG PO TABS
4.0000 mg | ORAL_TABLET | Freq: Three times a day (TID) | ORAL | 0 refills | Status: DC | PRN
Start: 2022-07-27 — End: 2022-08-23

## 2022-07-29 ENCOUNTER — Ambulatory Visit (HOSPITAL_COMMUNITY): Payer: Medicare Other | Admitting: Student

## 2022-08-05 ENCOUNTER — Telehealth (HOSPITAL_COMMUNITY): Payer: Self-pay | Admitting: *Deleted

## 2022-08-05 NOTE — Telephone Encounter (Signed)
Fax received for refill request of Quetiapine 400mg . Next appointment 09/10/22.

## 2022-08-06 ENCOUNTER — Other Ambulatory Visit (HOSPITAL_COMMUNITY): Payer: Self-pay | Admitting: Student

## 2022-08-06 ENCOUNTER — Telehealth (HOSPITAL_COMMUNITY): Payer: Self-pay

## 2022-08-06 DIAGNOSIS — F3160 Bipolar disorder, current episode mixed, unspecified: Secondary | ICD-10-CM

## 2022-08-06 MED ORDER — QUETIAPINE FUMARATE 400 MG PO TABS
400.0000 mg | ORAL_TABLET | Freq: Every day | ORAL | 1 refills | Status: DC
Start: 2022-08-06 — End: 2022-09-10

## 2022-08-06 NOTE — Telephone Encounter (Signed)
Medication refill - Telephone call with patient, after she had left a message requesitng a new Adderall 10 mg order be sent into her [facility]. Patient acknowledged this was too early and still has medications for approximately a week but just wants to make sure the order is sent in before she runs out 08/14/22.  Patient also wanted to let Dr. Jerrel Ivory know from his note 07/16/22 that he had documented incorrectly how long it had been since she was inpatient, that this was more then 40 years ago, not 4 years ago and that patient requests this be changed and reflected in her note correctly, stating she sees multiple providers and does not want them to get misinformation.  Agreed to inform Dr. Jerrel Ivory of this request from patient for the correction. Patient's next appointment set for 09/10/22.

## 2022-08-08 ENCOUNTER — Other Ambulatory Visit (HOSPITAL_COMMUNITY): Payer: Self-pay | Admitting: Student

## 2022-08-08 DIAGNOSIS — F909 Attention-deficit hyperactivity disorder, unspecified type: Secondary | ICD-10-CM

## 2022-08-08 MED ORDER — AMPHETAMINE-DEXTROAMPHETAMINE 10 MG PO TABS: 10.0000 mg | ORAL_TABLET | Freq: Two times a day (BID) | ORAL | 0 refills | Status: DC

## 2022-08-09 NOTE — Telephone Encounter (Signed)
Medication management - Message left for patient that Dr. Jerrel Ivory had sent in her requested new Adderall 10 mg order to be filled later this week and also informed they could discuss what documentation needed to be revised at their next appt 09/10/22. Patient to call back if any questions or concerns prior to then.

## 2022-08-10 ENCOUNTER — Encounter: Payer: Self-pay | Admitting: Student

## 2022-08-10 ENCOUNTER — Ambulatory Visit (HOSPITAL_BASED_OUTPATIENT_CLINIC_OR_DEPARTMENT_OTHER): Payer: Medicare Other | Admitting: Internal Medicine

## 2022-08-10 ENCOUNTER — Other Ambulatory Visit: Payer: Self-pay

## 2022-08-10 ENCOUNTER — Ambulatory Visit: Payer: Medicare Other | Admitting: Student

## 2022-08-10 ENCOUNTER — Telehealth: Payer: Self-pay

## 2022-08-10 VITALS — BP 122/77 | HR 106 | Ht 64.0 in | Wt 198.8 lb

## 2022-08-10 DIAGNOSIS — K921 Melena: Secondary | ICD-10-CM | POA: Diagnosis not present

## 2022-08-10 DIAGNOSIS — H532 Diplopia: Secondary | ICD-10-CM | POA: Diagnosis present

## 2022-08-10 DIAGNOSIS — I890 Lymphedema, not elsewhere classified: Secondary | ICD-10-CM

## 2022-08-10 DIAGNOSIS — R42 Dizziness and giddiness: Secondary | ICD-10-CM | POA: Diagnosis not present

## 2022-08-10 MED ORDER — OMEPRAZOLE 40 MG PO CPDR
40.0000 mg | DELAYED_RELEASE_CAPSULE | Freq: Two times a day (BID) | ORAL | 3 refills | Status: AC
Start: 2022-08-10 — End: ?

## 2022-08-10 NOTE — Telephone Encounter (Signed)
[facility] calls nurse line in regards to recent US order.   She reports if the provider is looking for swollen lymph nodes in inguinal region the order will need to be changed.   Please change order if appropriate or leave as is.   Order: US Pelvic Limited.   Will forward to PCP.

## 2022-08-10 NOTE — Progress Notes (Signed)
    SUBJECTIVE:   CHIEF COMPLAINT / HPI:   Paxil recently increased by Dr. Jerrel Ivory.   Diplopia Present for quite some time now (>10 years) but getting worse.  I had previously sent her to Dr. Burgess Estelle at [facility] who referred her on to Dr. Melany Guernsey  at [facility]. She has had workup for Myasthenia Gravis with neurology that has been unremarkable: normal EMG, neg MuSK Ab and Neg anti-acetylcholine. She had some workup for this while she was in [place] before moving to [place] and brings prior records with her.  MRI of the Brain in 2012 at [facility]: Impression: small 9 mm mass abutting the superior wall of the left lateral ventricle. This represents a benign lesion.  F/u MRI in 2019 at [facility]: Impression: There is a focal area of increased signal intensity on T2 and FLAIR sequence shows superior to the posterior aspect left lateral ventricle without surrounding edema or mass effectA punctate focus of increased signal is seen in the right frontal parietal region. Sequela of old infarcts cannot be ruled out. There is no evidence for restricted diffusion to suggest an acute area of ischemia.   She feels that her symptoms are getting worse, notably at the past few weeks.  She is especially having new dizziness even with her eyes closed.  It is not positional, but can happen when she is sitting, lying, or standing.  She has had several stumbling events while walking which are new to her.  Difficult to say if this is due to abnormal vision versus disequilibrium.  She also expresses frustration that she feels she is getting bounced around from specialist to specialist as she is now awaiting an appointment later this fall with Dr. Hilda Lias at [facility] for further neuromuscular testing. She remains with high suspicion for myasthenia gravis given ocular symptoms and difficulty swallowing. Also has an upcoming appointment with [facility] for  workup of Iron deficiency/possible EGD as discussed below.   Black Tarry Stools Started on iron supplementation after our last visit for iron deficiency (Ferritin 17, Sat 10%). Has been having frequent tarry stools since then. Also notes ongoing epigastric pain. Was previously referred to GI. Has appointment upcoming. Denies any frank blood in the stool. + Nausea. Currently taking omeprazole 20mg  BID.   Lower Extremity Edema Noticed for 1-2 weeks. No history of the same. No history of CHF, renal failure, or liver disease. Has not tried anything for it. Equal on both sides.    OBJECTIVE:   BP 122/77   Pulse (!) 106   Ht 5\' 4"  (1.626 m)   Wt 198 lb 12.8 oz (90.2 kg)   SpO2 93%   BMI 34.12 kg/m   Gen: In good spirits, NAD Neuro: Antalgic gait, unchanged from prior assessments  ASSESSMENT/PLAN:   No problem-specific Assessment & Plan notes found for this encounter.     Eliezer Mccoy, MD [facility]

## 2022-08-10 NOTE — Patient Instructions (Signed)
MRI Brain will happen at [facility], we will have to run this through your insurance but we should not have any issues here. We will call you to get it covered.   I would also like to ultrasound your pelvis to look at causes of your lymphedema. The best treatment here is compression, compression, compression. These are available OTC. Look for graduated stockings (10-20).   For your stools, I want you on twice a day PPI until you see GI. I sent a new script.  I am also rechecking your Hgb today to make sure it is not dropping too rapidly.   Please reschedule your sleep study.   Once we get the MRI back, we can coordinate with the docs at [facility] to try and get things moving more quickly.   Eliezer Mccoy, MD

## 2022-08-10 NOTE — Telephone Encounter (Signed)
Received returned call from [place] at [facility]. She reports that there are several different orders that could be used for Lymphedema diagnosis.   She is requesting that provider call and speak to Radiologist to determine the best order for appropriate imaging.   Radiologist line is [phone].   Veronda Prude, RN

## 2022-08-11 ENCOUNTER — Encounter: Payer: Self-pay | Admitting: Student

## 2022-08-12 ENCOUNTER — Telehealth (HOSPITAL_COMMUNITY): Payer: Self-pay | Admitting: *Deleted

## 2022-08-12 ENCOUNTER — Other Ambulatory Visit: Payer: Self-pay | Admitting: Student

## 2022-08-12 DIAGNOSIS — K921 Melena: Secondary | ICD-10-CM

## 2022-08-12 DIAGNOSIS — I890 Lymphedema, not elsewhere classified: Secondary | ICD-10-CM | POA: Insufficient documentation

## 2022-08-12 MED ORDER — OMEPRAZOLE 40 MG PO CPDR
40.0000 mg | DELAYED_RELEASE_CAPSULE | Freq: Two times a day (BID) | ORAL | 3 refills | Status: DC
Start: 2022-08-12 — End: 2022-10-27

## 2022-08-12 NOTE — Assessment & Plan Note (Signed)
Most likely 2/2 iron, but cannot r/o upper GI Bleed. Hgb was reassuringly WNL at last check but will repeat. - Has GI follow-up scheduled - Will double up PPI to BID dosing until she sees GI - Continue Iron - Repeat CBC today

## 2022-08-12 NOTE — Assessment & Plan Note (Signed)
Longstanding issue but worsening.  With her history of abnormal brain MRIs, I would favor going ahead and reimaging her.  Unfortunately she has a contrast allergy so this will need to be a noncontrasted study. She has follow-up with a neuromuscular disorders specialist at [facility] but this is not for several months. Imaging may help [place] get her to the right specialists given her frustration about being "passed off" to specialist after specialist. - MRI Brain WO Contrast  - Neuromuscular Disorders Clinic F/u at [facility]

## 2022-08-12 NOTE — Assessment & Plan Note (Addendum)
Likely in the setting of past pelvic surgery, but given her age, ongoing general abdominal pain, and interval onset, would be wise to rule out other obstructive process such as ovarian CA or significant lymphadenopathy. - Pelvic US complete

## 2022-08-12 NOTE — Telephone Encounter (Signed)
Patient called very upset and angry that an error was put in her medical record that has not been corrected. She states she told the MD that she was last inpatient 40 years ago and he put 4 years. She has several appointments with other MD's coming up soon and she does not want them to read that incorrect information. She is asking that it be fixed ASAP. She says she called last week and spoke with a man who told her it would be a month before it could be fixed but she is unwilling to wait that long. She wants someone to call her once the correction is made.

## 2022-08-14 ENCOUNTER — Encounter: Payer: Self-pay | Admitting: Student

## 2022-08-16 ENCOUNTER — Other Ambulatory Visit: Payer: Self-pay | Admitting: Student

## 2022-08-16 ENCOUNTER — Other Ambulatory Visit: Payer: Self-pay | Admitting: Orthopaedic Surgery

## 2022-08-17 ENCOUNTER — Other Ambulatory Visit: Payer: Medicare Other

## 2022-08-18 ENCOUNTER — Ambulatory Visit (HOSPITAL_COMMUNITY)
Admission: RE | Admit: 2022-08-18 | Discharge: 2022-08-18 | Disposition: A | Discharge status: Home / Self Care | Payer: Medicare Other | Source: Ambulatory Visit | Attending: Family Medicine | Admitting: Family Medicine

## 2022-08-18 ENCOUNTER — Ambulatory Visit (HOSPITAL_COMMUNITY): Payer: Medicare Other

## 2022-08-18 DIAGNOSIS — R079 Chest pain, unspecified: Secondary | ICD-10-CM

## 2022-08-18 DIAGNOSIS — R011 Cardiac murmur, unspecified: Secondary | ICD-10-CM | POA: Insufficient documentation

## 2022-08-18 LAB — ECHOCARDIOGRAM COMPLETE
AR max vel: 1.48 cm2
AV Area VTI: 1.49 cm2
AV Area mean vel: 1.36 cm2
AV Mean grad: 6 mmHg
AV Peak grad: 10 mmHg
Ao pk vel: 1.58 m/s
Area-P 1/2: 3.43 cm2
S' Lateral: 2.8 cm

## 2022-08-19 ENCOUNTER — Ambulatory Visit: Payer: Medicare Other

## 2022-08-19 ENCOUNTER — Other Ambulatory Visit (HOSPITAL_COMMUNITY): Payer: Self-pay | Admitting: Student

## 2022-08-21 ENCOUNTER — Other Ambulatory Visit: Payer: Self-pay | Admitting: Student

## 2022-08-23 ENCOUNTER — Ambulatory Visit (HOSPITAL_COMMUNITY): Payer: Medicare Other

## 2022-08-23 ENCOUNTER — Other Ambulatory Visit: Payer: Self-pay | Admitting: Student

## 2022-08-23 DIAGNOSIS — E039 Hypothyroidism, unspecified: Secondary | ICD-10-CM

## 2022-08-23 DIAGNOSIS — R110 Nausea: Secondary | ICD-10-CM

## 2022-08-23 NOTE — Progress Notes (Deleted)
Cardiology Office Note:   Date:  08/23/2022  NAME:  Jodi Gilmore    MRN: [id number] DOB:  [birth year]   PCP:  Alicia Amel, MD  Cardiologist:  None  Electrophysiologist:  None   Referring MD: Alicia Amel, MD   No chief complaint on file.   History of Present Illness:   Jodi Gilmore is a 67 y.o. female with a hx of HTN, HLD, hypothyroidism who is being seen today for the evaluation of chest pain at the request of Alicia Amel, MD.  T chol 272, HDL 71, LDL 175, TG 145  Problem List HTN HLD Coronary calcifications on chest CT  Autoimmune disease/fibromyalgia? 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    Hip pain    HLA B27 (HLA B27 positive)    Hyperlipidemia    Hypertension    Knee pain    Memory changes    Morphea scleroderma    Neuromuscular disorder (HCC)    Raynauds    Pneumonia    Psoriatic arthritis (HCC)    seen in ears only   Raynaud's disease    Sjogren syndrome with dental involvement (HCC)     Past Surgical History: Past Surgical History:  Procedure Laterality Date   CYST REMOVAL HAND     left arm posterior   laproscopy     SALPINGECTOMY  1980   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     Allergies:    Contrast media [iodinated contrast media], Prohance [gadoteridol], Ptu [propylthiouracil], Sulfa antibiotics, Tapazole [methimazole], and Latex   Social History: Social History   Socioeconomic History   Marital status: Single    Spouse name: Not on file   Number of children: Not on file   Years of education: Not on file   Highest education level: Not on file  Occupational History   Occupation: Unemployed  Tobacco Use   Smoking status: Former    Passive exposure: Past   Smokeless tobacco: Never  Vaping Use   Vaping status: Never Used  Substance and Sexual Activity   Alcohol use: Not Currently   Drug use: Never   Sexual activity: Not on file  Other Topics Concern   Not on file  Social History Narrative   Not on file   Social Determinants of Health   Financial Resource Strain: Medium Risk (06/11/2022)   Overall Financial Resource Strain (CARDIA)    Difficulty of Paying Living Expenses:  Somewhat hard  Food Insecurity: No Food Insecurity (06/11/2022)   Hunger Vital Sign    Worried About Running Out of Food in the Last Year: Never true    Ran Out of Food in the Last Year: Never true  Transportation Needs: Unmet Transportation Needs (06/11/2022)   PRAPARE - Transportation    Lack of Transportation (Medical): Yes    Lack of Transportation (Non-Medical): Yes  Physical Activity: Inactive (06/11/2022)   Exercise Vital Sign    Days of Exercise per Week: 0 days    Minutes of Exercise per Session: 0 min  Stress: Stress Concern Present (06/11/2022)   [employer] of Occupational Health - Occupational Stress Questionnaire    Feeling of Stress : Very much  Social Connections: Unknown (08/11/2022)   [facility] from [facility]   Social Network    Social Network: Not on file  Recent Concern: Social Connections - Socially Isolated (06/11/2022)    Social Connection and Isolation Panel [NHANES]    Frequency of Communication with Friends and Family: Once a week    Frequency of Social Gatherings with Friends and Family: Never    Attends Religious Services: Never    Database administrator or Organizations: No    Attends Engineer, structural: Never    Marital Status: Divorced     Family History: The patient's family history includes Allergies in her son; Congestive Heart Failure in her mother; Dementia in her mother and sister; Healthy in her son; Heart attack in her father; Heart disease in her father and mother; Hemachromatosis in her sister; Parkinson's disease in her sister. There is no history of Colon cancer, Colon polyps, Esophageal cancer, Stomach cancer, or Rectal cancer.  ROS:   All other ROS reviewed and negative. Pertinent positives noted in the HPI.     EKGs/Labs/Other Studies Reviewed:   The following studies were personally reviewed by me today:  EKG:  EKG is *** ordered today.        Recent Labs: 11/26/2021: ALT 24 06/01/2022: TSH 3.63 06/29/2022: BUN 23; Creatinine, Ser 1.08; Magnesium 2.4; Potassium 4.5; Sodium 138 08/10/2022: Hemoglobin 12.7; Platelets 317   Recent Lipid Panel    Component Value Date/Time   CHOL 272 (H) 02/12/2020 0942   TRIG 145 02/12/2020 0942   HDL 71 02/12/2020 0942   CHOLHDL 3.8 02/12/2020 0942   LDLCALC 175 (H) 02/12/2020 0942   LDLDIRECT 82 05/15/2020 1434    Physical Exam:   VS:  There were no vitals taken for this visit.   Wt Readings from Last 3 Encounters:  08/10/22 198 lb 12.8 oz (90.2 kg)  06/29/22 193 lb 2 oz (87.6 kg)  06/01/22 193 lb (87.5 kg)    General: Well nourished, well developed, in no acute distress Head: Atraumatic, normal size  Eyes: PEERLA, EOMI  Neck: Supple, no JVD Endocrine: No thryomegaly Cardiac: Normal S1, S2; RRR; no murmurs, rubs, or gallops Lungs: Clear to auscultation bilaterally, no wheezing, rhonchi or rales  Abd: Soft,  nontender, no hepatomegaly  Ext: No edema, pulses 2+ Musculoskeletal: No deformities, BUE and BLE strength normal and equal Skin: Warm and dry, no rashes   Neuro: Alert and oriented to person, place, time, and situation, CNII-XII grossly intact, no focal deficits  Psych: Normal mood and affect   ASSESSMENT:   Jodi Gilmore is a 67 y.o. female who presents for the following: No diagnosis found.  PLAN:   There are no diagnoses linked to this encounter.  {Are you ordering a  CV Procedure (e.g. stress test, cath, DCCV, TEE, etc)?   Press F2        :[id number]}  Disposition: No follow-ups on file.  Medication Adjustments/Labs and Tests Ordered: Current medicines are reviewed at length with the patient today.  Concerns regarding medicines are outlined above.  No orders of the defined types were placed in this encounter.  No orders of the defined types were placed in this encounter.  There are no Patient Instructions on file for this visit.   Time Spent with Patient: I have spent a total of *** minutes with patient reviewing hospital notes, telemetry, EKGs, labs and examining the patient as well as establishing an assessment and plan that was discussed with the patient.  > 50% of time was spent in direct patient care.  Signed, Lenna Gilford. Flora Lipps, MD, [facility]  [facility]  [address] [place], [place] [address] [phone]  08/23/2022 8:00 PM

## 2022-08-25 ENCOUNTER — Ambulatory Visit: Payer: Medicare Other | Admitting: Cardiovascular Disease

## 2022-08-25 ENCOUNTER — Other Ambulatory Visit: Payer: Self-pay | Admitting: Student

## 2022-08-25 DIAGNOSIS — I890 Lymphedema, not elsewhere classified: Secondary | ICD-10-CM

## 2022-08-25 DIAGNOSIS — R072 Precordial pain: Secondary | ICD-10-CM

## 2022-08-25 DIAGNOSIS — R110 Nausea: Secondary | ICD-10-CM

## 2022-08-27 ENCOUNTER — Ambulatory Visit (HOSPITAL_COMMUNITY): Payer: Medicare Other | Admitting: Clinical

## 2022-08-27 DIAGNOSIS — F411 Generalized anxiety disorder: Secondary | ICD-10-CM

## 2022-08-27 NOTE — Progress Notes (Unsigned)
THERAPIST PROGRESS NOTE Virtual Visit via Video Note  I connected with Jodi Gilmore on 08/27/2022 at 11:00 AM EDT by a video enabled telemedicine application and verified that I am speaking with the correct person using two identifiers.  Location: Patient: home Provider: office   I discussed the limitations of evaluation and management by telemedicine and the availability of in person appointments. The patient expressed understanding and agreed to proceed.   Follow Up Instructions: I discussed the assessment and treatment plan with the patient. The patient was provided an opportunity to ask questions and all were answered. The patient agreed with the plan and demonstrated an understanding of the instructions.   The patient was advised to call back or seek an in-person evaluation if the symptoms worsen or if the condition fails to improve as anticipated.   Session Time: 25 minutes  Participation Level: Active  Behavioral Response: CasualAlertAnxious  Type of Therapy: Individual Therapy  Treatment Goals addressed: Patient will practice problem solving skills 3 times per week for the next 4 weeks   ProgressTowards Goals: Progressing  Interventions: CBT and Supportive  Summary:  Jodi Gilmore is a 67 y.o. female who presents for the scheduled appointment oriented x 5, appropriately dressed, and friendly.  Client denied hallucinations and delusions. Client reported on today things have been the same. Client reported she is in some physical discomfort due to having some testing done with her specialist. Client reported she has had an echocardiogram, a pelvic ultrasound, MRI, and some other testing.  Client reported sitting and/or walking for even short period time are difficult for her to do. Client reported when she has her doctors appointments she goes with a care aide to help her get checked in.  Client reported she has dizziness and weakness and is prone to being a fall risk.   Client reported her eyesight is also getting worse.  Client reported her main concern is the scan of her brain as she is hoping that her doctors can discover answers to help her get treatment for her symptoms.  Client reported she was referred to [facility] neurology.  Client reported it does cause her some sadness as she has been contemplating on what to do about her son's wedding that will be coming up in October. Client reported eating and sleeping has been challenging due to having a severe dry mouth. Client reported she is staying optimistic about getting better and staying compliant with doctor recommendations.  Evidence of progress towards goal:  client reported she is keeping 100% of her scheduled medical appointments which she did not do a few years ago.  Suicidal/Homicidal: Nowithout intent/plan  Therapist Response:  Therapist began the appointment asking the client how she has been doing. Therapist used CBT to engage using active listening and positive emotional support. Therapist used CBT to engage and ask the client open ended questions about how her mental health may be affected by her health concerns. Therapist used CBT to normalize the clients emotions and reinforce her use of resources to help her have her psychosocial needs met. Therapist used CBT ask the client to identify her progress with frequency of use with coping skills with continued practice in her daily activity.    Therapist assigned the client homework to practice self care.    Plan: Return again in 4 weeks.  Diagnosis: generalized anxiety disorder  Collaboration of Care: Patient refused AEB none requested by the client.  Patient/Guardian was advised Release of Information must be obtained prior to  any record release in order to collaborate their care with an outside provider. Patient/Guardian was advised if they have not already done so to contact the registration department to sign all necessary forms in order for [place] to  release information regarding their care.   Consent: Patient/Guardian gives verbal consent for treatment and assignment of benefits for services provided during this visit. Patient/Guardian expressed understanding and agreed to proceed.   Neena Rhymes Seanmichael Salmons, LCSW 08/27/2022

## 2022-09-01 ENCOUNTER — Other Ambulatory Visit: Payer: Medicare Other

## 2022-09-02 ENCOUNTER — Encounter: Payer: Self-pay | Admitting: Student

## 2022-09-05 ENCOUNTER — Other Ambulatory Visit: Payer: Self-pay | Admitting: Student

## 2022-09-05 MED ORDER — OFLOXACIN 0.3 % OT SOLN: 5.0000 [drp] | Freq: Every day | OTIC | 0 refills | Status: AC

## 2022-09-07 ENCOUNTER — Telehealth (HOSPITAL_COMMUNITY): Payer: Self-pay | Admitting: *Deleted

## 2022-09-07 NOTE — Telephone Encounter (Signed)
[facility] faxed a request for dextroamp 10 mg , last filled on 08/14/22. She has an appt with Dr Weston Brass on 09/10/22 so rx can be addressed on that date but will forward message to make Dr aware of the request.

## 2022-09-08 ENCOUNTER — Other Ambulatory Visit (HOSPITAL_COMMUNITY): Payer: Self-pay | Admitting: Student

## 2022-09-09 ENCOUNTER — Encounter: Payer: Self-pay | Admitting: Student

## 2022-09-10 ENCOUNTER — Telehealth (HOSPITAL_COMMUNITY): Payer: Medicare Other | Admitting: Student

## 2022-09-10 DIAGNOSIS — F909 Attention-deficit hyperactivity disorder, unspecified type: Secondary | ICD-10-CM

## 2022-09-10 DIAGNOSIS — F3160 Bipolar disorder, current episode mixed, unspecified: Secondary | ICD-10-CM

## 2022-09-10 DIAGNOSIS — F411 Generalized anxiety disorder: Secondary | ICD-10-CM | POA: Diagnosis not present

## 2022-09-10 MED ORDER — QUETIAPINE FUMARATE 400 MG PO TABS
400.0000 mg | ORAL_TABLET | Freq: Every day | ORAL | 1 refills | Status: DC
Start: 2022-09-10 — End: 2022-10-27

## 2022-09-10 MED ORDER — BUSPIRONE HCL 10 MG PO TABS
10.0000 mg | ORAL_TABLET | Freq: Three times a day (TID) | ORAL | 1 refills | Status: DC
Start: 2022-09-10 — End: 2022-10-27

## 2022-09-10 MED ORDER — PAROXETINE HCL 40 MG PO TABS: 40.0000 mg | ORAL_TABLET | Freq: Every day | ORAL | 2 refills | Status: DC

## 2022-09-10 MED ORDER — AMPHETAMINE-DEXTROAMPHETAMINE 10 MG PO TABS
10.0000 mg | ORAL_TABLET | Freq: Two times a day (BID) | ORAL | 0 refills | Status: DC
Start: 2022-09-10 — End: 2022-12-03

## 2022-09-10 MED ORDER — AMPHETAMINE-DEXTROAMPHETAMINE 10 MG PO TABS
10.0000 mg | ORAL_TABLET | Freq: Two times a day (BID) | ORAL | 0 refills | Status: DC
Start: 2022-11-06 — End: 2022-12-03

## 2022-09-10 MED ORDER — AMPHETAMINE-DEXTROAMPHETAMINE 10 MG PO TABS
10.0000 mg | ORAL_TABLET | Freq: Two times a day (BID) | ORAL | 0 refills | Status: DC
Start: 2022-10-09 — End: 2022-12-03

## 2022-09-10 NOTE — Progress Notes (Signed)
BH MD/PA/NP OP Progress Note  Virtual Visit via Video Note  I connected with Jodi Gilmore on 09/10/22 at  9:00 AM EDT by a video enabled telemedicine application and verified that I am speaking with the correct person using two identifiers.  Location: Patient: Home Provider: [facility]   I discussed the limitations of evaluation and management by telemedicine and the availability of in person appointments. The patient expressed understanding and agreed to proceed.   09/10/2022 2:45 PM Crystalle Leiss  MRN:  [id number]  Chief Complaint:  Chief Complaint  Patient presents with   Follow-up   HPI:  Jodi Gilmore is a 66 yr old female with a history of bipolar affective disorder and ADHD, with previous psychiatric admissions, last one was 40 years ago in [place] for severe depression.  The patient denies prior suicide attempts.  She has been in the clinic for over a year, first with Karel Jarvis, then Dr. Arna Snipe, and now myself.  Today she reports difficulties with chronic medical conditions, but states that her psychiatric symptomatology is stable and perhaps slightly improved with the increase of Paxil from 30 mg to 40 mg.   The patient reports difficulties with back pain, which make walking especially difficult.  She states that she is getting a workup for myasthenia gravis at [facility] and has gotten an MRI for other medical issues.  The patient states that her mood has been "okay".  She denies experiencing suicidal thoughts.  She is clearly future oriented and says that she is looking forward to her son's wedding.  Visit Diagnosis:    ICD-10-CM   1. Attention deficit hyperactivity disorder (ADHD), unspecified ADHD type  F90.9 amphetamine-dextroamphetamine (ADDERALL) 10 MG tablet    amphetamine-dextroamphetamine (ADDERALL) 10 MG tablet    amphetamine-dextroamphetamine (ADDERALL) 10 MG tablet    2. Bipolar affective disorder, current episode mixed, current episode severity  unspecified (HCC)  F31.60 QUEtiapine (SEROQUEL) 400 MG tablet    PARoxetine (PAXIL) 40 MG tablet    3. Generalized anxiety disorder  F41.1 PARoxetine (PAXIL) 40 MG tablet    busPIRone (BUSPAR) 10 MG tablet       Past Psychiatric History: Bipolar Disorder,  GAD, and ADHD.   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    Hip pain    HLA B27 (HLA B27 positive)    Hyperlipidemia    Hypertension    Knee pain    Memory changes    Morphea scleroderma    Neuromuscular disorder (HCC)    Raynauds    Pneumonia    Psoriatic arthritis (HCC)    seen in ears only   Raynaud's disease    Sjogren syndrome with dental involvement (HCC)     Past Surgical History:  Procedure Laterality Date   CYST  REMOVAL HAND     left arm posterior   laproscopy     SALPINGECTOMY  1980   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   Procedure: LEFT TOTAL HIP ARTHROPLASTY ANTERIOR APPROACH;  Surgeon: Kathryne Hitch, MD;  Location: [facility];  Service: Orthopedics;  Laterality: Left;   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      Family Psychiatric History: Nephew - Schizophrenia Father - patient suspects that her father may be bipolar   Family History:  Family History  Problem Relation Age of Onset   Heart disease Mother    Congestive Heart Failure Mother    Dementia Mother    Heart disease Father    Heart attack Father    Dementia Sister    Hemachromatosis Sister    Parkinson's disease Sister     Allergies Son    Healthy Son    Colon cancer Neg Hx    Colon polyps Neg Hx    Esophageal cancer Neg Hx    Stomach cancer Neg Hx    Rectal cancer Neg Hx     Social History:  Social History   Socioeconomic History   Marital status: Single    Spouse name: Not on file   Number of children: Not on file   Years of education: Not on file   Highest education level: Not on file  Occupational History   Occupation: Unemployed  Tobacco Use   Smoking status: Former    Passive exposure: Past   Smokeless tobacco: Never  Vaping Use   Vaping status: Never Used  Substance and Sexual Activity   Alcohol use: Not Currently   Drug use: Never   Sexual activity: Not on file  Other Topics Concern   Not on file  Social History Narrative   Not on file   Social Determinants of Health   Financial Resource Strain: Medium Risk (06/11/2022)   Overall Financial Resource Strain (CARDIA)    Difficulty of Paying Living Expenses: Somewhat hard  Food Insecurity: No Food Insecurity (06/11/2022)   Hunger Vital Sign    Worried About Running Out of Food in the Last Year: Never true    Ran Out of Food in the Last Year: Never true  Transportation Needs: Unmet Transportation Needs (06/11/2022)   PRAPARE - Transportation    Lack of Transportation (Medical): Yes    Lack of Transportation (Non-Medical): Yes  Physical Activity: Inactive (06/11/2022)   Exercise Vital Sign    Days of Exercise per Week: 0 days    Minutes of Exercise per Session: 0 min  Stress: Stress Concern Present (06/11/2022)   [employer] of Occupational Health - Occupational Stress Questionnaire    Feeling of Stress : Very much  Social Connections: Unknown (08/11/2022)   Received from [facility]   Social Network    Social Network: Not on file  Recent Concern: Social Connections - Socially Isolated (06/11/2022)   Social Connection and Isolation Panel [NHANES]    Frequency of Communication with Friends and Family: Once a week     Frequency of Social Gatherings with Friends and Family: Never    Attends Religious Services: Never    Database administrator or Organizations: No    Attends Banker Meetings: Never    Marital Status: Divorced    Allergies:  Allergies  Allergen Reactions   Contrast Media [Iodinated Contrast Media] Anaphylaxis   Prohance [Gadoteridol] Anaphylaxis   Ptu [Propylthiouracil]    Sulfa Antibiotics     Unknown reaction, family history  Tapazole [Methimazole]    Latex Hives and Rash    Metabolic Disorder Labs: Lab Results  Component Value Date   HGBA1C 5.4 11/26/2021   No results found for: "PROLACTIN" Lab Results  Component Value Date   CHOL 272 (H) 02/12/2020   TRIG 145 02/12/2020   HDL 71 02/12/2020   CHOLHDL 3.8 02/12/2020   LDLCALC 175 (H) 02/12/2020   Lab Results  Component Value Date   TSH 3.63 06/01/2022   TSH 7.500 (H) 11/26/2021   TSH CANCELED 11/26/2021    Therapeutic Level Labs: No results found for: "LITHIUM" No results found for: "VALPROATE" No results found for: "CBMZ"  Current Medications: Current Outpatient Medications  Medication Sig Dispense Refill   ofloxacin (FLOXIN) 0.3 % OTIC solution Place 5 drops into the right ear daily for 14 days. 10 mL 0   acetaminophen (TYLENOL) 325 MG tablet Take 650 mg by mouth every 6 (six) hours as needed for moderate pain.     amphetamine-dextroamphetamine (ADDERALL) 10 MG tablet Take 1 tablet (10 mg total) by mouth 2 (two) times daily with a meal. 60 tablet 0   [START ON 10/09/2022] amphetamine-dextroamphetamine (ADDERALL) 10 MG tablet Take 1 tablet (10 mg total) by mouth 2 (two) times daily with a meal. 60 tablet 0   [START ON 11/06/2022] amphetamine-dextroamphetamine (ADDERALL) 10 MG tablet Take 1 tablet (10 mg total) by mouth 2 (two) times daily with a meal. 60 tablet 0   aspirin-acetaminophen-caffeine (EXCEDRIN MIGRAINE) 250-250-65 MG tablet Take 2 tablets by mouth every 6 (six) hours as needed for  headache.     atorvastatin (LIPITOR) 40 MG tablet TAKE ONE TABLET BY MOUTH ONE TIME DAILY 90 tablet 0   busPIRone (BUSPAR) 10 MG tablet Take 1 tablet (10 mg total) by mouth 3 (three) times daily. 90 tablet 1   cetirizine (ZYRTEC ALLERGY) 10 MG tablet Take 1 tablet (10 mg total) by mouth daily. 90 tablet 1   FEROSUL 325 (65 Fe) MG tablet TAKE ONE TABLET BY MOUTH EVERY OTHER DAY 90 tablet 1   fluticasone (FLONASE) 50 MCG/ACT nasal spray USE TWO SPRAYS IN EACH NOSTRIL ONE TIME DAILY 16 mL 6   levothyroxine (SYNTHROID) 112 MCG tablet TAKE ONE TABLET BY MOUTH EVERY MORNING 30 MINUTES BEFORE FOOD 90 tablet 3   lisinopril (ZESTRIL) 2.5 MG tablet TAKE ONE TABLET BY MOUTH AT BEDTIME 90 tablet 1   methocarbamol (ROBAXIN) 500 MG tablet TAKE ONE TABLET BY MOUTH EVERY 6 HOURS AS NEEDED FOR MUSCLE SPASM 40 tablet 1   omeprazole (PRILOSEC) 40 MG capsule Take 1 capsule (40 mg total) by mouth in the morning and at bedtime. 180 capsule 3   ondansetron (ZOFRAN) 4 MG tablet TAKE ONE TABLET BY MOUTH EVERY 8 HOURS AS NEEDED FOR NAUSEA OR VOMITING 20 tablet 0   PARoxetine (PAXIL) 40 MG tablet Take 1 tablet (40 mg total) by mouth daily. 30 tablet 2   QUEtiapine (SEROQUEL) 400 MG tablet Take 1 tablet (400 mg total) by mouth at bedtime. 30 tablet 1   RESTASIS 0.05 % ophthalmic emulsion INSTILL ONE DROP INTO EACH EYE TWICE DAILY 60 each 3   rizatriptan (MAXALT) 10 MG tablet TAKE 1 TABLET BY MOUTH AT ONSET OF HEADACHE. MAY REPEAT DOSE WITH ONE TABLET IN TWO HOURS IF NEEDED. DO NOT EXCEED TWO TABLETS IN 24 HOURS. 30 tablet 0   traMADol (ULTRAM) 50 MG tablet Take 50 mg by mouth every 6 (six) hours as needed.     valACYclovir (VALTREX) 500  MG tablet TAKE ONE TABLET BY MOUTH TWICE A DAY FOR 7 DAYS 30 tablet 2   No current facility-administered medications for this visit.     Psychiatric Specialty Exam: Physical Exam Constitutional:      Appearance: the patient is not toxic-appearing.  Pulmonary:     Effort: Pulmonary  effort is normal.  Neurological:     General: No focal deficit present.     Mental Status: the patient is alert and oriented to person, place, and time.   Review of Systems  Respiratory:  Negative for shortness of breath.   Cardiovascular:  Negative for chest pain.  Gastrointestinal:  Negative for abdominal pain, constipation, diarrhea, nausea and vomiting.  Neurological:  Negative for headaches.      There were no vitals taken for this visit.  General Appearance: Fairly Groomed  Eye Contact:  Good  Speech:  Clear and Coherent  Volume:  Normal  Mood:  "okay"  Affect:  somewhat depressed  Thought Process:  Coherent  Orientation:  Full (Time, Place, and Person)  Thought Content: Logical   Suicidal Thoughts:  No  Homicidal Thoughts:  No  Memory:  Immediate;   Good  Judgement:  fair  Insight:  fair  Psychomotor Activity:  Normal  Concentration:  Concentration: Good  Recall:  Good  Fund of Knowledge: Good  Language: Good  Akathisia:  No  Handed:  not assessed  AIMS (if indicated): not done  Assets:  Communication Skills Desire for Improvement Financial Resources/Insurance Housing Leisure Time Physical Health  ADL's:  Intact  Cognition: WNL  Sleep:  Fair     Screenings: GAD-7    Flowsheet Row Video Visit from 08/26/2021 in [facility] Video Visit from 06/24/2021 in [facility] Video Visit from 04/22/2021 in [facility] Video Visit from 03/20/2021 in [facility] Video Visit from 02/19/2021 in [facility]  Total GAD-7 Score 9 10 14 13 11       PHQ2-9    Flowsheet Row Office Visit from 08/10/2022 in [facility] Office Visit from 06/29/2022 in [facility] Clinical Support from 03/09/2022 in [facility] Office Visit from 11/26/2021 in [facility]  [facility] Visit [facility] 08/26/20[facility] in [facility]  PHQ-2 Total Score 2 6 2 4 6   PHQ-9 Total Score 15 19 15 13 18       Flowsheet Row Video Visit from 08/26/2021 in [facility] Video Visit from 06/24/2021 in [facility] Counselor from 05/05/2021 in [facility]  C-SSRS RISK CATEGORY Low Risk Low Risk Error: Question 6 not populated        Assessment and Plan:   Bipolar Disorder, Current Episode Mixed: -Continue Seroquel 400 mg QHS.  30 tablets with 1 refill. - EKG June 2024 QTc 413 - A1c of 5.6, no recent lipid panel - Continue therapy with [facility] - Information given for [facility]     GAD: -Continue Paxil 40 mg daily -Continue Buspar 10 mg TID     ADHD: -Continue Adderall 10 mg BID      Collaboration of Care:   Patient/Guardian was advised Release of Information must be obtained prior to any record release in order to collaborate their care with an outside provider. Patient/Guardian was advised if they have not already done so to contact the registration  department to sign all necessary forms in order for [place] to release information regarding their care.   Consent: Patient/Guardian gives verbal consent for treatment and assignment of benefits for services provided during this visit. Patient/Guardian expressed understanding and agreed to proceed.    Carlyn Reichert, MD 09/10/2022, 2:45 PM   Follow Up Instructions:    I discussed the assessment and treatment plan with the patient. The patient was provided an opportunity to ask questions and all were answered. The patient agreed with the plan and demonstrated an understanding of the instructions.   The patient was advised to call back or seek an in-person evaluation if the symptoms worsen or if the condition fails to improve as anticipated.  I provided 25 minutes of non-face-to-face time during this encounter.

## 2022-09-13 ENCOUNTER — Encounter: Payer: Self-pay | Admitting: Student

## 2022-09-16 ENCOUNTER — Other Ambulatory Visit: Payer: Self-pay | Admitting: Student

## 2022-09-24 ENCOUNTER — Encounter (HOSPITAL_COMMUNITY): Payer: Self-pay

## 2022-09-24 ENCOUNTER — Ambulatory Visit (HOSPITAL_COMMUNITY): Payer: Medicare Other | Admitting: Clinical

## 2022-09-24 ENCOUNTER — Other Ambulatory Visit: Payer: Self-pay | Admitting: Orthopaedic Surgery

## 2022-09-24 ENCOUNTER — Other Ambulatory Visit: Payer: Self-pay | Admitting: Student

## 2022-09-24 DIAGNOSIS — R110 Nausea: Secondary | ICD-10-CM

## 2022-09-29 ENCOUNTER — Encounter: Payer: Self-pay | Admitting: Student

## 2022-10-07 ENCOUNTER — Encounter: Payer: Self-pay | Admitting: Student

## 2022-10-07 ENCOUNTER — Other Ambulatory Visit: Payer: Self-pay | Admitting: Student

## 2022-10-07 DIAGNOSIS — R110 Nausea: Secondary | ICD-10-CM

## 2022-10-08 ENCOUNTER — Other Ambulatory Visit: Payer: Self-pay | Admitting: *Deleted

## 2022-10-11 MED ORDER — VALACYCLOVIR HCL 500 MG PO TABS: ORAL_TABLET | ORAL | 2 refills | Status: DC

## 2022-10-12 ENCOUNTER — Other Ambulatory Visit: Payer: Self-pay | Admitting: Student

## 2022-10-19 ENCOUNTER — Ambulatory Visit: Payer: Medicare Other | Admitting: Student

## 2022-10-21 ENCOUNTER — Ambulatory Visit: Payer: Medicare Other | Admitting: Cardiovascular Disease

## 2022-10-24 ENCOUNTER — Other Ambulatory Visit: Payer: Self-pay | Admitting: Orthopaedic Surgery

## 2022-10-24 ENCOUNTER — Other Ambulatory Visit: Payer: Self-pay | Admitting: Student

## 2022-10-24 DIAGNOSIS — R110 Nausea: Secondary | ICD-10-CM

## 2022-10-27 ENCOUNTER — Ambulatory Visit: Payer: Medicare Other | Admitting: Gastroenterology

## 2022-10-27 ENCOUNTER — Other Ambulatory Visit: Payer: Self-pay | Admitting: *Deleted

## 2022-10-27 ENCOUNTER — Telehealth (HOSPITAL_COMMUNITY): Payer: Self-pay | Admitting: *Deleted

## 2022-10-27 ENCOUNTER — Other Ambulatory Visit (HOSPITAL_COMMUNITY): Payer: Self-pay | Admitting: Student

## 2022-10-27 DIAGNOSIS — F411 Generalized anxiety disorder: Secondary | ICD-10-CM

## 2022-10-27 DIAGNOSIS — F3160 Bipolar disorder, current episode mixed, unspecified: Secondary | ICD-10-CM

## 2022-10-27 DIAGNOSIS — K921 Melena: Secondary | ICD-10-CM

## 2022-10-27 MED ORDER — BUSPIRONE HCL 10 MG PO TABS
10.0000 mg | ORAL_TABLET | Freq: Three times a day (TID) | ORAL | 1 refills | Status: DC
Start: 2022-10-27 — End: 2022-12-03

## 2022-10-27 MED ORDER — PAROXETINE HCL 40 MG PO TABS: 40.0000 mg | ORAL_TABLET | Freq: Every day | ORAL | 2 refills | Status: DC

## 2022-10-27 MED ORDER — QUETIAPINE FUMARATE 400 MG PO TABS
400.0000 mg | ORAL_TABLET | Freq: Every day | ORAL | 1 refills | Status: DC
Start: 2022-10-27 — End: 2022-11-19

## 2022-10-27 MED ORDER — OMEPRAZOLE 40 MG PO CPDR
40.0000 mg | DELAYED_RELEASE_CAPSULE | Freq: Two times a day (BID) | ORAL | 3 refills | Status: DC
Start: 2022-10-27 — End: 2023-11-21

## 2022-10-27 NOTE — Telephone Encounter (Addendum)
Faxed request from pharmacy for refill of Buspirone 10mg  and Paroxetine 30mg . Paroxetine should have enough until Next appointment in Nov.

## 2022-11-09 ENCOUNTER — Ambulatory Visit (INDEPENDENT_AMBULATORY_CARE_PROVIDER_SITE_OTHER): Payer: Medicare Other | Admitting: Student

## 2022-11-09 ENCOUNTER — Encounter: Payer: Self-pay | Admitting: Student

## 2022-11-09 VITALS — BP 122/75 | HR 96 | Ht 64.0 in | Wt 196.0 lb

## 2022-11-09 DIAGNOSIS — R5383 Other fatigue: Secondary | ICD-10-CM | POA: Diagnosis not present

## 2022-11-09 DIAGNOSIS — R42 Dizziness and giddiness: Secondary | ICD-10-CM

## 2022-11-09 DIAGNOSIS — R7982 Elevated C-reactive protein (CRP): Secondary | ICD-10-CM

## 2022-11-09 DIAGNOSIS — M810 Age-related osteoporosis without current pathological fracture: Secondary | ICD-10-CM | POA: Diagnosis not present

## 2022-11-09 DIAGNOSIS — K760 Fatty (change of) liver, not elsewhere classified: Secondary | ICD-10-CM

## 2022-11-09 DIAGNOSIS — E039 Hypothyroidism, unspecified: Secondary | ICD-10-CM

## 2022-11-09 DIAGNOSIS — Z0000 Encounter for general adult medical examination without abnormal findings: Secondary | ICD-10-CM | POA: Diagnosis not present

## 2022-11-09 DIAGNOSIS — G43109 Migraine with aura, not intractable, without status migrainosus: Secondary | ICD-10-CM

## 2022-11-09 DIAGNOSIS — H9203 Otalgia, bilateral: Secondary | ICD-10-CM

## 2022-11-09 DIAGNOSIS — E611 Iron deficiency: Secondary | ICD-10-CM | POA: Diagnosis not present

## 2022-11-09 DIAGNOSIS — I10 Essential (primary) hypertension: Secondary | ICD-10-CM

## 2022-11-09 DIAGNOSIS — R1313 Dysphagia, pharyngeal phase: Secondary | ICD-10-CM | POA: Diagnosis not present

## 2022-11-09 MED ORDER — RIZATRIPTAN BENZOATE 10 MG PO TBDP
10.0000 mg | ORAL_TABLET | ORAL | 0 refills | Status: DC | PRN
Start: 2022-11-09 — End: 2022-12-24

## 2022-11-09 NOTE — Patient Instructions (Addendum)
I have ordered your mammogram and a bone density scan. This will be done at the [facility] [place], right across the street from our clinic. You will need to call to make this appointment. Their number is [phone].  The address is [address], [place], [place] [address]  For questions about the MRI and referrals, please call your neurologist's office.   Let's check your thyroid today. I am also going to check a CMP and a CBC.    NOTHING IN YOUR EARS. ENT will call you.   Eliezer Mccoy, MD

## 2022-11-09 NOTE — Progress Notes (Signed)
SUBJECTIVE:   CHIEF COMPLAINT / HPI:   Disequilibrium Ongoing. Seems to be perhaps a bit progressive. She was recently seen by Dr. Marvis Moeller at [facility] for the same. 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 He discussed treatment of this with CBT and placed a referral. He also ordered an MR C-spine/T-spine/L-spine to assess for any worsening spinal disease that could be contributing. 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 Currently having epidural steroid injections with Dr. Aileen Fass at [facility].   Difficulty Swallowing Ongoing issue. Dysphagia with solids>liquids.  Really favors soft foods.  Cannot even eat the crust on bread.  Previously had swallow study which was grossly normal.  I had previously referred her to GI but unfortunately she has not seen them yet.  She was supposed to see them today but opted to come to this appointment instead.  In discussing with her further it sounds like most of her pain is really more supraglottic.  Hypothyroidism  ADHD Tells me she feels "hyper as hell." Also seems more anxious to me today compared to her baseline.   R Ear Pain  Bleeding Tells me that she has chronic bilateral ear pain R>L that seems worse over the past several days.  She does clean her ears but tells me that she does not stick anything "too far in there."  Healthcare Maintenance Due for Mammo and Dexa. Mammo has been an active order since Nov of last year.    OBJECTIVE:   BP 122/75   Pulse 96   Ht 5\' 4"  (1.626 m)   Wt 196 lb (88.9 kg)   SpO2 94%   BMI 33.64 kg/m   Gen: Anxious HENT: R ear with excoriation to the inferior canal, TM has some clouding to it, but is otherwise intact and unremarkable. L TM and canal are normal. MMM, oropharynx is clear and without exudate, erythema, or tonsilar hypertrophy.  Cardio: RRR, no murmur Pulm: normal WOB on RA, lungs are clear  throughout Extremities: Wearing compression stockings, these were not removed Neuro: Unsteady gait, at baseline based on my prior assessments. Speech is fluent and coherent.  Normal muscle tone and bulk with symmetric strength throughout.  ASSESSMENT/PLAN:   Dysphagia Myasthenia gravis workup has been negative to this point.  Also with grossly normal swallow study.  Talking with her today, sounds like she is having some supraglottic pain though her exam is benign for me.  Given this, taken together with her ear pain, she does request referral to ENT.  I do think having ENT evaluate her for her dysphagia/chronic throat pain is a reasonable next step.  Thankfully, despite significant dysphagia, her weight has remained stable. -Ambulatory referral to ENT  Disequilibrium Thankfully, her neurological assessment has been without gross abnormality.  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  She also has an order in for physical therapy from their office. -I think physical therapy will be key in avoiding future falls and further damage -Follow-up results of MR -CBT for functional neurological disorder -Advised to follow-up with neurologist for concern of progressive disease  Hypothyroidism (acquired) Overtreating of her hypothyroidism could contribute to her feeling "  hyper cell."  Over- or undertreating could contribute to her disequilibrium as well. - TSH today  Otalgia of both ears Obvious excoriation to the bottom of her right canal which is almost certainly the source of her bleeding.  It does not appear to be cancerous, I have a high suspicion that this is just a mechanical excoriation from her cleaning her ears.  Advised to not put  anything in her ear at all.  However, given chronic otalgia taken together with the clouding of her membrane seen on exam, and her request for ENT evaluation, I think it is more than reasonable to have her evaluated by a specialist. -Ambulatory referral to ENT  Healthcare maintenance Advised to call the breast center to schedule her mammogram and DEXA  Elevated C-reactive protein (CRP) Given the possibility of some chronic inflammatory process contributing here, I think following this up is reasonable. - CRP  Hepatic steatosis Noted on recent CT Abd/Pelv. - CMP     J Dorothyann Gibbs, MD [facility]

## 2022-11-10 ENCOUNTER — Encounter: Payer: Self-pay | Admitting: Student

## 2022-11-10 ENCOUNTER — Other Ambulatory Visit: Payer: Self-pay | Admitting: Student

## 2022-11-10 DIAGNOSIS — Z1231 Encounter for screening mammogram for malignant neoplasm of breast: Secondary | ICD-10-CM

## 2022-11-10 DIAGNOSIS — E039 Hypothyroidism, unspecified: Secondary | ICD-10-CM

## 2022-11-10 LAB — CBC
Hematocrit: 38.7 % (ref 34.0–46.6)
Hemoglobin: 13.1 g/dL (ref 11.1–15.9)
MCH: 32 pg (ref 26.6–33.0)
MCHC: 33.9 g/dL (ref 31.5–35.7)
MCV: 95 fL (ref 79–97)
Platelets: 291 10*3/uL (ref 150–450)
RBC: 4.09 x10E6/uL (ref 3.77–5.28)
RDW: 14.4 % (ref 11.7–15.4)
WBC: 10.2 10*3/uL (ref 3.4–10.8)

## 2022-11-10 LAB — C-REACTIVE PROTEIN: CRP: 6 mg/L (ref 0–10)

## 2022-11-10 LAB — COMPREHENSIVE METABOLIC PANEL
ALT: 35 [IU]/L — ABNORMAL HIGH (ref 0–32)
AST: 28 [IU]/L (ref 0–40)
Albumin: 4.3 g/dL (ref 3.9–4.9)
Alkaline Phosphatase: 157 [IU]/L — ABNORMAL HIGH (ref 44–121)
BUN/Creatinine Ratio: 22 (ref 12–28)
BUN: 22 mg/dL (ref 8–27)
Bilirubin Total: 0.4 mg/dL (ref 0.0–1.2)
CO2: 19 mmol/L — ABNORMAL LOW (ref 20–29)
Calcium: 9.6 mg/dL (ref 8.7–10.3)
Chloride: 103 mmol/L (ref 96–106)
Creatinine, Ser: 1.01 mg/dL — ABNORMAL HIGH (ref 0.57–1.00)
Globulin, Total: 2.6 g/dL (ref 1.5–4.5)
Glucose: 118 mg/dL — ABNORMAL HIGH (ref 70–99)
Potassium: 3.8 mmol/L (ref 3.5–5.2)
Sodium: 139 mmol/L (ref 134–144)
Total Protein: 6.9 g/dL (ref 6.0–8.5)
eGFR: 61 mL/min/{1.73_m2} (ref >59)

## 2022-11-10 LAB — TSH: TSH: 6.41 u[IU]/mL — ABNORMAL HIGH (ref 0.450–4.500)

## 2022-11-11 ENCOUNTER — Encounter: Payer: Self-pay | Admitting: Student

## 2022-11-11 DIAGNOSIS — H9203 Otalgia, bilateral: Secondary | ICD-10-CM | POA: Insufficient documentation

## 2022-11-11 DIAGNOSIS — K760 Fatty (change of) liver, not elsewhere classified: Secondary | ICD-10-CM | POA: Insufficient documentation

## 2022-11-11 MED ORDER — LEVOTHYROXINE SODIUM 125 MCG PO TABS: 125.0000 ug | ORAL_TABLET | Freq: Every day | ORAL | 5 refills | Status: DC

## 2022-11-11 NOTE — Assessment & Plan Note (Signed)
Advised to call the breast center to schedule her mammogram and DEXA

## 2022-11-11 NOTE — Assessment & Plan Note (Signed)
Thankfully, her neurological assessment has been without gross abnormality.  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  She also has an order in for physical therapy from their office. -I think physical therapy will be key in avoiding future falls and further damage -Follow-up results of MR -CBT for functional neurological disorder -Advised to follow-up with neurologist for concern of progressive disease

## 2022-11-11 NOTE — Assessment & Plan Note (Signed)
Obvious excoriation to the bottom of her right canal which is almost certainly the source of her bleeding.  It does not appear to be cancerous, I have a high suspicion that this is just a mechanical excoriation from her cleaning her ears.  Advised to not put anything in her ear at all.  However, given chronic otalgia taken together with the clouding of her membrane seen on exam, and her request for ENT evaluation, I think it is more than reasonable to have her evaluated by a specialist. -Ambulatory referral to ENT

## 2022-11-11 NOTE — Assessment & Plan Note (Signed)
Noted on recent CT Abd/Pelv. - CMP

## 2022-11-11 NOTE — Assessment & Plan Note (Addendum)
Overtreating of her hypothyroidism could contribute to her feeling "hyper cell."  Over- or undertreating could contribute to her disequilibrium as well. - TSH today

## 2022-11-11 NOTE — Assessment & Plan Note (Signed)
Myasthenia gravis workup has been negative to this point.  Also with grossly normal swallow study.  Talking with her today, sounds like she is having some supraglottic pain though her exam is benign for me.  Given this, taken together with her ear pain, she does request referral to ENT.  I do think having ENT evaluate her for her dysphagia/chronic throat pain is a reasonable next step.  Thankfully, despite significant dysphagia, her weight has remained stable. -Ambulatory referral to ENT

## 2022-11-11 NOTE — Assessment & Plan Note (Signed)
Given the possibility of some chronic inflammatory process contributing here, I think following this up is reasonable. - CRP

## 2022-11-17 ENCOUNTER — Encounter: Payer: Self-pay | Admitting: Student

## 2022-11-18 ENCOUNTER — Telehealth (HOSPITAL_COMMUNITY): Payer: Self-pay | Admitting: *Deleted

## 2022-11-18 NOTE — Telephone Encounter (Signed)
VM left for nurse to have her provider give her 90 day supply of her generic Paxil and Seroquel. She states its more affordable with 3 month supply. I will forward this request to Carlyn Reichert MD.

## 2022-11-19 ENCOUNTER — Other Ambulatory Visit (HOSPITAL_COMMUNITY): Payer: Self-pay | Admitting: Student

## 2022-11-19 DIAGNOSIS — F3160 Bipolar disorder, current episode mixed, unspecified: Secondary | ICD-10-CM

## 2022-11-19 DIAGNOSIS — F411 Generalized anxiety disorder: Secondary | ICD-10-CM

## 2022-11-19 MED ORDER — PAROXETINE HCL 40 MG PO TABS
40.0000 mg | ORAL_TABLET | Freq: Every day | ORAL | 1 refills | Status: DC
Start: 2022-11-19 — End: 2022-12-03

## 2022-11-19 MED ORDER — QUETIAPINE FUMARATE 400 MG PO TABS: 400.0000 mg | ORAL_TABLET | Freq: Every day | ORAL | 1 refills | Status: DC

## 2022-11-23 ENCOUNTER — Ambulatory Visit: Payer: Medicare Other | Admitting: Student

## 2022-11-26 ENCOUNTER — Telehealth: Payer: Self-pay | Admitting: Student

## 2022-11-26 NOTE — Telephone Encounter (Signed)
Patient called to reschedule appointment that she missed she wanted to let you know she is so sorry she would not have missed it if it wasn't for her migraine causing her to vomit and be sick. She is reschedule with you on 12/27/2022 at 1:30PM.   Thanks!

## 2022-12-01 ENCOUNTER — Ambulatory Visit: Payer: Medicare Other | Admitting: Cardiovascular Disease

## 2022-12-02 ENCOUNTER — Other Ambulatory Visit: Payer: Self-pay | Admitting: Orthopaedic Surgery

## 2022-12-03 ENCOUNTER — Telehealth (INDEPENDENT_AMBULATORY_CARE_PROVIDER_SITE_OTHER): Payer: Medicare Other | Admitting: Student

## 2022-12-03 DIAGNOSIS — F411 Generalized anxiety disorder: Secondary | ICD-10-CM

## 2022-12-03 DIAGNOSIS — F909 Attention-deficit hyperactivity disorder, unspecified type: Secondary | ICD-10-CM | POA: Diagnosis not present

## 2022-12-03 DIAGNOSIS — F3160 Bipolar disorder, current episode mixed, unspecified: Secondary | ICD-10-CM | POA: Diagnosis not present

## 2022-12-03 MED ORDER — BUSPIRONE HCL 10 MG PO TABS: 10.0000 mg | ORAL_TABLET | Freq: Three times a day (TID) | ORAL | 1 refills | Status: DC

## 2022-12-03 MED ORDER — AMPHETAMINE-DEXTROAMPHETAMINE 10 MG PO TABS: 10.0000 mg | ORAL_TABLET | Freq: Two times a day (BID) | ORAL | 0 refills | Status: DC

## 2022-12-03 MED ORDER — QUETIAPINE FUMARATE 400 MG PO TABS: 400.0000 mg | ORAL_TABLET | Freq: Every day | ORAL | 1 refills | Status: DC

## 2022-12-03 MED ORDER — PAROXETINE HCL 40 MG PO TABS
40.0000 mg | ORAL_TABLET | Freq: Every day | ORAL | 1 refills | Status: DC
Start: 2022-12-03 — End: 2023-02-25

## 2022-12-03 NOTE — Progress Notes (Unsigned)
BH MD/PA/NP OP Progress Note  Televisit via video: I connected with Jodi Gilmore on 12/03/2022 at 11:00 AM EST by a video enabled telemedicine application and verified that I am speaking with the correct person using two identifiers.  Location: Patient: Home Provider: Office   I discussed the limitations of evaluation and management by telemedicine and the availability of in person appointments. The patient expressed understanding and agreed to proceed.  I discussed the assessment and treatment plan with the patient. The patient was provided an opportunity to ask questions and all were answered. The patient agreed with the plan and demonstrated an understanding of the instructions.   The patient was advised to call back or seek an in-person evaluation if the symptoms worsen or if the condition fails to improve as anticipated.   12/05/2022 3:33 PM [name]  MRN:  [id number]  Chief Complaint:  Chief Complaint  Patient presents with   Follow-up   HPI:  Jodi "[name]" Gilmore is a 67 yr old female with a history of bipolar affective disorder and ADHD, with previous psychiatric admissions, last one was 40 years ago in [place] for severe depression.  The patient denies prior suicide attempts.  She has been in the clinic for over a year, first with Karel Jarvis, then Dr. Arna Snipe, and now myself.    Today the patient reports that she is doing fairly well.  She has had noted positive developments such as talking on an increasing basis with her sister's family.  She finds this to be very enjoyable.  She feels like she has wisdom to pass on.  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  She feels that this diagnosis may be somewhat helpful.  The patient also reports that she went to her son's wedding and found this somewhat enjoyable.  She states that she is quite worried about finances.  The patient  reports a mood that is "okay".  Her affect is appropriate and she displays positive emotion.  She reports that her sleep is troubled, often related to her lack of physical activity during the day.  She is grateful that she has a home health aide.  She denies experiencing any hopelessness or suicidal thoughts.   Visit Diagnosis:    ICD-10-CM   1. Bipolar affective disorder, current episode mixed, current episode severity unspecified (HCC)  F31.60 PARoxetine (PAXIL) 40 MG tablet    QUEtiapine (SEROQUEL) 400 MG tablet    2. Generalized anxiety disorder  F41.1 PARoxetine (PAXIL) 40 MG tablet    busPIRone (BUSPAR) 10 MG tablet    3. Attention deficit hyperactivity disorder (ADHD), unspecified ADHD type  F90.9 amphetamine-dextroamphetamine (ADDERALL) 10 MG tablet    amphetamine-dextroamphetamine (ADDERALL) 10 MG tablet    amphetamine-dextroamphetamine (ADDERALL) 10 MG tablet       Past Psychiatric History: Bipolar Disorder,  GAD, and ADHD.   Past Medical History:  Past Medical History:  Diagnosis Date   Allergy    Anxiety    situational    Asthma    yeras ago - not current    Behcet's disease (HCC)    Bell's palsy    Cataract    both removed    Depression    situational -    Diplopia    Elevated blood pressure reading    no BP meds currently    Esotropia    Family history of adverse reaction to anesthesia    Fibromyalgia  GERD (gastroesophageal reflux disease)    Graves disease 1991   Heart murmur    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    Hip pain    HLA B27 (HLA B27 positive)    Hyperlipidemia    Hypertension    Knee pain    Memory changes    Morphea scleroderma    Neuromuscular disorder (HCC)    Raynauds    Pneumonia    Psoriatic arthritis (HCC)    seen in ears only   Raynaud's disease    Sjogren syndrome with dental involvement (HCC)     Past Surgical History:  Procedure Laterality Date   CYST REMOVAL HAND      left arm posterior   laproscopy     SALPINGECTOMY  1980   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   Procedure: LEFT TOTAL HIP ARTHROPLASTY ANTERIOR APPROACH;  Surgeon: Kathryne Hitch, MD;  Location: [facility];  Service: Orthopedics;  Laterality: Left;   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      Family Psychiatric History: Nephew - Schizophrenia Father - patient suspects that her father may be bipolar   Family History:  Family History  Problem Relation Age of Onset   Heart disease Mother    Congestive Heart Failure Mother    Dementia Mother    Heart disease Father    Heart attack Father    Dementia Sister    Hemachromatosis Sister    Parkinson's disease Sister    Allergies Son    Healthy Son    Colon cancer Neg Hx    Colon polyps Neg Hx    Esophageal cancer Neg Hx    Stomach cancer Neg Hx    Rectal cancer Neg Hx     Social History:  Social History   Socioeconomic History   Marital status: Single    Spouse name: Not on file   Number of children: Not on file   Years of education: Not on file   Highest education level: Some college, no degree  Occupational History   Occupation: Unemployed  Tobacco Use   Smoking status: Former    Passive exposure: Past   Smokeless tobacco: Never  Vaping Use   Vaping status: Never Used  Substance and Sexual Activity   Alcohol use: Not Currently   Drug use: Never   Sexual activity: Not on file  Other Topics Concern   Not on file  Social History Narrative   Not on file   Social Determinants of Health   Financial Resource Strain: High Risk (11/05/2022)   Overall Financial Resource Strain (CARDIA)    Difficulty of Paying Living Expenses: Hard  Food Insecurity: Patient Declined (11/05/2022)   Hunger Vital Sign    Worried About Running Out of Food in the Last Year: Patient declined    Ran Out of Food in the Last Year: Patient declined  Transportation Needs: Unmet Transportation Needs (11/05/2022)   PRAPARE - Scientist, research (physical sciences) (Medical): Yes    Lack of Transportation (Non-Medical): Yes  Physical Activity: Inactive (11/05/2022)   Exercise Vital Sign    Days of Exercise per Week: 0 days    Minutes of Exercise per Session: 0 min  Stress: Stress Concern Present (11/05/2022)   [employer] of Occupational Health - Occupational Stress Questionnaire    Feeling of Stress : Rather much  Social Connections: Unknown (11/05/2022)   Social Connection and Isolation Panel [NHANES]    Frequency  of Communication with Friends and Family: Once a week    Frequency of Social Gatherings with Friends and Family: Patient declined    Attends Religious Services: Never    Database administrator or Organizations: No    Attends Banker Meetings: Never    Marital Status: Patient declined    Allergies:  Allergies  Allergen Reactions   Contrast Media [Iodinated Contrast Media] Anaphylaxis   Prohance [Gadoteridol] Anaphylaxis   Ptu [Propylthiouracil]    Sulfa Antibiotics     Unknown reaction, family history   Tapazole [Methimazole]    Latex Hives and Rash    Metabolic Disorder Labs: Lab Results  Component Value Date   HGBA1C 5.4 11/26/2021   No results found for: "PROLACTIN" Lab Results  Component Value Date   CHOL 272 (H) 02/12/2020   TRIG 145 02/12/2020   HDL 71 02/12/2020   CHOLHDL 3.8 02/12/2020   LDLCALC 175 (H) 02/12/2020   Lab Results  Component Value Date   TSH 6.410 (H) 11/09/2022   TSH 3.63 06/01/2022    Therapeutic Level Labs: No results found for: "LITHIUM" No results found for: "VALPROATE" No results found for: "CBMZ"  Current Medications: Current Outpatient Medications  Medication Sig Dispense Refill   acetaminophen (TYLENOL) 325 MG tablet Take 650 mg by mouth every 6 (six) hours as needed for moderate pain.     [START ON 12/10/2022] amphetamine-dextroamphetamine (ADDERALL) 10 MG tablet Take 1 tablet (10 mg total) by mouth 2 (two) times daily with a meal. 60 tablet  0   [START ON 01/07/2023] amphetamine-dextroamphetamine (ADDERALL) 10 MG tablet Take 1 tablet (10 mg total) by mouth 2 (two) times daily with a meal. 60 tablet 0   [START ON 02/05/2023] amphetamine-dextroamphetamine (ADDERALL) 10 MG tablet Take 1 tablet (10 mg total) by mouth 2 (two) times daily with a meal. 60 tablet 0   aspirin-acetaminophen-caffeine (EXCEDRIN MIGRAINE) 250-250-65 MG tablet Take 2 tablets by mouth every 6 (six) hours as needed for headache.     atorvastatin (LIPITOR) 40 MG tablet TAKE ONE TABLET BY MOUTH ONE TIME DAILY 90 tablet 0   busPIRone (BUSPAR) 10 MG tablet Take 1 tablet (10 mg total) by mouth 3 (three) times daily. 90 tablet 1   cetirizine (ZYRTEC ALLERGY) 10 MG tablet Take 1 tablet (10 mg total) by mouth daily. 90 tablet 1   FEROSUL 325 (65 Fe) MG tablet TAKE ONE TABLET BY MOUTH EVERY OTHER DAY 90 tablet 1   fluticasone (FLONASE) 50 MCG/ACT nasal spray USE 2 SPRAYS IN EACH NOSTRIL ONCE TIME DAILY 16 mL 6   levothyroxine (SYNTHROID) 125 MCG tablet Take 1 tablet (125 mcg total) by mouth daily before breakfast. 30 tablet 5   lisinopril (ZESTRIL) 2.5 MG tablet TAKE ONE TABLET BY MOUTH AT BEDTIME 90 tablet 1   methocarbamol (ROBAXIN) 500 MG tablet TAKE ONE TABLET BY MOUTH EVERY 6 HOURS AS NEEDED FOR MUSCLE SPASM 40 tablet 1   omeprazole (PRILOSEC) 40 MG capsule Take 1 capsule (40 mg total) by mouth in the morning and at bedtime. 180 capsule 3   ondansetron (ZOFRAN) 4 MG tablet TAKE ONE TABLET BY MOUTH EVERY 8 HOURS AS NEEDED FOR NAUSEA OR VOMITING 20 tablet 0   PARoxetine (PAXIL) 40 MG tablet Take 1 tablet (40 mg total) by mouth daily. 90 tablet 1   QUEtiapine (SEROQUEL) 400 MG tablet Take 1 tablet (400 mg total) by mouth at bedtime. 90 tablet 1   RESTASIS 0.05 % ophthalmic emulsion INSTILL ONE  DROP INTO EACH EYE TWICE DAILY 60 each 3   rizatriptan (MAXALT-MLT) 10 MG disintegrating tablet Take 1 tablet (10 mg total) by mouth as needed for migraine. May repeat in 2 hours if  needed 10 tablet 0   traMADol (ULTRAM) 50 MG tablet Take 50 mg by mouth every 6 (six) hours as needed.     valACYclovir (VALTREX) 500 MG tablet TAKE ONE TABLET BY MOUTH TWICE A DAY FOR 7 DAYS 30 tablet 2   No current facility-administered medications for this visit.     Psychiatric Specialty Exam: Physical Exam Constitutional:      Appearance: the patient is not toxic-appearing.  Pulmonary:     Effort: Pulmonary effort is normal.  Neurological:     General: No focal deficit present.     Mental Status: the patient is alert and oriented to person, place, and time.   Review of Systems  Respiratory:  Negative for shortness of breath.   Cardiovascular:  Negative for chest pain.  Gastrointestinal:  Negative for abdominal pain, constipation, diarrhea, nausea and vomiting.  Neurological:  Negative for headaches.      There were no vitals taken for this visit.  General Appearance: Fairly Groomed  Eye Contact:  Good  Speech:  Clear and Coherent  Volume:  Normal  Mood:  "okay"  Affect:  somewhat depressed, does display positive emotion  Thought Process:  Coherent  Orientation:  Full (Time, Place, and Person)  Thought Content: Logical   Suicidal Thoughts:  No  Homicidal Thoughts:  No  Memory:  Immediate;   Good  Judgement:  fair  Insight:  fair  Psychomotor Activity:  Normal  Concentration:  Concentration: Good  Recall:  Good  Fund of Knowledge: Good  Language: Good  Akathisia:  No  Handed:  not assessed  AIMS (if indicated): not done  Assets:  Communication Skills Desire for Improvement Financial Resources/Insurance Housing Leisure Time Physical Health  ADL's:  Intact  Cognition: WNL  Sleep:  Fair     Screenings: GAD-7    Flowsheet Row Video Visit from 08/26/2021 in [facility] Video Visit from 06/24/2021 in [facility] Video Visit from 04/22/2021 in [facility] Video Visit from  03/20/2021 in [facility] Video Visit from 02/19/2021 in [facility]  Total GAD-7 Score 9 10 14 13 11       PHQ2-9    Flowsheet Row Office Visit from 08/10/2022 in [facility] [place]. [facility] Office Visit from 06/29/2022 in [facility] - [facility] Dept Of Eligha Bridegroom. [facility] Clinical Support from 03/09/2022 in [facility] - A Dept Of Eligha Bridegroom. [facility] Office Visit from 11/26/2021 in [facility] - A Dept Of [place]. [facility] Video Visit from 08/26/2021 in [facility]  PHQ-2 Total Score 2 6 2 4 6   PHQ-9 Total Score 15 19 15 13 18       Flowsheet Row Video Visit from 08/26/2021 in [facility] Video Visit from 06/24/2021 in [facility] Counselor from 05/05/2021 in [facility]  C-SSRS RISK CATEGORY Low Risk Low Risk Error: Question 6 not populated        Assessment and Plan:   Bipolar Disorder, Current Episode Mixed: -Continue Seroquel 400 mg QHS.  30  tablets with 1 refill. - EKG June 2024 QTc 413 - A1c of 5.6, will need new lipid panel - Continue therapy with Page     GAD: -Continue Paxil 40 mg daily -Continue Buspar 10 mg TID     ADHD: -Continue Adderall 10 mg BID      Collaboration of Care:   Patient/Guardian was advised Release of Information must be obtained prior to any record release in order to collaborate their care with an outside provider. Patient/Guardian was advised if they have not already done so to contact the registration department to sign all necessary forms in order for [place] to release information regarding their care.   Consent: Patient/Guardian gives verbal consent for treatment and assignment of benefits for services provided during this visit. Patient/Guardian expressed  understanding and agreed to proceed.    Carlyn Reichert, MD 12/05/2022, 3:33 PM  I provided 25 minutes of non-face-to-face time during this encounter.

## 2022-12-10 ENCOUNTER — Telehealth (HOSPITAL_COMMUNITY): Payer: Medicare Other | Admitting: Student

## 2022-12-11 ENCOUNTER — Ambulatory Visit
Admission: EM | Admit: 2022-12-11 | Discharge: 2022-12-11 | Disposition: A | Discharge status: Home / Self Care | Payer: Medicare Other | Attending: Emergency Medicine | Admitting: Emergency Medicine

## 2022-12-11 ENCOUNTER — Encounter: Payer: Self-pay | Admitting: Student

## 2022-12-11 ENCOUNTER — Ambulatory Visit (INDEPENDENT_AMBULATORY_CARE_PROVIDER_SITE_OTHER): Payer: Medicare Other

## 2022-12-11 DIAGNOSIS — S92514A Nondisplaced fracture of proximal phalanx of right lesser toe(s), initial encounter for closed fracture: Secondary | ICD-10-CM | POA: Diagnosis not present

## 2022-12-11 NOTE — Discharge Instructions (Addendum)
Please wear the post-op shoe for comfort Follow up with the foot specialist   Ice for 15-20 minutes at a time, a few times daily Elevate if possible

## 2022-12-11 NOTE — ED Triage Notes (Signed)
Pt reports pain, swelling and bruise in the right pinky toe x 2 days. Pt is not sure if she hit the toe with something, as she is "off balance all the time". Tylenol and ibuprofen gives no relief.

## 2022-12-11 NOTE — ED Provider Notes (Signed)
[facility]    CSN: [id number] Arrival date & time: 12/11/22  4098      History   Chief Complaint Chief Complaint  Patient presents with   Foot Pain    HPI Jodi Gilmore is a 67 y.o. female.  Foot pain started 2 days ago Bruise on the right 5th toe with swelling Reports may have hit on something Has tried ibuprofen and tylenol  Pain is rated 10/10  Follows with pain clinic, had 60 tablets of Norco prescribed yesterday Also trying lidocaine patches on the foot  Past Medical History:  Diagnosis Date   Allergy    Anxiety    situational    Asthma    yeras ago - not current    Behcet's disease (HCC)    Bell's palsy    Cataract    both removed    Depression    situational -    Diplopia    Elevated blood pressure reading    no BP meds currently    Esotropia    Family history of adverse reaction to anesthesia    Fibromyalgia    GERD (gastroesophageal reflux disease)    Graves disease 1991   Heart murmur    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    Hip pain    HLA B27 (HLA B27 positive)    Hyperlipidemia    Hypertension    Knee pain    Memory changes    Morphea scleroderma    Neuromuscular disorder (HCC)    Raynauds    Pneumonia    Psoriatic arthritis (HCC)    seen in ears only   Raynaud's disease    Sjogren syndrome with dental involvement [facility])     Patient Active Problem List   Diagnosis Date Noted   Otalgia of both ears 11/11/2022   Hepatic steatosis 11/11/2022   Tarry stools 08/12/2022   Lymphedema 08/12/2022   Chest pain 06/30/2022   Daytime sleepiness 06/30/2022   Other fatigue 06/01/2022   Esotropia 04/27/2022   Dysphagia 04/27/2022   Diplopia 11/26/2021   Status post left hip replacement 10/16/2020   Idiopathic scoliosis of lumbar spine 09/01/2020   Essential (primary) hypertension 07/17/2020   HSV (herpes simplex virus) anogenital infection 06/26/2020   Generalized anxiety disorder  06/04/2020   Attention deficit hyperactivity disorder (ADHD) 06/04/2020   Migraine headache with aura 05/17/2020   HLD (hyperlipidemia) 05/17/2020   Elevated C-reactive protein (CRP) 02/13/2020   Hip arthritis 02/13/2020   Bipolar disorder (HCC) 02/13/2020   Fibromyalgia 02/13/2020   Hypothyroidism (acquired) 01/03/2020   Disequilibrium 01/03/2020   Healthcare maintenance 01/03/2020    Past Surgical History:  Procedure Laterality Date   CYST REMOVAL HAND     left arm posterior   laproscopy     SALPINGECTOMY  1980   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   Procedure: LEFT TOTAL HIP ARTHROPLASTY ANTERIOR APPROACH;  Surgeon: Kathryne Hitch, MD;  Location: [facility];  Service: Orthopedics;  Laterality: Left;   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      OB History   No obstetric history on file.      Home Medications    Prior to Admission medications   Medication Sig Start Date End Date Taking? Authorizing Provider  HYDROcodone-acetaminophen (NORCO/VICODIN) 5-325 MG tablet Take 1 tablet by mouth every 6 (six) hours as needed for moderate pain (pain score 4-6). Prescribed by pain clinic   Yes [provider]  amphetamine-dextroamphetamine (ADDERALL)  10 MG tablet Take 1 tablet (10 mg total) by mouth 2 (two) times daily with a meal. 02/05/23   Carlyn Reichert, MD  atorvastatin (LIPITOR) 40 MG tablet TAKE ONE TABLET BY MOUTH ONE TIME DAILY 10/25/22   Erick Alley, DO  busPIRone (BUSPAR) 10 MG tablet Take 1 tablet (10 mg total) by mouth 3 (three) times daily. 12/03/22   Carlyn Reichert, MD  cetirizine (ZYRTEC ALLERGY) 10 MG tablet Take 1 tablet (10 mg total) by mouth daily. 02/12/20   Simmons-Robinson, Tawanna Cooler, MD  FEROSUL 325 (65 Fe) MG tablet TAKE ONE TABLET BY MOUTH EVERY OTHER DAY 07/27/22   Alicia Amel, MD  fluticasone [facility]) 50 MCG/ACT nasal spray USE 2 SPRAYS IN [facility] NOSTRIL ONCE TIME DAILY 10/12/22   Alicia Amel, MD  levothyroxine (SYNTHROID) 125 MCG tablet Take 1 tablet  (125 mcg total) by mouth daily before breakfast. 11/11/22   Alicia Amel, MD  lisinopril (ZESTRIL) 2.5 MG tablet TAKE ONE TABLET BY MOUTH AT BEDTIME 10/25/22   Erick Alley, DO  methocarbamol (ROBAXIN) 500 MG tablet TAKE ONE TABLET BY MOUTH EVERY 6 HOURS AS NEEDED FOR MUSCLE SPASM 12/02/22   Kathryne Hitch, MD  omeprazole (PRILOSEC) 40 MG capsule Take 1 capsule (40 mg total) by mouth in the morning and at bedtime. 10/27/22   Erick Alley, DO  ondansetron (ZOFRAN) 4 MG tablet TAKE ONE TABLET BY MOUTH EVERY 8 HOURS AS NEEDED FOR NAUSEA OR VOMITING 10/25/22   Erick Alley, DO  PARoxetine (PAXIL) 40 MG tablet Take 1 tablet (40 mg total) by mouth daily. 12/03/22 12/03/23  Carlyn Reichert, MD  QUEtiapine (SEROQUEL) 400 MG tablet Take 1 tablet (400 mg total) by mouth at bedtime. 12/03/22   Carlyn Reichert, MD  RESTASIS 0.05 % ophthalmic emulsion INSTILL ONE DROP INTO EACH EYE TWICE DAILY 09/18/20   Simmons-Robinson, Tawanna Cooler, MD  rizatriptan (MAXALT-MLT) 10 MG disintegrating tablet Take 1 tablet (10 mg total) by mouth as needed for migraine. May repeat in 2 hours if needed 11/09/22   Alicia Amel, MD  valACYclovir (VALTREX) 500 MG tablet TAKE ONE TABLET BY MOUTH TWICE A DAY FOR 7 DAYS 10/11/22   Alicia Amel, MD    Family History Family History  Problem Relation Age of Onset   Heart disease Mother    Congestive Heart Failure Mother    Dementia Mother    Heart disease Father    Heart attack Father    Dementia Sister    Hemachromatosis Sister    Parkinson's disease Sister    Allergies Son    Healthy Son    Colon cancer Neg Hx    Colon polyps Neg Hx    Esophageal cancer Neg Hx    Stomach cancer Neg Hx    Rectal cancer Neg Hx     Social History Social History   Tobacco Use   Smoking status: Former    Passive exposure: Past   Smokeless tobacco: Never  Vaping Use   Vaping status: Never Used  Substance Use Topics   Alcohol use: Not Currently   Drug use: Never      Allergies   Iodinated contrast media, Prohance [gadoteridol], Ioversol, Ptu [propylthiouracil], Sulfa antibiotics, Tapazole [methimazole], and Latex   Review of Systems Review of Systems  Per HPI  Physical Exam Triage Vital Signs ED Triage Vitals  Encounter Vitals Group     BP 12/11/22 1038 (!) 146/68     Systolic BP Percentile --  Diastolic BP Percentile --      Pulse Rate 12/11/22 1038 (!) 107     Resp 12/11/22 1038 18     Temp 12/11/22 1038 98.1 F (36.7 C)     Temp Source 12/11/22 1038 Oral     SpO2 12/11/22 1038 93 %     Weight --      Height --      Head Circumference --      Peak Flow --      Pain Score 12/11/22 1043 10     Pain Loc --      Pain Education --      Exclude from Growth Chart --    No data found.  Updated Vital Signs BP (!) 146/68 (BP Location: Right Arm)   Pulse 99   Temp 98.1 F (36.7 C) (Oral)   Resp 18   SpO2 93%   Physical Exam Vitals and nursing note reviewed.  Constitutional:      General: She is not in acute distress. Cardiovascular:     Rate and Rhythm: Normal rate and regular rhythm.     Pulses: Normal pulses.  Pulmonary:     Effort: Pulmonary effort is normal.  Musculoskeletal:     Cervical back: Normal range of motion.     Right foot: Bony tenderness present. Normal pulse.     Comments: Tender 5th right toe. There is bruising. Can wiggle. Cap refill < 2 seconds. Sensation normal. DP pulse 2+. No tenderness mid foot or ankle  Skin:    Capillary Refill: Capillary refill takes less than 2 seconds.  Neurological:     Mental Status: She is alert and oriented to person, place, and time.     UC Treatments / Results  Labs (all labs ordered are listed, but only abnormal results are displayed) Labs Reviewed - No data to display  EKG   Radiology DG Foot Complete Right  Result Date: 12/11/2022 CLINICAL DATA:  Toe swelling and pain EXAM: RIGHT FOOT COMPLETE - 3+ VIEW COMPARISON:  None Available. FINDINGS: Acute  nondisplaced fracture to the diaphysis of the fifth toe proximal phalanx. Fracture appears to be extra-articular. No additional fractures. No dislocation. Mild degenerative changes of the foot. Mild soft tissue swelling at the fracture site. IMPRESSION: Acute nondisplaced fracture of the fifth toe proximal phalanx. Electronically Signed   By: Duanne Guess D.O.   On: 12/11/2022 11:15    Procedures Procedures (including critical care time)  Medications Ordered in UC Medications - No data to display  Initial Impression / Assessment and Plan / UC Course  I have reviewed the triage vital signs and the nursing notes.  Pertinent labs & imaging results that were available during my care of the patient were reviewed by me and considered in my medical decision making (see chart for details).  Right foot xray with nondisplaced fracture of proximal phalanx, 5th toe. Discussed with patient.  Postop shoe was provided for comfort.  Pain control as needed.  Recommend ice and elevate.  Follow-up with foot specialist.  Patient agreeable to plan, all questions answered   Final Clinical Impressions(s) / UC Diagnoses   Final diagnoses:  Closed nondisplaced fracture of proximal phalanx of lesser toe of right foot, initial encounter     Discharge Instructions      Please wear the post-op shoe for comfort Follow up with the foot specialist   Ice for 15-20 minutes at a time, a few times daily Elevate if possible  ED Prescriptions   None    I have reviewed the PDMP during this encounter.   Marlow Baars, [place] 12/11/22 1211

## 2022-12-22 ENCOUNTER — Ambulatory Visit: Payer: Medicare Other

## 2022-12-24 ENCOUNTER — Other Ambulatory Visit: Payer: Self-pay | Admitting: Student

## 2022-12-24 DIAGNOSIS — G43109 Migraine with aura, not intractable, without status migrainosus: Secondary | ICD-10-CM

## 2022-12-27 ENCOUNTER — Ambulatory Visit: Payer: Medicare Other | Admitting: Student

## 2023-01-06 ENCOUNTER — Ambulatory Visit: Payer: Medicare Other | Admitting: Gastroenterology

## 2023-01-07 ENCOUNTER — Encounter: Payer: Self-pay | Admitting: Student

## 2023-01-11 ENCOUNTER — Other Ambulatory Visit: Payer: Self-pay | Admitting: Orthopaedic Surgery

## 2023-01-12 ENCOUNTER — Other Ambulatory Visit: Payer: Self-pay | Admitting: Student

## 2023-01-12 DIAGNOSIS — R110 Nausea: Secondary | ICD-10-CM

## 2023-01-17 ENCOUNTER — Encounter (HOSPITAL_BASED_OUTPATIENT_CLINIC_OR_DEPARTMENT_OTHER): Payer: Self-pay | Admitting: Internal Medicine

## 2023-01-18 ENCOUNTER — Encounter: Payer: Self-pay | Admitting: Student

## 2023-01-18 ENCOUNTER — Ambulatory Visit: Payer: Medicare Other | Admitting: Student

## 2023-01-18 NOTE — Progress Notes (Deleted)
    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  ***  ASSESSMENT/PLAN:   No problem-specific Assessment & Plan notes found for this encounter.     JINNY Con Gasman, MD [facility]

## 2023-01-19 ENCOUNTER — Ambulatory Visit: Payer: Medicare Other

## 2023-01-20 ENCOUNTER — Other Ambulatory Visit: Payer: Self-pay | Admitting: Student

## 2023-01-20 ENCOUNTER — Encounter (HOSPITAL_BASED_OUTPATIENT_CLINIC_OR_DEPARTMENT_OTHER): Payer: Self-pay

## 2023-01-20 ENCOUNTER — Encounter (HOSPITAL_BASED_OUTPATIENT_CLINIC_OR_DEPARTMENT_OTHER): Payer: Medicare Other | Admitting: Internal Medicine

## 2023-01-22 ENCOUNTER — Other Ambulatory Visit: Payer: Self-pay | Admitting: Student

## 2023-01-22 DIAGNOSIS — E611 Iron deficiency: Secondary | ICD-10-CM

## 2023-01-24 ENCOUNTER — Telehealth (HOSPITAL_COMMUNITY): Payer: Self-pay

## 2023-01-24 ENCOUNTER — Ambulatory Visit: Payer: Medicare Other | Admitting: Cardiovascular Disease

## 2023-01-24 NOTE — Telephone Encounter (Signed)
 Pt request rx request for 30 mg Paroxetine I only see 40 mg

## 2023-01-25 ENCOUNTER — Other Ambulatory Visit: Payer: Self-pay | Admitting: Student

## 2023-01-25 ENCOUNTER — Other Ambulatory Visit: Payer: Self-pay

## 2023-01-25 DIAGNOSIS — G43109 Migraine with aura, not intractable, without status migrainosus: Secondary | ICD-10-CM

## 2023-01-25 MED ORDER — RIZATRIPTAN BENZOATE 10 MG PO TBDP: 10.0000 mg | ORAL_TABLET | ORAL | 2 refills | Status: DC | PRN

## 2023-01-25 MED ORDER — VALACYCLOVIR HCL 500 MG PO TABS: ORAL_TABLET | ORAL | 2 refills | Status: DC

## 2023-01-31 ENCOUNTER — Encounter: Payer: Self-pay | Admitting: Student

## 2023-02-03 MED ORDER — VERAPAMIL HCL ER 120 MG PO TBCR: 120.0000 mg | EXTENDED_RELEASE_TABLET | Freq: Every day | ORAL | 1 refills | Status: DC

## 2023-02-05 ENCOUNTER — Encounter: Payer: Self-pay | Admitting: Student

## 2023-02-07 MED ORDER — RIZATRIPTAN BENZOATE 10 MG PO TABS: 10.0000 mg | ORAL_TABLET | ORAL | 2 refills | Status: DC | PRN

## 2023-02-08 ENCOUNTER — Ambulatory Visit
Admission: RE | Admit: 2023-02-08 | Discharge: 2023-02-08 | Disposition: A | Discharge status: Home / Self Care | Payer: Medicare Other | Source: Ambulatory Visit | Attending: Family Medicine | Admitting: Family Medicine

## 2023-02-08 DIAGNOSIS — Z1231 Encounter for screening mammogram for malignant neoplasm of breast: Secondary | ICD-10-CM

## 2023-02-10 ENCOUNTER — Ambulatory Visit
Admission: RE | Admit: 2023-02-10 | Discharge: 2023-02-10 | Disposition: A | Discharge status: Home / Self Care | Payer: Medicare Other | Source: Ambulatory Visit | Attending: Family Medicine | Admitting: Family Medicine

## 2023-02-10 DIAGNOSIS — M810 Age-related osteoporosis without current pathological fracture: Secondary | ICD-10-CM

## 2023-02-11 ENCOUNTER — Other Ambulatory Visit: Payer: Self-pay | Admitting: Student

## 2023-02-11 ENCOUNTER — Other Ambulatory Visit: Payer: Self-pay | Admitting: Orthopaedic Surgery

## 2023-02-11 DIAGNOSIS — R110 Nausea: Secondary | ICD-10-CM

## 2023-02-15 ENCOUNTER — Ambulatory Visit (INDEPENDENT_AMBULATORY_CARE_PROVIDER_SITE_OTHER): Payer: Medicare Other | Admitting: Student

## 2023-02-15 ENCOUNTER — Encounter: Payer: Self-pay | Admitting: Student

## 2023-02-15 VITALS — BP 136/78 | HR 95 | Ht 64.0 in | Wt 200.6 lb

## 2023-02-15 DIAGNOSIS — R42 Dizziness and giddiness: Secondary | ICD-10-CM | POA: Diagnosis not present

## 2023-02-15 DIAGNOSIS — M810 Age-related osteoporosis without current pathological fracture: Secondary | ICD-10-CM | POA: Diagnosis not present

## 2023-02-15 DIAGNOSIS — G43109 Migraine with aura, not intractable, without status migrainosus: Secondary | ICD-10-CM

## 2023-02-15 DIAGNOSIS — M352 Behcet's disease: Secondary | ICD-10-CM

## 2023-02-15 DIAGNOSIS — K760 Fatty (change of) liver, not elsewhere classified: Secondary | ICD-10-CM | POA: Diagnosis not present

## 2023-02-15 DIAGNOSIS — E039 Hypothyroidism, unspecified: Secondary | ICD-10-CM

## 2023-02-15 MED ORDER — CALCIUM 1000 + D 1000-20 MG-MCG PO TABS: 1.0000 | ORAL_TABLET | Freq: Every day | ORAL | 2 refills | Status: DC

## 2023-02-15 NOTE — Progress Notes (Signed)
 SUBJECTIVE:   CHIEF COMPLAINT / HPI:   Behcet's Disease  Neurologic Issues  With a history of both oral and genital ulcers.  She tells me that her oral ulcers are really quite bothersome at this point and that she is having a hard time even eating or drinking if she would like due to these ulcers.  She is also undergoing neurologic workup for a constellation of neurologic symptoms including ticlopidine, weakness, gait instability that has largely been unremarkable.  There is some initial thought for possible myasthenia gravis but testing has been negative.  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  The recommendation per radiology was for a repeat MRI with contrast.  Unfortunately, she cannot have MR contrast (anaphylaxis).  There is a repeat MRI brain that I have ordered to further investigate this lesion.    Migraine with Aura We recently transitioned her to verapamil  for migraine prophylaxis with the thought that some of her unusual neurologic complaints could be explained by atypical migraine.  Thankfully, she tells me that her headaches are quite well-controlled on verapamil  (she tells me that today is the first day that she has had a headache since starting this) but her neurologic issues, including diplopia, disequilibrium, and weakness, have persisted.  Osteoporosis New diagnosis today based on recent DEXA with T-score of -2.6 at the femoral neck.  Discussed treatment options including oral Fosamax  versus Prolia  versus yearly zoledronic acid infusions.  She feels the Prolia  would be the best fit for her.  I agree.  Hepatic Steatosis She raises concerns about her  liver health based on elevated ALT and alk phos on her most recent CMP.  We reviewed past imaging today which is consistent with hepatic steatosis.  She had negative testing for hepatitis C in 2021 and has had a fairly wide-ranging autoimmune workup as part of her neurologic workup as above.    PERTINENT  PMH / PSH: Behcet's disease, bipolar disorder, attention deficit hyperactivity disorder, diplopia, essential hypertension, migraine with aura  OBJECTIVE:   BP 136/78   Pulse 95   Ht 5' 4 (1.626 m)   Wt 200 lb 9.6 oz (91 kg)   SpO2 94%   BMI 34.43 kg/m   Gen: Anxious, at baseline HEENT: Bilateral TM and canal are unremarkable She does have oral ulcerations on the inside of her lower lip as well as on both sides of her tongue, her mucous membranes are moist and her oropharynx is unremarkable Neuro: Remains with a bit of an unsteady gait, though is unchanged from my prior assessments.  Her speech is fluent and coherent.  She has normal muscle tone and bulk and her strength is symmetric throughout.  ASSESSMENT/PLAN:   Assessment & Plan Behcet's disease (HCC) With active flare of oral ulcers impacting her ability to take p.o.  Will treat with a course of prednisone  and also start her on colchicine .  Ideally would get her on apremilast but it is unclear if this will be covered by her insurance.  Colchicine  will need to be dose-adjusted based on her also being on verapamil .  Verapamil  inhibition of sit through a 4 can increase area under the curve of sit through  a for metabolize drugs by 1.6-2.2x. Therefore will dose her colchicine  at just 0.6mg  once daily for now to minimize risk of toxicity.  -Prednisone  taper 40mg /day x 6 days, 20mg /day x4 days, 10mg /day  x4 days.  -Colchicine  0.6 mg once daily -Repeat brain MRI pending -I think it is also important to consider the possibility of neuro Behcet's disease contributing to her manifold neurologic complaints.  The finding of brain lesions that  are isointense on T1 weighting and hyperintense on T2 weighting is consistent with neuro Behcet's (Diagnosis and management of Neuro-Behet's disease: international consensus recommendations, Claretha dunker al., 2014).  I will touch base with her [facility] neurologist once her repeat MRI brain is back.  I will also ask him what other neuro imaging he would recommend acknowledging that we are limited by her gadolinium contrast allergy -Ongoing follow-up with ophthalmology for her diplopia -Follow-up with me in 4 weeks Migraine with aura and without status migrainosus, not intractable It seems that she is responding quite well to verapamil  in terms of symptom control.  Unfortunately, this has not made a difference on her neurologic symptoms. -Continue verapamil  CR 120 mg daily Age-related osteoporosis without current pathological fracture New diagnosis.  T-score of -2.6 on her femoral neck. -Start supplementation of calcium  and vitamin D  -CMP, mag, Phos, vitamin D  today -Start Prolia  if appropriate based on labs, reviewed clinic process of having her pick this up in the community and bring into clinic for injection Hepatic steatosis This is almost certainly the cause of her elevated ALT on her most recent labs.  Discussed that this is a metabolic finding consistent with her known metabolic syndrome as evidenced by increased BMI and hypertension.  She has a fib 4 score of just 1.09 excluding advanced fibrosis. -As she does not have concomitant diabetes, will not treat with pioglitazone at this point as the risks likely outweigh the benefits -Plan to revisit her liver health once we get the more acute and pressing issues discussed above addressed -Repeat CMP today as above Hypothyroidism (acquired) Currently taking 125 mcg of levothyroxine  daily. -Repeat TSH today per patient request  J Con Gasman, MD [facility]

## 2023-02-15 NOTE — Patient Instructions (Addendum)
 I will call your [facility] Neurologist to see if they are going to order those MRIs or if I should.   Depending on what your Calcium  looks like, we can order Prolia  shots for your osteoporosis.   I don't think we need to intervene on your liver beyond lifestyle changes.   I am going to looks into apremilast affordability for you.   I want you in CBT. You can get into this on your own through [email or web address] and searching by your insurance.  See me in a few weeks.   JINNY Con Gasman, MD

## 2023-02-16 ENCOUNTER — Encounter: Payer: Self-pay | Admitting: Student

## 2023-02-16 LAB — COMPREHENSIVE METABOLIC PANEL
ALT: 32 [IU]/L (ref 0–32)
AST: 28 [IU]/L (ref 0–40)
Albumin: 4.5 g/dL (ref 3.9–4.9)
Alkaline Phosphatase: 161 [IU]/L — ABNORMAL HIGH (ref 44–121)
BUN/Creatinine Ratio: 19 (ref 12–28)
BUN: 16 mg/dL (ref 8–27)
Bilirubin Total: 0.2 mg/dL (ref 0.0–1.2)
CO2: 23 mmol/L (ref 20–29)
Calcium: 9.3 mg/dL (ref 8.7–10.3)
Chloride: 103 mmol/L (ref 96–106)
Creatinine, Ser: 0.84 mg/dL (ref 0.57–1.00)
Globulin, Total: 2.2 g/dL (ref 1.5–4.5)
Glucose: 85 mg/dL (ref 70–99)
Potassium: 4.4 mmol/L (ref 3.5–5.2)
Sodium: 140 mmol/L (ref 134–144)
Total Protein: 6.7 g/dL (ref 6.0–8.5)
eGFR: 76 mL/min/{1.73_m2} (ref >59)

## 2023-02-16 LAB — TSH: TSH: 5.25 u[IU]/mL — ABNORMAL HIGH (ref 0.450–4.500)

## 2023-02-16 LAB — MAGNESIUM: Magnesium: 2.4 mg/dL — ABNORMAL HIGH (ref 1.6–2.3)

## 2023-02-16 LAB — PHOSPHORUS: Phosphorus: 4.2 mg/dL (ref 3.0–4.3)

## 2023-02-16 LAB — VITAMIN D 25 HYDROXY (VIT D DEFICIENCY, FRACTURES): Vit D, 25-Hydroxy: 26.5 ng/mL — ABNORMAL LOW (ref 30.0–100.0)

## 2023-02-17 MED ORDER — DENOSUMAB 60 MG/ML ~~LOC~~ SOSY: 60.0000 mg | PREFILLED_SYRINGE | Freq: Once | SUBCUTANEOUS | 0 refills | Status: AC

## 2023-02-17 MED ORDER — PREDNISONE 20 MG PO TABS: ORAL_TABLET | ORAL | 0 refills | Status: AC

## 2023-02-17 MED ORDER — COLCHICINE 0.6 MG PO TABS: 0.6000 mg | ORAL_TABLET | Freq: Every day | ORAL | 0 refills | Status: DC

## 2023-02-17 NOTE — Assessment & Plan Note (Signed)
 It seems that she is responding quite well to verapamil  in terms of symptom control.  Unfortunately, this has not made a difference on her neurologic symptoms. -Continue verapamil  CR 120 mg daily

## 2023-02-17 NOTE — Assessment & Plan Note (Signed)
 Currently taking 125 mcg of levothyroxine  daily. -Repeat TSH today per patient request

## 2023-02-17 NOTE — Assessment & Plan Note (Signed)
 This is almost certainly the cause of her elevated ALT on her most recent labs.  Discussed that this is a metabolic finding consistent with her known metabolic syndrome as evidenced by increased BMI and hypertension.  She has a fib 4 score of just 1.09 excluding advanced fibrosis. -As she does not have concomitant diabetes, will not treat with pioglitazone at this point as the risks likely outweigh the benefits -Plan to revisit her liver health once we get the more acute and pressing issues discussed above addressed -Repeat CMP today as above

## 2023-02-18 ENCOUNTER — Encounter: Payer: Self-pay | Admitting: Student

## 2023-02-21 ENCOUNTER — Telehealth (INDEPENDENT_AMBULATORY_CARE_PROVIDER_SITE_OTHER): Payer: Self-pay | Admitting: Otolaryngology

## 2023-02-21 NOTE — Telephone Encounter (Signed)
 Reminder Call: Date: 02/22/2023 Status: Sch  Time: 2:00 PM [address] [place], [place] [address]  Confirmed time and location w/patient.

## 2023-02-22 ENCOUNTER — Ambulatory Visit (INDEPENDENT_AMBULATORY_CARE_PROVIDER_SITE_OTHER): Payer: Medicare Other | Admitting: Otolaryngology

## 2023-02-22 VITALS — BP 142/89 | HR 99

## 2023-02-22 DIAGNOSIS — K1379 Other lesions of oral mucosa: Secondary | ICD-10-CM

## 2023-02-22 DIAGNOSIS — H938X1 Other specified disorders of right ear: Secondary | ICD-10-CM

## 2023-02-22 DIAGNOSIS — H9190 Unspecified hearing loss, unspecified ear: Secondary | ICD-10-CM | POA: Diagnosis not present

## 2023-02-22 DIAGNOSIS — H9221 Otorrhagia, right ear: Secondary | ICD-10-CM

## 2023-02-22 DIAGNOSIS — R1314 Dysphagia, pharyngoesophageal phase: Secondary | ICD-10-CM

## 2023-02-22 NOTE — Progress Notes (Signed)
 Dear Dr. Deirdre Priest, Here is my assessment for our mutual patient, Jodi Gilmore. Thank you for allowing me the opportunity to care for your patient. Please do not hesitate to contact me should you have any other questions. Sincerely, Dr. Jovita Kussmaul  Otolaryngology Clinic Note Referring provider: Dr. Deirdre Priest HPI:  Jodi Gilmore is a 68 y.o. female kindly referred by Dr. Deirdre Priest for evaluation of ear complaints  Initial visit (02/2023): Patient reports: reports that she had some spontaneous ear bleeding, from the right side intermittently since Oct 2024. Also having some pain and fullness.Tends to sleep on right side, which makes it worse, and changing positions makes it better. Also reports some hearing trouble, and declining for at least 6 months. Unclear which ear is worse. Denies tinnitus but does report some popping/crackling. No frequent infections. Ear drops (reports tried) but did not help. Does use q-tips. Patient also denies barotrauma, vestibular suppressant use, ototoxic medication use Prior ear surgery: no  Patient also reports: dry mouth (Sjogren's diagnosis several years ago but now not sure if accurate per patient); swallowing issue also worse, with solids/liquids/pills and associated coughing. Does report she has had esophageal spasms. She is on PPI BID. She does have an appt with GI coming up for dysphagia but reports some supraglottic pain. She prior had recurrent oral ulcers and is on colchicine, but has not had ulcers for some time. Patient otherwise denies: - aspiration episodes or PNA, need for Heimlich, unintentional weight loss - changes in voice, shortness of breath, hemoptysis - neck masses  PMHx: Migraines, Behcet's disease (suspected), Hepatic Steatosis, HTN H&N Surgery: no Personal or FHx of bleeding dz or anesthesia difficulty: no   Tobacco: former, quit  Independent Review of Additional Tests or Records:  Dr. Marisue Humble (11/09/2022): R ear bleeding, chronic  bilatera lear pain (R>L), does clean ears with q-tip; R ear excoriation; Dysphagia - some supraglottic pain, request referral to ENT; weight stable; Ref to ENT [facility] SLP [name], SLP) 03/17/2022: notes reviewed for MBS; normal OP swallow, slight/transient thin liquid peentration; no residual; some esophageal retention; concern for esophageal component; Ref to ENT CBC/CMP 11/09/2022 reviewed: WBC 10.2, Hgb 13.1, Plt 291; BUN/Cr 22/1.01; Alk phos 157, AST/ALT 28/35 TSH 6.4 (11/09/2022)  PMH/Meds/All/SocHx/FamHx/ROS:   Past Medical History:  Diagnosis Date   Allergy    Anxiety    situational    Asthma    yeras ago - not current    Behcet's disease (HCC)    Bell's palsy    Cataract    both removed    Depression    situational -    Diplopia    Elevated blood pressure reading    no BP meds currently    Esotropia    Family history of adverse reaction to anesthesia    Fibromyalgia    GERD (gastroesophageal reflux disease)    Graves disease 1991   Heart murmur    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    Hip pain    HLA B27 (HLA B27 positive)    Hyperlipidemia    Hypertension    Knee pain    Memory changes    Morphea scleroderma    Neuromuscular disorder (HCC)    Raynauds    Pneumonia    Psoriatic arthritis (HCC)    seen in ears only   Raynaud's disease    Sjogren syndrome with dental involvement [facility])      Past Surgical History:  Procedure Laterality Date  CYST REMOVAL HAND     left arm posterior   laproscopy     SALPINGECTOMY  1980   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   Procedure: LEFT TOTAL HIP ARTHROPLASTY ANTERIOR APPROACH;  Surgeon: Kathryne Hitch, MD;  Location: [facility];  Service: Orthopedics;  Laterality: Left;   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      Family History  Problem Relation Age of Onset   Heart disease Mother    Congestive Heart Failure Mother    Dementia Mother    Heart disease Father    Heart  attack Father    Dementia Sister    Hemachromatosis Sister    Parkinson's disease Sister    Allergies Son    Healthy Son    Colon cancer Neg Hx    Colon polyps Neg Hx    Esophageal cancer Neg Hx    Stomach cancer Neg Hx    Rectal cancer Neg Hx    BRCA 1/2 Neg Hx    Breast cancer Neg Hx      Social Connections: Unknown (12/23/2022)   Social Connection and Isolation Panel [NHANES]    Frequency of Communication with Friends and Family: Twice a week    Frequency of Social Gatherings with Friends and Family: Twice a week    Attends Religious Services: Never    Database administrator or Organizations: No    Attends Engineer, structural: Not on file    Marital Status: Patient declined      Current Outpatient Medications:    atorvastatin (LIPITOR) 40 MG tablet, TAKE ONE TABLET BY MOUTH ONE TIME DAILY, Disp: 90 tablet, Rfl: 0   Calcium Carb-Cholecalciferol (CALCIUM 1000 + D) 1000-20 MG-MCG TABS, Take 1 tablet by mouth daily., Disp: 30 tablet, Rfl: 2   cetirizine (ZYRTEC ALLERGY) 10 MG tablet, Take 1 tablet (10 mg total) by mouth daily., Disp: 90 tablet, Rfl: 1   colchicine 0.6 MG tablet, Take 1 tablet (0.6 mg total) by mouth daily., Disp: 60 tablet, Rfl: 0   FEROSUL 325 (65 Fe) MG tablet, TAKE ONE TABLET BY MOUTH EVERY OTHER DAY, Disp: 90 tablet, Rfl: 1   fluticasone (FLONASE) 50 MCG/ACT nasal spray, USE 2 SPRAYS IN EACH NOSTRIL ONCE TIME DAILY, Disp: 16 mL, Rfl: 6   HYDROcodone-acetaminophen (NORCO/VICODIN) 5-325 MG tablet, Take 1 tablet by mouth every 6 (six) hours as needed for moderate pain (pain score 4-6). Prescribed by pain clinic, Disp: , Rfl:    methocarbamol (ROBAXIN) 500 MG tablet, TAKE ONE TABLET BY MOUTH EVERY 6 HOURS AS NEEDED FOR MUSCLE SPASM, Disp: 40 tablet, Rfl: 1   omeprazole (PRILOSEC) 40 MG capsule, Take 1 capsule (40 mg total) by mouth in the morning and at bedtime., Disp: 180 capsule, Rfl: 3   ondansetron (ZOFRAN) 4 MG tablet, TAKE ONE TABLET BY MOUTH EVERY  8 HOURS AS NEEDED FOR NAUSEA AND/OR VOMITING, Disp: 20 tablet, Rfl: 0   RESTASIS 0.05 % ophthalmic emulsion, INSTILL ONE DROP INTO EACH EYE TWICE DAILY, Disp: 60 each, Rfl: 3   rizatriptan (MAXALT) 10 MG tablet, Take 1 tablet (10 mg total) by mouth as needed for migraine. May repeat in 2 hours if needed, Disp: 30 tablet, Rfl: 2   alendronate (FOSAMAX) 70 MG tablet, Take 1 tablet (70 mg total) by mouth every 7 (seven) days. Take with a full glass of water on an empty stomach., Disp: 12 tablet, Rfl: 1   amphetamine-dextroamphetamine (ADDERALL) 10 MG tablet, Take 1 tablet (10 mg total)  by mouth 2 (two) times daily with a meal., Disp: 60 tablet, Rfl: 0   [START ON 04/01/2023] amphetamine-dextroamphetamine (ADDERALL) 10 MG tablet, Take 1 tablet (10 mg total) by mouth 2 (two) times daily with a meal., Disp: 60 tablet, Rfl: 0   busPIRone (BUSPAR) 10 MG tablet, Take 1 tablet (10 mg total) by mouth 3 (three) times daily., Disp: 90 tablet, Rfl: 2   clotrimazole (LOTRIMIN) 1 % cream, Apply 1 Application topically 2 (two) times daily. To right underarm, Disp: 30 g, Rfl: 0   lidocaine (XYLOCAINE) 2 % solution, Swish and spit 15mL no more than every 4 hours for oral ulcer pain., Disp: 100 mL, Rfl: 3   PARoxetine (PAXIL) 40 MG tablet, Take 1 tablet (40 mg total) by mouth daily., Disp: 90 tablet, Rfl: 1   QUEtiapine (SEROQUEL) 400 MG tablet, Take 1 tablet (400 mg total) by mouth at bedtime., Disp: 90 tablet, Rfl: 1   valACYclovir (VALTREX) 500 MG tablet, TAKE ONE TABLET BY MOUTH TWICE A DAY FOR 7 DAYS, Disp: 30 tablet, Rfl: 2   verapamil (CALAN-SR) 120 MG CR tablet, TAKE ONE TABLET BY MOUTH ONE TIME DAILY AT BEDTIME, Disp: 90 tablet, Rfl: 1   Physical Exam:   BP (!) 142/89 (BP Location: Left Arm, Patient Position: Sitting, Cuff Size: Large)   Pulse 99   SpO2 93%   Salient findings:  CN II-XII intact Given history and complaints, ear microscopy was indicated and performed for evaluation with findings as below in  physical exam section and in procedures Bilateral EAC clear and TM intact with well pneumatized middle ear spaces. No bleeding or lesions, mild excoriation right inferior canal Weber 512: mid Rinne 512: AC > BC b/l  Anterior rhinoscopy: Septum intact; bilateral inferior turbinates without significant hypertrophy No lesions of oral cavity/oropharynx; oral cavity mildly dry, no oral ulcers noted but some lip scarring but not concerning for malignancy. No obviously palpable neck masses/lymphadenopathy/thyromegaly No respiratory distress or stridor.    Seprately Identifiable Procedures:  Procedure Note Pre-procedure diagnosis:  Dysphagia, history of oral ulcers Post-procedure diagnosis: Same Procedure: Transnasal Fiberoptic Laryngoscopy, CPT 31575 - Mod 25 Indication: see above Complications: None apparent EBL: 0 mL  The procedure was undertaken to further evaluate the patient's complaint of dysphagia, history of oral ulcers, with mirror exam inadequate for appropriate examination due to gag reflex and poor patient tolerance  Procedure:  Patient was identified as correct patient. Verbal consent was obtained. The nose was sprayed with oxymetazoline and 4% lidocaine. The The flexible laryngoscope was passed through the nose to view the nasal cavity, pharynx (oropharynx, hypopharynx) and larynx.  The larynx was examined at rest and during multiple phonatory tasks. Documentation was obtained and reviewed with patient. The scope was removed. The patient tolerated the procedure well.  Findings: The nasal cavity and nasopharynx did not reveal any masses or lesions, mucosa appeared to be without obvious lesions. The tongue base, pharyngeal walls, piriform sinuses, vallecula, epiglottis and postcricoid region are normal in appearance; no significant retained pyriform secretions, mild post-cricoid edema The visualized portion of the subglottis and proximal trachea is widely patent. The vocal folds are  mobile bilaterally. There are no lesions on the free edge of the vocal folds nor elsewhere in the larynx worrisome for malignancy.  ETD without masses b/l    Electronically signed by: Read Drivers, MD 03/15/2023 2:02 PM   Impression & Plans:  Jodi Gilmore is a 68 y.o. female with:  1. Subjective hearing loss  2. Ear bleeding, right   3. Ear fullness, right   4. Pharyngoesophageal dysphagia   5. Recurrent oral ulcers    Primary complaint is ear bleeding, fullness, and subjective HL; exam is quite reassuring today (see photos above) and suspect bleeding due to qtip use and excoriation resulting; no ETD masses, has not had audio so will obtain. - dry ear, avoid qtip use - audiogram ordered; depending on results, will f/u  Dysphagia - prior MBS reassuring, noted esophageal component; given c/f sjogren's and possible behcet's we did do TFL which was reassuring without lesions. She already has an appt with GI and would defer to them, may have referred sensation and possible dysmotility.-  Continue PPI  Recurrent oral ulcers - none, cnocern for behcet's now worked up by Dr. Marisue Humble on colchicine; no concerning lesions; good oral care, can bx if need be for dx but will observe currently  - f/u d/w pt, PRN unless issues no audio; happy to see her back depending on GI workup.  See below regarding exact medications prescribed this encounter including dosages and route: No orders of the defined types were placed in this encounter.     Thank you for allowing me the opportunity to care for your patient. Please do not hesitate to contact me should you have any other questions.  Sincerely, Jovita Kussmaul, MD Otolaryngologist (ENT), [facility] Phone: [phone] Fax: [phone]  03/15/2023, 2:02 PM   MDM:  Level 4 - [phone] Complexity/Problems addressed: mod - multiple chronic problems, new problem Data complexity: mod - independent review of notes, labs, ordering test -  Morbidity: lown - Prescription Drug prescribed or managed:o

## 2023-02-23 ENCOUNTER — Ambulatory Visit: Payer: Medicare Other | Admitting: Gastroenterology

## 2023-02-25 ENCOUNTER — Telehealth (HOSPITAL_COMMUNITY): Payer: Medicare Other | Admitting: Student

## 2023-02-25 DIAGNOSIS — F411 Generalized anxiety disorder: Secondary | ICD-10-CM

## 2023-02-25 DIAGNOSIS — F909 Attention-deficit hyperactivity disorder, unspecified type: Secondary | ICD-10-CM

## 2023-02-25 DIAGNOSIS — F3160 Bipolar disorder, current episode mixed, unspecified: Secondary | ICD-10-CM

## 2023-02-25 MED ORDER — AMPHETAMINE-DEXTROAMPHETAMINE 10 MG PO TABS: 10.0000 mg | ORAL_TABLET | Freq: Two times a day (BID) | ORAL | 0 refills | Status: DC

## 2023-02-25 MED ORDER — QUETIAPINE FUMARATE 400 MG PO TABS
400.0000 mg | ORAL_TABLET | Freq: Every day | ORAL | 1 refills | Status: DC
Start: 2023-02-25 — End: 2023-04-22

## 2023-02-25 MED ORDER — BUSPIRONE HCL 10 MG PO TABS: 10.0000 mg | ORAL_TABLET | Freq: Three times a day (TID) | ORAL | 2 refills | Status: DC

## 2023-02-25 MED ORDER — PAROXETINE HCL 40 MG PO TABS: 40.0000 mg | ORAL_TABLET | Freq: Every day | ORAL | 1 refills | Status: DC

## 2023-02-25 NOTE — Progress Notes (Signed)
BH MD Outpatient Progress Note  Date of visit: 02/25/2023 Jodi Gilmore  MRN:  [id number]  Televisit via video: I connected with Jodi Gilmore on 02/25/2023 at 11:00 AM EST by a video enabled telemedicine application and verified that I am speaking with the correct person using two identifiers.  Location: Patient: Home Provider: Office   I discussed the limitations of evaluation and management by telemedicine and the availability of in person appointments. The patient expressed understanding and agreed to proceed.  I discussed the assessment and treatment plan with the patient. The patient was provided an opportunity to ask questions and all were answered. The patient agreed with the plan and demonstrated an understanding of the instructions.   The patient was advised to call back or seek an in-person evaluation if the symptoms worsen or if the condition fails to improve as anticipated.  Assessment:  Jodi Gilmore presents for follow-up evaluation. Today, patient reports stable psychiatric symptomatology, an unchanged level of mild to moderate depression and anxiety. She does not desire a medication change at this time and this was agreed upon. No acute safety concerns.   Identifying Information: Jodi Gilmore is a 68 y.o. y.o. female with a history of BPAD, GAD, and ADHD who is an established patient with [facility] for management of anxiety and depression.   Plan:  # Bipolar disorder, GAD Interventions: -- Continue Paxil 40 mg daily -- Continue Seroquel 400 mg at bedtime  # ADHD -- Continue Adderall 10 mg BID  Patient was given contact information for behavioral health clinic and was instructed to call 911 for emergencies.   Subjective:  Chief Complaint:  Chief Complaint  Patient presents with   Follow-up    Interval History:  Patient reports continued medical and financial difficulties. Despite this she is lively during the interview. She reports  that her son is doing well and she is happy that he was married a few months ago. She does report a stable and chronic level of anxiety as well as days of depressed mood. She denies experiencing any form of suicidal thoughts since the last visit.   Visit Diagnosis:    ICD-10-CM   1. Bipolar affective disorder, current episode mixed, current episode severity unspecified (HCC)  F31.60 PARoxetine (PAXIL) 40 MG tablet    QUEtiapine (SEROQUEL) 400 MG tablet    2. Generalized anxiety disorder  F41.1 PARoxetine (PAXIL) 40 MG tablet    busPIRone (BUSPAR) 10 MG tablet    3. Attention deficit hyperactivity disorder (ADHD), unspecified ADHD type  F90.9 amphetamine-dextroamphetamine (ADDERALL) 10 MG tablet    amphetamine-dextroamphetamine (ADDERALL) 10 MG tablet      Past Psychiatric History:  Previous admit 40 years ago is [place] for severe depression. Patient denies prior suicide attempts or self-harm.   Past Medical History:  Past Medical History:  Diagnosis Date   Allergy    Anxiety    situational    Asthma    yeras ago - not current    Behcet's disease (HCC)    Bell's palsy    Cataract    both removed    Depression    situational -    Diplopia    Elevated blood pressure reading    no BP meds currently    Esotropia    Family history of adverse reaction to anesthesia    Fibromyalgia    GERD (gastroesophageal reflux disease)    Graves disease 1991   Heart murmur    High human leukocyte antigen (HLA)  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 determined by flow cytometry    Hip pain    HLA B27 (HLA B27 positive)    Hyperlipidemia    Hypertension    Knee pain    Memory changes    Morphea scleroderma    Neuromuscular disorder (HCC)    Raynauds    Pneumonia    Psoriatic arthritis (HCC)    seen in ears only   Raynaud's disease    Sjogren syndrome with dental involvement (HCC)     Past Surgical History:  Procedure Laterality Date   CYST REMOVAL HAND     left arm posterior   laproscopy      SALPINGECTOMY  1980   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   Procedure: LEFT TOTAL HIP ARTHROPLASTY ANTERIOR APPROACH;  Surgeon: Kathryne Hitch, MD;  Location: [facility];  Service: Orthopedics;  Laterality: Left;   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      Family Psychiatric History: none pertinent  Family History:  Family History  Problem Relation Age of Onset   Heart disease Mother    Congestive Heart Failure Mother    Dementia Mother    Heart disease Father    Heart attack Father    Dementia Sister    Hemachromatosis Sister    Parkinson's disease Sister    Allergies Son    Healthy Son    Colon cancer Neg Hx    Colon polyps Neg Hx    Esophageal cancer Neg Hx    Stomach cancer Neg Hx    Rectal cancer Neg Hx    BRCA 1/2 Neg Hx    Breast cancer Neg Hx     Social History:  Social History   Socioeconomic History   Marital status: Single    Spouse name: Not on file   Number of children: Not on file   Years of education: Not on file   Highest education level: Some college, no degree  Occupational History   Occupation: Unemployed  Tobacco Use   Smoking status: Former    Passive exposure: Past   Smokeless tobacco: Never  Vaping Use   Vaping status: Never Used  Substance and Sexual Activity   Alcohol use: Not Currently   Drug use: Never   Sexual activity: Not Currently  Other Topics Concern   Not on file  Social History Narrative   Not on file   Social Drivers of Health   Financial Resource Strain: Medium Risk (12/23/2022)   Overall Financial Resource Strain (CARDIA)    Difficulty of Paying Living Expenses: Somewhat hard  Food Insecurity: Food Insecurity Present (12/23/2022)   Hunger Vital Sign    Worried About Running Out of Food in the Last Year: Sometimes true    Ran Out of Food in the Last Year: Patient declined  Transportation Needs: Unmet Transportation Needs (12/23/2022)   PRAPARE - Transportation    Lack of Transportation (Medical): Yes    Lack of  Transportation (Non-Medical): Yes  Physical Activity: Inactive (12/23/2022)   Exercise Vital Sign    Days of Exercise per Week: 0 days    Minutes of Exercise per Session: 0 min  Stress: Stress Concern Present (12/23/2022)   [employer] of Occupational Health - Occupational Stress Questionnaire    Feeling of Stress : To some extent  Social Connections: Unknown (12/23/2022)   Social Connection and Isolation Panel [NHANES]    Frequency of Communication with Friends and Family: Twice a week    Frequency of Social Gatherings with Friends  and Family: Twice a week    Attends Religious Services: Never    Active Member of Clubs or Organizations: No    Attends Banker Meetings: Never    Marital Status: Patient declined    Allergies:  Allergies  Allergen Reactions   Iodinated Contrast Media Anaphylaxis   Prohance [Gadoteridol] Anaphylaxis   Ioversol    Ptu [Propylthiouracil]    Sulfa Antibiotics Hives and Other (See Comments)    Unknown reaction, family history   Tapazole [Methimazole]    Latex Hives and Rash    Current Medications: Current Outpatient Medications  Medication Sig Dispense Refill   [START ON 03/06/2023] amphetamine-dextroamphetamine (ADDERALL) 10 MG tablet Take 1 tablet (10 mg total) by mouth 2 (two) times daily with a meal. 60 tablet 0   [START ON 04/01/2023] amphetamine-dextroamphetamine (ADDERALL) 10 MG tablet Take 1 tablet (10 mg total) by mouth 2 (two) times daily with a meal. 60 tablet 0   atorvastatin (LIPITOR) 40 MG tablet TAKE ONE TABLET BY MOUTH ONE TIME DAILY 90 tablet 0   busPIRone (BUSPAR) 10 MG tablet Take 1 tablet (10 mg total) by mouth 3 (three) times daily. 90 tablet 2   Calcium Carb-Cholecalciferol (CALCIUM 1000 + D) 1000-20 MG-MCG TABS Take 1 tablet by mouth daily. 30 tablet 2   cetirizine (ZYRTEC ALLERGY) 10 MG tablet Take 1 tablet (10 mg total) by mouth daily. 90 tablet 1   colchicine 0.6 MG tablet Take 1 tablet (0.6 mg total) by mouth  daily. 60 tablet 0   FEROSUL 325 (65 Fe) MG tablet TAKE ONE TABLET BY MOUTH EVERY OTHER DAY 90 tablet 1   fluticasone (FLONASE) 50 MCG/ACT nasal spray USE 2 SPRAYS IN EACH NOSTRIL ONCE TIME DAILY 16 mL 6   HYDROcodone-acetaminophen (NORCO/VICODIN) 5-325 MG tablet Take 1 tablet by mouth every 6 (six) hours as needed for moderate pain (pain score 4-6). Prescribed by pain clinic     levothyroxine (SYNTHROID) 125 MCG tablet Take 1 tablet (125 mcg total) by mouth daily before breakfast. 30 tablet 5   methocarbamol (ROBAXIN) 500 MG tablet TAKE ONE TABLET BY MOUTH EVERY 6 HOURS AS NEEDED FOR MUSCLE SPASM 40 tablet 1   omeprazole (PRILOSEC) 40 MG capsule Take 1 capsule (40 mg total) by mouth in the morning and at bedtime. 180 capsule 3   ondansetron (ZOFRAN) 4 MG tablet TAKE ONE TABLET BY MOUTH EVERY 8 HOURS AS NEEDED FOR NAUSEA AND/OR VOMITING 20 tablet 0   PARoxetine (PAXIL) 40 MG tablet Take 1 tablet (40 mg total) by mouth daily. 90 tablet 1   predniSONE (DELTASONE) 20 MG tablet Take 2 tablets (40 mg total) by mouth daily with breakfast for 6 days, THEN 1 tablet (20 mg total) daily with breakfast for 4 days, THEN 0.5 tablets (10 mg total) daily with breakfast for 4 days. 18 tablet 0   QUEtiapine (SEROQUEL) 400 MG tablet Take 1 tablet (400 mg total) by mouth at bedtime. 90 tablet 1   RESTASIS 0.05 % ophthalmic emulsion INSTILL ONE DROP INTO EACH EYE TWICE DAILY 60 each 3   rizatriptan (MAXALT) 10 MG tablet Take 1 tablet (10 mg total) by mouth as needed for migraine. May repeat in 2 hours if needed 30 tablet 2   valACYclovir (VALTREX) 500 MG tablet TAKE ONE TABLET BY MOUTH TWICE A DAY FOR 7 DAYS 30 tablet 2   verapamil (CALAN-SR) 120 MG CR tablet Take 1 tablet (120 mg total) by mouth at bedtime. 30 tablet 1  No current facility-administered medications for this visit.     Objective:  Psychiatric Specialty Exam: Physical Exam Constitutional:      Appearance: the patient is not toxic-appearing.   Pulmonary:     Effort: Pulmonary effort is normal.  Neurological:     General: No focal deficit present.     Mental Status: the patient is alert and oriented to person, place, and time.   Review of Systems  Respiratory:  Negative for shortness of breath.   Cardiovascular:  Negative for chest pain.  Gastrointestinal:  Negative for abdominal pain, constipation, diarrhea, nausea and vomiting.  Neurological:  Negative for headaches.      There were no vitals taken for this visit.  General Appearance: Fairly Groomed  Eye Contact:  Good  Speech:  Clear and Coherent  Volume:  Normal  Mood:  Euthymic  Affect:  Congruent  Thought Process:  Coherent  Orientation:  Full (Time, Place, and Person)  Thought Content: Logical   Suicidal Thoughts:  No  Homicidal Thoughts:  No  Memory:  Immediate;   Good  Judgement:  fair  Insight:  fair  Psychomotor Activity:  Normal  Concentration:  Concentration: Good  Recall:  Good  Fund of Knowledge: Good  Language: Good  Akathisia:  No  Handed:    AIMS (if indicated): not done  Assets:  Communication Skills Desire for Improvement Financial Resources/Insurance Housing Leisure Time Physical Health  ADL's:  Intact  Cognition: WNL  Sleep:  Fair     Metabolic Disorder Labs: Lab Results  Component Value Date   HGBA1C 5.4 11/26/2021   No results found for: "PROLACTIN" Lab Results  Component Value Date   CHOL 272 (H) 02/12/2020   TRIG 145 02/12/2020   HDL 71 02/12/2020   CHOLHDL 3.8 02/12/2020   LDLCALC 175 (H) 02/12/2020   Lab Results  Component Value Date   TSH 5.250 (H) 02/15/2023   TSH 6.410 (H) 11/09/2022    Therapeutic Level Labs: No results found for: "LITHIUM" No results found for: "VALPROATE" No results found for: "CBMZ"  Screenings: GAD-7    Flowsheet Row Video Visit from 08/26/2021 in [place] [facility] Video Visit from 06/24/2021 in [facility] Video Visit from  04/22/2021 in [facility] Video Visit from 03/20/2021 in [facility] Video Visit from 02/19/2021 in [facility]  Total GAD-7 Score 9 10 14 13 11       PHQ2-9    Flowsheet Row Office Visit from 08/10/2022 in [facility] Office Visit from 06/29/2022 in [facility]. [facility] Clinical Support from 03/09/2022 in [facility] Eligha Bridegroom. [facility] Office Visit from 11/26/2021 in [facility] [place]. [facility] Video Visit from 08/26/2021 in [facility]  PHQ-2 Total Score 2 6 2 4 6   PHQ-9 Total Score 15 19 15 13 18       Flowsheet Row ED from 12/11/2022 in [facility]) Video Visit from 08/26/2021 in [facility] Video Visit from 06/24/2021 in [facility]  C-SSRS RISK CATEGORY No Risk Low Risk Low Risk       Collaboration of Care: none  A total of  30 minutes was spent involved in face to face clinical care, chart review, documentation.   Jodi Reichert, MD 02/25/2023, 11:28 AM

## 2023-03-02 ENCOUNTER — Encounter: Payer: Self-pay | Admitting: Student

## 2023-03-04 ENCOUNTER — Ambulatory Visit: Payer: Medicare Other | Admitting: Student

## 2023-03-06 ENCOUNTER — Other Ambulatory Visit: Payer: Self-pay | Admitting: Student

## 2023-03-11 ENCOUNTER — Encounter: Payer: Self-pay | Admitting: Student

## 2023-03-11 ENCOUNTER — Other Ambulatory Visit: Payer: Self-pay | Admitting: *Deleted

## 2023-03-11 ENCOUNTER — Ambulatory Visit (INDEPENDENT_AMBULATORY_CARE_PROVIDER_SITE_OTHER): Payer: Medicare Other | Admitting: Student

## 2023-03-11 VITALS — BP 126/72 | HR 105 | Ht 64.0 in | Wt 202.0 lb

## 2023-03-11 DIAGNOSIS — M810 Age-related osteoporosis without current pathological fracture: Secondary | ICD-10-CM | POA: Diagnosis not present

## 2023-03-11 DIAGNOSIS — R21 Rash and other nonspecific skin eruption: Secondary | ICD-10-CM

## 2023-03-11 DIAGNOSIS — R269 Unspecified abnormalities of gait and mobility: Secondary | ICD-10-CM

## 2023-03-11 DIAGNOSIS — K1379 Other lesions of oral mucosa: Secondary | ICD-10-CM | POA: Diagnosis present

## 2023-03-11 MED ORDER — CLOTRIMAZOLE 1 % EX CREA: 1.0000 | TOPICAL_CREAM | Freq: Two times a day (BID) | CUTANEOUS | 0 refills | Status: DC

## 2023-03-11 MED ORDER — LEVOTHYROXINE SODIUM 150 MCG PO TABS: 150.0000 ug | ORAL_TABLET | ORAL | 3 refills | Status: DC

## 2023-03-11 MED ORDER — ALENDRONATE SODIUM 70 MG PO TABS: 70.0000 mg | ORAL_TABLET | ORAL | 1 refills | Status: DC

## 2023-03-11 MED ORDER — LIDOCAINE VISCOUS HCL 2 % MT SOLN: OROMUCOSAL | 3 refills | Status: AC

## 2023-03-11 NOTE — Progress Notes (Unsigned)
 SUBJECTIVE:   CHIEF COMPLAINT / HPI:   Recurrent Oral and Genital Ulcers   Behcet's Disease  Diagnosed in [place] ~25 years ago by a Dr. Kandace Blitz per Ms. Morgan's report. She believes diagnosis was aided with a biopsy of her lip. Obviously we do not have records dating that far back.  She does have a history of recurrent oral and genital ulcers.  She thinks she has had a PCR proven HSV infection at some point in the past, but unfortunately we do not have those records.  At our last visit we treated the acute ulcerations with a course of prednisone and then started colchicine for chronic therapy. She noticed a big difference (felt that her tongue was less swollen) with the steroids but benefits of colchicine are yet to be seen. She continues to have significant pain in her mouth, but does not have any new lesions at this time, just some scarring/pitting from old lesions.   Abnormal Gait This has been an issue since at least 2012.  She was seen by [facility] on 10/05/23. They felt her symptoms were multifactorial and possibly related to her degenerative spinal disease, thyroid dysfunction, myofascial pain syndrome, possible primary ocular issue, and functional neurologic disorder.  They ordered an MRI of the C spine, L-spine, T-spine.  However it sounds like there is some confusion as to whether this was to happen in [place] or [place] so these were never scheduled troponin.  Osteoporosis This was newly diagnosed at her last visit with me. We had tried to get her on Prolia, but this was not covered by her insurance. Therefore she expresses interest today in trying oral alendronate.   Underarm Rash  Unilateral itchy red rash in her right armpit. Present for quite some time. Not on the other side. Does not get the same under the breasts or within the groin.   OBJECTIVE:   BP 126/72   Pulse (!) 105   Ht 5\' 4"  (1.626 m)   Wt 202 lb (91.6 kg)   SpO2 95%   BMI 34.67 kg/m   Gen:  Appears at baseline HENT: Oral mucosa without new ulcerated lesions, there are some pitted scars to the inside of the lower lip as seen in the photo below.  Neuro: Unsteady gait, similar to my prior assessments.         ASSESSMENT/PLAN:   Assessment & Plan Recurrent oral ulcers Differential for her recurrent oral and genital ulcers includes Behcet's disease vs HSV. She has been taking colchicine for about three weeks now. Overall her oral exam is improved and she has no genital lesions at present, which suggests perhaps we are moving in the right direction. Unfortunately, she still have quite a bit of mouth pain despite the lack of active ulcers.  - She will try to get records from the biopsy/formal diagnosis of Behcet's  - Next time she has genital lesions, would have her come in same- or next-day to obtain HSV swab of the lesions  - Continue daily colchicine - Viscous lidocaine rinse for mouth pain - She is interested in [place] in the future if we get incomplete control with colchicine, though it does not appear that this is covered by her insurance. 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  Continue to wonder if this could be related to possible neuro-Behcets. There was some confusion over who would be ordering/scheduling her MR C-,T-,L-spine. These have not yet been done.  - Ordered MRIs to be done here in [place] - Once these are done would encourage her to follow-up with [facility]  Age related osteoporosis, unspecified pathological fracture presence Unable to do Prolia due to insurance coverage/cost. - PO Alendronate 70mg  weekly - Continue daily Vit D and Calcium supplementation - Would benefit from resistance training  Rash Tinea corporis vs intertrigo,  though more strongly suspect tinea given isolated (albeit large) lesion. - Clotrimazole cream BID   Follow-up in 2 weeks. Will repeat thyroid function studies at that visit.   Eliezer Mccoy, MD [facility]

## 2023-03-11 NOTE — Patient Instructions (Addendum)
 Iris Pert to see you. Sorry things are not moving in the right direction.  Your homework: - Get me records from [place] from when you were diagnosed with Behcets - Get your MRIs and make follow-up with [facility] (Dr. Tito Dine) - AS SOON as you have a genital ulcer, come in for [place] to swab it - Use the clotrimazole cream in your right underarm - Try the viscous lidocaine for oral pain - Take your alendronate FIRST think in the morning, at least 30 minutes before any food or drink or medication - Drink a whole glass of water with your alendronate - Remain upright for at least 30 minutes after taking it   My homework: - I will see what it might take to get you apremilast. The [place] records would help here  - I am adjusting your synthroid. We can repeat labs next time I see you.   Come back in 2 weeks to discuss night sweats.  Eliezer Mccoy, MD

## 2023-03-12 ENCOUNTER — Encounter: Payer: Self-pay | Admitting: Student

## 2023-03-14 MED ORDER — VALACYCLOVIR HCL 500 MG PO TABS: ORAL_TABLET | ORAL | 2 refills | Status: AC

## 2023-03-15 ENCOUNTER — Encounter (INDEPENDENT_AMBULATORY_CARE_PROVIDER_SITE_OTHER): Payer: Self-pay

## 2023-03-15 ENCOUNTER — Encounter: Payer: Self-pay | Admitting: Student

## 2023-03-16 ENCOUNTER — Other Ambulatory Visit: Payer: Self-pay | Admitting: Orthopaedic Surgery

## 2023-03-17 ENCOUNTER — Encounter: Payer: Self-pay | Admitting: Student

## 2023-03-18 ENCOUNTER — Telehealth: Payer: Self-pay | Admitting: *Deleted

## 2023-03-18 NOTE — Telephone Encounter (Signed)
 LMOVM to let pt know I scheduled her MRIs and that her information is on her mychart. Krissy Orebaugh Bruna Potter, CMA

## 2023-03-18 NOTE — Telephone Encounter (Signed)
-----   Message from [facility] Jasmine December S sent at 03/18/2023  9:12 AM EST ----- Ok to schedule all 3 MRI's at [facility].  Thanks! Melvenia Beam

## 2023-03-22 ENCOUNTER — Telehealth: Payer: Self-pay | Admitting: Student

## 2023-03-22 ENCOUNTER — Encounter: Payer: Self-pay | Admitting: Student

## 2023-03-22 ENCOUNTER — Ambulatory Visit (INDEPENDENT_AMBULATORY_CARE_PROVIDER_SITE_OTHER): Payer: Self-pay | Admitting: Student

## 2023-03-22 VITALS — BP 118/67 | HR 99 | Ht 64.0 in | Wt 201.4 lb

## 2023-03-22 DIAGNOSIS — K760 Fatty (change of) liver, not elsewhere classified: Secondary | ICD-10-CM

## 2023-03-22 DIAGNOSIS — E559 Vitamin D deficiency, unspecified: Secondary | ICD-10-CM | POA: Diagnosis not present

## 2023-03-22 DIAGNOSIS — Z114 Encounter for screening for human immunodeficiency virus [HIV]: Secondary | ICD-10-CM | POA: Diagnosis not present

## 2023-03-22 DIAGNOSIS — B354 Tinea corporis: Secondary | ICD-10-CM

## 2023-03-22 DIAGNOSIS — E039 Hypothyroidism, unspecified: Secondary | ICD-10-CM | POA: Diagnosis not present

## 2023-03-22 DIAGNOSIS — R61 Generalized hyperhidrosis: Secondary | ICD-10-CM | POA: Diagnosis present

## 2023-03-22 DIAGNOSIS — M352 Behcet's disease: Secondary | ICD-10-CM | POA: Diagnosis not present

## 2023-03-22 DIAGNOSIS — M810 Age-related osteoporosis without current pathological fracture: Secondary | ICD-10-CM | POA: Diagnosis not present

## 2023-03-22 DIAGNOSIS — F5104 Psychophysiologic insomnia: Secondary | ICD-10-CM

## 2023-03-22 DIAGNOSIS — Z599 Problem related to housing and economic circumstances, unspecified: Secondary | ICD-10-CM

## 2023-03-22 DIAGNOSIS — R748 Abnormal levels of other serum enzymes: Secondary | ICD-10-CM | POA: Diagnosis not present

## 2023-03-22 LAB — POCT SEDIMENTATION RATE: POCT SED RATE: 10 mm/h (ref 0–22)

## 2023-03-22 MED ORDER — DOXEPIN HCL 10 MG/ML PO CONC: 3.0000 mg | Freq: Every day | ORAL | 0 refills | Status: DC

## 2023-03-22 NOTE — Progress Notes (Cosign Needed Addendum)
 SUBJECTIVE:   CHIEF COMPLAINT / HPI:   Night Sweats She experiences severe night sweats, waking up completely soaked and needing to change nightgowns multiple times a night. This has been ongoing for many months. This differs from her previous menopausal night sweats. She has tried changing her sheets from bamboo to flannel, which has helped slightly as flannel dries faster. No fever or chills are present.  Osteoporosis She takes calcium, vitamin D, and has been prescribed alendronate. She is confused about the timing and interaction of these medications, particularly with her levothyroxine, and has not started alendronate due to concerns about interactions and timing with meals.  Oral Ulcers  Behcet's Disease Likely Behcet's disease based on remote biopsy. Unfortunately do not have records of that. We started Colchicine about two months ago and it seems to be helping. No active oral or genital ulcers at this time.   Hepatic Steatosis Noted on most recent CTAP in 09/2022. She tells me that in going through old records looking for the Behcet's diagnosis that she notes liver abnormalities dating back 20-25 years. Unclear if/to what extent this has been worked up in the past. 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   Tinea Corporis Started topical clotrimazole about 2 weeks ago with excellent response.   Poor Sleep  Longstanding issue. Interested in medication to help. Seems likely this is exacerbated by the night sweats. Has tried trazodone in the past but did not like the side effects (extreme tiredness).   Financial Strain "I'm not going broke, I'm broke" and medication costs are a major contributor here. Would like a referral to care management/social work. She is also concerned that she may be losing her housing due to her landlord not renewing her lease "it's a HUD thing."    OBJECTIVE:   BP 118/67   Pulse 99   Ht 5\' 4"  (1.626 m)   Wt 201 lb  6.4 oz (91.4 kg)   SpO2 95%   BMI 34.57 kg/m   Gen: At baseline, less anxious today compared to my most recent assessments Pulm: Normal WOB on RA Neuro: Unsteady gait but appears at baseline  Skin: There is some residual erythema in the area where she had tinea corporis of the right axilla but it is much improved and no longer has scaly ring  ASSESSMENT/PLAN:   Assessment & Plan Night sweats Severe and ongoing for several months. Changing nightclothes up to 4x nightly. She is well-beyond menopause. - CBC, CMP, CRP, ESR, TSH, HIV, QuantGOLD - CXR  - Doubt infection with no fevers/chills, but should labs return with elevated WBC +/- markedly elevated ESR/CRP, would consider workup for endocarditis to include blood cultures +/- echocardiogram, though this seems very unlikely at this juncture  Hepatic steatosis Noted on imaging and per patient report present for some time. Also with isolated elevated alk phos on recent CMPs.  - CMP, Hepatitis panel, GGT - If no evidence of hepatitis on lab workup above, next step in workup would be RUQ [place] with elastography. Fatty liver would seem to be the most likely culprit here - Remote history of etOH use.  Behcet's disease (HCC) Excellent response to colchicine.  - Continue colchicine 0.6mg  daily  Age-related osteoporosis without current pathological fracture Reviewed the mechanisms of action of Calcium, Vit D, and Alendronate in addressing bone growth/resorption. She is concerned about calcium inhibiting absorption of her levothyroxine.  - Can take levothyroxine in the morning and calcium-D  in the evening  - Alendronate 70mg  weekly on an empty stomach with a full glass of water  Tinea corporis Excellent response. Continue clotrimazole to resolution.  Psychophysiological insomnia - Doxepin 3mg  nightly. Note she is concurrently on Paxil which could increase serum concentrations and drug effects of Doxepin, so will start at lowest dose and titrate to  6-10mg  as needed.  Financial difficulties - VBCI referral   Patient's PHQ-9 was positive with an answer of 1 on question #9. Patient affirms that she has no plans or intention of killing herself. Points to staying alive for her son as a protective factor. Is well-established with psychiatry. Actually feels pretty well today. Ongoing f/u with psychiatry.   Eliezer Mccoy, MD [facility]

## 2023-03-22 NOTE — Addendum Note (Signed)
 Addended by: Jennette Bill on: 03/22/2023 03:06 PM   Modules accepted: Orders

## 2023-03-22 NOTE — Telephone Encounter (Signed)
 Patient is requesting a prescription for:  Name of Medication(s):  Doxepin Last date of OV:  03/22/23 Pharmacy:  Same  Will route refill request to Clinic RN.  Discussed with patient policy to call pharmacy for future refills.  Also, discussed refills may take up to 48 hours to approve or deny.  Vilinda Blanks

## 2023-03-22 NOTE — Assessment & Plan Note (Signed)
 Noted on imaging and per patient report present for some time. Also with isolated elevated alk phos on recent CMPs.  - CMP, Hepatitis panel, GGT - If no evidence of hepatitis on lab workup above, next step in workup would be RUQ [place] with elastography. Fatty liver would seem to be the most likely culprit here - Remote history of etOH use.

## 2023-03-22 NOTE — Patient Instructions (Addendum)
 Please go to [facility] at [employer] to get your X-Ray done. They are open 7:30a-5p Monday-Friday. You do not need an appointment to get this done.   Take your Vit D and Calcium EVERY day. Take the Alendronate every week.   I'm SO glad the colchicine seems to be working.   Send me a photo through MyChart of the scleroderma biopsy.   Eliezer Mccoy, MD

## 2023-03-23 ENCOUNTER — Telehealth: Payer: Self-pay

## 2023-03-23 ENCOUNTER — Encounter: Payer: Self-pay | Admitting: Student

## 2023-03-23 ENCOUNTER — Ambulatory Visit: Payer: Medicare Other | Admitting: Cardiovascular Disease

## 2023-03-23 DIAGNOSIS — M352 Behcet's disease: Secondary | ICD-10-CM

## 2023-03-23 MED ORDER — COLCHICINE 0.6 MG PO TABS: 0.6000 mg | ORAL_TABLET | Freq: Every day | ORAL | 3 refills | Status: AC

## 2023-03-23 NOTE — Progress Notes (Signed)
  Medicaid Managed Care   Unsuccessful Attempt Note   03/23/2023 Name: Onika Gudiel MRN: [id number] DOB: 1956-01-12  Referred by: Alicia Amel, MD Reason for referral : High Risk Managed Medicaid (MM Initial Outreach for BSW. )   An unsuccessful telephone outreach was attempted today. The patient was referred to the case management team for assistance with care management and care coordination.    Follow Up Plan: The Managed Medicaid care management team will reach out to the patient again over the next 3 days.    [facility]  [facility], [facility] Health Care Management Assistant Direct Dial: [phone]  Fax: [phone]

## 2023-03-24 ENCOUNTER — Encounter: Payer: Self-pay | Admitting: Student

## 2023-03-24 ENCOUNTER — Telehealth: Payer: Self-pay

## 2023-03-24 MED ORDER — LEVOTHYROXINE SODIUM 137 MCG PO TABS: 137.0000 ug | ORAL_TABLET | Freq: Every day | ORAL | 0 refills | Status: DC

## 2023-03-24 NOTE — Telephone Encounter (Signed)
 PA submitted to Covermymeds.   Will await response.   Veronda Prude, RN

## 2023-03-24 NOTE — Telephone Encounter (Signed)
 Pharmacy Patient Advocate Encounter   Received notification from CoverMyMeds that prior authorization for DOXEPIN CONCENTRATE is required/requested.   Insurance verification completed.   The patient is insured through [place] .   PA required; PA started via CoverMyMeds. KEY [id number] . Waiting for clinical questions to populate.

## 2023-03-24 NOTE — Addendum Note (Signed)
 Addended by: Darnelle Spangle B on: 03/24/2023 03:07 PM   Modules accepted: Orders

## 2023-03-24 NOTE — Telephone Encounter (Signed)
 Pharmacy Patient Advocate Encounter  Received notification from [facility] that Prior Authorization for DOXEPIN ORAL CONC. has been APPROVED from 03/24/23 to 01/11/24   PA #/Case ID/Reference #: [id number]

## 2023-03-25 ENCOUNTER — Encounter: Payer: Self-pay | Admitting: Student

## 2023-03-26 LAB — CBC WITH DIFFERENTIAL/PLATELET
Basophils Absolute: 0 10*3/uL (ref 0.0–0.2)
Basos: 1 %
EOS (ABSOLUTE): 0.3 10*3/uL (ref 0.0–0.4)
Eos: 6 %
Hematocrit: 41.7 % (ref 34.0–46.6)
Hemoglobin: 14.1 g/dL (ref 11.1–15.9)
Immature Grans (Abs): 0 10*3/uL (ref 0.0–0.1)
Immature Granulocytes: 0 %
Lymphocytes Absolute: 1.1 10*3/uL (ref 0.7–3.1)
Lymphs: 21 %
MCH: 33.1 pg — ABNORMAL HIGH (ref 26.6–33.0)
MCHC: 33.8 g/dL (ref 31.5–35.7)
MCV: 98 fL — ABNORMAL HIGH (ref 79–97)
Monocytes Absolute: 0.4 10*3/uL (ref 0.1–0.9)
Monocytes: 8 %
Neutrophils Absolute: 3.6 10*3/uL (ref 1.4–7.0)
Neutrophils: 64 %
Platelets: 297 10*3/uL (ref 150–450)
RBC: 4.26 x10E6/uL (ref 3.77–5.28)
RDW: 12.7 % (ref 11.7–15.4)
WBC: 5.6 10*3/uL (ref 3.4–10.8)

## 2023-03-26 LAB — COMPREHENSIVE METABOLIC PANEL
ALT: 44 IU/L — ABNORMAL HIGH (ref 0–32)
AST: 35 IU/L (ref 0–40)
Albumin: 4.7 g/dL (ref 3.9–4.9)
Alkaline Phosphatase: 137 IU/L — ABNORMAL HIGH (ref 44–121)
BUN/Creatinine Ratio: 21 (ref 12–28)
BUN: 18 mg/dL (ref 8–27)
Bilirubin Total: 0.3 mg/dL (ref 0.0–1.2)
CO2: 27 mmol/L (ref 20–29)
Calcium: 9.8 mg/dL (ref 8.7–10.3)
Chloride: 101 mmol/L (ref 96–106)
Creatinine, Ser: 0.84 mg/dL (ref 0.57–1.00)
Globulin, Total: 2.2 g/dL (ref 1.5–4.5)
Glucose: 110 mg/dL — ABNORMAL HIGH (ref 70–99)
Potassium: 4.6 mmol/L (ref 3.5–5.2)
Sodium: 143 mmol/L (ref 134–144)
Total Protein: 6.9 g/dL (ref 6.0–8.5)
eGFR: 76 mL/min/{1.73_m2} (ref >59)

## 2023-03-26 LAB — TSH: TSH: 6.29 u[IU]/mL — ABNORMAL HIGH (ref 0.450–4.500)

## 2023-03-26 LAB — QUANTIFERON-TB GOLD PLUS
QuantiFERON Mitogen Value: >10 [IU]/mL
QuantiFERON Nil Value: 0.03 [IU]/mL
QuantiFERON TB1 Ag Value: 0.03 [IU]/mL
QuantiFERON TB2 Ag Value: 0.03 [IU]/mL
QuantiFERON-TB Gold Plus: NEGATIVE

## 2023-03-26 LAB — HEPATITIS B CORE AB W/REFLEX: Hep B Core Total Ab: NEGATIVE

## 2023-03-26 LAB — C-REACTIVE PROTEIN: CRP: 6 mg/L (ref 0–10)

## 2023-03-26 LAB — HEPATITIS B SURFACE ANTIGEN: Hepatitis B Surface Ag: NEGATIVE

## 2023-03-26 LAB — GAMMA GT: GGT: 176 IU/L — ABNORMAL HIGH (ref 0–60)

## 2023-03-26 LAB — VITAMIN D 25 HYDROXY (VIT D DEFICIENCY, FRACTURES): Vit D, 25-Hydroxy: 26.4 ng/mL — ABNORMAL LOW (ref 30.0–100.0)

## 2023-03-26 LAB — HCV AB W REFLEX TO QUANT PCR: HCV Ab: NONREACTIVE

## 2023-03-26 LAB — HCV INTERPRETATION

## 2023-03-26 LAB — HEPATITIS B SURFACE ANTIBODY, QUANTITATIVE: Hepatitis B Surf Ab Quant: <3.5 m[IU]/mL — ABNORMAL LOW

## 2023-03-28 ENCOUNTER — Telehealth: Payer: Self-pay

## 2023-03-28 NOTE — Progress Notes (Signed)
 Complex Care Management Note  Care Guide Note 03/29/2023 Name: Sorrel Cassetta MRN: [id number] DOB: 1955-10-07  Davis Vannatter is a 68 y.o. year old female who sees Alicia Amel, MD for primary care. I reached out to [name] by phone today to offer complex care management services.  Ms. Bachtell was given information about Complex Care Management services today including:   The Complex Care Management services include support from the care team which includes your Nurse Care Manager, Clinical Social Worker, or Pharmacist.  The Complex Care Management team is here to help remove barriers to the health concerns and goals most important to you. Complex Care Management services are voluntary, and the patient may decline or stop services at any time by request to their care team member.   Complex Care Management Consent Status: Patient did not agree to participate in complex care management services at this time.  Follow up plan:  Patient declined social work Insurance claims handler, but agreed to [employer] care guide.  Encounter Outcome:  Patient Refused  Baruch Gouty [facility], [facility] Health Care Management Assistant Direct Dial: [phone]  Fax: [phone]

## 2023-03-28 NOTE — Progress Notes (Signed)
   Telephone encounter was:  Unsuccessful.  03/28/2023 Name: Jodi Gilmore MRN: [id number] DOB: [birth year]  Unsuccessful outbound call made today to assist with:  [employer] Attempt:  1st Attempt  A HIPAA compliant voice message was left requesting a return call.  Instructed patient to call back    Lenard Forth [facility] Guide, Phone: [phone] Fax: [phone] Website: [email or web address]

## 2023-03-31 ENCOUNTER — Telehealth: Payer: Self-pay

## 2023-03-31 NOTE — Progress Notes (Signed)
   Telephone encounter was:  Successful.  Complex Care Management Note Care Guide Note  03/31/2023 Name: Jodi Gilmore MRN: [id number] DOB: Aug 28, 1955  Jodi Gilmore is a 68 y.o. year old female who is a primary care patient of Alicia Amel, MD . The community resource team was consulted for assistance with Financial Difficulties related to Financial Strain  SDOH screenings and interventions completed:  No        Care guide performed the following interventions: Patient provided with information about care guide support team and interviewed to confirm resource needs.Patient requested I call back after lunch 3/20 or 3/21   Follow Up Plan:  Care guide will follow up with patient by phone over the next day  Encounter Outcome:  Patient Request to Call Back   Lenard Forth [facility]  [facility] Guide, Phone: [phone] Fax: [phone] Website: [email or web address]

## 2023-04-01 ENCOUNTER — Encounter: Payer: Self-pay | Admitting: Student

## 2023-04-04 ENCOUNTER — Telehealth: Payer: Self-pay

## 2023-04-04 ENCOUNTER — Ambulatory Visit (HOSPITAL_COMMUNITY)
Admission: RE | Admit: 2023-04-04 | Discharge: 2023-04-04 | Disposition: A | Discharge status: Home / Self Care | Source: Ambulatory Visit | Attending: Family Medicine | Admitting: Family Medicine

## 2023-04-04 ENCOUNTER — Encounter (HOSPITAL_COMMUNITY): Payer: Self-pay

## 2023-04-04 DIAGNOSIS — R269 Unspecified abnormalities of gait and mobility: Secondary | ICD-10-CM

## 2023-04-04 NOTE — Progress Notes (Signed)
   Telephone encounter was:  Successful.  Complex Care Management Note Care Guide Note  04/04/2023 Name: Jodi Gilmore MRN: [id number] DOB: 09/25/1955  Jodi Gilmore is a 69 y.o. year old female who is a primary care patient of Alicia Amel, MD . The community resource team was consulted for assistance with Financial Difficulties related to Financial strain  SDOH screenings and interventions completed:  No        Care guide performed the following interventions: Patient provided with information about care guide support team and interviewed to confirm resource needs.  Follow Up Plan:  Care guide will follow up with patient by phone over the next week  Encounter Outcome:  Patient Request to Call Back   Lenard Forth [facility]  [facility] Guide, Phone: [phone] Fax: [phone] Website: [email or web address]      Lenard Forth [facility]  [facility] Guide, Phone: [phone] Fax: [phone] Website: [email or web address]

## 2023-04-05 ENCOUNTER — Encounter: Payer: Self-pay | Admitting: Gastroenterology

## 2023-04-05 ENCOUNTER — Other Ambulatory Visit (INDEPENDENT_AMBULATORY_CARE_PROVIDER_SITE_OTHER)

## 2023-04-05 ENCOUNTER — Ambulatory Visit: Payer: Medicare Other | Admitting: Gastroenterology

## 2023-04-05 VITALS — BP 130/88 | HR 107 | Ht 64.0 in | Wt 201.0 lb

## 2023-04-05 DIAGNOSIS — R1310 Dysphagia, unspecified: Secondary | ICD-10-CM | POA: Diagnosis not present

## 2023-04-05 DIAGNOSIS — R1319 Other dysphagia: Secondary | ICD-10-CM | POA: Diagnosis not present

## 2023-04-05 DIAGNOSIS — K219 Gastro-esophageal reflux disease without esophagitis: Secondary | ICD-10-CM

## 2023-04-05 DIAGNOSIS — K760 Fatty (change of) liver, not elsewhere classified: Secondary | ICD-10-CM

## 2023-04-05 DIAGNOSIS — R748 Abnormal levels of other serum enzymes: Secondary | ICD-10-CM

## 2023-04-05 LAB — IBC PANEL
Iron: 111 ug/dL (ref 42–145)
Saturation Ratios: 27.5 % (ref 20.0–50.0)
TIBC: 403.2 ug/dL (ref 250.0–450.0)
Transferrin: 288 mg/dL (ref 212.0–360.0)

## 2023-04-05 NOTE — Progress Notes (Addendum)
 Chief Complaint:dysphagia Primary GI Doctor: Dr. Charlanne  HPI:  Patient is a  68  year old female patient with past medical history of Graves, Sjogrens, GERD, and Behcetts disease, who presents for main complaint of esophageal dysphagia, elevated liver enzymes, and hepatic steatosis.    Interval History    Patient presents for main complaint of esophageal dysphagia and hepatic steatosis, accompanied by caretaker.     Patient presents for main complaint of esophageal dysphagia that has increased in severity over the last two years. She states it happens with both solids and liquids. It is easier to swallow thickened liquids. She has been choked on certain foods like noodles,  rice or bacon. No weight loss. Reports weight gain. Patient has GERD and currently taking pantoprazole  40 mg po twice daily. Last EGD 20 years ago, unable to recall results. She has been evaluated for choking and gagging at [facility] back in March of 2024.     Patient also has nausea and sometimes vomits about 3 times per week. She uses ondansetron  prn. She uses OTC Ibuprofen 200 mg 5 tablets twice daily and Tylenol  500 mg 6 tablets po daily.    She has irregular bowel movements, where some days she has several bowel movements daily and other times she has no bowel movements. No blood in stool. She uses a OTC colostrum supplement which helps with regulating her bowels, but she states she can't also drink a shake.   Lastly,  patient presents for follow-up on elevated liver enzymes and hepatic steatosis. She reports being told she had hepatic steatosis 6 mths ago. She recalls intermittently being taken off medications in past due to elevated LFT's. No alcohol  in over 20 years. No hx of hepatitis. She used cocaine 35 to 40 years ago.  Family history includes; SCC in father, prostate CA, sister with Parkinson's and hemochromatosis, alcohol  abuse, another sister with alcohol  abuse. Multiple family members with autoimmune disease.   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    Hip pain    HLA B27 (HLA B27 positive)    Hyperlipidemia    Hypertension    Knee pain    Memory changes    Morphea scleroderma    Neuromuscular disorder (HCC)    Raynauds    Pneumonia    Psoriatic arthritis (HCC)    seen in ears only   Raynaud's disease    Sjogren syndrome with dental involvement (HCC)     Past Surgical History:  Procedure Laterality Date   CYST REMOVAL HAND     left arm posterior   laproscopy     SALPINGECTOMY  1980   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 Take with a full glass of water on an empty stomach. 12 tablet 1   amphetamine -dextroamphetamine  (ADDERALL) 10 MG tablet Take 1 tablet (10 mg total) by mouth 2 (two) times daily with a meal. 60 tablet 0   atorvastatin  (LIPITOR) 40 MG tablet TAKE ONE TABLET BY MOUTH ONE TIME DAILY 90 tablet 0   busPIRone  (BUSPAR ) 10 MG tablet Take 1 tablet (10 mg total) by mouth 3 (three) times daily. 90 tablet 2    Calcium  Carb-Cholecalciferol  (CALCIUM  1000 + D) 1000-20 MG-MCG TABS Take 1 tablet by mouth daily. 30 tablet 2   cetirizine  (ZYRTEC  ALLERGY) 10 MG tablet Take 1 tablet (10 mg total) by mouth daily. 90 tablet 1   clotrimazole  (LOTRIMIN ) 1 % cream Apply 1 Application topically 2 (two) times daily. To right underarm 30 g 0   colchicine  0.6 MG tablet Take 1 tablet (0.6 mg total) by mouth daily. 90 tablet 3   doxepin  (SINEQUAN ) 10 MG/ML solution Take 0.3 mLs (3 mg total) by mouth at bedtime. 118 mL 0   fluticasone  (FLONASE ) 50 MCG/ACT nasal spray USE 2 SPRAYS IN EACH NOSTRIL ONCE TIME DAILY 16 mL 6   HYDROcodone -acetaminophen  (NORCO/VICODIN) 5-325 MG tablet Take 1 tablet by mouth every 6 (six) hours as needed for moderate pain (pain score 4-6). Prescribed by pain clinic     levothyroxine  (SYNTHROID ) 137 MCG tablet Take 1 tablet (137 mcg total) by mouth daily before breakfast. 90 tablet 0   lidocaine  (XYLOCAINE ) 2 % solution Swish and spit 15mL no more than every 4 hours for oral ulcer pain. 100 mL 3   methocarbamol  (ROBAXIN ) 500 MG tablet TAKE ONE TABLET BY MOUTH EVERY 6 HOURS AS NEEDED FOR MUSCLE SPASM 40 tablet 1   omeprazole  (PRILOSEC) 40 MG capsule Take 1 capsule (40 mg total) by mouth in the morning and at bedtime. 180 capsule 3   ondansetron  (ZOFRAN ) 4 MG tablet TAKE ONE TABLET BY MOUTH EVERY 8 HOURS AS NEEDED FOR NAUSEA AND/OR VOMITING 20 tablet 0   PARoxetine  (PAXIL ) 40 MG tablet Take 1 tablet (40 mg total) by mouth daily. 90 tablet 1   QUEtiapine  (SEROQUEL ) 400 MG tablet Take 1 tablet (400 mg total) by mouth at bedtime. 90 tablet 1   RESTASIS  0.05 % ophthalmic emulsion INSTILL ONE DROP INTO EACH EYE TWICE DAILY 60 each 3   rizatriptan  (MAXALT ) 10 MG tablet Take 1 tablet (10 mg total) by mouth as needed for migraine. Khizar Fiorella repeat in 2 hours if needed 30 tablet 2   valACYclovir  (VALTREX ) 500 MG tablet TAKE ONE TABLET BY MOUTH TWICE A DAY FOR 7 DAYS 30 tablet 2   verapamil  (CALAN -SR) 120 MG CR tablet  TAKE ONE TABLET BY MOUTH ONE TIME DAILY AT BEDTIME 90 tablet 1   No current facility-administered medications for this visit.    Allergies as of 04/05/2023 - Review Complete 04/05/2023  Allergen Reaction Noted   Iodinated contrast media Anaphylaxis 05/11/2017   Prohance [gadoteridol] Anaphylaxis 01/04/2020   Ioversol  12/11/2022   Ptu [propylthiouracil]  01/02/2020   Sulfa antibiotics Hives and Other (  See Comments) 05/11/2017   Tapazole [methimazole]  01/02/2020   Latex Hives and Rash 01/02/2020    Family History  Problem Relation Age of Onset   Heart disease Mother    Congestive Heart Failure Mother    Dementia Mother    Heart disease Father    Heart attack Father    Dementia Sister    Hemachromatosis Sister    Parkinson's disease Sister    Allergies Son    Healthy Son    Colon cancer Neg Hx    Colon polyps Neg Hx    Esophageal cancer Neg Hx    Stomach cancer Neg Hx    Rectal cancer Neg Hx    BRCA 1/2 Neg Hx    Breast cancer Neg Hx     Review of Systems:    Constitutional: No weight loss, fever, chills, weakness or fatigue HEENT: Eyes: No change in vision               Ears, Nose, Throat:  No change in hearing or congestion Skin: No rash or itching Cardiovascular: No chest pain, chest pressure or palpitations   Respiratory: No SOB or cough Gastrointestinal: See HPI and otherwise negative Genitourinary: No dysuria or change in urinary frequency Neurological: No headache, dizziness or syncope Musculoskeletal: No new muscle or joint pain Hematologic: No bleeding or bruising Psychiatric: No history of depression or anxiety    Physical Exam:  Vital signs: BP 130/88   Pulse (!) 107   Ht 5' 4 (1.626 m)   Wt 201 lb (91.2 kg)   BMI 34.50 kg/m   Constitutional:   Pleasant  female appears to be in NAD, Well developed, Well nourished, alert and cooperative Throat: Oral cavity and pharynx without inflammation, notes lesions in mouth Respiratory: Respirations even  and unlabored. Lungs clear to auscultation bilaterally.   No wheezes, crackles, or rhonchi.  Cardiovascular: Normal S1, S2. Regular rate and rhythm. No peripheral edema, cyanosis or pallor.  Gastrointestinal:  Soft, nondistended, nontender. Obese. No rebound or guarding. Hypoactive bowel sounds. No appreciable masses or hepatomegaly. Rectal:  Not performed.  Msk:  patient constantly moving hands and/or legs throughout whole appt Neurologic:  Alert and  oriented x4;  grossly normal neurologically.  Skin:   Dry and intact without significant lesions or rashes. Psychiatric: Oriented to person, place and time. Demonstrates good judgement and reason without abnormal affect or behaviors.  RELEVANT LABS AND IMAGING: CBC    Latest Ref Rng & Units 03/22/2023   11:59 AM 11/09/2022    1:59 PM 08/10/2022    4:37 PM  CBC  WBC 3.4 - 10.8 x10E3/uL 5.6  10.2  8.5   Hemoglobin 11.1 - 15.9 g/dL 85.8  86.8  87.2   Hematocrit 34.0 - 46.6 % 41.7  38.7  39.2   Platelets 150 - 450 x10E3/uL 297  291  317      CMP     Latest Ref Rng & Units 03/22/2023   11:59 AM 02/15/2023    2:08 PM 11/09/2022    1:59 PM  CMP  Glucose 70 - 99 mg/dL 889  85  881   BUN 8 - 27 mg/dL 18  16  22    Creatinine 0.57 - 1.00 mg/dL 9.15  9.15  8.98   Sodium 134 - 144 mmol/L 143  140  139   Potassium 3.5 - 5.2 mmol/L 4.6  4.4  3.8   Chloride 96 - 106 mmol/L 101  103  103   CO2  20 - 29 mmol/L 27  23  19    Calcium  8.7 - 10.3 mg/dL 9.8  9.3  9.6   Total Protein 6.0 - 8.5 g/dL 6.9  6.7  6.9   Total Bilirubin 0.0 - 1.2 mg/dL 0.3  0.2  0.4   Alkaline Phos 44 - 121 IU/L 137  161  157   AST 0 - 40 IU/L 35  28  28   ALT 0 - 32 IU/L 44  32  35      Lab Results  Component Value Date   TSH 6.290 (H) 03/22/2023  03/22/23 labs show: Alk phosp 150>157>161>137, ALT 24>35>32>44, Normal Total Bili and AST, Gamma GT 176, PLT 297  09/01/2022 CT Abdomen Pelvis WO IV Contrast IMPRESSION:  1. Moderate fecal retention.  2.  Hepatic steatosis.   3.  Small to moderate-sized hiatal hernia.    08/18/22 echo- Left ventricular ejection fraction, by estimation, is 60 to 65%.  04/04/20 colonoscopy with Dr. Aneita The entire examined colon is normal on direct and retroflexion views. No specimens collected. Repeat in 10 years. 3/6/ 2024 FL ESOPHAGUS BARIUM SWALLOW WITH VIDEO AND SPEECH  Assessment: Encounter Diagnoses  Name Primary?   Hepatic steatosis Yes   Gastroesophageal reflux disease, unspecified whether esophagitis present    Esophageal dysphagia    Elevated serum GGT level       68 year old female patient that presents for evaluation of hepatic steatosis and elevated liver enzymes. 03/22/23 labs show: Alk phosp 150>157>161>137, ALT 24>35>32>44, Normal Total Bili and AST, Gamma GT 176, PLT 297. History of autoimmune disease, will check lab work to rule out autoimmune hepatitis. Also r/o viral- Hep B and C negative. Needs hepatitis B series. Will check for hepatitis A. Also check for genetic disorders. 08/2022 CT scan ABD/pelvis shows hepatic steatosis, no splenomegaly. No ascites. We discussed dietary changes, physical exercise, and weight loss.  For the dysphagia will proceed with upper GI endoscopy with possible dilatation to rule out stricture, web, or esophagitis. Swallow study with no additional penetration and no aspiration. Per report given that swallow function is grossly normal, there is concern for possible esophageal component contributing to symptoms. If negative exam make consider esophageal manometry to rule out motility disorder.   Plan: -Discontinue Tylenol  use -Recommend pt receive hepatitis B series with PCP as scheduled -Order Abd US  RUQ liver (HCC screening) -check ANA, AMA, Anti-smooth muscle antibody, Hepatitis A IgG, ferritin, TIBC,  Alpha 1 antitrypsin, ceruloplasmin, tTG, total IgA, IgG - Schedule EGD in [facility] with Dr. Charlanne with possible dilatation. The risks and benefits of EGD with possible biopsies and  esophageal dilation were discussed with the patient who agrees to proceed.  Thank you for the courtesy of this consult. Please call me with any questions or concerns.   Jodi Gruen, FNP-C [facility] 04/05/2023, 2:51 PM  Cc: Marlee Lynwood NOVAK, MD

## 2023-04-05 NOTE — Patient Instructions (Signed)
 Discontinue Tylenol No alcohol use  Your provider has requested that you go to the basement level for lab work before leaving today. Press "B" on the elevator. The lab is located at the first door on the left as you exit the elevator.  Due to recent changes in healthcare laws, you may see the results of your imaging and laboratory studies on MyChart before your provider has had a chance to review them.  We understand that in some cases there may be results that are confusing or concerning to you. Not all laboratory results come back in the same time frame and the provider may be waiting for multiple results in order to interpret others.  Please give [place] 48 hours in order for your provider to thoroughly review all the results before contacting the office for clarification of your results.    You have been scheduled for an abdominal ultrasound at [facility] (1st floor of hospital) on Thursday, 04/07/23 at 10:30am. Please arrive 15 minutes prior to your appointment for registration. Make certain not to have anything to eat or drink 6 hours prior to your appointment. Should you need to reschedule your appointment, please contact radiology at [phone]. This test typically takes about 30 minutes to perform.   You have been scheduled for an endoscopy. Please follow written instructions given to you at your visit today.  If you use inhalers (even only as needed), please bring them with you on the day of your procedure.  If you take any of the following medications, they will need to be adjusted prior to your procedure:   DO NOT TAKE 7 DAYS PRIOR TO TEST- Trulicity (dulaglutide) Ozempic, Wegovy (semaglutide) Mounjaro (tirzepatide) Bydureon Bcise (exanatide extended release)  DO NOT TAKE 1 DAY PRIOR TO YOUR TEST Rybelsus (semaglutide) Adlyxin (lixisenatide) Victoza (liraglutide) Byetta  (exanatide) _______________________________________________________________________  _______________________________________________________  If your blood pressure at your visit was 140/90 or greater, please contact your primary care physician to follow up on this.  _______________________________________________________  If you are age 68 or older, your body mass index should be between 23-30. Your Body mass index is 34.5 kg/m. If this is out of the aforementioned range listed, please consider follow up with your Primary Care Provider.  If you are age 68 or younger, your body mass index should be between 19-25. Your Body mass index is 34.5 kg/m. If this is out of the aformentioned range listed, please consider follow up with your Primary Care Provider.   ________________________________________________________  The [place] [facility] providers would like to encourage you to use [facility] to communicate with providers for non-urgent requests or questions.  Due to long hold times on the telephone, sending your provider a message by [facility] may be a faster and more efficient way to get a response.  Please allow 48 business hours for a response.  Please remember that this is for non-urgent requests.  _______________________________________________________

## 2023-04-07 ENCOUNTER — Ambulatory Visit (HOSPITAL_COMMUNITY): Payer: PRIVATE HEALTH INSURANCE

## 2023-04-07 LAB — CERULOPLASMIN: Ceruloplasmin: 24 mg/dL (ref 14–48)

## 2023-04-07 LAB — ANTI-SMOOTH MUSCLE ANTIBODY, IGG: Actin (Smooth Muscle) Antibody (IGG): <20 U (ref <20)

## 2023-04-07 LAB — IGG: IgG (Immunoglobin G), Serum: 676 mg/dL (ref 600–1540)

## 2023-04-07 LAB — TISSUE TRANSGLUTAMINASE ABS,IGG,IGA
(tTG) Ab, IgA: <1 U/mL
(tTG) Ab, IgG: <1 U/mL

## 2023-04-07 LAB — HEPATITIS A ANTIBODY, TOTAL: Hepatitis A AB,Total: REACTIVE — AB

## 2023-04-07 LAB — ALPHA-1-ANTITRYPSIN: A-1 Antitrypsin, Ser: 158 mg/dL (ref 83–199)

## 2023-04-07 LAB — ANA: Anti Nuclear Antibody (ANA): NEGATIVE

## 2023-04-07 LAB — IGA: Immunoglobulin A: 163 mg/dL (ref 70–320)

## 2023-04-08 ENCOUNTER — Telehealth: Payer: Self-pay

## 2023-04-08 NOTE — Progress Notes (Signed)
   Telephone encounter was:  Successful.  Complex Care Management Note Care Guide Note  04/08/2023 Name: Araceli Arango MRN: [id number] DOB: Oct 20, 1955  Dare Spillman is a 68 y.o. year old female who is a primary care patient of Alicia Amel, MD . The community resource team was consulted for assistance with Transportation Needs , Food Insecurity, and Financial Difficulties related to [employer] and Housing   SDOH screenings and interventions completed:  Yes  Social Drivers of Health From This Encounter   Food Insecurity: No Food Insecurity (04/08/2023)   Hunger Vital Sign    Worried About Running Out of Food in the Last Year: Never true    Ran Out of Food in the Last Year: Never true  Financial Resource Strain: High Risk (04/08/2023)   Overall Financial Resource Strain (CARDIA)    Difficulty of Paying Living Expenses: Very hard  Transportation Needs: Unmet Transportation Needs (04/08/2023)   PRAPARE - Transportation    Lack of Transportation (Medical): Yes    Lack of Transportation (Non-Medical): Yes  Utilities: Not At Risk (04/08/2023)   Utilities    Threatened with loss of utilities: No    SDOH Interventions Today    Flowsheet Row Most Recent Value  SDOH Interventions   Transportation Interventions [id number] Referral, Community Resources Provided  Utilities Interventions Community Resources Provided, [id number] Referral  Financial Strain Interventions Community Resources Provided, [id number] Referral        Care guide performed the following interventions: Patient provided with information about care guide support team and interviewed to confirm resource needs.Pt is having financial strain and cant afford to buy food and pay all of her bills. NCCare 360 Refferals for housing, food, financial and transportation. I will mail and email resources, refferal for One step further referral and MOW   Follow Up Plan:  No further follow up planned at this time. The patient has been  provided with needed resources.  Encounter Outcome:  Patient Visit Completed    Lenard Forth [facility]  [facility] Guide, Phone: [phone] Fax: [phone] Website: [email or web address]

## 2023-04-11 ENCOUNTER — Encounter: Payer: Self-pay | Admitting: Student

## 2023-04-11 ENCOUNTER — Other Ambulatory Visit: Payer: Self-pay

## 2023-04-11 DIAGNOSIS — K760 Fatty (change of) liver, not elsewhere classified: Secondary | ICD-10-CM

## 2023-04-11 DIAGNOSIS — R748 Abnormal levels of other serum enzymes: Secondary | ICD-10-CM

## 2023-04-14 ENCOUNTER — Encounter: Payer: Self-pay | Admitting: Student

## 2023-04-15 ENCOUNTER — Encounter: Payer: Self-pay | Admitting: Student

## 2023-04-17 ENCOUNTER — Other Ambulatory Visit: Payer: Self-pay | Admitting: Orthopaedic Surgery

## 2023-04-18 ENCOUNTER — Other Ambulatory Visit: Payer: Self-pay | Admitting: Student

## 2023-04-18 ENCOUNTER — Telehealth: Payer: Self-pay

## 2023-04-18 ENCOUNTER — Other Ambulatory Visit: Payer: Self-pay

## 2023-04-18 DIAGNOSIS — R110 Nausea: Secondary | ICD-10-CM

## 2023-04-18 MED ORDER — CLOTRIMAZOLE 1 % EX CREA: 1.0000 | TOPICAL_CREAM | Freq: Two times a day (BID) | CUTANEOUS | 0 refills | Status: DC

## 2023-04-18 NOTE — Telephone Encounter (Signed)
-----   Message from [facility] Jasmine December S sent at 04/15/2023  1:14 PM EDT ----- Regarding: FW: Lumbar MRI Hey!  Please read Dr Delaney Meigs message below.  Thanks, Melvenia Beam ----- Message ----- From: Shelby Mattocks, DO Sent: 04/15/2023  12:32 PM EDT To: Sunday Spillers, CMA Subject: Lumbar MRI                                     Would you be able to assist with getting her lumbar MRI scheduled at [facility]? Unsure if that'd be you or red team that assist. Please send to them if they would be necessary. Dr. Marisue Humble (red team) patient.

## 2023-04-18 NOTE — Telephone Encounter (Signed)
 MRI has been scheduled at [facility], patient informed. Penni Bombard CMA

## 2023-04-19 ENCOUNTER — Ambulatory Visit (HOSPITAL_COMMUNITY): Payer: PRIVATE HEALTH INSURANCE

## 2023-04-19 ENCOUNTER — Ambulatory Visit (INDEPENDENT_AMBULATORY_CARE_PROVIDER_SITE_OTHER): Payer: Medicare Other | Admitting: Audiology

## 2023-04-20 ENCOUNTER — Other Ambulatory Visit: Payer: Self-pay | Admitting: Student

## 2023-04-21 ENCOUNTER — Other Ambulatory Visit: Payer: Self-pay | Admitting: Student

## 2023-04-21 MED ORDER — VERAPAMIL HCL ER 120 MG PO TBCR: 120.0000 mg | EXTENDED_RELEASE_TABLET | Freq: Every day | ORAL | 1 refills | Status: DC

## 2023-04-21 NOTE — Progress Notes (Signed)
 Rx request for lisinopril, but was d/c by PCP in January. Will refill verapamil

## 2023-04-22 ENCOUNTER — Telehealth (HOSPITAL_COMMUNITY): Payer: Medicare Other | Admitting: Student

## 2023-04-22 DIAGNOSIS — F3160 Bipolar disorder, current episode mixed, unspecified: Secondary | ICD-10-CM

## 2023-04-22 DIAGNOSIS — F411 Generalized anxiety disorder: Secondary | ICD-10-CM

## 2023-04-22 DIAGNOSIS — F909 Attention-deficit hyperactivity disorder, unspecified type: Secondary | ICD-10-CM

## 2023-04-22 MED ORDER — AMPHETAMINE-DEXTROAMPHETAMINE 10 MG PO TABS: 10.0000 mg | ORAL_TABLET | Freq: Two times a day (BID) | ORAL | 0 refills | Status: DC

## 2023-04-22 MED ORDER — QUETIAPINE FUMARATE 400 MG PO TABS: 400.0000 mg | ORAL_TABLET | Freq: Every day | ORAL | 1 refills | Status: DC

## 2023-04-22 MED ORDER — BUSPIRONE HCL 10 MG PO TABS
10.0000 mg | ORAL_TABLET | Freq: Three times a day (TID) | ORAL | 2 refills | Status: AC
Start: 2023-04-22 — End: ?

## 2023-04-22 MED ORDER — PAROXETINE HCL 40 MG PO TABS: 40.0000 mg | ORAL_TABLET | Freq: Every day | ORAL | 1 refills | Status: DC

## 2023-04-22 NOTE — Progress Notes (Signed)
 [facility] MD Outpatient Progress Note  Date of visit: 04/22/2023 Jodi Gilmore  MRN:  [id number]  Televisit via video: I connected with Jodi Gilmore on 04/22/2023 at 9:30 AM EST by a video enabled telemedicine application and verified that I am speaking with the correct person using two identifiers.  Location: Patient: Home Provider: Office   I discussed the limitations of evaluation and management by telemedicine and the availability of in person appointments. The patient expressed understanding and agreed to proceed.  I discussed the assessment and treatment plan with the patient. The patient was provided an opportunity to ask questions and all were answered. The patient agreed with the plan and demonstrated an understanding of the instructions.   The patient was advised to call back or seek an in-person evaluation if the symptoms worsen or if the condition fails to improve as anticipated.  Assessment:  Jodi Gilmore presents for follow-up evaluation.  Today the patient feels that she is doing fairly well.  She does not desire any medication changes.  Continue current regimen as is.  Identifying Information: Jodi Gilmore is a 68 y.o. y.o. female with a history of BPAD, GAD, and ADHD who is an established patient with [facility] for management of anxiety and depression.   Plan:  # Bipolar disorder, GAD Interventions: -- Continue Paxil 40 mg daily - Continue buspirone 10 mg 3 times daily -- Continue Seroquel 400 mg at bedtime  # ADHD -- Continue Adderall 10 mg BID  Patient was given contact information for behavioral health clinic and was instructed to call 911 for emergencies.   Subjective:  Chief Complaint:  Chief Complaint  Patient presents with   Follow-up    Interval History:  The patient reports occasional interpersonal issues with her son, who she feels does not act appropriately at times.  She discusses her medical problems in detail and describes  her current workup.  She reports regular sleep and appetite.  She denies experiencing depression or any suicidal thoughts.  Visit Diagnosis:    ICD-10-CM   1. Bipolar affective disorder, current episode mixed, current episode severity unspecified (HCC)  F31.60 QUEtiapine (SEROQUEL) 400 MG tablet    PARoxetine (PAXIL) 40 MG tablet    2. Generalized anxiety disorder  F41.1 PARoxetine (PAXIL) 40 MG tablet    busPIRone (BUSPAR) 10 MG tablet    3. Attention deficit hyperactivity disorder (ADHD), unspecified ADHD type  F90.9 amphetamine-dextroamphetamine (ADDERALL) 10 MG tablet    amphetamine-dextroamphetamine (ADDERALL) 10 MG tablet    amphetamine-dextroamphetamine (ADDERALL) 10 MG tablet       Past Psychiatric History:  Previous admit 40 years ago is [place] for severe depression. Patient denies prior suicide attempts or self-harm.   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    Hip  pain    HLA B27 (HLA B27 positive)    Hyperlipidemia    Hypertension    Knee pain    Memory changes    Morphea scleroderma    Neuromuscular disorder (HCC)    Raynauds    Pneumonia    Psoriatic arthritis (HCC)    seen in ears only   Raynaud's disease    Sjogren syndrome with dental involvement (HCC)     Past Surgical History:  Procedure Laterality Date   CYST REMOVAL HAND     left arm posterior   laproscopy     SALPINGECTOMY  1980   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    Procedure: LEFT TOTAL HIP ARTHROPLASTY ANTERIOR APPROACH;  Surgeon: Kathryne Hitch, MD;  Location: [facility];  Service: Orthopedics;  Laterality: Left;   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      Family Psychiatric History: none pertinent  Family History:  Family History  Problem Relation Age of Onset   Heart disease Mother    Congestive Heart Failure Mother    Dementia Mother    Heart disease Father    Heart attack Father    Dementia Sister    Hemachromatosis Sister    Parkinson's disease Sister    Allergies Son    Healthy Son    Colon cancer Neg Hx    Colon polyps Neg Hx    Esophageal cancer Neg Hx    Stomach cancer Neg Hx    Rectal cancer Neg Hx    BRCA 1/2 Neg Hx    Breast cancer Neg Hx     Social History:  Social History   Socioeconomic History   Marital status: Single    Spouse name: Not on file   Number of children: Not on file   Years of education: Not on file   Highest education level: Some college, no degree  Occupational History   Occupation: Unemployed  Tobacco Use   Smoking status: Former    Passive exposure: Past   Smokeless tobacco: Never  Vaping Use   Vaping status: Never Used  Substance and Sexual Activity   Alcohol use: Not Currently   Drug use: Never   Sexual activity: Not Currently  Other Topics Concern   Not on file  Social History Narrative   Not on file   Social Drivers of Health   Financial Resource Strain: High Risk (04/08/2023)   Overall Financial Resource Strain (CARDIA)    Difficulty of Paying Living Expenses: Very hard  Food Insecurity: No Food Insecurity (04/08/2023)   Hunger Vital Sign    Worried About Running Out of Food in the Last Year: Never true    Ran Out of Food in the Last Year: Never true  Transportation Needs: Unmet Transportation Needs (04/08/2023)   PRAPARE - Transportation    Lack of Transportation (Medical): Yes    Lack of Transportation (Non-Medical): Yes  Physical Activity: Inactive (12/23/2022)   Exercise Vital  Sign    Days of Exercise per Week: 0 days    Minutes of Exercise per Session: 0 min  Stress: Stress Concern Present (12/23/2022)   [employer] of Occupational Health - Occupational Stress Questionnaire    Feeling of Stress : To some extent  Social Connections: Unknown (12/23/2022)   Social Connection and Isolation Panel [NHANES]    Frequency of Communication with Friends and Family: Twice a week    Frequency of Social Gatherings with Friends and Family: Twice a week    Attends Religious Services: Never  Active Member of Clubs or Organizations: No    Attends Banker Meetings: Not on file    Marital Status: Patient declined    Allergies:  Allergies  Allergen Reactions   Iodinated Contrast Media Anaphylaxis   Prohance [Gadoteridol] Anaphylaxis   Ioversol    Ptu [Propylthiouracil]    Sulfa Antibiotics Hives and Other (See Comments)    Unknown reaction, family history   Tapazole [Methimazole]    Latex Hives and Rash    Current Medications: Current Outpatient Medications  Medication Sig Dispense Refill   alendronate (FOSAMAX) 70 MG tablet Take 1 tablet (70 mg total) by mouth every 7 (seven) days. Take with a full glass of water on an empty stomach. 12 tablet 1   [START ON 04/29/2023] amphetamine-dextroamphetamine (ADDERALL) 10 MG tablet Take 1 tablet (10 mg total) by mouth 2 (two) times daily with a meal. 60 tablet 0   [START ON 05/29/2023] amphetamine-dextroamphetamine (ADDERALL) 10 MG tablet Take 1 tablet (10 mg total) by mouth 2 (two) times daily with a meal. 60 tablet 0   [START ON 06/29/2023] amphetamine-dextroamphetamine (ADDERALL) 10 MG tablet Take 1 tablet (10 mg total) by mouth 2 (two) times daily with a meal. 60 tablet 0   atorvastatin (LIPITOR) 40 MG tablet TAKE ONE TABLET BY MOUTH ONE TIME DAILY 90 tablet 0   busPIRone (BUSPAR) 10 MG tablet Take 1 tablet (10 mg total) by mouth 3 (three) times daily. 90 tablet 2   Calcium Carb-Cholecalciferol (CALCIUM 1000  + D) 1000-20 MG-MCG TABS Take 1 tablet by mouth daily. 30 tablet 2   cetirizine (ZYRTEC ALLERGY) 10 MG tablet Take 1 tablet (10 mg total) by mouth daily. 90 tablet 1   clotrimazole (LOTRIMIN) 1 % cream Apply 1 Application topically 2 (two) times daily. To right underarm 30 g 0   colchicine 0.6 MG tablet Take 1 tablet (0.6 mg total) by mouth daily. 90 tablet 3   doxepin (SINEQUAN) 10 MG/ML solution Take 0.3 mLs (3 mg total) by mouth at bedtime. 118 mL 0   fluticasone (FLONASE) 50 MCG/ACT nasal spray USE 2 SPRAYS IN EACH NOSTRIL ONCE TIME DAILY 16 mL 6   HYDROcodone-acetaminophen (NORCO/VICODIN) 5-325 MG tablet Take 1 tablet by mouth every 6 (six) hours as needed for moderate pain (pain score 4-6). Prescribed by pain clinic     levothyroxine (SYNTHROID) 137 MCG tablet Take 1 tablet (137 mcg total) by mouth daily before breakfast. 90 tablet 0   lidocaine (XYLOCAINE) 2 % solution Swish and spit 15mL no more than every 4 hours for oral ulcer pain. 100 mL 3   methocarbamol (ROBAXIN) 500 MG tablet TAKE ONE TABLET BY MOUTH EVERY 6 HOURS AS NEEDED FOR MUSCLE SPASM 40 tablet 1   omeprazole (PRILOSEC) 40 MG capsule Take 1 capsule (40 mg total) by mouth in the morning and at bedtime. 180 capsule 3   ondansetron (ZOFRAN) 4 MG tablet TAKE ONE TABLET BY MOUTH EVERY 8 HOURS AS NEEDED FOR NAUSEA AND/OR VOMITING 20 tablet 0   PARoxetine (PAXIL) 40 MG tablet Take 1 tablet (40 mg total) by mouth daily. 90 tablet 1   QUEtiapine (SEROQUEL) 400 MG tablet Take 1 tablet (400 mg total) by mouth at bedtime. 90 tablet 1   RESTASIS 0.05 % ophthalmic emulsion INSTILL ONE DROP INTO EACH EYE TWICE DAILY 60 each 3   rizatriptan (MAXALT) 10 MG tablet Take 1 tablet (10 mg total) by mouth as needed for migraine. May repeat in 2 hours if needed  30 tablet 2   valACYclovir (VALTREX) 500 MG tablet TAKE ONE TABLET BY MOUTH TWICE A DAY FOR 7 DAYS 30 tablet 2   verapamil (CALAN-SR) 120 MG CR tablet Take 1 tablet (120 mg total) by mouth  daily. 90 tablet 1   No current facility-administered medications for this visit.     Objective:  Psychiatric Specialty Exam: Physical Exam Constitutional:      Appearance: the patient is not toxic-appearing.  Pulmonary:     Effort: Pulmonary effort is normal.  Neurological:     General: No focal deficit present.     Mental Status: the patient is alert and oriented to person, place, and time.   Review of Systems  Respiratory:  Negative for shortness of breath.   Cardiovascular:  Negative for chest pain.  Gastrointestinal:  Negative for abdominal pain, constipation, diarrhea, nausea and vomiting.  Neurological:  Negative for headaches.      There were no vitals taken for this visit.  General Appearance: Fairly Groomed  Eye Contact:  Good  Speech:  Clear and Coherent  Volume:  Normal  Mood:  Euthymic  Affect:  Congruent  Thought Process:  Coherent  Orientation:  Full (Time, Place, and Person)  Thought Content: Logical   Suicidal Thoughts:  No  Homicidal Thoughts:  No  Memory:  Immediate;   Good  Judgement:  fair  Insight:  fair  Psychomotor Activity:  Normal  Concentration:  Concentration: Good  Recall:  Good  Fund of Knowledge: Good  Language: Good  Akathisia:  No  Handed:    AIMS (if indicated): not done  Assets:  Communication Skills Desire for Improvement Financial Resources/Insurance Housing Leisure Time Physical Health  ADL's:  Intact  Cognition: WNL  Sleep:  Fair     Metabolic Disorder Labs: Lab Results  Component Value Date   HGBA1C 5.4 11/26/2021   No results found for: "PROLACTIN" Lab Results  Component Value Date   CHOL 272 (H) 02/12/2020   TRIG 145 02/12/2020   HDL 71 02/12/2020   CHOLHDL 3.8 02/12/2020   LDLCALC 175 (H) 02/12/2020   Lab Results  Component Value Date   TSH 6.290 (H) 03/22/2023   TSH 5.250 (H) 02/15/2023    Therapeutic Level Labs: No results found for: "LITHIUM" No results found for: "VALPROATE" No results  found for: "CBMZ"  Screenings: GAD-7    Flowsheet Row Video Visit from 08/26/2021 in [facility] Video Visit from 06/24/2021 in [facility] Video Visit from 04/22/2021 in [facility] Video Visit from 03/20/2021 in [facility] Video Visit from 02[facility]19/2021 in [facility]  Total GAD-7 Score 9 10 14 13 11       PHQ2-9    Flowsheet Row Office Visit from 03/22/2023 in [facility] - A Dept Of [place]. [facility] Office Visit from 08/10/2022 in [facility] - A Dept Of [place]. [facility] Office Visit from 06[facility]29/2022 in [facility] - A Dept Of [place] Bridegroom. [facility] Clinical Support from 03/09/2022 in [facility] - A Dept Of [place] Bridegroom. [facility] Office Visit from 11/26/2021 in [facility] - A Dept Of [place]. [facility]  PHQ-2 Total Score 5 2 6 2 4   PHQ-9 Total Score 22 15 19 15 13       Flowsheet Row ED  from 12/11/2022 in [facility]) Video Visit from 08/26/2021 in [facility] Video Visit from 06/24/2021 in [facility]  C-SSRS RISK CATEGORY No Risk Low Risk Low Risk       Collaboration of Care: none  A total of 30 minutes was spent involved in face to face clinical care, chart review, documentation.   Carlyn Reichert, MD 04/22/2023, 9:57 AM

## 2023-04-26 ENCOUNTER — Telehealth: Payer: Self-pay

## 2023-04-26 ENCOUNTER — Ambulatory Visit (HOSPITAL_COMMUNITY)

## 2023-04-26 ENCOUNTER — Encounter (HOSPITAL_COMMUNITY): Payer: Self-pay

## 2023-04-26 DIAGNOSIS — R748 Abnormal levels of other serum enzymes: Secondary | ICD-10-CM

## 2023-04-26 DIAGNOSIS — K760 Fatty (change of) liver, not elsewhere classified: Secondary | ICD-10-CM

## 2023-04-26 DIAGNOSIS — K219 Gastro-esophageal reflux disease without esophagitis: Secondary | ICD-10-CM

## 2023-04-26 DIAGNOSIS — R1319 Other dysphagia: Secondary | ICD-10-CM

## 2023-04-26 NOTE — Telephone Encounter (Signed)
 I spoke to Jodi Gilmore and I advised her that I made an error in saving our scheduled appt.  I rescheduled her to Tuesday, June 17 @ 1:30pm.  I told her that I would be mailing her a copy of the new instructions.  She asked if it would also be in MyChart and I advised her yes.

## 2023-04-27 ENCOUNTER — Encounter: Payer: Self-pay | Admitting: Student

## 2023-04-27 ENCOUNTER — Ambulatory Visit: Payer: Self-pay

## 2023-04-27 ENCOUNTER — Other Ambulatory Visit: Payer: Self-pay | Admitting: Student

## 2023-04-27 DIAGNOSIS — R110 Nausea: Secondary | ICD-10-CM

## 2023-04-29 ENCOUNTER — Other Ambulatory Visit: Payer: Self-pay | Admitting: Student

## 2023-04-29 DIAGNOSIS — R110 Nausea: Secondary | ICD-10-CM

## 2023-04-30 ENCOUNTER — Encounter: Payer: Self-pay | Admitting: Student

## 2023-05-04 ENCOUNTER — Encounter: Admitting: Student

## 2023-05-12 ENCOUNTER — Other Ambulatory Visit: Payer: Self-pay | Admitting: Orthopaedic Surgery

## 2023-05-13 ENCOUNTER — Other Ambulatory Visit: Payer: Self-pay | Admitting: Student

## 2023-05-17 ENCOUNTER — Other Ambulatory Visit: Payer: Self-pay | Admitting: Student

## 2023-05-23 ENCOUNTER — Other Ambulatory Visit: Payer: Self-pay | Admitting: Student

## 2023-05-24 ENCOUNTER — Ambulatory Visit (INDEPENDENT_AMBULATORY_CARE_PROVIDER_SITE_OTHER): Admitting: Student

## 2023-05-24 VITALS — BP 112/62 | HR 101 | Ht 64.0 in | Wt 203.4 lb

## 2023-05-24 DIAGNOSIS — E559 Vitamin D deficiency, unspecified: Secondary | ICD-10-CM

## 2023-05-24 DIAGNOSIS — R400 Somnolence: Secondary | ICD-10-CM | POA: Diagnosis not present

## 2023-05-24 DIAGNOSIS — R42 Dizziness and giddiness: Secondary | ICD-10-CM

## 2023-05-24 DIAGNOSIS — K760 Fatty (change of) liver, not elsewhere classified: Secondary | ICD-10-CM

## 2023-05-24 DIAGNOSIS — E782 Mixed hyperlipidemia: Secondary | ICD-10-CM | POA: Diagnosis not present

## 2023-05-24 DIAGNOSIS — G43109 Migraine with aura, not intractable, without status migrainosus: Secondary | ICD-10-CM | POA: Diagnosis present

## 2023-05-24 DIAGNOSIS — E039 Hypothyroidism, unspecified: Secondary | ICD-10-CM

## 2023-05-24 NOTE — Progress Notes (Unsigned)
 Jodi Gilmore

## 2023-05-24 NOTE — Patient Instructions (Addendum)
 Loye Rumble to see you:  I am checking a CO level. Also referring you to neuro for headache. Would recommend following up with your landlord. ALSO if you can't resolve it internally, the [employer] may be able to help.   I will follow-up on your lumbar MRI.  I would recommend following through on your abdominal US .   We are checking a thyroid .  Also checking your calcium  and D3.   I will try to get you a home sleeep test rather than having to go to a center.    Hep B shot today.   Alexa Andrews, MD

## 2023-05-25 ENCOUNTER — Encounter: Payer: Self-pay | Admitting: Student

## 2023-05-26 NOTE — Assessment & Plan Note (Addendum)
 Previously well controlled with verapamil , now increasing frequency in the setting of life stressors. She requests follow-up with neurology locally which is certainly reasonable given concomitant FND. Has had recent head imaging.  - Referral made to [facility]  - Carboxyhemoglobin

## 2023-05-26 NOTE — Assessment & Plan Note (Signed)
Repeat TSH today

## 2023-05-26 NOTE — Assessment & Plan Note (Signed)
 Repeat lipid panel today.

## 2023-05-26 NOTE — Assessment & Plan Note (Addendum)
 Workup underway with GI. Requesting repeat LFTs today. - CMP ordered

## 2023-05-26 NOTE — Assessment & Plan Note (Signed)
 Difficulty getting to sleep center, asking about home sleep study. - Ordered home sleep study

## 2023-05-27 ENCOUNTER — Other Ambulatory Visit: Payer: Self-pay | Admitting: Student

## 2023-05-27 ENCOUNTER — Encounter: Payer: Self-pay | Admitting: Student

## 2023-05-27 ENCOUNTER — Ambulatory Visit: Payer: Self-pay | Admitting: Student

## 2023-05-27 DIAGNOSIS — R110 Nausea: Secondary | ICD-10-CM

## 2023-05-27 LAB — LIPID PANEL
Chol/HDL Ratio: 3.5 ratio (ref 0.0–4.4)
Cholesterol, Total: 167 mg/dL (ref 100–199)
HDL: 48 mg/dL (ref >39)
LDL Chol Calc (NIH): 81 mg/dL (ref 0–99)
Triglycerides: 232 mg/dL — ABNORMAL HIGH (ref 0–149)
VLDL Cholesterol Cal: 38 mg/dL (ref 5–40)

## 2023-05-27 LAB — COMPREHENSIVE METABOLIC PANEL WITH GFR
ALT: 38 IU/L — ABNORMAL HIGH (ref 0–32)
AST: 31 IU/L (ref 0–40)
Albumin: 4.6 g/dL (ref 3.9–4.9)
Alkaline Phosphatase: 158 IU/L — ABNORMAL HIGH (ref 44–121)
BUN/Creatinine Ratio: 22 (ref 12–28)
BUN: 21 mg/dL (ref 8–27)
Bilirubin Total: 0.2 mg/dL (ref 0.0–1.2)
CO2: 20 mmol/L (ref 20–29)
Calcium: 9.5 mg/dL (ref 8.7–10.3)
Chloride: 102 mmol/L (ref 96–106)
Creatinine, Ser: 0.94 mg/dL (ref 0.57–1.00)
Globulin, Total: 2.2 g/dL (ref 1.5–4.5)
Glucose: 96 mg/dL (ref 70–99)
Potassium: 4.1 mmol/L (ref 3.5–5.2)
Sodium: 140 mmol/L (ref 134–144)
Total Protein: 6.8 g/dL (ref 6.0–8.5)
eGFR: 67 mL/min/{1.73_m2} (ref >59)

## 2023-05-27 LAB — CARBON MONOXIDE, BLOOD (PERFORMED AT REF LAB): CARBON MONOXIDE, BLOOD: 2.4 % (ref 0.0–3.6)

## 2023-05-27 LAB — TSH: TSH: 3.39 u[IU]/mL (ref 0.450–4.500)

## 2023-05-27 LAB — VITAMIN D 25 HYDROXY (VIT D DEFICIENCY, FRACTURES): Vit D, 25-Hydroxy: 39.1 ng/mL (ref 30.0–100.0)

## 2023-05-29 ENCOUNTER — Encounter: Payer: Self-pay | Admitting: Student

## 2023-05-29 DIAGNOSIS — M810 Age-related osteoporosis without current pathological fracture: Secondary | ICD-10-CM

## 2023-05-30 ENCOUNTER — Encounter: Payer: Self-pay | Admitting: Student

## 2023-05-31 ENCOUNTER — Encounter: Payer: Self-pay | Admitting: Student

## 2023-05-31 MED ORDER — ALENDRONATE SODIUM 10 MG PO TABS: 10.0000 mg | ORAL_TABLET | Freq: Every day | ORAL | 3 refills | Status: DC

## 2023-06-03 ENCOUNTER — Encounter: Payer: Self-pay | Admitting: Student

## 2023-06-09 ENCOUNTER — Other Ambulatory Visit: Payer: Self-pay | Admitting: Student

## 2023-06-16 ENCOUNTER — Other Ambulatory Visit: Payer: Self-pay | Admitting: Orthopaedic Surgery

## 2023-06-16 ENCOUNTER — Other Ambulatory Visit: Payer: Self-pay | Admitting: Student

## 2023-06-17 ENCOUNTER — Other Ambulatory Visit: Payer: Self-pay | Admitting: Orthopaedic Surgery

## 2023-06-17 ENCOUNTER — Other Ambulatory Visit: Payer: Self-pay | Admitting: *Deleted

## 2023-06-17 ENCOUNTER — Telehealth: Payer: Self-pay

## 2023-06-17 DIAGNOSIS — M810 Age-related osteoporosis without current pathological fracture: Secondary | ICD-10-CM

## 2023-06-17 MED ORDER — RIZATRIPTAN BENZOATE 10 MG PO TABS: 10.0000 mg | ORAL_TABLET | ORAL | 2 refills | Status: DC | PRN

## 2023-06-17 MED ORDER — CALCIUM 1000 + D 1000-20 MG-MCG PO TABS: 1.0000 | ORAL_TABLET | Freq: Every day | ORAL | 2 refills | Status: DC

## 2023-06-17 NOTE — Telephone Encounter (Signed)
 [facility] calls nurse line in regards to Rizatriptan  prescription.   Pharmacist reports the max dose per day needs to be listed on the script.  She reports she will not be able to fill as sent in earlier.   Advised will forward to PCP.

## 2023-06-18 ENCOUNTER — Encounter: Payer: Self-pay | Admitting: Student

## 2023-06-20 ENCOUNTER — Ambulatory Visit (HOSPITAL_COMMUNITY)

## 2023-06-21 ENCOUNTER — Encounter: Payer: Self-pay | Admitting: *Deleted

## 2023-06-24 ENCOUNTER — Telehealth (INDEPENDENT_AMBULATORY_CARE_PROVIDER_SITE_OTHER): Payer: PRIVATE HEALTH INSURANCE | Admitting: Student

## 2023-06-24 DIAGNOSIS — F3160 Bipolar disorder, current episode mixed, unspecified: Secondary | ICD-10-CM

## 2023-06-24 DIAGNOSIS — F909 Attention-deficit hyperactivity disorder, unspecified type: Secondary | ICD-10-CM | POA: Diagnosis not present

## 2023-06-24 DIAGNOSIS — F411 Generalized anxiety disorder: Secondary | ICD-10-CM | POA: Diagnosis not present

## 2023-06-24 MED ORDER — AMPHETAMINE-DEXTROAMPHETAMINE 10 MG PO TABS: 10.0000 mg | ORAL_TABLET | Freq: Two times a day (BID) | ORAL | 0 refills | Status: DC

## 2023-06-24 MED ORDER — BUSPIRONE HCL 10 MG PO TABS: 10.0000 mg | ORAL_TABLET | Freq: Three times a day (TID) | ORAL | 2 refills | Status: DC

## 2023-06-24 MED ORDER — PAROXETINE HCL 40 MG PO TABS
40.0000 mg | ORAL_TABLET | Freq: Every day | ORAL | 1 refills | Status: DC
Start: 2023-06-24 — End: 2023-10-13

## 2023-06-24 MED ORDER — QUETIAPINE FUMARATE 400 MG PO TABS: 400.0000 mg | ORAL_TABLET | Freq: Every day | ORAL | 1 refills | Status: DC

## 2023-06-24 NOTE — Progress Notes (Signed)
 BH MD Outpatient Progress Note  Date of visit: 06/24/2023 Jodi Gilmore  MRN:  [id number]  Televisit via video: I connected with [name] on 06/24/2023 at 9:30 AM EST by a video enabled telemedicine application and verified that I am speaking with the correct person using two identifiers.  Location: Patient: Home Provider: Office   I discussed the limitations of evaluation and management by telemedicine and the availability of in person appointments. The patient expressed understanding and agreed to proceed.  I discussed the assessment and treatment plan with the patient. The patient was provided an opportunity to ask questions and all were answered. The patient agreed with the plan and demonstrated an understanding of the instructions.   The patient was advised to call back or seek an in-person evaluation if the symptoms worsen or if the condition fails to improve as anticipated.  Assessment:  Jodi Gilmore presents for follow-up evaluation.  The patient reports that she is doing well and does not desire any medication changes.  Patient reports stopping her Vicodin at the behest of another physician.  She reports some difficulties with this.  She was encouraged to stay off the medication if at all possible.  The patient will follow-up with a new resident physician on 8/22 due to routine resident transition.  This was discussed with the patient and all questions were answered.  Though the patient is typically seen virtually she agreed to come in person for this appointment.    Identifying Information: Jodi Gilmore is a 68 y.o. y.o. female with a history of BPAD, GAD, and ADHD who is an established patient with [facility] for management of anxiety and depression.   Plan:  # Bipolar disorder, GAD Interventions: -- Continue Paxil  40 mg daily - Continue buspirone  10 mg 3 times daily -- Continue Seroquel  400 mg at bedtime  # ADHD -- Continue Adderall 10 mg  BID  Patient was previously prescribed Vicodin but reports stopping this medication about a month ago.  Patient was given contact information for behavioral health clinic and was instructed to call 911 for emergencies.   Subjective:  Chief Complaint:  Chief Complaint  Patient presents with   Medication Refill    Interval History:  Patient discusses some frustrations with her relationship with her son.  She talks about her lack of trust in him for being her healthcare power of attorney.  She discusses her medical problems at length.  She reports brief episodes of what she calls manic symptoms.  The severity appears to be minimal and the duration is quite brief.  She reports feeling euphoric at certain points with feeling fading after an hour or so.  She denies experiencing any depression or thoughts of self-harm.  Visit Diagnosis:    ICD-10-CM   1. Bipolar affective disorder, current episode mixed, current episode severity unspecified (HCC)  F31.60 QUEtiapine  (SEROQUEL ) 400 MG tablet    PARoxetine  (PAXIL ) 40 MG tablet    2. Generalized anxiety disorder  F41.1 busPIRone  (BUSPAR ) 10 MG tablet    PARoxetine  (PAXIL ) 40 MG tablet    3. Attention deficit hyperactivity disorder (ADHD), unspecified ADHD type  F90.9 amphetamine -dextroamphetamine  (ADDERALL) 10 MG tablet    amphetamine -dextroamphetamine  (ADDERALL) 10 MG tablet       Past Psychiatric History:  Previous admit 40 years ago is [place]  for severe depression. Patient denies prior suicide attempts or self-harm.   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    Hip pain    HLA B27 (HLA B27 positive)    Hyperlipidemia    Hypertension    Knee pain    Memory changes    Morphea scleroderma    Neuromuscular disorder (HCC)    Raynauds    Pneumonia    Psoriatic arthritis (HCC)    seen in ears only   Raynaud's disease    Sjogren syndrome with dental involvement (HCC)     Past Surgical History:  Procedure Laterality Date   CYST REMOVAL HAND     left arm posterior   laproscopy     SALPINGECTOMY  1980   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   Procedure: LEFT TOTAL HIP ARTHROPLASTY ANTERIOR APPROACH;  Surgeon: Arnie Lao, MD;  Location: [facility];  Service: Orthopedics;  Laterality: Left;   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      Family Psychiatric History: none pertinent  Family History:  Family History  Problem Relation Age of Onset   Heart disease Mother    Congestive Heart Failure Mother    Dementia Mother    Heart disease Father    Heart attack Father    Dementia Sister    Hemachromatosis Sister    Parkinson's disease Sister    Allergies Son    Healthy Son    Colon cancer Neg Hx    Colon polyps Neg Hx    Esophageal cancer Neg Hx    Stomach cancer Neg Hx    Rectal cancer Neg Hx    BRCA 1/2 Neg Hx    Breast cancer Neg Hx     Social History:  Social History   Socioeconomic History   Marital status: Single    Spouse name: Not on file   Number of children: Not on file   Years of education: Not on file   Highest education level: Some college, no degree  Occupational History   Occupation: Unemployed  Tobacco Use   Smoking status: Former    Passive exposure: Past   Smokeless tobacco: Never  Vaping Use   Vaping status: Never Used  Substance and Sexual Activity   Alcohol  use: Not Currently   Drug use: Never   Sexual activity: Not Currently  Other Topics Concern    Not on file  Social History Narrative   Not on file   Social Drivers of Health   Financial Resource Strain: High Risk (04/08/2023)   Overall Financial Resource Strain (CARDIA)    Difficulty of Paying Living Expenses: Very hard  Food Insecurity: No Food Insecurity (04/08/2023)   Hunger Vital Sign    Worried About Running Out of Food in the Last Year: Never true    Ran Out of Food in the Last Year: Never true  Transportation Needs: Unmet Transportation Needs (04/08/2023)   PRAPARE - Transportation    Lack of  Transportation (Medical): Yes    Lack of Transportation (Non-Medical): Yes  Physical Activity: Unknown (12/23/2022)   Exercise Vital Sign    Days of Exercise per Week: 0 days    Minutes of Exercise per Session: Not on file  Recent Concern: Physical Activity - Inactive (12/23/2022)   Exercise Vital Sign    Days of Exercise per Week: 0 days    Minutes of Exercise per Session: 0 min  Stress: Stress Concern Present (12/23/2022)   [employer] of Occupational Health - Occupational Stress Questionnaire    Feeling of Stress : To some extent  Social Connections: Unknown (12/23/2022)   Social Connection and Isolation Panel    Frequency of Communication with Friends and Family: Twice a week    Frequency of Social Gatherings with Friends and Family: Twice a week    Attends Religious Services: Never    Database administrator or Organizations: No    Attends Engineer, structural: Not on file    Marital Status: Patient declined    Allergies:  Allergies  Allergen Reactions   Iodinated Contrast Media Anaphylaxis   Prohance [Gadoteridol] Anaphylaxis   Ioversol    Ptu [Propylthiouracil]    Sulfa Antibiotics Hives and Other (See Comments)    Unknown reaction, family history   Tapazole [Methimazole]    Latex Hives and Rash    Current Medications: Current Outpatient Medications  Medication Sig Dispense Refill   [START ON 07/29/2023] amphetamine -dextroamphetamine   (ADDERALL) 10 MG tablet Take 1 tablet (10 mg total) by mouth 2 (two) times daily with a meal. 60 tablet 0   [START ON 08/28/2023] amphetamine -dextroamphetamine  (ADDERALL) 10 MG tablet Take 1 tablet (10 mg total) by mouth 2 (two) times daily with a meal. 60 tablet 0   alendronate  (FOSAMAX ) 10 MG tablet Take 1 tablet (10 mg total) by mouth daily before breakfast. Take with a full glass of water on an empty stomach. 90 tablet 3   amphetamine -dextroamphetamine  (ADDERALL) 10 MG tablet Take 1 tablet (10 mg total) by mouth 2 (two) times daily with a meal. 60 tablet 0   amphetamine -dextroamphetamine  (ADDERALL) 10 MG tablet Take 1 tablet (10 mg total) by mouth 2 (two) times daily with a meal. 60 tablet 0   [START ON 06/29/2023] amphetamine -dextroamphetamine  (ADDERALL) 10 MG tablet Take 1 tablet (10 mg total) by mouth 2 (two) times daily with a meal. 60 tablet 0   atorvastatin  (LIPITOR) 40 MG tablet TAKE ONE TABLET BY MOUTH ONE TIME DAILY 90 tablet 0   busPIRone  (BUSPAR ) 10 MG tablet Take 1 tablet (10 mg total) by mouth 3 (three) times daily. 90 tablet 2   Calcium  Carb-Cholecalciferol  (CALCIUM  1000 + D) 1000-20 MG-MCG TABS Take 1 tablet by mouth daily. 30 tablet 2   cetirizine  (ZYRTEC  ALLERGY) 10 MG tablet Take 1 tablet (10 mg total) by mouth daily. 90 tablet 1   clotrimazole  (LOTRIMIN ) 1 % cream Apply 1 Application topically 2 (two) times daily. To right underarm 30 g 0   colchicine  0.6 MG tablet Take 1 tablet (0.6 mg total) by mouth daily. 90 tablet 3   fluticasone  (FLONASE ) 50 MCG/ACT nasal spray USE 2 SPRAYS IN EACH NOSTRIL ONCE TIME DAILY 16 mL 6   levothyroxine  (SYNTHROID ) 137 MCG tablet TAKE ONE TABLET BY MOUTH ONE TIME DAILY BEFORE BREAKFAST 90 tablet 0   lidocaine  (XYLOCAINE ) 2 % solution Swish and spit 15mL no more than every 4 hours for oral ulcer pain. 100 mL 3   methocarbamol  (ROBAXIN ) 500  MG tablet TAKE ONE TABLET BY MOUTH EVERY 6 HOURS AS NEEDED FOR MUSCLE SPASM 40 tablet 1   omeprazole  (PRILOSEC)  40 MG capsule Take 1 capsule (40 mg total) by mouth in the morning and at bedtime. 180 capsule 3   ondansetron  (ZOFRAN ) 4 MG tablet TAKE ONE TABLET BY MOUTH EVERY 8 HOURS AS NEEDED FOR NAUSEA AND/OR VOMITING 20 tablet 0   PARoxetine  (PAXIL ) 40 MG tablet Take 1 tablet (40 mg total) by mouth daily. 90 tablet 1   QUEtiapine  (SEROQUEL ) 400 MG tablet Take 1 tablet (400 mg total) by mouth at bedtime. 90 tablet 1   RESTASIS  0.05 % ophthalmic emulsion INSTILL ONE DROP INTO EACH EYE TWICE DAILY 60 each 3   rizatriptan  (MAXALT ) 10 MG tablet Take 1 tablet (10 mg total) by mouth as needed for migraine. May repeat in 2 hours if needed. Do not take more than THREE doses in one day. 30 tablet 2   valACYclovir  (VALTREX ) 500 MG tablet TAKE ONE TABLET BY MOUTH TWICE A DAY FOR 7 DAYS 30 tablet 2   verapamil  (CALAN -SR) 120 MG CR tablet Take 1 tablet (120 mg total) by mouth daily. 90 tablet 1   No current facility-administered medications for this visit.     Objective:  Psychiatric Specialty Exam: Physical Exam Constitutional:      Appearance: the patient is not toxic-appearing.  Pulmonary:     Effort: Pulmonary effort is normal.  Neurological:     General: No focal deficit present.     Mental Status: the patient is alert and oriented to person, place, and time.   Review of Systems  Respiratory:  Negative for shortness of breath.   Cardiovascular:  Negative for chest pain.  Gastrointestinal:  Negative for abdominal pain, constipation, diarrhea, nausea and vomiting.  Neurological:  Negative for headaches.      There were no vitals taken for this visit.  General Appearance: Fairly Groomed  Eye Contact:  Good  Speech:  Clear and Coherent  Volume:  Normal  Mood:  Euthymic  Affect:  Congruent  Thought Process:  Coherent  Orientation:  Full (Time, Place, and Person)  Thought Content: Logical   Suicidal Thoughts:  No  Homicidal Thoughts:  No  Memory:  Immediate;   Good  Judgement:  fair  Insight:   fair  Psychomotor Activity:  Normal  Concentration:  Concentration: Good  Recall:  Good  Fund of Knowledge: Good  Language: Good  Akathisia:  No  Handed:    AIMS (if indicated): not done  Assets:  Communication Skills Desire for Improvement Financial Resources/Insurance Housing Leisure Time Physical Health  ADL's:  Intact  Cognition: WNL  Sleep:  Fair     Metabolic Disorder Labs: Lab Results  Component Value Date   HGBA1C 5.4 11/26/2021   No results found for: PROLACTIN Lab Results  Component Value Date   CHOL 167 05/24/2023   TRIG 232 (H) 05/24/2023   HDL 48 05/24/2023   CHOLHDL 3.5 05/24/2023   LDLCALC 81 05/24/2023   LDLCALC 175 (H) 02/12/2020   Lab Results  Component Value Date   TSH 3.390 05/24/2023   TSH 6.290 (H) 03/22/2023    Therapeutic Level Labs: No results found for: LITHIUM No results found for: VALPROATE No results found for: CBMZ  Screenings: GAD-7    Flowsheet Row Video Visit from 08/26/2021 in [facility] Video Visit from 06/24/2021 in [facility] Video Visit from 04/22/2021 in [facility] Video Visit from 03/20/2021 in [facility] Video Visit from 02/19/2021 in [facility]  Total GAD-7 Score 9 10 14 13 11    PHQ2-9    Flowsheet Row Office Visit from 05/24/2023 in [facility] Of [place]. [facility] Office Visit from 03/22/2023 in [facility] - [facility] Dept Of [place] [facility]. [facility] Office Visit from 08/10/2022 in [facility] - A Dept Of [place]. [facility] Office Visit from 06/29/2022 in [facility] Dept Of [place] [facility]. [facility] Clinical Support from 03/09/2022 in [facility] - A Dept Of [place] [facility]. [facility]  PHQ-2 Total Score 4 5 2 6 2   PHQ-9 Total Score 12 22 15 19 15     Flowsheet Row UC from 12/11/2022 in [facility]) Video Visit from 08/26/2021 in [facility] Video Visit from 06/24/2021 in [facility]  C-SSRS RISK CATEGORY No Risk Low Risk Low Risk    Collaboration of Care: none  A total of 30 minutes was spent involved in face to face clinical care, chart review, documentation.   Marilou Showman, MD 06/24/2023, 3:30 PM

## 2023-06-28 ENCOUNTER — Encounter: Payer: Self-pay | Admitting: Student

## 2023-06-28 ENCOUNTER — Encounter: Payer: Self-pay | Admitting: Gastroenterology

## 2023-06-28 ENCOUNTER — Ambulatory Visit: Admitting: Gastroenterology

## 2023-06-28 VITALS — BP 114/60 | HR 92 | Temp 98.2°F | Resp 15 | Ht 64.0 in | Wt 201.0 lb

## 2023-06-28 DIAGNOSIS — R1319 Other dysphagia: Secondary | ICD-10-CM

## 2023-06-28 DIAGNOSIS — K2289 Other specified disease of esophagus: Secondary | ICD-10-CM

## 2023-06-28 DIAGNOSIS — Q399 Congenital malformation of esophagus, unspecified: Secondary | ICD-10-CM

## 2023-06-28 DIAGNOSIS — K317 Polyp of stomach and duodenum: Secondary | ICD-10-CM | POA: Diagnosis not present

## 2023-06-28 DIAGNOSIS — K2970 Gastritis, unspecified, without bleeding: Secondary | ICD-10-CM | POA: Diagnosis not present

## 2023-06-28 DIAGNOSIS — K3189 Other diseases of stomach and duodenum: Secondary | ICD-10-CM

## 2023-06-28 DIAGNOSIS — K219 Gastro-esophageal reflux disease without esophagitis: Secondary | ICD-10-CM | POA: Diagnosis not present

## 2023-06-28 DIAGNOSIS — K449 Diaphragmatic hernia without obstruction or gangrene: Secondary | ICD-10-CM | POA: Diagnosis not present

## 2023-06-28 DIAGNOSIS — R1310 Dysphagia, unspecified: Secondary | ICD-10-CM | POA: Diagnosis not present

## 2023-06-28 DIAGNOSIS — K760 Fatty (change of) liver, not elsewhere classified: Secondary | ICD-10-CM

## 2023-06-28 MED ORDER — PANTOPRAZOLE SODIUM 40 MG PO TBEC: 40.0000 mg | DELAYED_RELEASE_TABLET | Freq: Two times a day (BID) | ORAL | 3 refills | Status: DC

## 2023-06-28 MED ORDER — SODIUM CHLORIDE 0.9 % IV SOLN: 500.0000 mL | Freq: Once | INTRAVENOUS | Status: DC

## 2023-06-28 NOTE — Patient Instructions (Addendum)
-   Postdilatation diet. - Change omeprazole  to Protonix  40 mg p.o. BID x 8     weeks, then try QD  - Await pathology results  - Follow an antireflux regimen.  - FU if still with problems.  - The findings and recommendations were discussed     with the patient's family.   YOU HAD AN ENDOSCOPIC PROCEDURE TODAY AT THE [facility]:   Refer to the procedure report that was given to you for any specific questions about what was found during the examination.  If the procedure report does not answer your questions, please call your gastroenterologist to clarify.  If you requested that your care partner not be given the details of your procedure findings, then the procedure report has been included in a sealed envelope for you to review at your convenience later.  YOU SHOULD EXPECT: Some feelings of bloating in the abdomen. Passage of more gas than usual.  Walking can help get rid of the air that was put into your GI tract during the procedure and reduce the bloating. If you had a lower endoscopy (such as a colonoscopy or flexible sigmoidoscopy) you may notice spotting of blood in your stool or on the toilet paper. If you underwent a bowel prep for your procedure, you may not have a normal bowel movement for a few days.  Please Note:  You might notice some irritation and congestion in your nose or some drainage.  This is from the oxygen used during your procedure.  There is no need for concern and it should clear up in a day or so.  SYMPTOMS TO REPORT IMMEDIATELY:   Following upper endoscopy (EGD)  Vomiting of blood or coffee ground material  New chest pain or pain under the shoulder blades  Painful or persistently difficult swallowing  New shortness of breath  Fever of 100F or higher  Black, tarry-looking stools  For urgent or emergent issues, a gastroenterologist can be reached at any hour by calling [phone]. Do not use MyChart messaging for urgent concerns.    DIET:  We  do recommend a small meal at first, but then you may proceed to your regular diet.  Drink plenty of fluids but you should avoid alcoholic beverages for 24 hours.  ACTIVITY:  You should plan to take it easy for the rest of today and you should NOT DRIVE or use heavy machinery until tomorrow (because of the sedation medicines used during the test).    FOLLOW UP: Our staff will call the number listed on your records the next business day following your procedure.  We will call around 7:15- 8:00 am to check on you and address any questions or concerns that you may have regarding the information given to you following your procedure. If we do not reach you, we will leave a message.     If any biopsies were taken you will be contacted by phone or by letter within the next 1-3 weeks.  Please call us  at [phone] if you have not heard about the biopsies in 3 weeks.    SIGNATURES/CONFIDENTIALITY: You and/or your care partner have signed paperwork which will be entered into your electronic medical record.  These signatures attest to the fact that that the information above on your After Visit Summary has been reviewed and is understood.  Full responsibility of the confidentiality of this discharge information lies with you and/or your care-partner.

## 2023-06-28 NOTE — Op Note (Signed)
 [facility] Patient Name: Jodi Gilmore Procedure Date: 06/28/2023 2:29 PM MRN: [id number] Endoscopist: Lajuan Pila , MD, [id number] Age: 68 Referring MD:  Date of Birth: 1955-03-23 Gender: Female Account #: [id number] Procedure:                Upper GI endoscopy Indications:              Dysphagia, GERD Medicines:                Monitored Anesthesia Care Procedure:                Pre-Anesthesia Assessment:                           - Prior to the procedure, a History and Physical                            was performed, and patient medications and                            allergies were reviewed. The patient's tolerance of                            previous anesthesia was also reviewed. The risks                            and benefits of the procedure and the sedation                            options and risks were discussed with the patient.                            All questions were answered, and informed consent                            was obtained. Prior Anticoagulants: The patient has                            taken no anticoagulant or antiplatelet agents. ASA                            Grade Assessment: II - A patient with mild systemic                            disease. After reviewing the risks and benefits,                            the patient was deemed in satisfactory condition to                            undergo the procedure.                           After obtaining informed consent, the endoscope was  passed under direct vision. Throughout the                            procedure, the patient's blood pressure, pulse, and                            oxygen saturations were monitored continuously. The                            Olympus Scope SN [id number] was introduced through the                            mouth, and advanced to the second part of duodenum.                            The upper GI endoscopy was  accomplished without                            difficulty. The patient tolerated the procedure                            well. Scope In: 2:36:35 PM Scope Out: 2:46:13 PM Total Procedure Duration: 0 hours 9 minutes 38 seconds  Findings:                 The distal esophagus was moderately tortuous and                            foreshortened d/t HH. The scope was withdrawn.                            Dilation was performed with a Maloney dilator with                            mild resistance at 52 Fr.                           The Z-line was irregular and was found 33 cm from                            the incisors. Biopsies were taken with a cold                            forceps for histology to r/o Barrett's.                           A 5 cm hiatal hernia was present (extending from 33                            cm up to 35 cm-GE junction up to diaphragmatic                            hiatus)  Localized mild inflammation characterized by                            erythema was found in the gastric antrum. Biopsies                            were taken with a cold forceps for histology.                           A few 2 to 4 mm sessile polyps with no bleeding and                            no stigmata of recent bleeding were found in the                            gastric body. Three polyps were removed with a cold                            snare. Resection and retrieval were complete.                           The examined duodenum was normal. Biopsies for                            histology were taken with a cold forceps for                            evaluation of celiac disease. Complications:            No immediate complications. Estimated Blood Loss:     Estimated blood loss: none. Impression:               - Z-line irregular, 33 cm from the incisors.                            Biopsied.                           - 5 cm hiatal hernia.                            - Gastritis. Biopsied.                           - A few gastric polyps. Resected and retrieved x 3.                           - S/P esophageal dilatation. Recommendation:           - Patient has a contact number available for                            emergencies. The signs and symptoms of potential  delayed complications were discussed with the                            patient. Return to normal activities tomorrow.                            Written discharge instructions were provided to the                            patient.                           - Postdilatation diet.                           - Change omeprazole  to Protonix  40 mg p.o. BID x 8                            weeks, then try QD                           - Await pathology results.                           - Follow an antireflux regimen.                           - FU if still with problems.                           - The findings and recommendations were discussed                            with the patient's family. Lajuan Pila, MD 06/28/2023 2:54:03 PM This report has been signed electronically.

## 2023-06-28 NOTE — Progress Notes (Signed)
 A/o x 3, VSS, gd SR's, pleased with anesthesia, report to RN

## 2023-06-28 NOTE — Progress Notes (Signed)
 Called to room to assist during endoscopic procedure.  Patient ID and intended procedure confirmed with present staff. Received instructions for my participation in the procedure from the performing physician.

## 2023-06-28 NOTE — Progress Notes (Signed)
 Chief Complaint:dysphagia Primary GI Doctor: Dr. Venice Gillis   HPI:  Patient is a  68  year old female patient with past medical history of Graves, Sjogrens, GERD, and Behcetts disease, who presents for main complaint of esophageal dysphagia, elevated liver enzymes, and hepatic steatosis.     Interval History    Patient presents for main complaint of esophageal dysphagia and hepatic steatosis, accompanied by caretaker.     Patient presents for main complaint of esophageal dysphagia that has increased in severity over the last two years. She states it happens with both solids and liquids. It is easier to swallow thickened liquids. She has been choked on certain foods like noodles,  rice or bacon. No weight loss. Reports weight gain. Patient has GERD and currently taking pantoprazole  40 mg po twice daily. Last EGD 20 years ago, unable to recall results. She has been evaluated for choking and gagging at [facility] back in March of 2024.     Patient also has nausea and sometimes vomits about 3 times per week. She uses ondansetron  prn. She uses OTC Ibuprofen 200 mg 5 tablets twice daily and Tylenol  500 mg 6 tablets po daily.    She has irregular bowel movements, where some days she has several bowel movements daily and other times she has no bowel movements. No blood in stool. She uses a OTC colostrum supplement which helps with regulating her bowels, but she states she can't also drink a shake.   Lastly,  patient presents for follow-up on elevated liver enzymes and hepatic steatosis. She reports being told she had hepatic steatosis 6 mths ago. She recalls intermittently being taken off medications in past due to elevated LFT's. No alcohol  in over 20 years. No hx of hepatitis. She did do cocaine in past, stopped 2-3 years ago.   Family history includes; SCC in father, prostate CA, sister with Parkinson's and hemochromatosis, alcohol  abuse, another sister with alcohol  abuse. Multiple family members with  autoimmune disease.        Wt Readings from Last 3 Encounters:  04/05/23 201 lb (91.2 kg)  03/22/23 201 lb 6.4 oz (91.4 kg)  03/11/23 202 lb (91.6 kg)        Past Medical History:  Diagnosis Date   Allergy     Anxiety      situational    Asthma      yeras ago - not current    Behcet's disease (HCC)     Bell's palsy     Cataract      both removed    Depression      situational -    Diplopia     Elevated blood pressure reading      no BP meds currently    Esotropia     Family history of adverse reaction to anesthesia     Fibromyalgia     GERD (gastroesophageal reflux disease)     Graves disease 1991   Heart murmur     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     Hip pain     HLA B27 (HLA B27 positive)     Hyperlipidemia     Hypertension     Knee pain     Memory changes     Morphea scleroderma     Neuromuscular disorder (HCC)      Raynauds    Pneumonia     Psoriatic arthritis (HCC)      seen in  ears only   Raynaud's disease     Sjogren syndrome with dental involvement (HCC)                 Past Surgical History:  Procedure Laterality Date   CYST REMOVAL HAND        left arm posterior   laproscopy       SALPINGECTOMY   1980   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    Procedure: LEFT TOTAL HIP ARTHROPLASTY ANTERIOR APPROACH;  Surgeon: Arnie Lao, MD;  Location: [facility];  Service: Orthopedics;  Laterality: Left;   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 Take with a full glass of water on an empty stomach. 12 tablet 1   amphetamine -dextroamphetamine  (ADDERALL) 10 MG tablet Take 1 tablet (10 mg total) by mouth 2 (two) times daily with a meal. 60 tablet 0   atorvastatin  (LIPITOR) 40 MG tablet TAKE ONE TABLET BY MOUTH ONE TIME DAILY 90 tablet 0    busPIRone  (BUSPAR ) 10 MG tablet Take 1 tablet (10 mg total) by mouth 3 (three) times daily. 90 tablet 2   Calcium  Carb-Cholecalciferol  (CALCIUM  1000 + D) 1000-20 MG-MCG TABS Take 1 tablet by mouth daily. 30 tablet 2   cetirizine  (ZYRTEC  ALLERGY) 10 MG tablet Take 1 tablet (10 mg total) by mouth daily. 90 tablet 1   clotrimazole  (LOTRIMIN ) 1 % cream Apply 1 Application topically 2 (two) times daily. To right underarm 30 g 0   colchicine  0.6 MG tablet Take 1 tablet (0.6 mg total) by mouth daily. 90 tablet 3   doxepin  (SINEQUAN ) 10 MG/ML solution Take 0.3 mLs (3 mg total) by mouth at bedtime. 118 mL 0   fluticasone  (FLONASE ) 50 MCG/ACT nasal spray USE 2 SPRAYS IN EACH NOSTRIL ONCE TIME DAILY 16 mL 6   HYDROcodone -acetaminophen  (NORCO/VICODIN) 5-325 MG tablet Take 1 tablet by mouth every 6 (six) hours as needed for moderate pain (pain score 4-6). Prescribed by pain clinic       levothyroxine  (SYNTHROID ) 137 MCG tablet Take 1 tablet (137 mcg total) by mouth daily before breakfast. 90 tablet 0   lidocaine  (XYLOCAINE ) 2 % solution Swish and spit 15mL no more than every 4 hours for oral ulcer pain. 100 mL 3   methocarbamol  (ROBAXIN ) 500 MG tablet TAKE ONE TABLET BY MOUTH EVERY 6 HOURS AS NEEDED FOR MUSCLE SPASM 40 tablet 1   omeprazole  (PRILOSEC) 40 MG capsule Take 1 capsule (40 mg total) by mouth in the morning and at bedtime. 180 capsule 3   ondansetron  (ZOFRAN ) 4 MG tablet TAKE ONE TABLET BY MOUTH EVERY 8 HOURS AS NEEDED FOR NAUSEA AND/OR VOMITING 20 tablet 0   PARoxetine  (PAXIL ) 40 MG tablet Take 1 tablet (40 mg total) by mouth daily. 90 tablet 1   QUEtiapine  (SEROQUEL ) 400 MG tablet Take 1 tablet (400 mg total) by mouth at bedtime. 90 tablet 1   RESTASIS  0.05 % ophthalmic emulsion INSTILL ONE DROP INTO EACH EYE TWICE DAILY 60 each 3   rizatriptan  (MAXALT ) 10 MG tablet Take 1 tablet (10 mg total) by mouth as needed for migraine. May repeat in 2 hours if needed 30 tablet 2   valACYclovir  (VALTREX ) 500 MG  tablet TAKE ONE TABLET BY MOUTH TWICE A  DAY FOR 7 DAYS 30 tablet 2   verapamil  (CALAN -SR) 120 MG CR tablet TAKE ONE TABLET BY MOUTH ONE TIME DAILY AT BEDTIME 90 tablet 1      No current facility-administered medications for this visit.             Allergies as of 04/05/2023 - Review Complete 04/05/2023  Allergen Reaction Noted   Iodinated contrast media Anaphylaxis 05/11/2017   Prohance [gadoteridol] Anaphylaxis 01/04/2020   Ioversol   12/11/2022   Ptu [propylthiouracil]   01/02/2020   Sulfa antibiotics Hives and Other (See Comments) 05/11/2017   Tapazole [methimazole]   01/02/2020   Latex Hives and Rash 01/02/2020           Family History  Problem Relation Age of Onset   Heart disease Mother     Congestive Heart Failure Mother     Dementia Mother     Heart disease Father     Heart attack Father     Dementia Sister     Hemachromatosis Sister     Parkinson's disease Sister     Allergies Son     Healthy Son     Colon cancer Neg Hx     Colon polyps Neg Hx     Esophageal cancer Neg Hx     Stomach cancer Neg Hx     Rectal cancer Neg Hx     BRCA 1/2 Neg Hx     Breast cancer Neg Hx            Review of Systems:    Constitutional: No weight loss, fever, chills, weakness or fatigue HEENT: Eyes: No change in vision               Ears, Nose, Throat:  No change in hearing or congestion Skin: No rash or itching Cardiovascular: No chest pain, chest pressure or palpitations   Respiratory: No SOB or cough Gastrointestinal: See HPI and otherwise negative Genitourinary: No dysuria or change in urinary frequency Neurological: No headache, dizziness or syncope Musculoskeletal: No new muscle or joint pain Hematologic: No bleeding or bruising Psychiatric: No history of depression or anxiety      Physical Exam:  Vital signs: BP 130/88   Pulse (!) 107   Ht 5' 4 (1.626 m)   Wt 201 lb (91.2 kg)   BMI 34.50 kg/m    Constitutional:   Pleasant  female appears to be in NAD,  Well developed, Well nourished, alert and cooperative Throat: Oral cavity and pharynx without inflammation, notes lesions in mouth Respiratory: Respirations even and unlabored. Lungs clear to auscultation bilaterally.   No wheezes, crackles, or rhonchi.  Cardiovascular: Normal S1, S2. Regular rate and rhythm. No peripheral edema, cyanosis or pallor.  Gastrointestinal:  Soft, nondistended, nontender. Obese. No rebound or guarding. Hypoactive bowel sounds. No appreciable masses or hepatomegaly. Rectal:  Not performed.  Msk:  patient constantly moving hands and/or legs throughout whole appt Neurologic:  Alert and  oriented x4;  grossly normal neurologically.  Skin:   Dry and intact without significant lesions or rashes. Psychiatric: Oriented to person, place and time. Demonstrates good judgement and reason without abnormal affect or behaviors.   RELEVANT LABS AND IMAGING: CBC     Latest Ref Rng & Units 03/22/2023   11:59 AM 11/09/2022    1:59 PM 08/10/2022    4:37 PM  CBC  WBC 3.4 - 10.8 x10E3/uL 5.6  10.2  8.5   Hemoglobin 11.1 - 15.9 g/dL 57.8  46.9  12.7   Hematocrit 34.0 - 46.6 % 41.7  38.7  39.2   Platelets 150 - 450 x10E3/uL 297  291  317       CMP         Latest Ref Rng & Units 03/22/2023   11:59 AM 02/15/2023    2:08 PM 11/09/2022    1:59 PM  CMP  Glucose 70 - 99 mg/dL 409  85  811   BUN 8 - 27 mg/dL 18  16  22    Creatinine 0.57 - 1.00 mg/dL 9.14  7.82  9.56   Sodium 134 - 144 mmol/L 143  140  139   Potassium 3.5 - 5.2 mmol/L 4.6  4.4  3.8   Chloride 96 - 106 mmol/L 101  103  103   CO2 20 - 29 mmol/L 27  23  19    Calcium  8.7 - 10.3 mg/dL 9.8  9.3  9.6   Total Protein 6.0 - 8.5 g/dL 6.9  6.7  6.9   Total Bilirubin 0.0 - 1.2 mg/dL 0.3  0.2  0.4   Alkaline Phos 44 - 121 IU/L 137  161  157   AST 0 - 40 IU/L 35  28  28   ALT 0 - 32 IU/L 44  32  35       Recent Labs       Lab Results  Component Value Date    TSH 6.290 (H) 03/22/2023    03/22/23 labs show: Alk phosp  150>157>161>137, ALT 24>35>32>44, Normal Total Bili and AST, Gamma GT 176, PLT 297   09/01/2022 CT Abdomen Pelvis WO IV Contrast IMPRESSION:  1. Moderate fecal retention.  2.  Hepatic steatosis.  3.  Small to moderate-sized hiatal hernia.     08/18/22 echo- Left ventricular ejection fraction, by estimation, is 60 to 65%.  04/04/20 colonoscopy with Dr. Sandrea Cruel The entire examined colon is normal on direct and retroflexion views. No specimens collected. Repeat in 10 years. 3/6/ 2024 FL ESOPHAGUS BARIUM SWALLOW WITH VIDEO AND SPEECH   Assessment:     Encounter Diagnoses  Name Primary?   Hepatic steatosis Yes   Gastroesophageal reflux disease, unspecified whether esophagitis present     Esophageal dysphagia     Elevated serum GGT level         68 year old female patient that presents for evaluation of hepatic steatosis and elevated liver enzymes. 03/22/23 labs show: Alk phosp 150>157>161>137, ALT 24>35>32>44, Normal Total Bili and AST, Gamma GT 176, PLT 297. History of autoimmune disease, will check lab work to rule out autoimmune hepatitis. Also r/o viral- Hep B and C negative. Needs hepatitis B series. Will check for hepatitis A. Also check for genetic disorders. 08/2022 CT scan ABD/pelvis shows hepatic steatosis, no splenomegaly. No ascites. We discussed dietary changes, physical exercise, and weight loss.  For the dysphagia will proceed with upper GI endoscopy with possible dilatation to rule out stricture, web, or esophagitis. Swallow study with no additional penetration and no aspiration. Per report given that swallow function is grossly normal, there is concern for possible esophageal component contributing to symptoms. If negative exam make consider esophageal manometry to rule out motility disorder.     Plan: -Discontinue Tylenol  use -Recommend pt receive hepatitis B series with PCP as scheduled -Order Abd US  RUQ liver (HCC screening) -check ANA, AMA, Anti-smooth muscle antibody,  Hepatitis A IgG, ferritin, TIBC,  Alpha 1 antitrypsin, ceruloplasmin, tTG, total IgA, IgG - Schedule EGD in [facility] with Dr. Venice Gillis  with possible dilatation. The risks and benefits of EGD with possible biopsies and esophageal dilation were discussed with the patient who agrees to proceed.   Thank you for the courtesy of this consult. Please call me with any questions or concerns.    Deanna May, FNP-C [facility]    Attending physician's note   I have taken history, reviewed the chart and examined the patient. I performed a substantive portion of this encounter, including complete performance of at least one of the key components, in conjunction with the APP. I agree with the Advanced Practitioner's note, impression and recommendations.   GERD with eso dysphagia on omeprazole  40BID, neg MBS For EGD today   Magnus Schuller, MD [facility] [phone]

## 2023-06-29 ENCOUNTER — Telehealth: Payer: Self-pay

## 2023-06-29 ENCOUNTER — Encounter: Payer: Self-pay | Admitting: Student

## 2023-06-29 NOTE — Telephone Encounter (Signed)
  Follow up Call-     06/28/2023    1:37 PM  Call back number  Post procedure Call Back phone  # [phone]     Patient questions:  Do you have a fever, pain , or abdominal swelling? No. Pain Score  0 *  Have you tolerated food without any problems? Yes.    Have you been able to return to your normal activities? Yes.    Do you have any questions about your discharge instructions: Diet   No. Medications  No. Follow up visit  No.  Do you have questions or concerns about your Care? No.  Actions: * If pain score is 4 or above: No action needed, pain <4.

## 2023-06-30 ENCOUNTER — Encounter: Payer: Self-pay | Admitting: Student

## 2023-07-01 ENCOUNTER — Encounter: Payer: Self-pay | Admitting: Student

## 2023-07-01 ENCOUNTER — Other Ambulatory Visit: Payer: Self-pay | Admitting: *Deleted

## 2023-07-01 DIAGNOSIS — R110 Nausea: Secondary | ICD-10-CM

## 2023-07-01 LAB — SURGICAL PATHOLOGY

## 2023-07-01 MED ORDER — ONDANSETRON HCL 4 MG PO TABS: 4.0000 mg | ORAL_TABLET | Freq: Three times a day (TID) | ORAL | 0 refills | Status: DC | PRN

## 2023-07-02 NOTE — Progress Notes (Deleted)
 Cardiology Office Note:    Date:  07/02/2023   ID:  Jodi Gilmore, DOB [birth year], MRN [id number]  PCP:  Jodi Lynwood NOVAK, MD   [facility] Providers Cardiologist:  None { Click to update primary MD,subspecialty MD or APP then REFRESH:1}    Referring MD: Jodi Lynwood NOVAK, MD   No chief complaint on file. ***  History of Present Illness:    Jodi Gilmore is a 69 y.o. female seen at the request of Dr Jodi for evaluation of chest pain. She has a history of HTN and HLD. 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    Hip pain    HLA B27 (HLA B27 positive)    Hyperlipidemia    Hypertension    Knee pain    Memory changes    Morphea scleroderma    Neuromuscular disorder (HCC)    Raynauds    Osteoporosis    Pneumonia    Psoriatic arthritis (HCC)    seen in ears only   Raynaud's disease    Sjogren syndrome with dental involvement (HCC)     Past Surgical History:  Procedure Laterality Date   CYST REMOVAL HAND     left arm posterior   laproscopy     SALPINGECTOMY  1980   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   Procedure: LEFT TOTAL HIP ARTHROPLASTY ANTERIOR APPROACH;  Surgeon: Vernetta Lonni GRADE, MD;  Location: [facility];  Service: Orthopedics;  Laterality: Left;   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      Current Medications: No outpatient medications have been marked as taking for the 07/05/23 encounter  (Appointment) with [place], Ellissa Ayo M, MD.     Allergies:   Iodinated contrast media, Prohance [gadoteridol], Ioversol, Latex, Ptu [propylthiouracil], Sulfa antibiotics, and Tapazole [methimazole]   Social History   Socioeconomic History   Marital status: Single    Spouse name: Not on file   Number of children: Not on file   Years of education: Not on file   Highest education level: Some college, no degree  Occupational History   Occupation: Unemployed  Tobacco Use   Smoking status: Former    Passive exposure: Past   Smokeless tobacco: Never  Vaping Use   Vaping status: Never Used  Substance and Sexual Activity   Alcohol  use: Not Currently   Drug use: Never   Sexual activity: Not Currently  Other Topics Concern   Not on file  Social History Narrative   Not on file   Social Drivers of Health   Financial Resource Strain: High Risk (04/08/2023)   Overall Financial Resource Strain (CARDIA)    Difficulty of Paying Living Expenses: Very hard  Food Insecurity: No Food Insecurity (04/08/2023)   Hunger  Vital Sign    Worried About Programme researcher, broadcasting/film/video in the Last Year: Never true    Ran Out of Food in the Last Year: Never true  Transportation Needs: Unmet Transportation Needs (04/08/2023)   PRAPARE - Transportation    Lack of Transportation (Medical): Yes    Lack of Transportation (Non-Medical): Yes  Physical Activity: Unknown (12/23/2022)   Exercise Vital Sign    Days of Exercise per Week: 0 days    Minutes of Exercise per Session: Not on file  Recent Concern: Physical Activity - Inactive (12/23/2022)   Exercise Vital Sign    Days of Exercise per Week: 0 days    Minutes of Exercise per Session: 0 min  Stress: Stress Concern Present (12/23/2022)   [employer] of Occupational Health - Occupational Stress Questionnaire    Feeling of Stress : To some extent  Social Connections: Unknown (12/23/2022)   Social Connection and Isolation Panel    Frequency of Communication with  Friends and Family: Twice a week    Frequency of Social Gatherings with Friends and Family: Twice a week    Attends Religious Services: Never    Database administrator or Organizations: No    Attends Engineer, structural: Not on file    Marital Status: Patient declined     Family History: The patient's ***family history includes Allergies in her son; Congestive Heart Failure in her mother; Dementia in her mother and sister; Healthy in her son; Heart attack in her father; Heart disease in her father and mother; Hemachromatosis in her sister; Parkinson's disease in her sister. There is no history of Colon cancer, Colon polyps, Esophageal cancer, Stomach cancer, Rectal cancer, BRCA 1/2, or Breast cancer.  ROS:   Please see the history of present illness.    *** All other systems reviewed and are negative.  EKGs/Labs/Other Studies Reviewed:    The following studies were reviewed today: Echo 08/18/22: IMPRESSIONS     1. Left ventricular ejection fraction, by estimation, is 60 to 65%. The  left ventricle has normal function. The left ventricle has no regional  wall motion abnormalities. Left ventricular diastolic parameters are  consistent with Grade I diastolic  dysfunction (impaired relaxation).   2. Right ventricular systolic function is normal. The right ventricular  size is normal.   3. The mitral valve is normal in structure. No evidence of mitral valve  regurgitation. No evidence of mitral stenosis.   4. The aortic valve is normal in structure. Aortic valve regurgitation is  not visualized. Aortic valve sclerosis/calcification is present, without  any evidence of aortic stenosis.   5. The inferior vena cava is normal in size with greater than 50%  respiratory variability, suggesting right atrial pressure of 3 mmHg.        Recent Labs: 02/15/2023: Magnesium 2.4 03/22/2023: Hemoglobin 14.1; Platelets 297 05/24/2023: ALT 38; BUN 21; Creatinine, Ser 0.94; Potassium 4.1;  Sodium 140; TSH 3.390  Recent Lipid Panel    Component Value Date/Time   CHOL 167 05/24/2023 1410   TRIG 232 (H) 05/24/2023 1410   HDL 48 05/24/2023 1410   CHOLHDL 3.5 05/24/2023 1410   LDLCALC 81 05/24/2023 1410   LDLDIRECT 82 05/15/2020 1434     Risk Assessment/Calculations:   {Does this patient have ATRIAL FIBRILLATION?[phone]}  No BP recorded.  {Refresh Note OR Click here to enter BP  :1}***         Physical Exam:    VS:  There were no vitals  taken for this visit.    Wt Readings from Last 3 Encounters:  06/28/23 201 lb (91.2 kg)  05/24/23 203 lb 6.4 oz (92.3 kg)  04/05/23 201 lb (91.2 kg)     GEN: *** Well nourished, well developed in no acute distress HEENT: Normal NECK: No JVD; No carotid bruits LYMPHATICS: No lymphadenopathy CARDIAC: ***RRR, no murmurs, rubs, gallops RESPIRATORY:  Clear to auscultation without rales, wheezing or rhonchi  ABDOMEN: Soft, non-tender, non-distended MUSCULOSKELETAL:  No edema; No deformity  SKIN: Warm and dry NEUROLOGIC:  Alert and oriented x 3 PSYCHIATRIC:  Normal affect   ASSESSMENT:    No diagnosis found. PLAN:    In order of problems listed above:  ***      {Are you ordering a CV Procedure (e.g. stress test, cath, DCCV, TEE, etc)?   Press F2        :[id number]}    Medication Adjustments/Labs and Tests Ordered: Current medicines are reviewed at length with the patient today.  Concerns regarding medicines are outlined above.  No orders of the defined types were placed in this encounter.  No orders of the defined types were placed in this encounter.   There are no Patient Instructions on file for this visit.   Signed, Analeese Andreatta [place], MD  07/02/2023 7:49 AM    [facility]

## 2023-07-03 ENCOUNTER — Ambulatory Visit: Payer: Self-pay | Admitting: Gastroenterology

## 2023-07-04 ENCOUNTER — Encounter: Payer: Self-pay | Admitting: Student

## 2023-07-05 ENCOUNTER — Encounter: Payer: Self-pay | Admitting: Gastroenterology

## 2023-07-05 ENCOUNTER — Ambulatory Visit: Admitting: Cardiology

## 2023-07-05 ENCOUNTER — Telehealth: Payer: Self-pay

## 2023-07-05 ENCOUNTER — Ambulatory Visit: Attending: Cardiology | Admitting: Cardiology

## 2023-07-05 ENCOUNTER — Encounter: Payer: Self-pay | Admitting: Cardiology

## 2023-07-05 VITALS — BP 138/90 | HR 85 | Ht 64.0 in | Wt 206.0 lb

## 2023-07-05 DIAGNOSIS — R5383 Other fatigue: Secondary | ICD-10-CM | POA: Insufficient documentation

## 2023-07-05 DIAGNOSIS — R0609 Other forms of dyspnea: Secondary | ICD-10-CM | POA: Insufficient documentation

## 2023-07-05 DIAGNOSIS — I10 Essential (primary) hypertension: Secondary | ICD-10-CM | POA: Insufficient documentation

## 2023-07-05 DIAGNOSIS — R079 Chest pain, unspecified: Secondary | ICD-10-CM | POA: Diagnosis not present

## 2023-07-05 NOTE — Patient Instructions (Addendum)
 Medication Instructions:  Continue same medications  Lab Work: None ordered  Testing/Procedures: Cardiac Pet CT  Will be scheduled at [facility] [place] after approved by insurance   Follow instructions below  Follow-Up: At [facility], you and your health needs are our priority.  As part of our continuing mission to provide you with exceptional heart care, our providers are all part of one team.  This team includes your primary Cardiologist (physician) and Advanced Practice Providers or APPs (Physician Assistants and Nurse Practitioners) who all work together to provide you with the care you need, when you need it.  Your next appointment:  To Be Determined after test    Provider:  Dr.Jordan   We recommend signing up for the patient portal called MyChart.  Sign up information is provided on this After Visit Summary.  MyChart is used to connect with patients for Virtual Visits (Telemedicine).  Patients are able to view lab/test results, encounter notes, upcoming appointments, etc.  Non-urgent messages can be sent to your provider as well.   To learn more about what you can do with MyChart, go to [email or web address].         Please report to Radiology at the [facility] Main Entrance 30 minutes early for your test.  [address] [place], [place] [address]                         OR   Please report to Radiology at [facility] Main Entrance, medical mall, 30 mins prior to your test.  [address]  [place], [place]  How to Prepare for Your Cardiac PET/CT Stress Test:  Nothing to eat or drink, except water, 3 hours prior to arrival time.  NO caffeine /decaffeinated products, or chocolate 12 hours prior to arrival. (Please note decaffeinated beverages (teas/coffees) still contain caffeine ).  If you have caffeine  within 12 hours prior, the test will need to be rescheduled.  Medication instructions: Do not take nitrates  (isosorbide mononitrate, Ranexa) the day before or day of test Do not take tamsulosin the day before or morning of test Hold theophylline containing medications for 12 hours. Hold Dipyridamole 48 hours prior to the test.  Diabetic Preparation: If able to eat breakfast prior to 3 hour fasting, you may take all medications, including your insulin. Do not worry if you miss your breakfast dose of insulin - start at your next meal. If you do not eat prior to 3 hour fast-Hold all diabetes (oral and insulin) medications. Patients who wear a continuous glucose monitor MUST remove the device prior to scanning.  You may take your remaining medications with water.  NO perfume, cologne or lotion on chest or abdomen area. FEMALES - Please avoid wearing dresses to this appointment.  Total time is 1 to 2 hours; you may want to bring reading material for the waiting time.   In preparation for your appointment, medication and supplies will be purchased.  Appointment availability is limited, so if you need to cancel or reschedule, please call the Radiology Department Scheduler at [phone] 24 hours in advance to avoid a cancellation fee of $100.00  What to Expect When you Arrive:  Once you arrive and check in for your appointment, you will be taken to a preparation room within the Radiology Department.  A technologist or Nurse will obtain your medical history, verify that you are correctly prepped for the exam, and explain the procedure.  Afterwards,  an IV will be started in your arm and electrodes will be placed on your skin for EKG monitoring during the stress portion of the exam. Then you will be escorted to the PET/CT scanner.  There, staff will get you positioned on the scanner and obtain a blood pressure and EKG.  During the exam, you will continue to be connected to the EKG and blood pressure machines.  A small, safe amount of a radioactive tracer will be injected in your IV to obtain a series of  pictures of your heart along with an injection of a stress agent.    After your Exam:  It is recommended that you eat a meal and drink a caffeinated beverage to counter act any effects of the stress agent.  Drink plenty of fluids for the remainder of the day and urinate frequently for the first couple of hours after the exam.  Your doctor will inform you of your test results within 7-10 business days.  For more information and frequently asked questions, please visit our website: [email or web address]  For questions about your test or how to prepare for your test, please call: Cardiac Imaging Nurse Navigators Office: [phone]

## 2023-07-05 NOTE — Telephone Encounter (Signed)
 Would you like for me to contact this patient or wait for her to call back?  Should I let Tonya know it's ok to cancel the order?

## 2023-07-05 NOTE — Progress Notes (Signed)
 Cardiology Office Note:    Date:  07/05/2023   ID:  Tondalaya Perren, DOB Dec 01, 1955, MRN [id number]  PCP:  Marlee Lynwood NOVAK, MD   [facility] Providers Cardiologist:  None     Referring MD: Marlee Lynwood NOVAK, MD   Chief Complaint  Patient presents with   Shortness of Breath   Chest Pain    History of Present Illness:    Monet North is a 68 y.o. female seen at the request of Dr Marlee for evaluation of chest pain/SOB and fatigue. She has a history of HTN and HLD. 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   She reports significant symptoms of dyspnea and fatigue with any activity. Also has intermittent chest pain. Notes oxygen saturation drops with activity into the upper 80s. Former smoker 1/2 ppd but quit in 1988. Very sedentary due to imbalance. Has chronic back problems and poor vision. States she feels tired all the time.   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    Hip pain    HLA B27 (HLA B27 positive)    Hyperlipidemia    Hypertension    Knee pain    Memory changes    Morphea scleroderma    Neuromuscular disorder (HCC)    Raynauds    Osteoporosis    Pneumonia    Psoriatic arthritis (HCC)    seen in ears only   Raynaud's disease    Sjogren syndrome with dental involvement (HCC)     Past Surgical History:  Procedure Laterality Date   CYST REMOVAL HAND     left arm posterior   laproscopy     SALPINGECTOMY  1980   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 Take with a full glass of water on an empty stomach.   amphetamine -dextroamphetamine  (ADDERALL) 10 MG tablet Take 1 tablet (10 mg total) by mouth 2 (two) times daily with a meal.   [START ON 07/29/2023] amphetamine -dextroamphetamine  (ADDERALL) 10 MG tablet Take 1 tablet (10 mg total) by mouth 2 (two) times daily with a meal.   [START ON 08/28/2023] amphetamine -dextroamphetamine  (ADDERALL) 10 MG tablet Take 1 tablet (10 mg total) by mouth 2 (two) times daily with a meal.   atorvastatin  (LIPITOR) 40 MG tablet TAKE ONE TABLET BY MOUTH ONE TIME DAILY   busPIRone  (BUSPAR ) 10 MG tablet Take 1 tablet (10 mg total) by mouth 3 (three) times daily.   Calcium   Carb-Cholecalciferol  (CALCIUM  1000 + D) 1000-20 MG-MCG TABS Take 1 tablet by mouth daily.   cetirizine  (ZYRTEC  ALLERGY) 10 MG tablet Take 1 tablet (10 mg total) by mouth daily.   clotrimazole  (LOTRIMIN ) 1 % cream Apply 1 Application topically 2 (two) times daily. To right underarm   colchicine  0.6 MG tablet Take 1 tablet (0.6 mg total) by mouth daily.   fluticasone  (FLONASE ) 50 MCG/ACT nasal spray USE 2 SPRAYS IN EACH NOSTRIL ONCE TIME DAILY   levothyroxine  (SYNTHROID ) 137 MCG tablet TAKE ONE TABLET BY MOUTH ONE TIME DAILY BEFORE BREAKFAST   lidocaine  (XYLOCAINE ) 2 % solution Swish and spit 15mL no more than every 4 hours for oral ulcer pain.   methocarbamol  (ROBAXIN ) 500 MG tablet TAKE ONE TABLET BY MOUTH EVERY 6 HOURS AS NEEDED FOR MUSCLE SPASM   omeprazole  (PRILOSEC) 40 MG capsule Take 1 capsule (40 mg total) by mouth in the morning and at bedtime.   ondansetron  (ZOFRAN ) 4 MG tablet Take 1  tablet (4 mg total) by mouth every 8 (eight) hours as needed for nausea or vomiting.   pantoprazole  (PROTONIX ) 40 MG tablet Take 1 tablet (40 mg total) by mouth 2 (two) times daily. Take 2 times a day for 8 weeks then reduce to once a day   PARoxetine  (PAXIL ) 40 MG tablet Take 1 tablet (40 mg total) by mouth daily.   QUEtiapine  (SEROQUEL ) 400 MG tablet Take 1 tablet (400 mg total) by mouth at bedtime.   RESTASIS  0.05 % ophthalmic emulsion INSTILL ONE DROP INTO EACH EYE TWICE DAILY   rizatriptan  (MAXALT ) 10 MG tablet Take 1 tablet (10 mg total) by mouth as needed for migraine. May repeat in 2 hours if needed. Do not take more than THREE doses in one day.   valACYclovir  (VALTREX ) 500 MG tablet TAKE ONE TABLET BY MOUTH TWICE A DAY FOR 7 DAYS   verapamil  (CALAN -SR) 120 MG CR tablet Take 1 tablet (120 mg total) by mouth daily.     Allergies:   Iodinated contrast media, Prohance [gadoteridol], Ioversol, Latex, Ptu [propylthiouracil], Sulfa antibiotics, and Tapazole [methimazole]   Social History   Socioeconomic History   Marital status: Single    Spouse name: Not on file   Number of children: 1   Years of education: Not on file   Highest education level: Some college, no degree  Occupational History   Occupation: Unemployed  Tobacco Use   Smoking status: Former    Passive exposure: Past   Smokeless tobacco: Never  Vaping Use   Vaping status: Never Used  Substance and Sexual Activity   Alcohol  use: Not Currently   Drug use: Never   Sexual activity: Not Currently  Other Topics Concern   Not on file  Social History Narrative   Not on file   Social Drivers of Health   Financial Resource Strain: High Risk (04/08/2023)   Overall Financial Resource Strain (CARDIA)    Difficulty of Paying Living Expenses: Very hard  Food Insecurity: No Food Insecurity (04/08/2023)   Hunger Vital Sign    Worried About Running Out of Food in the Last Year: Never true    Ran Out of Food in the Last Year:  Never true  Transportation Needs: Unmet Transportation Needs (04/08/2023)   PRAPARE - Transportation    Lack of Transportation (Medical): Yes    Lack of Transportation (Non-Medical): Yes  Physical Activity: Unknown (12/23/2022)   Exercise Vital Sign    Days of Exercise per Week: 0 days  Minutes of Exercise per Session: Not on file  Recent Concern: Physical Activity - Inactive (12/23/2022)   Exercise Vital Sign    Days of Exercise per Week: 0 days    Minutes of Exercise per Session: 0 min  Stress: Stress Concern Present (12/23/2022)   [employer] of Occupational Health - Occupational Stress Questionnaire    Feeling of Stress : To some extent  Social Connections: Unknown (12/23/2022)   Social Connection and Isolation Panel    Frequency of Communication with Friends and Family: Twice a week    Frequency of Social Gatherings with Friends and Family: Twice a week    Attends Religious Services: Never    Database administrator or Organizations: No    Attends Engineer, structural: Not on file    Marital Status: Patient declined     Family History: The patient's family history includes Allergies in her son; Congestive Heart Failure in her mother; Dementia in her mother and sister; Heart attack in her father and mother; Heart disease in her mother; Heart disease (age of onset: 22) in her father; Hemachromatosis in her sister; Parkinson's disease in her sister. There is no history of Colon cancer, Colon polyps, Esophageal cancer, Stomach cancer, Rectal cancer, BRCA 1/2, or Breast cancer.  ROS:   Please see the history of present illness.     All other systems reviewed and are negative.  EKGs/Labs/Other Studies Reviewed:    The following studies were reviewed today: Echo 08/18/22: IMPRESSIONS     1. Left ventricular ejection fraction, by estimation, is 60 to 65%. The  left ventricle has normal function. The left ventricle has no regional  wall motion abnormalities. Left  ventricular diastolic parameters are  consistent with Grade I diastolic  dysfunction (impaired relaxation).   2. Right ventricular systolic function is normal. The right ventricular  size is normal.   3. The mitral valve is normal in structure. No evidence of mitral valve  regurgitation. No evidence of mitral stenosis.   4. The aortic valve is normal in structure. Aortic valve regurgitation is  not visualized. Aortic valve sclerosis/calcification is present, without  any evidence of aortic stenosis.   5. The inferior vena cava is normal in size with greater than 50%  respiratory variability, suggesting right atrial pressure of 3 mmHg.   EKG Interpretation Date/Time:  Tuesday July 05 2023 14:58:02 EDT Ventricular Rate:  85 PR Interval:  180 QRS Duration:  66 QT Interval:  386 QTC Calculation: 459 R Axis:   47  Text Interpretation: Normal sinus rhythm Possible Left atrial enlargement Septal infarct , age undetermined When compared with ECG of 29-Jun-2022 15:11, No significant change was found Confirmed by [place], Skippy Marhefka [phone]) on 07/05/2023 3:04:58 PM   EKG Interpretation Date/Time:  Tuesday July 05 2023 14:58:02 EDT Ventricular Rate:  85 PR Interval:  180 QRS Duration:  66 QT Interval:  386 QTC Calculation: 459 R Axis:   47  Text Interpretation: Normal sinus rhythm Possible Left atrial enlargement Septal infarct , age undetermined When compared with ECG of 29-Jun-2022 15:11, No significant change was found Confirmed by [place], Lamisha Roussell [id number]) on 07/05/2023 3:04:58 PM   Recent Labs: 02/15/2023: Magnesium 2.4 03/22/2023: Hemoglobin 14.1; Platelets 297 05/24/2023: ALT 38; BUN 21; Creatinine, Ser 0.94; Potassium 4.1; Sodium 140; TSH 3.390  Recent Lipid Panel    Component Value Date/Time   CHOL 167 05/24/2023 1410   TRIG 232 (H) 05/24/2023 1410   HDL 48 05/24/2023 1410   CHOLHDL 3.5  05/24/2023 1410   LDLCALC 81 05/24/2023 1410   LDLDIRECT 82 05/15/2020 1434     Risk  Assessment/Calculations:       Physical Exam:    VS:  BP (!) 138/90   Pulse 85   Ht 5' 4 (1.626 m)   Wt 206 lb (93.4 kg)   SpO2 95%   BMI 35.36 kg/m     Wt Readings from Last 3 Encounters:  07/05/23 206 lb (93.4 kg)  06/28/23 201 lb (91.2 kg)  05/24/23 203 lb 6.4 oz (92.3 kg)     GEN:  Well nourished, overweight, in no acute distress HEENT: Normal NECK: No JVD; No carotid bruits LYMPHATICS: No lymphadenopathy CARDIAC: RRR, soft systolic murmur RUSB. No rubs, gallops RESPIRATORY:  Clear to auscultation without rales, wheezing or rhonchi  ABDOMEN: Soft, non-tender, non-distended MUSCULOSKELETAL:  No edema; No deformity  SKIN: Warm and dry NEUROLOGIC:  Alert and oriented x 3 PSYCHIATRIC:  Normal affect   ASSESSMENT:    1. Chest pain, unspecified type   2. Essential (primary) hypertension    PLAN:    In order of problems listed above:  Symptoms of DOE, chest pain and marked fatigue. Prior Echo was unremarkable. Recommend ischemic evaluation. Given iodine  allergy with anaphylaxis would avoid coronary CTA. Will arrange for coronary PET CT. If normal would consider sleep study and pulmonary evaluation.  AV sclerosis. No stenosis HTN. HLD on statin. LDL 175>>81      Informed Consent   Shared Decision Making/Informed Consent The risks [chest pain, shortness of breath, cardiac arrhythmias, dizziness, blood pressure fluctuations, myocardial infarction, stroke/transient ischemic attack, nausea, vomiting, allergic reaction, radiation exposure, metallic taste sensation and life-threatening complications (estimated to be 1 in 10,000)], benefits (risk stratification, diagnosing coronary artery disease, treatment guidance) and alternatives of a cardiac PET stress test were discussed in detail with Ms. Stetzel and she agrees to proceed.       Medication Adjustments/Labs and Tests Ordered: Current medicines are reviewed at length with the patient today.  Concerns regarding medicines  are outlined above.  Orders Placed This Encounter  Procedures   NM PET CT CARDIAC PERFUSION MULTI W/ABSOLUTE BLOODFLOW   EKG 12-Lead   No orders of the defined types were placed in this encounter.   There are no Patient Instructions on file for this visit.   Signed, Raevin Wierenga [place], MD  07/05/2023 3:21 PM    [facility]

## 2023-07-05 NOTE — Telephone Encounter (Signed)
 I have corrected the last note. Apologize for mistake Pl let pt know RG

## 2023-07-06 ENCOUNTER — Encounter: Payer: Self-pay | Admitting: Student

## 2023-07-06 MED ORDER — PREDNISONE 20 MG PO TABS: 40.0000 mg | ORAL_TABLET | Freq: Every day | ORAL | 0 refills | Status: AC

## 2023-07-06 NOTE — Telephone Encounter (Signed)
 Ok.  I notified Bascom this morning.

## 2023-07-06 NOTE — Telephone Encounter (Signed)
 Called patient to discuss. 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 These have responded well to steroid bursts in the past. Will therefore send five day prednisone  burst. Would advise AGAINST NSAIDs given EGD findings c/w gastropathy and irregular Z line.   JINNY Con Gasman, MD

## 2023-07-18 ENCOUNTER — Encounter: Payer: Self-pay | Admitting: Family Medicine

## 2023-07-19 ENCOUNTER — Ambulatory Visit: Payer: Self-pay | Admitting: Family Medicine

## 2023-07-21 ENCOUNTER — Encounter: Payer: Self-pay | Admitting: Gastroenterology

## 2023-07-21 NOTE — Telephone Encounter (Signed)
 It should be fine. RG

## 2023-07-22 ENCOUNTER — Other Ambulatory Visit: Payer: Self-pay | Admitting: Orthopaedic Surgery

## 2023-07-26 ENCOUNTER — Other Ambulatory Visit: Payer: Self-pay

## 2023-07-26 MED ORDER — RIZATRIPTAN BENZOATE 10 MG PO TABS: 10.0000 mg | ORAL_TABLET | ORAL | 2 refills | Status: DC | PRN

## 2023-07-29 ENCOUNTER — Encounter: Payer: Self-pay | Admitting: Family Medicine

## 2023-08-10 ENCOUNTER — Encounter: Payer: Self-pay | Admitting: Family Medicine

## 2023-08-10 ENCOUNTER — Ambulatory Visit (INDEPENDENT_AMBULATORY_CARE_PROVIDER_SITE_OTHER): Admitting: Family Medicine

## 2023-08-10 VITALS — BP 140/86 | HR 91 | Ht 64.0 in | Wt 205.8 lb

## 2023-08-10 DIAGNOSIS — R7309 Other abnormal glucose: Secondary | ICD-10-CM | POA: Diagnosis not present

## 2023-08-10 DIAGNOSIS — R5383 Other fatigue: Secondary | ICD-10-CM

## 2023-08-10 DIAGNOSIS — I10 Essential (primary) hypertension: Secondary | ICD-10-CM | POA: Diagnosis not present

## 2023-08-10 DIAGNOSIS — M810 Age-related osteoporosis without current pathological fracture: Secondary | ICD-10-CM | POA: Diagnosis not present

## 2023-08-10 DIAGNOSIS — R269 Unspecified abnormalities of gait and mobility: Secondary | ICD-10-CM | POA: Diagnosis not present

## 2023-08-10 DIAGNOSIS — E039 Hypothyroidism, unspecified: Secondary | ICD-10-CM

## 2023-08-10 DIAGNOSIS — E559 Vitamin D deficiency, unspecified: Secondary | ICD-10-CM | POA: Diagnosis not present

## 2023-08-10 LAB — POCT GLYCOSYLATED HEMOGLOBIN (HGB A1C): Hemoglobin A1C: 5.3 % (ref 4.0–5.6)

## 2023-08-10 NOTE — Progress Notes (Signed)
    SUBJECTIVE:   CHIEF COMPLAINT / HPI:   Here to meet new PCP. Would like blood work done. Has long medical history. Feels more tired than usual, would like TSH and iron  checked. Stopped taking iron  supplements and feels she may be deficient. She would also like A1C checked.   Reports she has been having worsening symptoms of her vision and balance and was already referred to neurology and has that appointment in September. She will wait for that appointment to discuss her symptoms. Still having migraines/headaches which is chronic for her but will also discuss this with neurology.  Has having back pain and had MRI done but the MRI had a lot of artifact because she was in pain. Dr. Marlee discussed using ativan to help with repeat MRI and she would like to try that.  Feels her BP is elevated today because she did not take her pain medication and she did not sleep well last night. Reports taking her medications regularly.   Discussed medications and PMHx with patient. Discussed she is on many medications can cause symptoms she is experiencing and I would like to wean those. Patient is being prescribed adderral from another provider, unclear reason why. Discussed she should have discussion with provider to wean off medication as tolerated.   PERTINENT  PMH / PSH:   OBJECTIVE:   BP (!) 140/86   Pulse 91   Ht 5' 4 (1.626 m)   Wt 205 lb 12.8 oz (93.4 kg)   SpO2 94%   BMI 35.33 kg/m   General: A&O, NAD, mildly anxious  HEENT: No sign of trauma, PERRLA  Cardiac: RRR, no m/r/g Respiratory: CTAB, normal WOB, no w/c/r GI: Soft, NTTP, non-distended  Extremities: NTTP Psych: Mildly anxious, appropriate affect    ASSESSMENT/PLAN:   Assessment & Plan Vitamin D  deficiency Checking Vit D level Hypothyroidism (acquired) TSH ordered Other fatigue Will check iron  and CBC  Blood glucose abnormal A1C ordered Impaired gait Reordered MRIs with anti-anxiety medications Osteoporosis,  unspecified osteoporosis type, unspecified pathological fracture presence Home health PT ordered  Essential (primary) hypertension BP elevated on recheck. Patient thinks this is due to pain and not taking pain medication this AM. Will have patient return for recheck in 2 weeks.      Jodi Ogren, MD [facility]

## 2023-08-10 NOTE — Assessment & Plan Note (Signed)
 BP elevated on recheck. Patient thinks this is due to pain and not taking pain medication this AM. Will have patient return for recheck in 2 weeks.

## 2023-08-10 NOTE — Assessment & Plan Note (Signed)
 Will check iron  and CBC

## 2023-08-10 NOTE — Assessment & Plan Note (Signed)
 TSH ordered.

## 2023-08-10 NOTE — Patient Instructions (Addendum)
 It was wonderful to see you today.  Please bring ALL of your medications with you to every visit.   Today we talked about:  I am ordering some lab work for you today. I will call or message you about these results.  I reordered your MRI, but we will wait to hear back from your insurance prior to scheduling.  I also ordered home health PT, I will inform you about that.   Thank you for choosing [facility].   Please call [phone] with any questions about today's appointment.  Please arrive at least 15 minutes prior to your scheduled appointments.   If you had blood work today, I will send you a MyChart message or a letter if results are normal. Otherwise, I will give you a call.   If you had a referral placed, they will call you to set up an appointment. Please give us  a call if you don't hear back in the next 2 weeks.   If you need additional refills before your next appointment, please call your pharmacy first.   Do you need your medications delivered to your home?   We'll send your prescription to the [facility] for delivery.          Address: [address] [place], [place], [place] [address]          Phone: [phone]  Please call the [facility] to speak with a pharmacist and set up your home medication delivery. If you have any questions, feel free to contact us  -- we're happy to help!  Other [facility] that offer affordable prices on both prescriptions and over-the-counter items, as well as convenient services like vaccinations, are  [facility], at [facility]         Address:  [address], [place], [place] [address]         Phone: [phone]  [facility], located in the [facility]        Address: [address], [place], [place] [address]        Phone: [phone]  [facility], at [facility]       Address: [address], [place], [place] [address]       Phone: [phone]  [facility], at [facility]       Address: [address], [place], [place] [address]       Phone: [phone]  You should follow up in our clinic in Return in about 2 weeks (around 08/24/2023).  Gloriann Ogren, MD Family Medicine

## 2023-08-11 ENCOUNTER — Ambulatory Visit: Payer: Self-pay | Admitting: Family Medicine

## 2023-08-11 LAB — CBC WITH DIFFERENTIAL/PLATELET
Basophils Absolute: 0.1 x10E3/uL (ref 0.0–0.2)
Basos: 1 %
EOS (ABSOLUTE): 0.5 x10E3/uL — ABNORMAL HIGH (ref 0.0–0.4)
Eos: 6 %
Hematocrit: 41.8 % (ref 34.0–46.6)
Hemoglobin: 13.9 g/dL (ref 11.1–15.9)
Immature Grans (Abs): 0.1 x10E3/uL (ref 0.0–0.1)
Immature Granulocytes: 1 %
Lymphocytes Absolute: 1.3 x10E3/uL (ref 0.7–3.1)
Lymphs: 17 %
MCH: 32.7 pg (ref 26.6–33.0)
MCHC: 33.3 g/dL (ref 31.5–35.7)
MCV: 98 fL — ABNORMAL HIGH (ref 79–97)
Monocytes Absolute: 0.7 x10E3/uL (ref 0.1–0.9)
Monocytes: 9 %
Neutrophils Absolute: 5.2 x10E3/uL (ref 1.4–7.0)
Neutrophils: 66 %
Platelets: 247 x10E3/uL (ref 150–450)
RBC: 4.25 x10E6/uL (ref 3.77–5.28)
RDW: 12.8 % (ref 11.7–15.4)
WBC: 7.7 x10E3/uL (ref 3.4–10.8)

## 2023-08-11 LAB — TSH+FREE T4
Free T4: 0.62 ng/dL — ABNORMAL LOW (ref 0.82–1.77)
TSH: 13 u[IU]/mL — ABNORMAL HIGH (ref 0.450–4.500)

## 2023-08-11 LAB — VITAMIN D 25 HYDROXY (VIT D DEFICIENCY, FRACTURES): Vit D, 25-Hydroxy: 39.7 ng/mL (ref 30.0–100.0)

## 2023-08-11 MED ORDER — LEVOTHYROXINE SODIUM 175 MCG PO TABS
175.0000 ug | ORAL_TABLET | Freq: Every day | ORAL | 0 refills | Status: DC
Start: 2023-08-11 — End: 2023-09-23

## 2023-08-11 NOTE — Addendum Note (Signed)
 Addended byBETHA LONNIE EARNEST on: 08/11/2023 08:51 AM   Modules accepted: Orders

## 2023-08-12 ENCOUNTER — Encounter: Payer: Self-pay | Admitting: Family Medicine

## 2023-08-15 ENCOUNTER — Encounter: Payer: Self-pay | Admitting: Family Medicine

## 2023-08-18 ENCOUNTER — Ambulatory Visit (HOSPITAL_COMMUNITY)

## 2023-08-18 ENCOUNTER — Encounter: Payer: Self-pay | Admitting: Family Medicine

## 2023-08-22 ENCOUNTER — Other Ambulatory Visit: Payer: Self-pay | Admitting: *Deleted

## 2023-08-23 ENCOUNTER — Other Ambulatory Visit: Payer: Self-pay

## 2023-08-23 ENCOUNTER — Other Ambulatory Visit: Payer: Self-pay | Admitting: Orthopaedic Surgery

## 2023-08-23 ENCOUNTER — Encounter: Payer: Self-pay | Admitting: Family Medicine

## 2023-08-23 DIAGNOSIS — M810 Age-related osteoporosis without current pathological fracture: Secondary | ICD-10-CM

## 2023-08-23 DIAGNOSIS — R110 Nausea: Secondary | ICD-10-CM

## 2023-08-23 MED ORDER — RIZATRIPTAN BENZOATE 10 MG PO TABS: 10.0000 mg | ORAL_TABLET | ORAL | 2 refills | Status: DC | PRN

## 2023-08-23 MED ORDER — ATORVASTATIN CALCIUM 40 MG PO TABS: 40.0000 mg | ORAL_TABLET | Freq: Every day | ORAL | 3 refills | Status: AC

## 2023-08-24 ENCOUNTER — Ambulatory Visit (HOSPITAL_COMMUNITY): Admission: RE | Admit: 2023-08-24 | Source: Ambulatory Visit

## 2023-08-24 ENCOUNTER — Other Ambulatory Visit: Payer: Self-pay | Admitting: Orthopaedic Surgery

## 2023-08-24 ENCOUNTER — Ambulatory Visit (HOSPITAL_COMMUNITY)

## 2023-08-24 MED ORDER — ALENDRONATE SODIUM 10 MG PO TABS
10.0000 mg | ORAL_TABLET | Freq: Every day | ORAL | 3 refills | Status: AC
Start: 2023-08-24 — End: ?

## 2023-08-24 MED ORDER — ONDANSETRON HCL 4 MG PO TABS: 4.0000 mg | ORAL_TABLET | Freq: Three times a day (TID) | ORAL | 0 refills | Status: DC | PRN

## 2023-08-24 NOTE — Progress Notes (Deleted)
 BH MD Outpatient Progress Note  Date of visit: 06/24/2023 Jodi Gilmore  MRN:  [id number]   Assessment:  Jodi Gilmore presents for follow-up evaluation. ***   Plan:  # Bipolar disorder, GAD Interventions: -- Continue Paxil  40 mg daily - Continue buspirone  10 mg 3 times daily -- Continue Seroquel  400 mg at bedtime  # ADHD -- Continue Adderall 10 mg BID  Patient was previously prescribed Vicodin but reports stopping this medication in 05/2023  Patient was given contact information for behavioral health clinic and was instructed to call 911 for emergencies.    Identifying Information: Jodi Gilmore is a 68 y.o. y.o. female with a history of BPAD, GAD, and ADHD who is an established patient with [facility] for management of anxiety and depression.   Subjective:  Chief Complaint:  No chief complaint on file.   Interval History:  Patient seen ***.  Patient reports feeling *** today. Since the previous visit, ***. Regarding medications, patient notes ***. Patient reports the following adverse effects: ***.   Patient reports *** sleep, ***. Patient reports *** appetite, ***.   Patient rates anxiety a ***/10, depression a ***/10, and anger a ***/10.   Patient denies current SI, HI, and AVH. ***  Stressors include ***.   Substance use: ***   Visit Diagnosis:  No diagnosis found.    Past Psychiatric History:  Previous admit 40 years ago is [place]  for severe depression. Patient denies prior suicide attempts or self-harm.   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    Hip pain    HLA B27 (HLA B27 positive)    Hyperlipidemia    Hypertension    Knee pain    Memory changes    Morphea scleroderma    Neuromuscular disorder (HCC)    Raynauds    Osteoporosis    Pneumonia    Psoriatic arthritis (HCC)    seen in ears only   Raynaud's disease    Sjogren syndrome with dental involvement (HCC)     Past Surgical History:  Procedure Laterality Date   CYST REMOVAL HAND     left arm posterior   laproscopy     SALPINGECTOMY  1980   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   Procedure: LEFT TOTAL HIP ARTHROPLASTY ANTERIOR APPROACH;  Surgeon: Vernetta Lonni GRADE, MD;  Location: [facility];  Service: Orthopedics;  Laterality: Left;   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       Family History:  Family History  Problem Relation Age of Onset   Heart disease Mother    Heart attack Mother    Congestive Heart Failure Mother    Dementia Mother    Heart disease Father 82   Heart attack  Father    Dementia Sister    Hemachromatosis Sister    Parkinson's disease Sister    Allergies Son    Colon cancer Neg Hx    Colon polyps Neg Hx    Esophageal cancer Neg Hx    Stomach cancer Neg Hx    Rectal cancer Neg Hx    BRCA 1/2 Neg Hx    Breast cancer Neg Hx     Social History:  Social History   Socioeconomic History   Marital status: Single    Spouse name: Not on file   Number of children: 1   Years of education: Not on file   Highest education level: Some college, no degree  Occupational History   Occupation: Unemployed  Tobacco Use   Smoking status: Former    Passive exposure: Past   Smokeless tobacco: Never  Vaping Use   Vaping status: Never Used  Substance and Sexual Activity   Alcohol  use: Not Currently   Drug use: Never   Sexual activity: Not Currently   Other Topics Concern   Not on file  Social History Narrative   Not on file   Social Drivers of Health   Financial Resource Strain: High Risk (07/15/2023)   Overall Financial Resource Strain (CARDIA)    Difficulty of Paying Living Expenses: Hard  Food Insecurity: Food Insecurity Present (07/15/2023)   Hunger Vital Sign    Worried About Running Out of Food in the Last Year: Sometimes true    Ran Out of Food in the Last Year: Patient declined  Transportation Needs: Unmet Transportation Needs (07/15/2023)   PRAPARE - Administrator, Civil Service (Medical): Yes    Lack of Transportation (Non-Medical): Yes  Physical Activity: Inactive (07/15/2023)   Exercise Vital Sign    Days of Exercise per Week: 0 days    Minutes of Exercise per Session: Not on file  Stress: Stress Concern Present (07/15/2023)   [employer] of Occupational Health - Occupational Stress Questionnaire    Feeling of Stress: To some extent  Social Connections: Unknown (07/15/2023)   Social Connection and Isolation Panel    Frequency of Communication with Friends and Family: Once a week    Frequency of Social Gatherings with Friends and Family: Once a week    Attends Religious Services: Never    Database administrator or Organizations: No    Attends Engineer, structural: Not on file    Marital Status: Patient declined    Allergies:  Allergies  Allergen Reactions   Iodinated Contrast Media Anaphylaxis   Prohance [Gadoteridol] Anaphylaxis   Ioversol Other (See Comments)   Latex Hives and Rash   Ptu [Propylthiouracil] Other (See Comments)   Sulfa Antibiotics Hives and Other (See Comments)    Unknown reaction, family history   Tapazole [Methimazole] Other (See Comments)    Current Medications: Current Outpatient Medications  Medication Sig Dispense Refill   alendronate  (FOSAMAX ) 10 MG tablet Take 1 tablet (10 mg total) by mouth daily before breakfast. Take with a full glass of water on an empty  stomach. 90 tablet 3   amphetamine -dextroamphetamine  (ADDERALL) 10 MG tablet Take 1 tablet (10 mg total) by mouth 2 (two) times daily with a meal. 60 tablet 0   [START ON 08/28/2023] amphetamine -dextroamphetamine  (ADDERALL) 10 MG tablet Take 1 tablet (10 mg total) by mouth 2 (two) times daily with a meal. 60 tablet 0   atorvastatin  (LIPITOR) 40 MG tablet Take 1 tablet (40 mg total) by  mouth daily. 90 tablet 3   busPIRone  (BUSPAR ) 10 MG tablet Take 1 tablet (10 mg total) by mouth 3 (three) times daily. 90 tablet 2   Calcium  Carb-Cholecalciferol  (CALCIUM  1000 + D) 1000-20 MG-MCG TABS Take 1 tablet by mouth daily. 30 tablet 2   cetirizine  (ZYRTEC  ALLERGY) 10 MG tablet Take 1 tablet (10 mg total) by mouth daily. 90 tablet 1   clotrimazole  (LOTRIMIN ) 1 % cream Apply 1 Application topically 2 (two) times daily. To right underarm 30 g 0   colchicine  0.6 MG tablet Take 1 tablet (0.6 mg total) by mouth daily. 90 tablet 3   fluticasone  (FLONASE ) 50 MCG/ACT nasal spray USE 2 SPRAYS IN EACH NOSTRIL ONCE TIME DAILY 16 mL 6   levothyroxine  (EUTHYROX ) 175 MCG tablet Take 1 tablet (175 mcg total) by mouth daily before breakfast. 60 tablet 0   lidocaine  (XYLOCAINE ) 2 % solution Swish and spit 15mL no more than every 4 hours for oral ulcer pain. 100 mL 3   methocarbamol  (ROBAXIN ) 500 MG tablet TAKE ONE TABLET BY MOUTH EVERY 6 HOURS AS NEEDED FOR MUSCLE SPASM 40 tablet 1   omeprazole  (PRILOSEC) 40 MG capsule Take 1 capsule (40 mg total) by mouth in the morning and at bedtime. 180 capsule 3   ondansetron  (ZOFRAN ) 4 MG tablet Take 1 tablet (4 mg total) by mouth every 8 (eight) hours as needed for nausea or vomiting. 40 tablet 0   PARoxetine  (PAXIL ) 40 MG tablet Take 1 tablet (40 mg total) by mouth daily. 90 tablet 1   QUEtiapine  (SEROQUEL ) 400 MG tablet Take 1 tablet (400 mg total) by mouth at bedtime. 90 tablet 1   RESTASIS  0.05 % ophthalmic emulsion INSTILL ONE DROP INTO EACH EYE TWICE DAILY 60 each 3   rizatriptan   (MAXALT ) 10 MG tablet Take 1 tablet (10 mg total) by mouth as needed for migraine. May repeat in 2 hours if needed. Do not take more than THREE doses in one day. 30 tablet 2   valACYclovir  (VALTREX ) 500 MG tablet TAKE ONE TABLET BY MOUTH TWICE A DAY FOR 7 DAYS 30 tablet 2   verapamil  (CALAN -SR) 120 MG CR tablet Take 1 tablet (120 mg total) by mouth daily. 90 tablet 1   No current facility-administered medications for this visit.     Objective:  Psychiatric Specialty Exam: General Appearance: appears at stated age, casually dressed and groomed ***  Behavior: pleasant and cooperative ***  Psychomotor Activity: no psychomotor agitation or retardation noted ***  Eye Contact: fair *** Speech: normal amount, volume and fluency ***   Mood: euthymic *** Affect: congruent, pleasant and interactive ***  Thought Process: linear, goal directed, no circumstantial or tangential thought process noted, no racing thoughts or flight of ideas *** Descriptions of Associations: intact ***  Thought Content Hallucinations: denies AH, VH , does not appear responding to stimuli *** Delusions: no paranoia, delusions of control, grandeur, ideas of reference, thought broadcasting, and magical thinking *** Suicidal Thoughts: denies SI, intention, plan *** Homicidal Thoughts: denies HI, intention, plan ***  Alertness/Orientation: alert and fully oriented ***  Insight: fair*** Judgment: fair***  Memory: intact ***  Executive Functions  Concentration: intact *** Attention Span: fair *** Recall: intact *** Fund of Knowledge: fair ***  Physical Exam *** General: Pleasant, well-appearing ***. No acute distress. Pulmonary: Normal effort. No wheezing or rales. Skin: No obvious rash or lesions. Neuro: A&Ox3.No focal deficit.  Review of Systems *** No reported symptoms    Metabolic Disorder Labs:  Lab Results  Component Value Date   HGBA1C 5.3 08/10/2023   No results found for: PROLACTIN Lab  Results  Component Value Date   CHOL 167 05/24/2023   TRIG 232 (H) 05/24/2023   HDL 48 05/24/2023   CHOLHDL 3.5 05/24/2023   LDLCALC 81 05/24/2023   LDLCALC 175 (H) 02/12/2020   Lab Results  Component Value Date   TSH 13.000 (H) 08/10/2023   TSH 3.390 05/24/2023    Therapeutic Level Labs: No results found for: LITHIUM No results found for: VALPROATE No results found for: CBMZ  Screenings: GAD-7    Flowsheet Row Video Visit from 08/26/2021 in [facility] Video Visit from 06/24/2021 in [facility] Video Visit from 04/22/2021 in [facility] Video Visit from 03/20/2021 in [facility] Video Visit from 02/19/2021 in [facility]  Total GAD-7 Score 9 10 14 13 11    PHQ2-9    Flowsheet Row Office Visit from 05/24/2023 in [facility] [place]. [facility] Office Visit from 03/22/2023 in [facility] [place]. [facility] Office Visit from 08/10/2022 in [facility] Of [place]. [facility] Office Visit from 06/29/2022 in [facility] Of [place]. [facility] Clinical Support from 03/09/2022 in [facility] Of [place]. [facility]  PHQ-2 Total Score 4 5 2 6 2   PHQ-9 Total Score 12 22 15 19 15    Flowsheet Row UC from 12/11/2022 in [facility] Video Visit from 08/26/2021 in [facility] Video Visit from 06/24/2021 in [facility]  C-SSRS RISK CATEGORY No Risk Low Risk Low Risk    [name], MD PGY-3 Psychiatry Resident

## 2023-08-25 ENCOUNTER — Other Ambulatory Visit: Payer: Self-pay | Admitting: Orthopaedic Surgery

## 2023-08-31 ENCOUNTER — Ambulatory Visit (HOSPITAL_COMMUNITY)
Admission: RE | Admit: 2023-08-31 | Discharge: 2023-08-31 | Disposition: A | Discharge status: Home / Self Care | Source: Ambulatory Visit | Attending: Family Medicine | Admitting: Family Medicine

## 2023-08-31 DIAGNOSIS — R269 Unspecified abnormalities of gait and mobility: Secondary | ICD-10-CM | POA: Diagnosis present

## 2023-09-02 ENCOUNTER — Encounter (HOSPITAL_COMMUNITY): Admitting: Psychiatry

## 2023-09-05 ENCOUNTER — Encounter: Payer: Self-pay | Admitting: Family Medicine

## 2023-09-05 ENCOUNTER — Telehealth (INDEPENDENT_AMBULATORY_CARE_PROVIDER_SITE_OTHER): Admitting: Family Medicine

## 2023-09-05 ENCOUNTER — Encounter: Payer: Self-pay | Admitting: Cardiology

## 2023-09-05 VITALS — Ht 64.0 in | Wt 205.0 lb

## 2023-09-05 DIAGNOSIS — M810 Age-related osteoporosis without current pathological fracture: Secondary | ICD-10-CM

## 2023-09-05 DIAGNOSIS — I10 Essential (primary) hypertension: Secondary | ICD-10-CM | POA: Diagnosis present

## 2023-09-05 DIAGNOSIS — Z599 Problem related to housing and economic circumstances, unspecified: Secondary | ICD-10-CM

## 2023-09-05 DIAGNOSIS — G8929 Other chronic pain: Secondary | ICD-10-CM | POA: Diagnosis not present

## 2023-09-05 MED ORDER — LISINOPRIL 2.5 MG PO TABS: 2.5000 mg | ORAL_TABLET | Freq: Every day | ORAL | 3 refills | Status: DC

## 2023-09-05 MED ORDER — CYCLOSPORINE 0.05 % OP EMUL: 1.0000 [drp] | Freq: Two times a day (BID) | OPHTHALMIC | 3 refills | Status: DC

## 2023-09-05 MED ORDER — TRAMADOL HCL 50 MG PO TABS: 25.0000 mg | ORAL_TABLET | Freq: Four times a day (QID) | ORAL | 0 refills | Status: DC | PRN

## 2023-09-05 NOTE — Assessment & Plan Note (Signed)
 Appropriate to restart lisinopril  2.5 mg daily. Sent to pharmacy.

## 2023-09-05 NOTE — Progress Notes (Signed)
 [facility] Telemedicine Visit  Patient consented to have virtual visit and was identified by name and date of birth. Method of visit: Video, phone  Encounter participants: Patient: Jodi Gilmore - located at home Provider: Lauraine Norse - located at [facility] Others (if applicable): N/A  Chief Complaint: see below  Homebound  Currently pays out of pocket for home services. She is hopeful she is eligible for these services through medicare. Needs someone to Take her to appts, help with cooking, laundry, cleaning.  Restasis  Requests refill.  Verapamil  to lisinopril  Switched due to prophylactic for migraines 6 months ago, not helping and BP is still up per pt. Lisinopril  was doing better for her BP she reports. States she does not check her BP at home.  Tramadol  Stopped hydrocodone  in April by herself. Was getting shots from neuro spine people and offered ablation but is concerned about this. At some point stopped following with neuro spine but it is unclear when.  She is currently taking Tylenol  500 mg x6 daily, Ibuprofen 200 mg x10-12  daily. She is concerned that this is too much and that she will get fatty liver.  ROS: per HPI.  Pertinent PMHx: Reviewed. HTN, hypothyroid, bipolar disorder, anxiety, fibromyalgia.  Exam:  Ht 5' 4 (1.626 m)   Wt 205 lb (93 kg)   BMI 35.19 kg/m   Respiratory: normal WOB   Assessment & Plan Essential (primary) hypertension Appropriate to restart lisinopril  2.5 mg daily. Sent to pharmacy. Financial difficulties I am unsure about services that insurance will cover for this pt though I understand her need for assistance. - [facility] referral placed today for assistance. Other chronic pain She takes tylenol  and ibuprofen daily and is very uncomfortable. She has been on tramadol  in the past. It is unclear to me today why she does not follow with pain management currently, but I do think they would be the ones best equipped to  help her. I also have concern about combining tramadol  with some of her other medications, however I believe a short course to get her through while she tries to get back with pain management is reasonable. Risks and benefits discussed. - tramadol  25 mg every 6-8 hours as needed for significant pain - may continue to use tylenol /ibuprofen - should contact pain management clinic and schedule with them as soon as possible.  She does need to return to clinic in 2 weeks to recheck TSH for synthroid  titration; this would be a good time to follow up on the issues above as well as other concerns she had today that could not be addressed. Offered to make appt for patient and she declined stating that 2 weeks was too early to come in; I requested that she call us  back in the next 2-4 weeks to schedule follow up.  Time spent during visit with patient: >60 minutes

## 2023-09-14 ENCOUNTER — Encounter: Payer: Self-pay | Admitting: Cardiology

## 2023-09-15 ENCOUNTER — Other Ambulatory Visit: Payer: Self-pay

## 2023-09-15 ENCOUNTER — Encounter (HOSPITAL_COMMUNITY): Payer: Self-pay

## 2023-09-15 MED ORDER — RIZATRIPTAN BENZOATE 10 MG PO TABS: 10.0000 mg | ORAL_TABLET | ORAL | 2 refills | Status: DC | PRN

## 2023-09-15 NOTE — Telephone Encounter (Signed)
 Chart reviewed. Rx refilled with updated patient sig.

## 2023-09-16 ENCOUNTER — Telehealth: Payer: Self-pay

## 2023-09-16 NOTE — Telephone Encounter (Signed)
 Pharmacy calls nurse line in regards to Rizatriptan .   He reports for insurance purposes they need to know how many margines the patient is having a month.   Discussed with patient, she reports 3-4 times per week. She reports an upcoming  Neurology apt.   Called [facility] and gave update, they are processing the prescription now.

## 2023-09-20 ENCOUNTER — Encounter (HOSPITAL_COMMUNITY): Payer: Self-pay

## 2023-09-20 ENCOUNTER — Inpatient Hospital Stay (HOSPITAL_COMMUNITY): Admission: RE | Admit: 2023-09-20 | Source: Ambulatory Visit

## 2023-09-21 ENCOUNTER — Other Ambulatory Visit: Payer: Self-pay | Admitting: Student

## 2023-09-21 ENCOUNTER — Telehealth (HOSPITAL_COMMUNITY): Payer: Self-pay

## 2023-09-21 ENCOUNTER — Other Ambulatory Visit (HOSPITAL_COMMUNITY): Payer: Self-pay | Admitting: Psychiatry

## 2023-09-21 ENCOUNTER — Other Ambulatory Visit (HOSPITAL_COMMUNITY): Payer: Self-pay | Admitting: Student

## 2023-09-21 DIAGNOSIS — F909 Attention-deficit hyperactivity disorder, unspecified type: Secondary | ICD-10-CM

## 2023-09-21 MED ORDER — AMPHETAMINE-DEXTROAMPHETAMINE 10 MG PO TABS: 10.0000 mg | ORAL_TABLET | Freq: Two times a day (BID) | ORAL | 0 refills | Status: DC

## 2023-09-21 NOTE — Progress Notes (Signed)
 Received refill request from pharmacy for this patient's Adderall. PDMP shows medication will run out next week and patient filling appropriately. I sent in the medication to bridge patient for the next appointment on 10/2.  [name], MD PGY-3 Psychiatry Resident

## 2023-09-21 NOTE — Telephone Encounter (Signed)
 Patient called and left voicemail requesting refill of her Adderall 10 mg. Her Medication should run out next week. Patient has appointment with Dr. Izella on 10/13/2023. Last seen 06/24/23 by Dr. Marry.

## 2023-09-22 ENCOUNTER — Other Ambulatory Visit: Payer: Self-pay | Admitting: Family Medicine

## 2023-09-22 ENCOUNTER — Encounter: Payer: Self-pay | Admitting: Family Medicine

## 2023-09-22 ENCOUNTER — Telehealth: Payer: Self-pay | Admitting: *Deleted

## 2023-09-22 DIAGNOSIS — R110 Nausea: Secondary | ICD-10-CM

## 2023-09-22 NOTE — Progress Notes (Unsigned)
 Complex Care Management Note Care Guide Note  09/22/2023 Name: Jodi Gilmore MRN: [id number] DOB: [birth year]   Complex Care Management Outreach Attempts: An unsuccessful telephone outreach was attempted today to offer the patient information about available complex care management services.  Follow Up Plan:  Additional outreach attempts will be made to offer the patient complex care management information and services.   Encounter Outcome:  Patient Request to Call Back  [name]  [facility]  [facility], [facility] Guide  Direct Dial: [phone]  Fax [phone]

## 2023-09-23 ENCOUNTER — Other Ambulatory Visit: Payer: Self-pay

## 2023-09-23 ENCOUNTER — Encounter: Payer: Self-pay | Admitting: Family Medicine

## 2023-09-23 ENCOUNTER — Other Ambulatory Visit: Payer: Self-pay | Admitting: Family Medicine

## 2023-09-23 DIAGNOSIS — E039 Hypothyroidism, unspecified: Secondary | ICD-10-CM

## 2023-09-23 MED ORDER — LEVOTHYROXINE SODIUM 175 MCG PO TABS: 175.0000 ug | ORAL_TABLET | Freq: Every day | ORAL | 0 refills | Status: DC

## 2023-09-23 NOTE — Telephone Encounter (Signed)
 Chart reviewed. Rx refilled. Per PCP plan, needs lab recheck.

## 2023-09-23 NOTE — Progress Notes (Signed)
 Complex Care Management Note  Care Guide Note 09/23/2023 Name: Jodi Gilmore MRN: [id number] DOB: April 13, 1955  Jodi Gilmore is a 68 y.o. year old female who sees Baloch, Mahnoor, MD for primary care. I reached out to [name] by phone today to offer complex care management services.  Jodi Gilmore was given information about Complex Care Management services today including:   The Complex Care Management services include support from the care team which includes your Nurse Care Manager, Clinical Social Worker, or Pharmacist.  The Complex Care Management team is here to help remove barriers to the health concerns and goals most important to you. Complex Care Management services are voluntary, and the patient may decline or stop services at any time by request to their care team member.   Complex Care Management Consent Status: Patient agreed to services and verbal consent obtained.   Follow up plan:  Telephone appointment with complex care management team member scheduled for:  09/30/23 with RNCM and BSW 10/03/23  Encounter Outcome:  Patient Scheduled  [name]  [facility] Health  [facility], [facility] Guide  Direct Dial: [phone]  Fax [phone]

## 2023-09-23 NOTE — Telephone Encounter (Signed)
 Chart reviewed. Rx refilled.

## 2023-09-26 ENCOUNTER — Other Ambulatory Visit: Payer: Self-pay | Admitting: *Deleted

## 2023-09-26 ENCOUNTER — Encounter: Payer: Self-pay | Admitting: Neurology

## 2023-09-26 DIAGNOSIS — M810 Age-related osteoporosis without current pathological fracture: Secondary | ICD-10-CM

## 2023-09-26 MED ORDER — CALCIUM 1000 + D 1000-20 MG-MCG PO TABS: 1.0000 | ORAL_TABLET | Freq: Every day | ORAL | 2 refills | Status: DC

## 2023-09-27 ENCOUNTER — Encounter: Payer: Self-pay | Admitting: Family Medicine

## 2023-09-27 ENCOUNTER — Other Ambulatory Visit: Payer: Self-pay | Admitting: Family Medicine

## 2023-09-27 DIAGNOSIS — R61 Generalized hyperhidrosis: Secondary | ICD-10-CM

## 2023-09-28 ENCOUNTER — Encounter: Payer: Self-pay | Admitting: Orthopaedic Surgery

## 2023-09-28 ENCOUNTER — Other Ambulatory Visit (INDEPENDENT_AMBULATORY_CARE_PROVIDER_SITE_OTHER): Payer: Self-pay

## 2023-09-28 ENCOUNTER — Ambulatory Visit (INDEPENDENT_AMBULATORY_CARE_PROVIDER_SITE_OTHER): Admitting: Orthopaedic Surgery

## 2023-09-28 DIAGNOSIS — M25572 Pain in left ankle and joints of left foot: Secondary | ICD-10-CM

## 2023-09-28 DIAGNOSIS — M25571 Pain in right ankle and joints of right foot: Secondary | ICD-10-CM

## 2023-09-28 MED ORDER — METHOCARBAMOL 500 MG PO TABS: 500.0000 mg | ORAL_TABLET | Freq: Three times a day (TID) | ORAL | 0 refills | Status: DC | PRN

## 2023-09-28 NOTE — Progress Notes (Signed)
 The patient is a 68 year old female that we have not seen since April of last year.  We actually replaced her left hip in October 2022 secondary to arthritis.  Since we have seen her last she has been to neurosurgery.  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  She has been seen by neurology as well and also has a diagnosis of osteoporosis.  She even has nerve conduction studies that were done at [facility] of her upper extremities but they not available to be seen on the epic system and I did try to really look these up.  She is requesting Robaxin  because she said that has helped in the past.  I said I am cautious about wanting to give that type of medication and would only do this on a one-time basis today.  She reports bilateral hand pain and burning.  She reports bilateral foot pain and really like to have x-rays of both her feet because of having multiple fractures in the past.  I did review all of her notes from her primary care physician as well as her medications.  She is on Fosamax  from an osteoporosis standpoint.  Examination of both her ankles and feet today showed no abnormalities on exam.  She does have a lot of pain but there is no instability on examining her ankles and no blocks rotation or flexion or extension.  There is just global pain overall.  Note she does report that she is a fall risk and I do believe a lot of this is related to her cervical and lumbar stenosis.  X-rays today of both feet showed no arthritic changes.  There is no evidence of previous fractures.  From what can be seen on the ankle joints have seen no arthritic changes or worrisome findings.  This did give her reassurance.  I do think that she should only ambulate using a walker given her fall risk.  I did recommend that she follow-up with her neurosurgeons given the degree of stenosis in her cervical and lumbar spine.

## 2023-09-29 ENCOUNTER — Encounter: Payer: Self-pay | Admitting: Family Medicine

## 2023-09-29 ENCOUNTER — Encounter: Payer: Self-pay | Admitting: Internal Medicine

## 2023-09-29 NOTE — Telephone Encounter (Signed)
 Left message for Lia to call me back.

## 2023-09-30 ENCOUNTER — Telehealth: Payer: Self-pay | Admitting: *Deleted

## 2023-09-30 ENCOUNTER — Ambulatory Visit: Payer: Self-pay | Admitting: Family Medicine

## 2023-09-30 NOTE — Progress Notes (Signed)
 Complex Care Management Care Guide Note  09/30/2023 Name: Jodi Gilmore MRN: [id number] DOB: [birth year]  Jodi Gilmore is a 68 y.o. year old female who is a primary care patient of Baloch, Mahnoor, MD and is actively engaged with the care management team. I reached out to Jodi Gilmore by phone today to assist with re-scheduling  with the RN Case Manager.  Follow up plan: Unsuccessful telephone outreach attempt made.  Jodi Gilmore  [facility]  [facility], [facility] Guide  Direct Dial: [phone]  Fax [phone]

## 2023-09-30 NOTE — Progress Notes (Signed)
 Noted chest x-ray results. Findings of possible bronchiolitis, an inflammation of the airways that can be caused by a viral infection. No evidence of malignancy or pneumonia. If symptoms of shortness of breath, congestion, cough, or other viral symptoms, would recommend being seen for an appointment. Would also recommend follow up for night sweats for which I believe the x-ray was originally ordered.

## 2023-09-30 NOTE — Telephone Encounter (Signed)
 Left message for Lia to call me back.

## 2023-10-03 ENCOUNTER — Other Ambulatory Visit: Payer: Self-pay

## 2023-10-03 NOTE — Patient Outreach (Signed)
 Complex Care Management   Visit Note  10/03/2023  Name:  Jodi Gilmore MRN: [id number] DOB: 1955/07/25  Situation: Referral received for Complex Care Management related to SDOH Barriers:  Transportation Housing finding new apartment lease ends in December 2025 Food insecurity Physicist, medical Strain coverage for [employer] and increased hours.  I obtained verbal consent from Patient.  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 Pt was alert and cognitive. SDOH needs were assessed and the following needs were confirmed by pt: food insecurity, housing, transportation, financial resource strain, and need for increased PCS hours/coverage. Pt reports she does not have a big food insecurity, but was acceptable to food pantries and resources for this need. BSW encouraged pt to apply for FNS and a paper application was provided via email. Pt reports she would like to find housing since her apartment complex will not renew her lease at the end of December 2025. Pt was  provided with housing options over email. BSW also encouraged pt to apply for medicaid to help cover PCS and avoid having to pay out of pocket. Pt was provided contact info and address for [facility] in [place] to apply. No other resources were provided/requested at this time.   SDOH Interventions    Flowsheet Row Patient Outreach Telephone from 10/03/2023 in [place] POPULATION HEALTH DEPARTMENT Telephone from 04/08/2023 in [place] POPULATION HEALTH DEPARTMENT Telephone from 03/10/2022 in [facility] Community Care Coordination Clinical Support from 03/09/2022 in [facility] - A Dept Of [place]. [facility] Office Visit from 01/01/2020 in [facility] - A Dept Of [place]  SDOH Interventions       Food Insecurity Interventions Community Resources Provided -- -- Intervention Not Indicated --  Housing Interventions Intervention Not Indicated -- -- Intervention Not Indicated --  Transportation Interventions Patient Resources (Friends/Family)  [relies on home care aid to provide transportation.] [id number] Referral, Community Resources Provided Other (Comment)  Financial risk analyst for [employer] transportation.] [id number] Referral --  Utilities Interventions Intervention Not Indicated Community Resources Provided, [id number] Referral -- Intervention Not Indicated --  Alcohol  Usage Interventions -- -- --  Intervention Not Indicated (Score <7) --  Depression Interventions/Treatment  -- -- -- Currently on Treatment Counseling, Medication  Financial Strain Interventions -- [employer] Provided, [id number] Referral -- Intervention Not Indicated --  Physical Activity Interventions -- -- -- Intervention Not Indicated --  Stress Interventions -- -- -- Intervention Not Indicated --  Social Connections Interventions -- -- -- Intervention Not Indicated --    Recommendation:   Apply for FNS and Medicaid with DSS.   Follow  Up Plan:   Telephone follow up appointment date/time:  10/18/2023 at 2pm  Laymon Doll, [place]/VBCI - [facility] Social Worker [phone]

## 2023-10-03 NOTE — Patient Instructions (Signed)
 Visit Information  Thank you for taking time to visit with me today. Please don't hesitate to contact me if I can be of assistance to you before our next scheduled appointment.  Our next appointment is by telephone on 10/18/2023 at 2pm Please call the care guide team at [phone] if you need to cancel or reschedule your appointment.   Following is a copy of your care plan:   Goals Addressed             This Visit's Progress    BSW VBCI Social Work Care Plan   On track    Problems:   Corporate treasurer , Geophysicist/field seismologist , Housing , Transportation, and increased hours for [employer] and coverage.   CSW Clinical Goal(s):   Over the next 2 weeks the Patient will work with Child psychotherapist to address concerns related to food insecurity, housing, transportation, and increased hours for PCS/coverage..  Interventions:  BSW will provided resources for SDOH needs.   Patient Goals/Self-Care Activities:  Patient was encouraged to apply for FNS and Medicaid for PCS coverage.   Plan:   Telephone follow up appointment with care management team member scheduled for:  10/07/025 at 2pm.        Please call the Suicide and Crisis Lifeline: 988 go to [facility] Urgent [facility] [address] [place] [phone]) call 911 if you are experiencing a Mental Health or Behavioral Health Crisis or need someone to talk to.  Patient verbalizes understanding of instructions and care plan provided today and agrees to view in MyChart. Active MyChart status and patient understanding of how to access instructions and care plan via MyChart confirmed with patient.     Laymon Doll, BSW [place]/VBCI - [employer] Social Worker [phone]

## 2023-10-04 ENCOUNTER — Ambulatory Visit: Admitting: Neurology

## 2023-10-04 NOTE — Progress Notes (Signed)
 Complex Care Management Care Guide Note  10/04/2023 Name: Jodi Gilmore MRN: [id number] DOB: Jul 26, 1955  Jodi Gilmore is a 68 y.o. year old female who is a primary care patient of Baloch, Mahnoor, MD and is actively engaged with the care management team. I reached out to [name] by phone today to assist with re-scheduling  with the RN Case Manager.  Follow up plan: Telephone appointment with complex care management team member scheduled for:  10/07/23  [name]  [facility]  [facility] Guide  Direct Dial: [phone]  Fax [phone]

## 2023-10-04 NOTE — Telephone Encounter (Signed)
 Lia returned call & stated order would need to go to [facility] (Fax: [phone]) & will require documentation stating why services is needed in home, along with order/demographics. Message sent to patient.

## 2023-10-04 NOTE — Progress Notes (Signed)
 BH MD Outpatient Progress Note  Date of visit: 06/24/2023 Jodi Gilmore  MRN:  [id number]  Assessment:  Jodi Gilmore presents for follow-up evaluation. Patient reports doing fairly since her previous visit, utilizing her coping strategies appropriately. I discussed the adverse effects of staying on Paxil  and she is open to cross titrating to Prozac  as she found prior benefit on Prozac  and is unsure as to why she stopped the medication in the past. In future visits, plan can be to decrease Seroquel  so long as her mood continues to be stable as his medication can also be causing weight gain and affecting thyroid  levels. We discussed that patient needs to obtain UDS from her PCP as she is currently on Adderall and she is amenable. F/u in 3 weeks.   Identifying Information: Jodi Gilmore is a 68 y.o. y.o. female with a history of bipolar disorder, GAD, and ADHD who is an established patient with [facility] for management of anxiety and depression.   Plan:  # Bipolar disorder, GAD -- Decrease Paxil  40 mg daily -> 30 mg for two weeks -> 20 mg daily -- Start Prozac  10 mg daily - Continue buspirone  10 mg 3 times daily -- Continue Seroquel  400 mg at bedtime  -Labs updated on 07/2023  # ADHD -- Continue Adderall 10 mg BID  - PDMP checked  - Plan to obtain UDS from PCP  Patient was given contact information for behavioral health clinic and was instructed to call 911 for emergencies.   Subjective:   Interval History:  Patient seen alone.  Patient reports feeling okay today. Since the previous visit, she notes having issues with her son. She is planning on moving apartments and she is also having much physical symptoms. Coping strategies include deep breathing, watching happy self esteem video, and journaling.  Regarding psychiatric symptoms, she states mood is stable. She does note having more depression lately. She states she is not wanting to change medication at  this time.    Patient reports poor sleep, stating this is due to having chronic pain and stating she wakes up throughout the night. Patient reports fair appetite.   Patient denies current SI, HI, and AVH.   Substance use: denies tobacco, alcohol , and illicit substance    Visit Diagnosis:    ICD-10-CM   1. Attention deficit hyperactivity disorder (ADHD), unspecified ADHD type  F90.9     2. Generalized anxiety disorder  F41.1     3. Bipolar affective disorder, current episode mixed, current episode severity unspecified (HCC)  F31.60      Past Psychiatric History:  Previous admit 40 years ago is [place]  for severe depression.  Patient denies prior suicide attempts or self-harm.  Paxil  since 2023 Prozac  (did like it back then)  Family Psychiatric History: none pertinent  Social history: Living: alone Occupation: denies Relationship: single Children: 1 child, son who lives in [place]  Past Medical History:  Past Medical History:  Diagnosis Date   Allergy    Anemia    Anxiety    situational    Asthma    yeras ago - not current    Behcet's disease (HCC)    Bell's palsy    Cataract    both removed    Depression    situational -    Diplopia    Elevated blood pressure reading    no BP meds currently    Esotropia    Family history of adverse reaction to anesthesia  Fibromyalgia    GERD (gastroesophageal reflux disease)    Graves disease 1991   Heart murmur    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    Hip pain    HLA B27 (HLA B27 positive)    Hyperlipidemia    Hypertension    Knee pain    Memory changes    Morphea scleroderma    Neuromuscular disorder (HCC)    Raynauds    Osteoporosis    Pneumonia    Psoriatic arthritis (HCC)    seen in ears only   Raynaud's disease    Sjogren syndrome with dental involvement     Past Surgical History:  Procedure Laterality Date   CYST REMOVAL HAND     left arm  posterior   laproscopy     SALPINGECTOMY  1980   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   Procedure: LEFT TOTAL HIP ARTHROPLASTY ANTERIOR APPROACH;  Surgeon: Vernetta Lonni GRADE, MD;  Location: [facility];  Service: Orthopedics;  Laterality: Left;   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      Family History:  Family History  Problem Relation Age of Onset   Heart disease Mother    Heart attack Mother    Congestive Heart Failure Mother    Dementia Mother    Heart disease Father 63   Heart attack Father    Dementia Sister    Hemachromatosis Sister    Parkinson's disease Sister    Allergies Son    Colon cancer Neg Hx    Colon polyps Neg Hx    Esophageal cancer Neg Hx    Stomach cancer Neg Hx    Rectal cancer Neg Hx    BRCA 1/2 Neg Hx    Breast cancer Neg Hx     Social History:  Social History   Socioeconomic History   Marital status: Single    Spouse name: Not on file   Number of children: 1   Years of education: Not on file   Highest education level: Some college, no degree  Occupational History   Occupation: Unemployed  Tobacco Use   Smoking status: Former    Passive exposure: Past   Smokeless tobacco: Never  Vaping Use   Vaping status: Never Used  Substance and Sexual Activity   Alcohol  use: Not Currently   Drug use: Never   Sexual activity: Not Currently  Other Topics Concern   Not on file  Social History Narrative   Not on file   Social Drivers of Health   Financial Resource Strain: High Risk (07/15/2023)   Overall Financial Resource Strain (CARDIA)    Difficulty of Paying Living Expenses: Hard  Food Insecurity: No Food Insecurity (10/07/2023)   Hunger Vital Sign    Worried About Running Out of Food in the Last Year: Never true    Ran Out of Food in the Last Year: Never true  Recent Concern: Food Insecurity - Food Insecurity Present (10/03/2023)   Hunger Vital Sign    Worried About Running Out of Food in the Last Year: Sometimes true    Ran Out of Food in the Last  Year: Never true  Transportation Needs: Unmet Transportation Needs (10/07/2023)   PRAPARE - Transportation    Lack of Transportation (Medical): Yes    Lack of Transportation (Non-Medical): Yes  Physical Activity: Inactive (07/15/2023)   Exercise Vital Sign    Days of Exercise per Week: 0 days    Minutes of Exercise per Session:  Not on file  Stress: Stress Concern Present (07/15/2023)   [employer] of Occupational Health - Occupational Stress Questionnaire    Feeling of Stress: To some extent  Social Connections: Unknown (07/15/2023)   Social Connection and Isolation Panel    Frequency of Communication with Friends and Family: Once a week    Frequency of Social Gatherings with Friends and Family: Once a week    Attends Religious Services: Never    Database administrator or Organizations: No    Attends Engineer, structural: Not on file    Marital Status: Patient declined    Allergies:  Allergies  Allergen Reactions   Iodinated Contrast Media Anaphylaxis   Prohance [Gadoteridol] Anaphylaxis   Ioversol Other (See Comments)   Latex Hives and Rash   Ptu [Propylthiouracil] Other (See Comments)   Sulfa Antibiotics Hives and Other (See Comments)    Unknown reaction, family history   Tapazole [Methimazole] Other (See Comments)    Current Medications: Current Outpatient Medications  Medication Sig Dispense Refill   alendronate  (FOSAMAX ) 10 MG tablet Take 1 tablet (10 mg total) by mouth daily before breakfast. Take with a full glass of water on an empty stomach. 90 tablet 3   amphetamine -dextroamphetamine  (ADDERALL) 10 MG tablet Take 1 tablet (10 mg total) by mouth 2 (two) times daily with a meal. 60 tablet 0   amphetamine -dextroamphetamine  (ADDERALL) 10 MG tablet Take 1 tablet (10 mg total) by mouth 2 (two) times daily with a meal for 17 days. 34 tablet 0   atorvastatin  (LIPITOR) 40 MG tablet Take 1 tablet (40 mg total) by mouth daily. 90 tablet 3   busPIRone  (BUSPAR ) 10 MG  tablet Take 1 tablet (10 mg total) by mouth 3 (three) times daily. 90 tablet 2   Calcium  Carb-Cholecalciferol  (CALCIUM  1000 + D) 1000-20 MG-MCG TABS Take 1 tablet by mouth daily. 30 tablet 2   cetirizine  (ZYRTEC  ALLERGY) 10 MG tablet Take 1 tablet (10 mg total) by mouth daily. 90 tablet 1   clotrimazole  (LOTRIMIN ) 1 % cream Apply 1 Application topically 2 (two) times daily. To right underarm (Patient taking differently: Apply 1 Application topically 2 (two) times daily. To right underarm) 30 g 0   colchicine  0.6 MG tablet Take 1 tablet (0.6 mg total) by mouth daily. 90 tablet 3   cycloSPORINE  (RESTASIS ) 0.05 % ophthalmic emulsion Place 1 drop into both eyes 2 (two) times daily. 60 each 3   fluticasone  (FLONASE ) 50 MCG/ACT nasal spray USE 2 SPRAYS IN EACH NOSTRIL ONCE TIME DAILY 16 mL 6   levothyroxine  (SYNTHROID ) 175 MCG tablet Take 1 tablet (175 mcg total) by mouth daily before breakfast. 30 tablet 0   lidocaine  (XYLOCAINE ) 2 % solution Swish and spit 15mL no more than every 4 hours for oral ulcer pain. (Patient taking differently: Swish and spit 15mL no more than every 4 hours for oral ulcer pain.) 100 mL 3   lisinopril  (ZESTRIL ) 2.5 MG tablet Take 1 tablet (2.5 mg total) by mouth at bedtime. 90 tablet 3   methocarbamol  (ROBAXIN ) 500 MG tablet Take 1 tablet (500 mg total) by mouth every 8 (eight) hours as needed for muscle spasms. TAKE ONE TABLET BY MOUTH EVERY 6 HOURS AS NEEDED FOR MUSCLE SPASM 40 tablet 0   omeprazole  (PRILOSEC) 40 MG capsule Take 1 capsule (40 mg total) by mouth in the morning and at bedtime. 180 capsule 3   ondansetron  (ZOFRAN ) 4 MG tablet TAKE ONE TABLET BY MOUTH EVERY 8 HOURS  AS NEEDED FOR NAUSEA AND/OR VOMITING 20 tablet 0   PARoxetine  (PAXIL ) 40 MG tablet Take 1 tablet (40 mg total) by mouth daily. 90 tablet 1   QUEtiapine  (SEROQUEL ) 400 MG tablet Take 1 tablet (400 mg total) by mouth at bedtime. 90 tablet 1   rizatriptan  (MAXALT ) 10 MG tablet Take 1 tablet (10 mg total) by  mouth as needed for migraine. May repeat in 2 hours if needed. Do not take more than THREE doses in one day. Do not use more than 10 days per month. 30 tablet 2   traMADol  (ULTRAM ) 50 MG tablet Take 0.5 tablets (25 mg total) by mouth every 6 (six) hours as needed for up to 60 doses for severe pain (pain score 7-10). (Patient not taking: Reported on 10/07/2023) 30 tablet 0   valACYclovir  (VALTREX ) 500 MG tablet TAKE ONE TABLET BY MOUTH TWICE A DAY FOR 7 DAYS 30 tablet 2   verapamil  (CALAN -SR) 120 MG CR tablet Take 1 tablet (120 mg total) by mouth daily. (Patient not taking: Reported on 10/07/2023) 90 tablet 1   No current facility-administered medications for this visit.     Objective: Psychiatric Specialty Exam: General Appearance: appears at stated age, casually dressed and groomed   Behavior: pleasant and cooperative   Psychomotor Activity: no psychomotor agitation or retardation noted   Eye Contact: fair  Speech: normal amount, volume and fluency    Mood: euthymic  Affect: congruent, pleasant and interactive   Thought Process: linear, goal directed, no circumstantial or tangential thought process noted, no racing thoughts or flight of ideas  Descriptions of Associations: intact   Thought Content Hallucinations: denies AH, VH , does not appear responding to stimuli  Delusions: no paranoia, delusions of control, grandeur, ideas of reference, thought broadcasting, and magical thinking  Suicidal Thoughts: denies SI, intention, plan  Homicidal Thoughts: denies HI, intention, plan   Alertness/Orientation: alert and fully oriented   Insight: fair Judgment: fair  Memory: intact   Executive Functions  Concentration: intact  Attention Span: fair  Recall: intact  Fund of Knowledge: fair   Physical Exam General: Pleasant, well-appearing. No acute distress. Pulmonary: Normal effort. No wheezing or rales. Skin: No obvious rash or lesions. Neuro: A&Ox3.No focal deficit.  Review  of Systems  Joint pain   Metabolic Disorder Labs: Lab Results  Component Value Date   HGBA1C 5.3 08/10/2023   No results found for: PROLACTIN Lab Results  Component Value Date   CHOL 167 05/24/2023   TRIG 232 (H) 05/24/2023   HDL 48 05/24/2023   CHOLHDL 3.5 05/24/2023   LDLCALC 81 05/24/2023   LDLCALC 175 (H) 02/12/2020   Lab Results  Component Value Date   TSH 13.000 (H) 08/10/2023   TSH 3.390 05/24/2023    Therapeutic Level Labs: No results found for: LITHIUM No results found for: VALPROATE No results found for: CBMZ  Screenings: GAD-7    Flowsheet Row Video Visit from 08/26/2021 in [facility] Video Visit from 06/24/2021 in [facility] Video Visit from 04/22/2021 in [facility] Video Visit from 03/20/2021 in [facility] Video Visit from 02/19/2021 in [facility]  Total GAD-7 Score 9 10 14 13 11    PHQ2-9    Flowsheet Row Office Visit from 05/24/2023 in [facility] - A Dept Of [place]. [facility] Office Visit from 03/22/2023 in [facility] - A Dept  Of [place]. [facility] Office Visit from 08/10/2022 in [facility] - A Dept Of [place]. [facility] Office Visit from 06/29/2022 in [facility] - A Dept Of [facility]. [facility] Clinical Support from 03/09/2022 in [facility] - A Dept Of [place] [facility]. [facility]  PHQ-2 Total Score 4 5 2 6 2   PHQ-9 Total Score 12 22 15 19 15    Flowsheet Row UC from 12/11/2022 in [facility] Video Visit from 08/26/2021 in [facility] Video Visit from 06/24/2021 in [facility]  C-SSRS RISK CATEGORY No Risk Low Risk Low Risk    [name], MD PGY-3 Psychiatry Resident

## 2023-10-06 ENCOUNTER — Encounter (HOSPITAL_COMMUNITY): Admitting: Psychiatry

## 2023-10-07 ENCOUNTER — Other Ambulatory Visit: Payer: Self-pay

## 2023-10-07 ENCOUNTER — Other Ambulatory Visit: Payer: Self-pay | Admitting: *Deleted

## 2023-10-07 NOTE — Patient Outreach (Signed)
 Complex Care Management   Visit Note  10/07/2023  Name:  Jodi Gilmore MRN: [id number] DOB: [birth year]  Situation: Referral received for Complex Care Management related to HTN I obtained verbal consent from Patient.  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  Patient reports that she is working with Psychologist, educational to assist with housing and food needs.  Mrs. Asano informs me that she lives in an apartment and that the apartment will now be a hub apartment.  She is working with Child psychotherapist to find affordable housing.  She informs me her lease is over at the end of December.  Patient reports that she is unable to speak for more then 30 minutes due to her Behcet's disease.  I have scheduled a follow up appointment on 10/14/23 @ 11:30 am to complete review of systems.   Patient Reported Symptoms:  Cognitive Cognitive Status: Able to follow simple commands, Alert and oriented to person, place, and time, Insightful and able to interpret abstract concepts,  Normal speech and language skills Cognitive/Intellectual Conditions Management [RPT]: None reported or documented in medical history or problem list   Health Maintenance Behaviors: Annual physical exam, Sleep adequate, Stress management, Healthy diet Healing Pattern: Average Health Facilitated by: Healthy diet, Pain control, Prayer/meditation, Stress management, Rest  Neurological Neurological Review of Symptoms: Dizziness Neurological Comment: Reports dizzy due to medical conditions  HEENT HEENT Symptoms Reported: Other: HEENT Management Strategies: Adequate rest, Routine screening, Medication therapy, Coping strategies HEENT Comment: Patient has a PMH of Behcet's disease.  She often has genital sores or ulcers in her mouth.  She is prescribed medication for the Behcet's disease.    Cardiovascular   Cardiovascular Comment: unable to access patient is not able to talke long periods of time.  Respiratory Respiratory Symptoms Reported: Shortness of breath, Productive cough Other Respiratory Symptoms: Patient reports that she gets short of breath when talking.  She reports that Respiratory Management Strategies: Adequate rest, Routine screening  Endocrine   Endocrine Comment: unable to access patient is not able to talke long periods of time.  Gastrointestinal        Genitourinary   Genitourinary Comment: unable to access patient is not  able to talke long periods of time.  Integumentary Additional Integumentary Details: unable to access patient is not able to talke long periods of time. Skin Comment: unable to access patient is not able to talke long periods of time.  Musculoskeletal   Musculoskeletal Comment: unable to access patient is not able to talke long periods of time.      Psychosocial Additional Psychological Details: unable to access patient is not able to talke long periods of time.     Do you feel physically threatened by others?: No    10/07/2023    PHQ2-9 Depression  Screening   Little interest or pleasure in doing things    Feeling down, depressed, or hopeless    PHQ-2 - Total Score    Trouble falling or staying asleep, or sleeping too much    Feeling tired or having little energy    Poor appetite or overeating     Feeling bad about yourself - or that you are a failure or have let yourself or your family down    Trouble concentrating on things, such as reading the newspaper or watching television    Moving or speaking so slowly that other people could have noticed.  Or the opposite - being so fidgety or restless that you have been moving around a lot more than usual    Thoughts that you would be better off dead, or hurting yourself in some way    PHQ2-9 Total Score    If you checked off any problems, how difficult have these problems made it for you to do your work, take care of things at home, or get along with other people    Depression Interventions/Treatment      There were no vitals filed for this visit.  Medications Reviewed Today     Reviewed by Jorja Nichole LABOR, RN (Case Manager) on 10/07/23 at 1159  Med List Status: <None>   Medication Order Taking? Sig Documenting Provider Last Dose Status Informant  alendronate  (FOSAMAX ) 10 MG tablet [id number] Yes Take 1 tablet (10 mg total) by mouth daily before breakfast. Take with a full glass of water on an empty stomach. Gomes, Adriana, DO  Active   amphetamine -dextroamphetamine  (ADDERALL) 10 MG tablet [id number] Yes Take 1 tablet (10 mg total) by mouth 2 (two) times daily with a meal. Marry Clamp, MD  Active   amphetamine -dextroamphetamine  (ADDERALL) 10 MG tablet [id number] Yes Take 1 tablet (10 mg total) by mouth 2 (two) times daily with a meal for 17 days. Izella Ismael NOVAK, MD  Active   atorvastatin  (LIPITOR) 40 MG tablet [id number] Yes Take 1 tablet (40 mg total) by mouth daily. Gomes, Adriana, DO  Active   busPIRone  (BUSPAR ) 10 MG tablet [id number] Yes Take 1 tablet (10 mg total) by mouth 3  (three) times daily. Marry Clamp, MD  Active   Calcium  Carb-Cholecalciferol  (CALCIUM  1000 + D) 1000-20 MG-MCG TABS [id number] Yes Take 1 tablet by mouth daily. Tharon Lung, MD  Active   cetirizine  (ZYRTEC  ALLERGY) 10 MG tablet [id number] Yes Take 1 tablet (10 mg total) by mouth daily. Simmons-Robinson, Makiera, MD  Active Self  clotrimazole  (LOTRIMIN ) 1 % cream [id number] Yes Apply 1 Application topically 2 (two) times daily. To right underarm  Patient taking differently: Apply 1 Application topically 2 (two) times daily. To right underarm   Jennelle Riis, MD  Active   colchicine  0.6 MG tablet [id number] Yes Take 1 tablet (0.6 mg total) by mouth daily. Marlee Lynwood NOVAK, MD  Active   cycloSPORINE  (RESTASIS ) 0.05 % ophthalmic emulsion [id number] Yes Place 1 drop into both eyes 2 (two) times daily. Lafe Domino, DO  Active   fluticasone  (FLONASE ) 50 MCG/ACT nasal spray [id number] Yes USE 2 SPRAYS IN EACH NOSTRIL ONCE TIME DAILY Marlee Lynwood NOVAK, MD  Active   levothyroxine  (SYNTHROID ) 175 MCG tablet [id number] Yes Take 1 tablet (175 mcg total) by mouth daily before breakfast. Diona Perkins, MD  Active   lidocaine  (XYLOCAINE ) 2 % solution [id number] Yes Swish and spit 15mL no more than every 4 hours for oral ulcer pain.  Patient taking differently: Swish and spit 15mL no more than every 4 hours for oral ulcer pain.   Marlee Lynwood NOVAK, MD  Active   lisinopril  (ZESTRIL ) 2.5 MG tablet [id number] Yes Take 1 tablet (2.5 mg total) by mouth at bedtime. Lafe Domino, DO  Active   methocarbamol  (ROBAXIN ) 500 MG tablet [id number] Yes Take 1 tablet (500 mg total) by mouth every 8 (eight) hours as needed for muscle spasms. TAKE ONE TABLET BY MOUTH EVERY 6 HOURS AS NEEDED FOR MUSCLE SPASM Vernetta Lonni GRADE, MD  Active   omeprazole  (PRILOSEC) 40 MG capsule [id number] Yes Take 1 capsule (40 mg total) by mouth in the morning and at bedtime. Joshua Domino, DO  Active   ondansetron  (ZOFRAN ) 4 MG tablet [id number] Yes TAKE  ONE TABLET BY MOUTH EVERY 8 HOURS AS NEEDED FOR NAUSEA AND/OR VOMITING Diona Perkins, MD  Active   PARoxetine  (PAXIL ) 40 MG tablet [id number] Yes Take 1 tablet (40 mg total) by mouth daily. Marry Clamp, MD  Active   QUEtiapine  (SEROQUEL ) 400 MG tablet [id number] Yes Take 1 tablet (400 mg total) by mouth at bedtime. Marry Clamp, MD  Active   rizatriptan  (MAXALT ) 10 MG tablet [id number] Yes Take 1 tablet (10 mg total) by mouth as needed for migraine. May repeat in 2 hours if needed. Do not take more than THREE doses in one day. Do not use more than 10 days per month. Diona Perkins, MD  Active   traMADol  (ULTRAM ) 50 MG tablet [id number]  Take 0.5 tablets (25 mg total) by mouth every 6 (six) hours as needed for up to 60 doses for severe pain (pain score 7-10).  Patient not taking: Reported on 10/07/2023   Lafe Domino, DO  Active   valACYclovir  (VALTREX ) 500 MG tablet [id number] Yes TAKE ONE TABLET BY MOUTH TWICE A DAY FOR 7 DAYS Marlee Lynwood NOVAK, MD  Active   verapamil  (CALAN -SR) 120 MG CR tablet [id number]  Take 1 tablet (120 mg total) by mouth daily.  Patient not taking: Reported on 10/07/2023   Jennelle Riis, MD  Active             Recommendation:   PCP Follow-up Specialty provider follow-up [facility]-10/13/23; 11/21/23 -Rheumatology; PCP- 11/24/23 Continue Current Plan of Care  Follow Up Plan:   Telephone follow-up in 1 week: 10/14/23 @ 1130 am  Tegh Franek, RN, BSN, [employer] RN Care Manager [employer] [phone]

## 2023-10-07 NOTE — Patient Instructions (Signed)
 Visit Information  Thank you for taking time to visit with me today. Please don't hesitate to contact me if I can be of assistance to you before our next scheduled appointment.  Our next appointment is by telephone on 10/14/23  at 1130 am Please call the care guide team at [phone] if you need to cancel or reschedule your appointment.   Following is a copy of your care plan:   Goals Addressed             This Visit's Progress    VBCI RN Care Plan-HTN       Problems:  Care Coordination needs related to Food Insecurity , Housing , and Transportation Chronic Disease Management support and education needs related to HTN  Goal: Over the next 90 days the Patient will attend all scheduled medical appointments: PCP and Specialist as evidenced by keeping all scheduled appointments.         continue to work with Medical illustrator and/or Social Worker to address care management and care coordination needs related to HTN as evidenced by adherence to care management team scheduled appointments     take all medications exactly as prescribed and will call provider for medication related questions as evidenced by compliance.    verbalize basic understanding of HTN disease process and self health management plan as evidenced by verbal explanation, recognize and monitor symptoms and life style changes.   work with Child psychotherapist to [employer] barriers, Transportation, and food insecurity related to the management of HTN as evidenced by review of electronic medical record and patient or social worker report      Interventions:   Hypertension Interventions: Last practice recorded BP readings:  BP Readings from Last 3 Encounters:  08/10/23 (!) 140/86  07/05/23 (!) 138/90  06/28/23 114/60   Most recent eGFR/CrCl:  Lab Results  Component Value Date   EGFR 67 05/24/2023    No components found for: CRCL  Evaluation of current treatment plan related to hypertension self management and  patient's adherence to plan as established by provider Provided education to patient re: stroke prevention, s/s of heart attack and stroke Reviewed medications with patient and discussed importance of compliance Discussed plans with patient for ongoing care management follow up and provided patient with direct contact information for care management team Advised patient, providing education and rationale, to monitor blood pressure daily and record, calling PCP for findings outside established parameters Discussed complications of poorly controlled blood pressure such as heart disease, stroke, circulatory complications, vision complications, kidney impairment, sexual dysfunction Screening for signs and symptoms of depression related to chronic disease state  Assessed social determinant of health barriers  Patient Self-Care Activities:  Attend all scheduled provider appointments Call pharmacy for medication refills 3-7 days in advance of running out of medications Call provider office for new concerns or questions  Take medications as prescribed   Work with the social worker to address care coordination needs and will continue to work with the clinical team to address health care and disease management related needs choose a place to take my blood pressure (home, clinic or office, retail store) learn about high blood pressure call doctor for signs and symptoms of high blood pressure keep all doctor appointments take medications for blood pressure exactly as prescribed eat more whole grains, fruits and vegetables, lean meats and healthy fats  Plan:  Telephone follow up appointment with care management team member scheduled for:  10/14/2023  Please call the Suicide and Crisis Lifeline: 988 call the [place]  National Suicide Prevention Lifeline: (563)802-0691 or TTY: 951-132-1472 TTY 828 578 5210) to talk to a trained counselor call 1-800-273-TALK (toll free, 24 hour  hotline) go to [facility] [address], [place] [phone]) call 911 if you are experiencing a Mental Health or Behavioral Health Crisis or need someone to talk to.  Patient verbalizes understanding of instructions and care plan provided today and agrees to view in MyChart. Active MyChart status and patient understanding of how to access instructions and care plan via MyChart confirmed with patient.     Jodi Kleinschmidt, RN, BSN, Theatre manager [employer] [phone]

## 2023-10-11 ENCOUNTER — Other Ambulatory Visit: Payer: Self-pay | Admitting: *Deleted

## 2023-10-13 ENCOUNTER — Ambulatory Visit (INDEPENDENT_AMBULATORY_CARE_PROVIDER_SITE_OTHER): Admitting: Psychiatry

## 2023-10-13 VITALS — BP 136/87

## 2023-10-13 DIAGNOSIS — F909 Attention-deficit hyperactivity disorder, unspecified type: Secondary | ICD-10-CM | POA: Diagnosis not present

## 2023-10-13 DIAGNOSIS — F3160 Bipolar disorder, current episode mixed, unspecified: Secondary | ICD-10-CM | POA: Diagnosis not present

## 2023-10-13 DIAGNOSIS — F411 Generalized anxiety disorder: Secondary | ICD-10-CM

## 2023-10-13 MED ORDER — QUETIAPINE FUMARATE 400 MG PO TABS: 400.0000 mg | ORAL_TABLET | Freq: Every day | ORAL | 0 refills | Status: DC

## 2023-10-13 MED ORDER — PAROXETINE HCL 20 MG PO TABS: ORAL_TABLET | ORAL | 0 refills | Status: DC

## 2023-10-13 MED ORDER — AMPHETAMINE-DEXTROAMPHETAMINE 10 MG PO TABS: 10.0000 mg | ORAL_TABLET | Freq: Two times a day (BID) | ORAL | 0 refills | Status: DC

## 2023-10-13 MED ORDER — FLUOXETINE HCL 10 MG PO CAPS: 10.0000 mg | ORAL_CAPSULE | Freq: Every day | ORAL | 1 refills | Status: DC

## 2023-10-13 NOTE — Patient Instructions (Signed)
 Thank you for attending your appointment today.  -- decrease Paxil  to 30 mg daily for two weeks then decrease to 20 mg daily -- start Prozac  10 mg daily -- Continue other medications as prescribed.  Please do not make any changes to medications without first discussing with your provider. If you are experiencing a psychiatric emergency, please call 911 or present to your nearest emergency department. Additional crisis, medication management, and therapy resources are included below.  [facility]  [address] [place], [place] [address] [phone] WALK-IN URGENT CARE 24/7 FOR ANYONE [address] [place], [place]  [phone] Fax: [phone] [email or web address] *Interpreters available *Accepts all insurance and uninsured for Urgent Care needs *Accepts Medicaid and uninsured for outpatient treatment (below)      ONLY FOR [facility]  Below:    Outpatient New Patient Assessment/Therapy Walk-ins:        Monday, Wednesday, and Thursday 8am until slots are full (first come, first served)                   New Patient Psychiatry/Medication Management        Monday-Friday 8am-11am (first come, first served)               For all walk-ins we ask that you arrive by 7:15am, because patients will be seen in the order of arrival.

## 2023-10-14 ENCOUNTER — Other Ambulatory Visit: Payer: Self-pay | Admitting: *Deleted

## 2023-10-14 NOTE — Patient Outreach (Signed)
 Complex Care Management   Visit Note  10/14/2023  Name:  Jodi Gilmore MRN: [id number] DOB: April 21, 1955  Situation: Referral received for Complex Care Management related to SDOH Barriers:  Transportation Housing Insecurity Food insecurity and HTN I obtained verbal consent from Patient.  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  Review of Systems review completed today.   BSW if following patient for SDOH needs.   Patient Reported Symptoms:  Cognitive Cognitive Status: Able to follow simple commands, Alert and oriented to person, place, and time, Insightful and able to interpret abstract concepts, Normal speech and language skills Cognitive/Intellectual Conditions Management [RPT]: None reported or documented in medical history or problem list   Health Maintenance Behaviors: Annual physical exam, Sleep adequate, Stress management, Healthy diet Healing Pattern: Average Health Facilitated by: Healthy diet, Pain control, Prayer/meditation, Stress  management, Rest  Neurological Neurological Review of Symptoms: Dizziness Neurological Comment: Reports dizzy due to medical conditions  HEENT HEENT Symptoms Reported: Other: HEENT Management Strategies: Adequate rest, Routine screening, Medication therapy HEENT Comment: Patient has PMH of Behcet's disease.  She often has genital sores or ulcers in her mouth.  She is prescribed medicaiton for the Behcet's disease.    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  She reports that she has chest pain that comes and goes.  Respiratory Respiratory Symptoms Reported: Productive cough, Shortness of breath Other Respiratory Symptoms: Patient reports that she gets short of breath when talking.  She reports that she continues to shortness of breath.  Patient reports that providers are continuing medical workup.  Patient has had a chest-xray and ultrasound. Additional Respiratory Details: Reports that she had a chest x-ray that did not show a lot. Respiratory Management Strategies: Adequate rest, Routine screening Respiratory Self-Management Outcome: 3 (uncertain)  Endocrine Endocrine Symptoms Reported: No symptoms reported Is patient diabetic?: No Endocrine Self-Management Outcome: 4 (good)  Gastrointestinal Gastrointestinal Symptoms Reported: No symptoms reported Additional Gastrointestinal Details: Patient uses Miralax as needed. Gastrointestinal Management Strategies: Adequate rest    Genitourinary Genitourinary Symptoms Reported: Incontinence, Frequency Additional Genitourinary Details: Patient has  periods of incontinence.  Patient is were diapers to help with incontinence.  Patient has a history of bladder infections. Genitourinary Management Strategies: Adequate rest Genitourinary Comment: Lesions in private  areas due to Behcets disease.  Integumentary Integumentary Symptoms Reported: Other Other Integumentary Symptoms: Patient has lesions in her mouth and private area due to Behcets. Skin Management Strategies: Adequate rest, Medication therapy, Coping strategies  Musculoskeletal Musculoskelatal Symptoms Reviewed: Back pain, Difficulty walking        Psychosocial Psychosocial Symptoms Reported: Anxiety - if selected complete GAD Additional Psychological Details: Patient reports that she has Bipolar disorder. Behavioral Management Strategies: Counseling, Coping strategies, Adequate rest, Medication therapy Major Change/Loss/Stressor/Fears (CP): Medical condition, self Techniques to Cope with Loss/Stress/Change: Counseling Quality of Family Relationships: helpful, involved, supportive Do you feel physically threatened by others?: No    10/14/2023    PHQ2-9 Depression Screening   Little interest or pleasure in doing things Not at all  Feeling down, depressed, or hopeless Not at all  PHQ-2 - Total Score 0  Trouble falling or staying asleep, or sleeping too much    Feeling tired or having little energy    Poor appetite or overeating     Feeling bad about yourself - or that you are a failure or have let yourself or your family down    Trouble concentrating on things, such as reading the newspaper or watching television    Moving or speaking so slowly that other people could have noticed.  Or the opposite - being so fidgety or restless that you have been moving around a lot more than usual    Thoughts that you would be better off dead, or hurting yourself in some way    PHQ2-9 Total Score    If you checked off any problems, how difficult have these problems made it for you to do your work, take care of things at home, or get along with other people    Depression Interventions/Treatment      There were no vitals filed for this visit.  Medications Reviewed Today     Reviewed by Jorja Nichole LABOR, RN (Case Manager) on 10/14/23 at 1427  Med List Status: <None>   Medication Order Taking? Sig Documenting Provider Last Dose Status Informant  alendronate  (FOSAMAX ) 10 MG tablet [id number]  Take 1 tablet (10 mg total) by mouth daily before breakfast. Take with a full glass of water on an empty stomach. Janna Ferrier, DO  Active   amphetamine -dextroamphetamine  (ADDERALL) 10 MG tablet [id number]  Take 1 tablet (10 mg total) by mouth 2 (two) times daily with a meal for 17 days. Izella Ismael NOVAK, MD  Expired 10/13/23 2359   amphetamine -dextroamphetamine  (ADDERALL) 10 MG tablet [id number]  Take 1 tablet (10 mg total) by mouth 2 (two) times daily with a meal. Izella Ismael NOVAK, MD  Active   amphetamine -dextroamphetamine  (ADDERALL) 10 MG tablet [id number]  Take 1 tablet (10 mg total) by mouth 2 (two) times daily with a meal. Izella Ismael NOVAK, MD  Active   atorvastatin  (LIPITOR) 40 MG tablet [id number]  Take 1 tablet (40 mg total) by mouth daily. Janna Ferrier, DO  Active   busPIRone  (BUSPAR ) 10 MG tablet [id number]  Take 1 tablet (10 mg total) by mouth 3 (three) times daily. Marry Clamp, MD  Active   Calcium  Carb-Cholecalciferol  (CALCIUM  1000 + D) 1000-20 MG-MCG TABS [id number]  Take 1 tablet by mouth daily. Tharon Lung, MD  Active   cetirizine  (ZYRTEC  ALLERGY) 10 MG tablet [id number]  Take 1 tablet (10 mg total) by mouth daily. Simmons-Robinson, Makiera, MD  Active Self  clotrimazole  (LOTRIMIN ) 1 % cream [id number]  Apply 1 Application topically 2 (two) times daily. To right underarm  Patient taking differently: Apply 1 Application topically 2 (two) times daily. To right underarm   Jennelle Riis, MD  Active   colchicine  0.6 MG tablet [id number]  Take 1 tablet (0.6 mg total) by mouth daily. Marlee Lynwood NOVAK, MD  Active   cycloSPORINE  (RESTASIS ) 0.05 % ophthalmic emulsion [id number]  Place 1 drop into both eyes 2 (two) times daily. Lafe Domino, DO  Active   FLUoxetine  (PROZAC ) 10 MG capsule [id number]   Take 1 capsule (10 mg total) by mouth daily. Hoang, Daniela B, MD  Active   fluticasone  (FLONASE ) 50 MCG/ACT nasal spray [id number]  USE 2 SPRAYS IN EACH NOSTRIL ONCE TIME DAILY Marlee Lynwood NOVAK, MD  Active   levothyroxine  (SYNTHROID ) 175 MCG tablet [id number]  Take 1 tablet (175 mcg total) by mouth daily before breakfast. Diona Perkins, MD  Active   lidocaine  (XYLOCAINE ) 2 % solution [id number]  Swish and spit 15mL no more than every 4 hours for oral ulcer pain.  Patient taking differently: Swish and spit 15mL no more than every 4 hours for oral ulcer pain.   Marlee Lynwood NOVAK, MD  Active   lisinopril  (ZESTRIL ) 2.5 MG tablet [id number]  Take 1 tablet (2.5 mg total) by mouth at bedtime. Lafe Domino, DO  Active   methocarbamol  (ROBAXIN ) 500 MG tablet [id number]  Take 1 tablet (500 mg total) by mouth every 8 (eight) hours as needed for muscle spasms. TAKE ONE TABLET BY MOUTH EVERY 6 HOURS AS NEEDED FOR MUSCLE SPASM Vernetta Lonni GRADE, MD  Active   omeprazole  (PRILOSEC) 40 MG capsule [id number]  Take 1 capsule (40 mg total) by mouth in the morning and at bedtime. Joshua Domino, DO  Active   ondansetron  (ZOFRAN ) 4 MG tablet [id number]  TAKE ONE TABLET BY MOUTH EVERY 8 HOURS AS NEEDED FOR NAUSEA AND/OR VOMITING Diona Perkins, MD  Active   PARoxetine  (PAXIL ) 20 MG tablet [id number]  Take 1.5 tablets (30 mg total) by mouth daily for 14 days, THEN 1 tablet (20 mg total) daily. Take 1.5 tablets for 2 weeks and then afterwards 1 tablet. Izella Ismael NOVAK, MD  Active   QUEtiapine  (SEROQUEL ) 400 MG tablet [id number]  Take 1 tablet (400 mg total) by mouth at bedtime. Izella Ismael NOVAK, MD  Active   rizatriptan  (MAXALT ) 10 MG tablet [id number]  Take 1 tablet (10 mg total) by mouth as needed for migraine. May repeat in 2 hours if needed. Do not take more than THREE doses in one day. Do not use more than 10 days per month. Diona Perkins, MD  Active   traMADol  (ULTRAM ) 50 MG tablet [id number]  Take 0.5 tablets (25 mg total) by mouth  every 6 (six) hours as needed for up to 60 doses for severe pain (pain score 7-10).  Patient not taking: Reported on 10/07/2023   Lafe Domino, DO  Active   valACYclovir  (VALTREX ) 500 MG tablet [id number]  TAKE ONE TABLET BY MOUTH TWICE A DAY FOR 7 DAYS Marlee Lynwood NOVAK, MD  Active   verapamil  (CALAN -SR) 120 MG CR tablet [id number]  Take 1 tablet (120 mg total) by mouth daily.  Patient not taking: Reported on 10/07/2023   Jennelle Riis, MD  Active             Recommendation:   PCP Follow-up  Continue Current Plan of Care  Follow Up Plan:   Telephone follow-up in 1 month: 11/17/23 @ 11 am  Any Mcneice, RN, Scientist, research (physical sciences), Theatre manager [employer] [phone]

## 2023-10-14 NOTE — Patient Instructions (Signed)
 Jodi Gilmore - I am sorry I was unable to reach you today for our scheduled appointment. I work with Lonnie, Mahnoor, MD and am calling to support your healthcare needs. Please contact me at Visit Information  Thank you for taking time to visit with me today. Please don't hesitate to contact me if I can be of assistance to you before our next scheduled appointment.  Your next care management appointment is by telephone on 11/17/23 at 11 am.  Please call the care guide team at [phone] if you need to cancel, schedule, or reschedule an appointment.   Please call the Suicide and Crisis Lifeline: 988 call the [place]  National Suicide Prevention Lifeline: 519-399-0565 or TTY: 706-024-0030 TTY [phone]) to talk to a trained counselor call 1-800-273-TALK (toll free, 24 hour hotline) go to [facility] [address], [place] [phone]) call 911 if you are experiencing a Mental Health or Behavioral Health Crisis or need someone to talk to.  Posey Petrik, RN, BSN, Theatre manager [employer] [phone]

## 2023-10-17 ENCOUNTER — Encounter: Payer: Self-pay | Admitting: Family Medicine

## 2023-10-17 ENCOUNTER — Encounter (HOSPITAL_COMMUNITY): Payer: Self-pay

## 2023-10-17 ENCOUNTER — Encounter: Payer: Self-pay | Admitting: Cardiology

## 2023-10-18 ENCOUNTER — Other Ambulatory Visit: Payer: Self-pay

## 2023-10-18 NOTE — Patient Outreach (Signed)
 Complex Care Management   Visit Note  10/18/2023  Name:  Jodi Gilmore MRN: [id number] DOB: 24-Jan-1955  Situation: Referral received for Complex Care Management related to SDOH Barriers:  Transportation Housing lease ends December 2025. Applying for SSA benefits (social security). I obtained verbal consent from Patient.  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 Pt was alert and cognitive. Pt confirmed she received BSW resources via email. Pt also reports need for application assistance/support with SSA SS benefit (retirement). Pt states she has tried to apply online but is unable to do so. BSW called SSA # and placed pt on a call-back queue. Pt was also provided via email other possible resources that might be able to provide application support for SS benefit. Lastly, pt was provided [employer] tool to aid in housing search. BSW also provide contact information to [facility] and encouraged pt to call to inquire about their affordable units.  No other resources were provided/requested at this time.   SDOH Interventions    Flowsheet Row Patient Outreach Telephone from 10/14/2023 in [place] POPULATION HEALTH DEPARTMENT Patient Outreach Telephone from 10/07/2023 in [place] POPULATION HEALTH DEPARTMENT Patient Outreach Telephone from 10/03/2023 in [place] POPULATION HEALTH DEPARTMENT Telephone from 04/08/2023 in [place] POPULATION HEALTH DEPARTMENT Telephone from 03/10/2022 in [facility] Community Care Coordination Clinical Support from 03/09/2022 in [facility] [place]  SDOH Interventions        Food Insecurity Interventions Intervention Not Indicated Intervention Not Indicated Community Resources Provided -- -- Intervention Not Indicated  Housing Interventions Other (Comment)  [Reports that her lease is up in December and needs assistance with getting another home.  BSW working with patient for resources.] Other (Comment)  [Patient reports that her lease will be up in December.  She reports that she has to leave the unit she is in because it is becoming a hub unit.] Intervention Not Indicated -- -- Intervention Not Indicated  Transportation Interventions Other (Comment)  [BSW working with patient for resources.] Other (Comment)  [BSW is assisting patient with resources.] Patient Resources (Friends/Family)  [relies on home care  aid to provide transportation.] [id number] Referral, Community Resources Provided Other (Comment)  [Mailing application for [employer] transportation.] [id number] Referral  Utilities Interventions Intervention Not Indicated Intervention Not Indicated Intervention Not Indicated Community Resources Provided, [id number] Referral -- Intervention Not Indicated  Alcohol  Usage Interventions -- -- -- -- -- Intervention Not Indicated (Score <7)  Depression Interventions/Treatment  -- -- -- -- -- Currently on Treatment  Financial Strain Interventions -- -- --  Programmer, applications Provided, [id number] Referral -- Intervention Not Indicated  Physical Activity Interventions -- -- -- -- -- Intervention Not Indicated  Stress Interventions -- -- -- -- -- Intervention Not Indicated  Social Connections Interventions -- -- -- -- -- Intervention Not Indicated      Recommendation:   Review resources and follow up with legal aid of [place] re SS benefit application assistance  Follow Up Plan:   Telephone follow up appointment date/time:  10/28/2023 at 2pm   Laymon Doll, [place]/VBCI - [facility] Social Worker [phone]

## 2023-10-18 NOTE — Patient Instructions (Signed)
 Visit Information  Thank you for taking time to visit with me today. Please don't hesitate to contact me if I can be of assistance to you before our next scheduled appointment.  Your next care management appointment is by telephone on 10/28/2023 at 2pm  Telephone follow up appointment date/time:  10/28/2023 at 2pm  Please call the care guide team at [phone] if you need to cancel, schedule, or reschedule an appointment.   Please call the Suicide and Crisis Lifeline: 988 go to [facility] [address], [place] [phone]) call 911 if you are experiencing a Mental Health or Behavioral Health Crisis or need someone to talk to.  Laymon Doll, BSW [place]/VBCI - [employer] Social Worker [phone]

## 2023-10-19 ENCOUNTER — Ambulatory Visit (HOSPITAL_COMMUNITY)
Admission: RE | Admit: 2023-10-19 | Discharge: 2023-10-19 | Disposition: A | Discharge status: Home / Self Care | Source: Ambulatory Visit | Attending: Cardiology | Admitting: Cardiology

## 2023-10-19 DIAGNOSIS — I10 Essential (primary) hypertension: Secondary | ICD-10-CM | POA: Insufficient documentation

## 2023-10-19 DIAGNOSIS — R079 Chest pain, unspecified: Secondary | ICD-10-CM | POA: Insufficient documentation

## 2023-10-19 LAB — NM PET CT CARDIAC PERFUSION MULTI W/ABSOLUTE BLOODFLOW
MBFR: 1.82
Nuc Rest EF: 71 %
Nuc Stress EF: 72 %
Rest MBF: 1.47 ml/g/min
Rest Nuclear Isotope Dose: 24.4 mCi
ST Depression (mm): 0 mm
Stress MBF: 2.67 ml/g/min
Stress Nuclear Isotope Dose: 24.1 mCi
TID: 1.03

## 2023-10-19 MED ORDER — REGADENOSON 0.4 MG/5ML IV SOLN
0.4000 mg | Freq: Once | INTRAVENOUS | Status: AC
Start: 2023-10-19 — End: 2023-10-19
  Administered 2023-10-19: 0.4 mg via INTRAVENOUS

## 2023-10-19 MED ORDER — RUBIDIUM RB82 GENERATOR (RUBYFILL)
25.0000 | PACK | Freq: Once | INTRAVENOUS | Status: AC
  Administered 2023-10-19: 24.43 via INTRAVENOUS

## 2023-10-19 MED ORDER — RUBIDIUM RB82 GENERATOR (RUBYFILL)
25.0000 | PACK | Freq: Once | INTRAVENOUS | Status: AC
  Administered 2023-10-19: 24.08 via INTRAVENOUS

## 2023-10-19 MED ORDER — REGADENOSON 0.4 MG/5ML IV SOLN
INTRAVENOUS | Status: AC
  Filled 2023-10-19: qty 5

## 2023-10-19 NOTE — Telephone Encounter (Signed)
 US  report is uploaded under media - dated 10/14/23

## 2023-10-20 ENCOUNTER — Ambulatory Visit: Payer: Self-pay | Admitting: Cardiology

## 2023-10-20 ENCOUNTER — Encounter: Payer: Self-pay | Admitting: Cardiology

## 2023-10-24 ENCOUNTER — Telehealth (HOSPITAL_COMMUNITY): Payer: Self-pay

## 2023-10-24 ENCOUNTER — Other Ambulatory Visit (HOSPITAL_COMMUNITY): Payer: Self-pay | Admitting: Psychiatry

## 2023-10-24 ENCOUNTER — Encounter: Payer: Self-pay | Admitting: Family Medicine

## 2023-10-24 DIAGNOSIS — F411 Generalized anxiety disorder: Secondary | ICD-10-CM

## 2023-10-24 MED ORDER — BUSPIRONE HCL 10 MG PO TABS
10.0000 mg | ORAL_TABLET | Freq: Three times a day (TID) | ORAL | 0 refills | Status: DC
Start: 2023-10-24 — End: 2023-11-03

## 2023-10-24 NOTE — Progress Notes (Signed)
 BH MD Outpatient Progress Note  Televisit via video: I connected with Jodi Gilmore on 10/23 at  1:30 PM EDT by a video enabled telemedicine application and verified that I am speaking with the correct person using two identifiers.  Location: Patient: home Provider: office   I discussed the limitations of evaluation and management by telemedicine and the availability of in person appointments. The patient expressed understanding and agreed to proceed.  I discussed the assessment and treatment plan with the patient. The patient was provided an opportunity to ask questions and all were answered. The patient agreed with the plan and demonstrated an understanding of the instructions.   The patient was advised to call back or seek an in-person evaluation if the symptoms worsen or if the condition fails to improve as anticipated.   Jodi Gilmore  MRN:  [id number]  Assessment:  Jodi Gilmore presents for follow-up evaluation. In the prior visit, we started cross titrating Paxil  to Prozac . In future visits, plan can be to decrease Seroquel  so long as her mood continues to be stable as his medication can also be causing weight gain and affecting thyroid  levels. Patient appears do be doing well today, tolerating the cross titration of Paxil  to Prozac . Much of anxiety and neurovegetative symptoms is attributed to her chronic pain as well. Will continue with the cross titration and her other medications at this time. She also agrees to obtain a UDS with her PCP for the monitoring of Adderall. F/u in 6 weeks.   Identifying Information: Jodi Gilmore is a 68 y.o. y.o. female with a history of bipolar disorder, GAD, and ADHD who is an established patient with [facility] for management of anxiety and depression.   Plan:  # Bipolar disorder, GAD -- Continue Paxil  20 mg daily for one week -> 10 mg daily for 2 weeks -> stop (was on 40 mg then was decreased to 30 mg for 2 weeks) --  Increase Prozac  to 20 mg daily - Continue buspirone  10 mg 3 times daily -- Continue Seroquel  400 mg at bedtime  -Labs updated on 07/2023  # ADHD -- Continue Adderall 10 mg BID  - PDMP checked  - Plan to obtain UDS from PCP  Patient was given contact information for behavioral health clinic and was instructed to call 911 for emergencies.   Subjective:   Interval History:  Patient seen alone.  Patient reports feeling good today. Since the previous visit, she notes going through a bladder infection and trying to figure out where she is going to live. She is feeling slightly anxious regarding where she is going to live. She states watching happy videos or deep breathing at night for her coping strategies. She states not having anxiety that makes her heart hurt anymore. She does note consistent chronic physical pain. Pt states having some anxiety but other feels she is tolerating the cross titration of Paxil  to Prozac .  Patient reports poor sleep, continuing to attribute this to her chronic pain.  Patient denies current SI, HI, and AVH.   Substance use:  Denies tobacco, alcohol , and illicit substance   Past Psychiatric History:  Previous admit 40 years ago is [place]  for severe depression.  Patient denies prior suicide attempts or self-harm.  Paxil  since 2023 Prozac  (did like it back then)  Family Psychiatric History: none pertinent  Social history: Living: alone Occupation: denies Relationship: single Children: 1 child, son who lives in [place]  Past Medical History:  Past Medical History:  Diagnosis Date   Allergy    Anemia    Anxiety    situational    Asthma    yeras ago - not current    Behcet's disease (HCC)    Bell's palsy    Cataract    both removed    Depression    situational -    Diplopia    Elevated blood pressure reading    no BP meds currently    Esotropia    Family history of adverse reaction to anesthesia    Fibromyalgia    GERD  (gastroesophageal reflux disease)    Graves disease 1991   Heart murmur    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    Hip pain    HLA B27 (HLA B27 positive)    Hyperlipidemia    Hypertension    Knee pain    Memory changes    Morphea scleroderma    Neuromuscular disorder (HCC)    Raynauds    Osteoporosis    Pneumonia    Psoriatic arthritis (HCC)    seen in ears only   Raynaud's disease    Sjogren syndrome with dental involvement     Past Surgical History:  Procedure Laterality Date   CYST REMOVAL HAND     left arm posterior   laproscopy     SALPINGECTOMY  1980   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   Procedure: LEFT TOTAL HIP ARTHROPLASTY ANTERIOR APPROACH;  Surgeon: Vernetta Lonni GRADE, MD;  Location: [facility];  Service: Orthopedics;  Laterality: Left;   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      Family History:  Family History  Problem Relation Age of Onset   Heart disease Mother    Heart attack Mother    Congestive Heart Failure Mother    Dementia Mother    Heart disease Father 22   Heart attack Father    Dementia Sister    Hemachromatosis Sister    Parkinson's disease Sister    Allergies Son    Colon cancer Neg Hx    Colon polyps Neg Hx    Esophageal cancer Neg Hx    Stomach cancer Neg Hx    Rectal cancer Neg Hx    BRCA 1/2 Neg Hx    Breast cancer Neg Hx     Social History:  Social History   Socioeconomic History   Marital status: Single    Spouse name: Not on file   Number of children: 1   Years of education: Not on file   Highest education level: Some college, no degree  Occupational History   Occupation: Unemployed  Tobacco Use   Smoking status: Former    Passive exposure: Past   Smokeless tobacco: Never  Vaping Use   Vaping status: Never Used  Substance and Sexual Activity   Alcohol  use: Not Currently   Drug use: Never   Sexual activity: Not Currently  Other Topics Concern   Not on file  Social History  Narrative   Not on file   Social Drivers of Health   Financial Resource Strain: High Risk (07/15/2023)   Overall Financial Resource Strain (CARDIA)    Difficulty of Paying Living Expenses: Hard  Food Insecurity: No Food Insecurity (10/14/2023)   Hunger Vital Sign    Worried About Running Out of Food in the Last Year: Never true    Ran Out of Food in the Last Year: Never true  Recent Concern: Food Insecurity -  Food Insecurity Present (10/03/2023)   Hunger Vital Sign    Worried About Running Out of Food in the Last Year: Sometimes true    Ran Out of Food in the Last Year: Never true  Transportation Needs: Unmet Transportation Needs (10/14/2023)   PRAPARE - Administrator, Civil Service (Medical): Yes    Lack of Transportation (Non-Medical): Yes  Physical Activity: Inactive (07/15/2023)   Exercise Vital Sign    Days of Exercise per Week: 0 days    Minutes of Exercise per Session: Not on file  Stress: Stress Concern Present (07/15/2023)   [employer] of Occupational Health - Occupational Stress Questionnaire    Feeling of Stress: To some extent  Social Connections: Unknown (07/15/2023)   Social Connection and Isolation Panel    Frequency of Communication with Friends and Family: Once a week    Frequency of Social Gatherings with Friends and Family: Once a week    Attends Religious Services: Never    Database administrator or Organizations: No    Attends Engineer, structural: Not on file    Marital Status: Patient declined    Allergies:  Allergies  Allergen Reactions   Iodinated Contrast Media Anaphylaxis   Prohance [Gadoteridol] Anaphylaxis   Ioversol Other (See Comments)   Latex Hives and Rash   Ptu [Propylthiouracil] Other (See Comments)   Sulfa Antibiotics Hives and Other (See Comments)    Unknown reaction, family history   Tapazole [Methimazole] Other (See Comments)    Current Medications: Current Outpatient Medications  Medication Sig Dispense  Refill   alendronate  (FOSAMAX ) 10 MG tablet Take 1 tablet (10 mg total) by mouth daily before breakfast. Take with a full glass of water on an empty stomach. 90 tablet 3   amphetamine -dextroamphetamine  (ADDERALL) 10 MG tablet Take 1 tablet (10 mg total) by mouth 2 (two) times daily with a meal for 17 days. 34 tablet 0   amphetamine -dextroamphetamine  (ADDERALL) 10 MG tablet Take 1 tablet (10 mg total) by mouth 2 (two) times daily with a meal. 60 tablet 0   [START ON 11/13/2023] amphetamine -dextroamphetamine  (ADDERALL) 10 MG tablet Take 1 tablet (10 mg total) by mouth 2 (two) times daily with a meal. 60 tablet 0   atorvastatin  (LIPITOR) 40 MG tablet Take 1 tablet (40 mg total) by mouth daily. 90 tablet 3   busPIRone  (BUSPAR ) 10 MG tablet Take 1 tablet (10 mg total) by mouth 3 (three) times daily. 90 tablet 2   Calcium  Carb-Cholecalciferol  (CALCIUM  1000 + D) 1000-20 MG-MCG TABS Take 1 tablet by mouth daily. 30 tablet 2   cetirizine  (ZYRTEC  ALLERGY) 10 MG tablet Take 1 tablet (10 mg total) by mouth daily. 90 tablet 1   clotrimazole  (LOTRIMIN ) 1 % cream Apply 1 Application topically 2 (two) times daily. To right underarm (Patient taking differently: Apply 1 Application topically 2 (two) times daily. To right underarm) 30 g 0   colchicine  0.6 MG tablet Take 1 tablet (0.6 mg total) by mouth daily. 90 tablet 3   cycloSPORINE  (RESTASIS ) 0.05 % ophthalmic emulsion Place 1 drop into both eyes 2 (two) times daily. 60 each 3   FLUoxetine  (PROZAC ) 10 MG capsule Take 1 capsule (10 mg total) by mouth daily. 30 capsule 1   fluticasone  (FLONASE ) 50 MCG/ACT nasal spray USE 2 SPRAYS IN EACH NOSTRIL ONCE TIME DAILY 16 mL 6   levothyroxine  (SYNTHROID ) 175 MCG tablet Take 1 tablet (175 mcg total) by mouth daily before breakfast. 30 tablet 0  lidocaine  (XYLOCAINE ) 2 % solution Swish and spit 15mL no more than every 4 hours for oral ulcer pain. (Patient taking differently: Swish and spit 15mL no more than every 4 hours for  oral ulcer pain.) 100 mL 3   lisinopril  (ZESTRIL ) 2.5 MG tablet Take 1 tablet (2.5 mg total) by mouth at bedtime. 90 tablet 3   methocarbamol  (ROBAXIN ) 500 MG tablet Take 1 tablet (500 mg total) by mouth every 8 (eight) hours as needed for muscle spasms. TAKE ONE TABLET BY MOUTH EVERY 6 HOURS AS NEEDED FOR MUSCLE SPASM 40 tablet 0   omeprazole  (PRILOSEC) 40 MG capsule Take 1 capsule (40 mg total) by mouth in the morning and at bedtime. 180 capsule 3   ondansetron  (ZOFRAN ) 4 MG tablet TAKE ONE TABLET BY MOUTH EVERY 8 HOURS AS NEEDED FOR NAUSEA AND/OR VOMITING 20 tablet 0   PARoxetine  (PAXIL ) 20 MG tablet Take 1.5 tablets (30 mg total) by mouth daily for 14 days, THEN 1 tablet (20 mg total) daily. Take 1.5 tablets for 2 weeks and then afterwards 1 tablet. 51 tablet 0   QUEtiapine  (SEROQUEL ) 400 MG tablet Take 1 tablet (400 mg total) by mouth at bedtime. 60 tablet 0   rizatriptan  (MAXALT ) 10 MG tablet Take 1 tablet (10 mg total) by mouth as needed for migraine. May repeat in 2 hours if needed. Do not take more than THREE doses in one day. Do not use more than 10 days per month. 30 tablet 2   traMADol  (ULTRAM ) 50 MG tablet Take 0.5 tablets (25 mg total) by mouth every 6 (six) hours as needed for up to 60 doses for severe pain (pain score 7-10). (Patient not taking: Reported on 10/07/2023) 30 tablet 0   valACYclovir  (VALTREX ) 500 MG tablet TAKE ONE TABLET BY MOUTH TWICE A DAY FOR 7 DAYS 30 tablet 2   verapamil  (CALAN -SR) 120 MG CR tablet Take 1 tablet (120 mg total) by mouth daily. (Patient not taking: Reported on 10/07/2023) 90 tablet 1   No current facility-administered medications for this visit.     Objective: Psychiatric Specialty Exam: General Appearance: appears at stated age, casually dressed and groomed   Behavior: pleasant and cooperative   Psychomotor Activity: no psychomotor agitation or retardation noted   Eye Contact: fair  Speech: normal amount, volume and fluency    Mood:  euthymic  Affect: congruent, pleasant and interactive   Thought Process: linear, goal directed, no circumstantial or tangential thought process noted, no racing thoughts or flight of ideas  Descriptions of Associations: intact   Thought Content Hallucinations: denies AH, VH , does not appear responding to stimuli  Delusions: no paranoia, delusions of control, grandeur, ideas of reference, thought broadcasting, and magical thinking  Suicidal Thoughts: denies SI, intention, plan  Homicidal Thoughts: denies HI, intention, plan   Alertness/Orientation: alert and fully oriented   Insight: fair Judgment: fair  Memory: intact   Executive Functions  Concentration: intact  Attention Span: fair  Recall: intact  Fund of Knowledge: fair   Physical Exam  General: Pleasant, well-appearing. No acute distress. Pulmonary: Normal effort. No wheezing or rales. Neuro: A&Ox3.No focal deficit.  Review of Systems  Positive myalgias  Metabolic Disorder Labs: Lab Results  Component Value Date   HGBA1C 5.3 08/10/2023   No results found for: PROLACTIN Lab Results  Component Value Date   CHOL 167 05/24/2023   TRIG 232 (H) 05/24/2023   HDL 48 05/24/2023   CHOLHDL 3.5 05/24/2023   LDLCALC 81 05/24/2023  LDLCALC 175 (H) 02/12/2020   Lab Results  Component Value Date   TSH 13.000 (H) 08/10/2023   TSH 3.390 05/24/2023    Therapeutic Level Labs: No results found for: LITHIUM No results found for: VALPROATE No results found for: CBMZ  Screenings: GAD-7    Flowsheet Row Patient Outreach Telephone from 10/14/2023 in [place] [facility] Video Visit from 08/26/2021 in [facility] Video Visit from 06/24/2021 in [facility] Video Visit from 04/22/2021 in [facility] Video Visit from 03/20/2021 in [facility]  Total GAD-7 Score 6 9 10 14 13    PHQ2-9     Flowsheet Row Patient Outreach Telephone from 10/14/2023 in [place] [facility] Office Visit from 05/24/2023 in [facility] - A Dept Of [place]. [facility] Office Visit from 03/22/2023 in [facility] - A Dept Of [place] [facility]. [facility] Office Visit from 08/10/2022 in [facility] - A Dept Of [place]. [facility] Office Visit from 06/29/2022 in [facility] - A Dept Of [place] [facility]. [facility]  PHQ-2 Total Score 0 4 5 2 6   PHQ-9 Total Score -- 12 22 15 19    Flowsheet Row UC from 12/11/2022 in [facility]) Video Visit from 08/26/2021 in [facility] Video Visit from 06/24/2021 in [facility]  C-SSRS RISK CATEGORY No Risk Low Risk Low Risk    [name], MD PGY-3 Psychiatry Resident

## 2023-10-24 NOTE — Progress Notes (Signed)
 Received refill request from pharmacy for this patient's BuSpar . I sent in the medication to bridge patient for the next appointment.  [name], MD PGY-3 Psychiatry Resident

## 2023-10-24 NOTE — Telephone Encounter (Signed)
 Patient's pharmacy requested refill of Buspirone  10 mg.

## 2023-10-25 ENCOUNTER — Other Ambulatory Visit: Payer: Self-pay

## 2023-10-25 DIAGNOSIS — R0609 Other forms of dyspnea: Secondary | ICD-10-CM

## 2023-10-28 ENCOUNTER — Telehealth: Payer: Self-pay

## 2023-10-31 ENCOUNTER — Ambulatory Visit: Admitting: Family Medicine

## 2023-11-01 ENCOUNTER — Other Ambulatory Visit (HOSPITAL_COMMUNITY): Payer: Self-pay | Admitting: Psychiatry

## 2023-11-01 ENCOUNTER — Encounter: Payer: Self-pay | Admitting: Family Medicine

## 2023-11-01 ENCOUNTER — Ambulatory Visit: Admitting: Family Medicine

## 2023-11-01 ENCOUNTER — Ambulatory Visit (HOSPITAL_COMMUNITY)
Admission: RE | Admit: 2023-11-01 | Discharge: 2023-11-01 | Disposition: A | Discharge status: Home / Self Care | Source: Ambulatory Visit | Attending: Family Medicine | Admitting: Family Medicine

## 2023-11-01 ENCOUNTER — Telehealth: Payer: Self-pay | Admitting: Family Medicine

## 2023-11-01 ENCOUNTER — Other Ambulatory Visit: Payer: Self-pay

## 2023-11-01 ENCOUNTER — Ambulatory Visit (INDEPENDENT_AMBULATORY_CARE_PROVIDER_SITE_OTHER): Admitting: Family Medicine

## 2023-11-01 ENCOUNTER — Telehealth: Payer: Self-pay

## 2023-11-01 VITALS — BP 118/82 | HR 126 | Ht 64.0 in | Wt 205.8 lb

## 2023-11-01 VITALS — BP 134/78 | HR 103 | Wt 205.8 lb

## 2023-11-01 DIAGNOSIS — R000 Tachycardia, unspecified: Secondary | ICD-10-CM | POA: Diagnosis present

## 2023-11-01 DIAGNOSIS — R400 Somnolence: Secondary | ICD-10-CM | POA: Diagnosis not present

## 2023-11-01 DIAGNOSIS — Z5181 Encounter for therapeutic drug level monitoring: Secondary | ICD-10-CM

## 2023-11-01 DIAGNOSIS — E039 Hypothyroidism, unspecified: Secondary | ICD-10-CM

## 2023-11-01 DIAGNOSIS — N3000 Acute cystitis without hematuria: Secondary | ICD-10-CM

## 2023-11-01 DIAGNOSIS — R350 Frequency of micturition: Secondary | ICD-10-CM

## 2023-11-01 DIAGNOSIS — G8929 Other chronic pain: Secondary | ICD-10-CM

## 2023-11-01 DIAGNOSIS — G43109 Migraine with aura, not intractable, without status migrainosus: Secondary | ICD-10-CM

## 2023-11-01 LAB — POCT URINALYSIS DIP (MANUAL ENTRY)
Blood, UA: NEGATIVE
Glucose, UA: NEGATIVE mg/dL
Ketones, POC UA: NEGATIVE mg/dL
Leukocytes, UA: NEGATIVE
Nitrite, UA: POSITIVE — AB
Protein Ur, POC: 30 mg/dL — AB
Spec Grav, UA: 1.025 (ref 1.010–1.025)
Urobilinogen, UA: 0.2 U/dL
pH, UA: 5.5 (ref 5.0–8.0)

## 2023-11-01 MED ORDER — METHOCARBAMOL 500 MG PO TABS: 500.0000 mg | ORAL_TABLET | Freq: Three times a day (TID) | ORAL | 0 refills | Status: DC | PRN

## 2023-11-01 MED ORDER — CEPHALEXIN 500 MG PO CAPS: 500.0000 mg | ORAL_CAPSULE | Freq: Two times a day (BID) | ORAL | 0 refills | Status: AC

## 2023-11-01 MED ORDER — RIZATRIPTAN BENZOATE 10 MG PO TBDP: 10.0000 mg | ORAL_TABLET | ORAL | 0 refills | Status: DC | PRN

## 2023-11-01 NOTE — Patient Instructions (Signed)
 Thank you for coming in today! Here is a summary of what we discussed:  - Your heart rate has improved since earlier today and your EKG was normal.  Please let us  know if you notice your heart rate increasing again or if you develop any symptoms like increased shortness of breath, chest pain, palpitations.  - I sent in 7 days of antibiotics to treat your UTI.  Please be sure to complete the entire course.  We will follow-up if the urine culture shows anything different  Please call the clinic at [phone] if your symptoms worsen or you have any concerns.  Best, Dr Adele

## 2023-11-01 NOTE — Progress Notes (Signed)
    SUBJECTIVE:   CHIEF COMPLAINT / HPI:   Seen earlier today, found to be tachycardic to the 120s-130s.  Advised to return to clinic for EKG and D-dimer check.  Per note from Dr. Lonnie:  Tachycardia noted during todays visit. Patient is asymptomatic. Previously has had mild tachycardia at visits. Has been following with cardiology and recently underwent coronary PET CT, which showed EF of 72 and normal LV perfusion. Patient to follow up with pulmonology for shortness of breath she has had for months. Chest xray showed possible bronchiolitis. As patient has follow up with pulmonology already scheduled, no focal findings on exam, and oxygen saturation of 96%, will await further evaluation with pulmonology.   States her heart rate is normally around 105 Chronically SOB, not new from baseline No new chest pain No palpitations Chronic feeling of off balance and some dizziness. Also has vision issues chronically.  Dysuria -UA collected earlier today positive for nitrites, urine culture sent - Sulfa allergy  PERTINENT  PMH / PSH: Migraine, hypertension, hypothyroidism, bipolar, fibromyalgia  OBJECTIVE:   BP 134/78   Pulse (!) 103   Wt 205 lb 12.8 oz (93.4 kg) Comment: Pt had visit earlier today.  SpO2 96%   BMI 35.33 kg/m   - General: No acute distress. Awake and conversant. - Eyes: Normal conjunctiva, anicteric. Round symmetric pupils. - ENT: Hearing grossly intact. No nasal discharge. - Neck: Neck is supple. No masses or thyromegaly. - Respiratory: Respirations are non-labored. - Skin: No visible rashes or ulcers. - Psych: Alert and oriented. Cooperative, Appropriate mood and affect, Normal judgment. - MSK: Normal ambulation. - Neuro: Sensation and CN II-XII grossly normal.   ASSESSMENT/PLAN:   Assessment & Plan Tachycardia EKG with HR 98 and NSR.  Pulse ox during visit with heart rate in the 90s to low 100s. Pt remains asymptomatic and states heart rate usually is  slightly above 100 at baseline.  Will hold off on lab work for now.  Reviewed return precautions.  Patient to follow-up with pulmonology for ongoing shortness of breath.  Acute cystitis without hematuria UA with positive nitrites and patient reported dysuria at visit earlier today. Will treat with 7 days of Keflex.  Will follow-up with patient if urine culture indicates need for different antibiotic. - cephALEXin (KEFLEX) 500 MG capsule; Take 1 capsule (500 mg total) by mouth 2 (two) times daily.  Dispense: 14 capsule; Refill: 0     Rea Raring, MD [facility]

## 2023-11-01 NOTE — Assessment & Plan Note (Addendum)
 TSH elevated at last visit, increased synthroid  to 175 mcg. Will recheck today

## 2023-11-01 NOTE — Assessment & Plan Note (Addendum)
 Continues to feel fatigue. Would like to check CBC and B12. Hg WNL at last visit.  - CBC - B12

## 2023-11-01 NOTE — Patient Instructions (Addendum)
 It was wonderful to see you today.  Please bring ALL of your medications with you to every visit.   Today we talked about:  We are checking some labs. I will call you if these results are abnormal.  We discussed your pain, we will trial the methocarbamol .   Thank you for choosing [facility].   Please call [phone] with any questions about today's appointment.  Please arrive at least 15 minutes prior to your scheduled appointments.   If you had blood work today, I will send you a MyChart message or a letter if results are normal. Otherwise, I will give you a call.   If you had a referral placed, they will call you to set up an appointment. Please give us  a call if you don't hear back in the next 2 weeks.   If you need additional refills before your next appointment, please call your pharmacy first.   Do you need your medications delivered to your home?   We'll send your prescription to the [facility] for delivery.          Address: [address] [place], [place], [place] [address]          Phone: [phone]  Please call the [facility] to speak with a pharmacist and set up your home medication delivery. If you have any questions, feel free to contact us  -- we're happy to help!  Other [facility] that offer affordable prices on both prescriptions and over-the-counter items, as well as convenient services like vaccinations, are  [facility]         Address:  [address], [place], [place] [address]         Phone: [phone]  [facility], located in the [facility]        Address: [address] [place], [place] [address]        Phone: [phone]  [facility], at [facility]       Address: [address], [place], [place] [address]       Phone: [phone]  [facility], at [facility]       Address: [address] Floor, [place], [place] [address]       Phone: [phone]  You should follow up in our clinic in Return in about 2 months (around 01/01/2024).  Gloriann Ogren, MD Family Medicine

## 2023-11-01 NOTE — Telephone Encounter (Signed)
 [facility] calls nurse line in regards to methocarbamol  prescription.   She reports two different sets of directions are listed on prescription.   Please clarify and resend.   Will forward to PCP.

## 2023-11-01 NOTE — Assessment & Plan Note (Deleted)
-   disintegrating tablet -

## 2023-11-01 NOTE — Telephone Encounter (Signed)
 Called patient to return to clinic due to concern for elevated heart rate and ongoing shortness of breath. Patient endorsed feeling safe to get to clinic. Will have patient return to clinic for d-dimer and EKG

## 2023-11-01 NOTE — Progress Notes (Signed)
    SUBJECTIVE:   CHIEF COMPLAINT / HPI:   Methocarbamol    Patient presents with the following items for discussion: - still experiencing day time fatigue, would like to check CBC and B12 - TSH needs to be checked as it was high last - Methocarbamol  works for pain, would like a script - Taking azo for UTI prevention, has symptoms of increased frequency and some itching/burning but is not sure that she has UTI    OBJECTIVE:   BP 118/82   Pulse (!) 126   Ht 5' 4 (1.626 m)   Wt 205 lb 12.8 oz (93.4 kg)   SpO2 96%   BMI 35.33 kg/m   General: A&O, NAD HEENT: No sign of trauma, EOM grossly intact Cardiac: tachycardic, regular rhythm, no m/r/g Respiratory: CTAB, normal WOB, no w/c/r GI: Soft, non distended  Extremities: NTTP Neuro: Normal gait, moves all four extremities appropriately. Psych: Appropriate mood and affect   ASSESSMENT/PLAN:   Assessment & Plan Hypothyroidism (acquired) TSH elevated at last visit, increased synthroid  to 175 mcg. Will recheck today  Urinary frequency Unclear etiology. Patient would like to just do UA for today.  - UA Daytime sleepiness Continues to feel fatigue. Would like to check CBC and B12. Hg WNL at last visit.  - CBC - B12  Other chronic pain Patient with history of back pain. Robaxin  trialed previously and believes this helps. Discussed this medication can cause dizziness and drowsiness and is generally not recommended for patients over 65. Risks and benefits discussed. Will prescribe 500mg  q8prn for temporary relief while patient awaits orthopaedic follow up.  Tachycardia Tachycardia noted during todays visit. Patient is asymptomatic. Previously has had mild tachycardia at visits. Has been following with cardiology and recently underwent coronary PET CT, which showed EF of 72 and normal LV perfusion. Patient to follow up with pulmonology for shortness of breath she has had for months. Chest xray showed possible bronchiolitis. As patient  has follow up with pulmonology already scheduled, no focal findings on exam, and oxygen saturation of 96%, will await further evaluation with pulmonology.    2 month follow up   Gloriann Ogren, MD [facility]

## 2023-11-01 NOTE — Telephone Encounter (Signed)
 Re sent  script.

## 2023-11-02 ENCOUNTER — Ambulatory Visit: Payer: Self-pay | Admitting: Family Medicine

## 2023-11-02 LAB — CBC WITH DIFFERENTIAL/PLATELET
Basophils Absolute: 0.1 x10E3/uL (ref 0.0–0.2)
Basos: 1 %
EOS (ABSOLUTE): 0.6 x10E3/uL — ABNORMAL HIGH (ref 0.0–0.4)
Eos: 6 %
Hematocrit: 46.9 % — ABNORMAL HIGH (ref 34.0–46.6)
Hemoglobin: 15.4 g/dL (ref 11.1–15.9)
Immature Grans (Abs): 0.1 x10E3/uL (ref 0.0–0.1)
Immature Granulocytes: 1 %
Lymphocytes Absolute: 1.5 x10E3/uL (ref 0.7–3.1)
Lymphs: 15 %
MCH: 32 pg (ref 26.6–33.0)
MCHC: 32.8 g/dL (ref 31.5–35.7)
MCV: 98 fL — ABNORMAL HIGH (ref 79–97)
Monocytes Absolute: 0.8 x10E3/uL (ref 0.1–0.9)
Monocytes: 8 %
Neutrophils Absolute: 6.9 x10E3/uL (ref 1.4–7.0)
Neutrophils: 69 %
Platelets: 341 x10E3/uL (ref 150–450)
RBC: 4.81 x10E6/uL (ref 3.77–5.28)
RDW: 12.9 % (ref 11.7–15.4)
WBC: 10 x10E3/uL (ref 3.4–10.8)

## 2023-11-02 LAB — TSH+FREE T4
Free T4: 1.13 ng/dL (ref 0.82–1.77)
TSH: 1.22 u[IU]/mL (ref 0.450–4.500)

## 2023-11-02 LAB — VITAMIN B12: Vitamin B-12: >2000 pg/mL — ABNORMAL HIGH (ref 232–1245)

## 2023-11-03 ENCOUNTER — Telehealth (INDEPENDENT_AMBULATORY_CARE_PROVIDER_SITE_OTHER): Admitting: Psychiatry

## 2023-11-03 ENCOUNTER — Telehealth: Payer: Self-pay | Admitting: Family Medicine

## 2023-11-03 ENCOUNTER — Other Ambulatory Visit: Payer: Self-pay | Admitting: Family Medicine

## 2023-11-03 ENCOUNTER — Encounter: Payer: Self-pay | Admitting: Family Medicine

## 2023-11-03 DIAGNOSIS — F411 Generalized anxiety disorder: Secondary | ICD-10-CM

## 2023-11-03 DIAGNOSIS — F3160 Bipolar disorder, current episode mixed, unspecified: Secondary | ICD-10-CM | POA: Diagnosis not present

## 2023-11-03 DIAGNOSIS — E039 Hypothyroidism, unspecified: Secondary | ICD-10-CM

## 2023-11-03 DIAGNOSIS — F909 Attention-deficit hyperactivity disorder, unspecified type: Secondary | ICD-10-CM | POA: Diagnosis not present

## 2023-11-03 LAB — URINE CULTURE

## 2023-11-03 MED ORDER — AMPHETAMINE-DEXTROAMPHETAMINE 10 MG PO TABS: 10.0000 mg | ORAL_TABLET | Freq: Two times a day (BID) | ORAL | 0 refills | Status: AC

## 2023-11-03 MED ORDER — PAROXETINE HCL 20 MG PO TABS: ORAL_TABLET | ORAL | 0 refills | Status: DC

## 2023-11-03 MED ORDER — AMPHETAMINE-DEXTROAMPHETAMINE 10 MG PO TABS: 10.0000 mg | ORAL_TABLET | Freq: Two times a day (BID) | ORAL | 0 refills | Status: DC

## 2023-11-03 MED ORDER — FLUOXETINE HCL 20 MG PO CAPS: 20.0000 mg | ORAL_CAPSULE | Freq: Every day | ORAL | 1 refills | Status: DC

## 2023-11-03 MED ORDER — BUSPIRONE HCL 10 MG PO TABS: 10.0000 mg | ORAL_TABLET | Freq: Three times a day (TID) | ORAL | 1 refills | Status: AC

## 2023-11-03 MED ORDER — QUETIAPINE FUMARATE 400 MG PO TABS: 400.0000 mg | ORAL_TABLET | Freq: Every day | ORAL | 0 refills | Status: DC

## 2023-11-03 NOTE — Addendum Note (Signed)
 Addended by: CARVIN CROCK on: 11/03/2023 02:53 PM   Modules accepted: Level of Service

## 2023-11-03 NOTE — Telephone Encounter (Signed)
 Called patient to discuss high B12 results. Verified name and DOB. Reports she does not take supplementation and B12 has been high for years. Patient also saw GI for this as well as mildly elevated ALT. Ultrasound conducted showed hepatic steatosis (see media tab). Encouraged patient to follow up with GI. Will continue to monitor liver enzymes.

## 2023-11-03 NOTE — Patient Instructions (Addendum)
 Decrease Paxil  from 20 mg to 10 mg on October 30th and after 2 weeks of 10 mg, stop the medication on November 13th is the last day   Increase Prozac  to 20 mg

## 2023-11-04 ENCOUNTER — Encounter: Payer: Self-pay | Admitting: Family Medicine

## 2023-11-04 ENCOUNTER — Other Ambulatory Visit: Payer: Self-pay | Admitting: Family Medicine

## 2023-11-04 ENCOUNTER — Telehealth (HOSPITAL_COMMUNITY): Payer: Self-pay

## 2023-11-04 DIAGNOSIS — M810 Age-related osteoporosis without current pathological fracture: Secondary | ICD-10-CM

## 2023-11-04 DIAGNOSIS — R110 Nausea: Secondary | ICD-10-CM

## 2023-11-04 NOTE — Telephone Encounter (Signed)
 Medication mangement - Call with Stuart, pharmacist at pt;s [facility] to follow up on a call from pt with their concerns for order verification of how pt was to be titrating down on Paxil  and going up on Prozac . Collateral verified all received orders previously from Dr. Izella and that pt was to now be doing Paxil  20 mg, one tablet for one week and then a 0.5 tablet for 2 weeks, per orders 11/03/23.  Collateral  stated patient should still have plenty of the Paxil  20 mg tablets from most recent order filled 10/13/23 to do the taper.  Called pt who verified understanding of Dr. Ginny taper instructions and that she does have enough 20 mg Paxil  tablets to do the 3 week taper off, more than 14 tablets total while also understanding how to go up on her Prozac  to 20 mg.  Patient reported no more concerns and agreed to call back if any issues with the transition.

## 2023-11-07 MED ORDER — CALCIUM 1000 + D 1000-20 MG-MCG PO TABS: 1.0000 | ORAL_TABLET | Freq: Every day | ORAL | 2 refills | Status: DC

## 2023-11-08 ENCOUNTER — Encounter: Payer: Self-pay | Admitting: Family Medicine

## 2023-11-08 ENCOUNTER — Other Ambulatory Visit: Payer: Self-pay | Admitting: Family Medicine

## 2023-11-08 DIAGNOSIS — R21 Rash and other nonspecific skin eruption: Secondary | ICD-10-CM

## 2023-11-08 DIAGNOSIS — M1610 Unilateral primary osteoarthritis, unspecified hip: Secondary | ICD-10-CM

## 2023-11-08 DIAGNOSIS — G8929 Other chronic pain: Secondary | ICD-10-CM

## 2023-11-08 MED ORDER — DICLOFENAC SODIUM 1 % EX GEL: 2.0000 g | Freq: Four times a day (QID) | CUTANEOUS | 1 refills | Status: AC

## 2023-11-08 MED ORDER — CLOTRIMAZOLE 1 % EX CREA: 1.0000 | TOPICAL_CREAM | Freq: Two times a day (BID) | CUTANEOUS | 0 refills | Status: DC

## 2023-11-08 NOTE — Progress Notes (Deleted)
 Office Visit Note  Patient: Jodi Gilmore             Date of Birth: February 15, 1955           MRN: [id number]             PCP: Lonnie Earnest, MD Referring: Lonnie Earnest, MD Visit Date: 11/21/2023   Subjective:  No chief complaint on file.   History of Present Illness: Jodi Gilmore is a 68 y.o. female here for follow up for joint pain in multiple areas.   Previous HPI 06/01/2022 Jodi Gilmore is a 68 y.o. female here for follow up for joint pain in multiple areas particularly worse in upper body involving everywhere from neck down into her low back and into both hands.  We previously saw her in 2022 due to history of multiple suspected rheumatic conditions with symptoms of joint pain and swelling, eye and mouth dryness, and skin lesions that have been concerning for possible psoriasis s or cutaneous vasculitis.  Later that year she went for right hip joint replacement with a good benefit in her pain and mobility in that area.  Joint pain issues have been ongoing though some specific problems like the swelling have been intermittent.  Worst pain is in her back and notices increased symptoms with any specific position or activity whether sitting standing or walking for more than 5 minutes continuously.  Some days also feels just widespread pain throughout her entire body but still usually worse in the upper than lower distribution.  Has very frequent headaches sometimes rating up the back of the head but sometimes bilateral in the front.  Occasionally gets double vision but has nearly normal acuity at other times.  She had evaluation with ophthalmology and neurology previously appear to be some disagreement about possibility of ocular myasthenia gravis symptoms but negative on evaluation with formal neurologic testing.  Not currently on any disease specific treatment for this.  She went for nerve conduction study on February 27 this year that was apparently normal.     Previous  HPI 09/01/20 Jodi Gilmore is a 68 y.o. female here for evaluation with history of multiple rheumatic conditions apparently RA, sjogren's syndrome, scleroderma, and raynaud's and with history of hypothyroidism. She has a somewhat complicated history with numerous symptoms both focal symptoms including joint pain and swelling, eye and mouth dryness, skin lesions reportedly both psoriasis and behcets pathology previously seen. At the moment skin disease is not very active. Joint pain is worst in the left hip with severe degenerative arthritis already seen at orthopedic surgery clinic. She also has osteoarthritis of other sites with worsening changes of fingers on bilateral hands. However she does have multiple generalized symptoms and has had labile thyroid  disease with difficult to control  levothyroxine  dose changes. Chronic dry eyes and mouth with what sounds like possible abrasion but no past positive biopsy specific for sjogren syndrome. Previous autoimmune serology has been somewhat variable, with mixed positive markers and sometimes negative ANA. CRP has been persistently elevated. She reports occasional oral ulcers usually on sides. She denies lymphadenopathy, focal alopecia, photosensitive skin rashes or residual hyperpigmentation, or history of blood clots.   Previous patient of Dr. Neysa with [facility] problems treated reviewed including ADD, hypothyroidism, chronic back pain, fatigue, depression, and vitamin D  deficiency.   Labs reviewed 05/2020 TSH 13.4 fT4 1.07   02/2020 ANA neg RNA Poly III neg Scl-70 neg CCP 84 hsCRP 7.98 HLA-B27 neg BMP eGFR 52   08/2018  hsCRP >10.0 TSH 20.28     Imaging reviewed 06/19/20 MRI Lumbar spine IMPRESSION: Convex right scoliosis. Right greater than left subarticular recess narrowing at L1-2 due to bilateral disc protrusions. Mild left subarticular recess and moderate left foraminal narrowing at L2-3. Left subarticular recess and  foraminal protrusion at L3-4 could impact the descending left L4 root and causes mild to moderate left foraminal narrowing. Moderately severe left and mild-to-moderate right foraminal narrowing at L4-5 where there is moderate central canal stenosis. Mild to moderate right foraminal narrowing L5-S1. The central canal and left foramen are open.   05/08/20 Xray left hip Severe arthritic changes of the left hip.   05/08/20 Xray left knee Minimal arthritic changes of the left knee. No joint effusion. The soft tissues are unremarkable.     No Rheumatology ROS completed.   PMFS History:  Patient Active Problem List   Diagnosis Date Noted   Otalgia of both ears 11/11/2022   Hepatic steatosis 11/11/2022   Tarry stools 08/12/2022   Lymphedema 08/12/2022   Chest pain 06/30/2022   Daytime sleepiness 06/30/2022   Other fatigue 06/01/2022   Esotropia 04/27/2022   Dysphagia 04/27/2022   Diplopia 11/26/2021   Status post left hip replacement 10/16/2020   Idiopathic scoliosis of lumbar spine 09/01/2020   Essential (primary) hypertension 07/17/2020   HSV (herpes simplex virus) anogenital infection 06/26/2020   Generalized anxiety disorder 06/04/2020   Attention deficit hyperactivity disorder (ADHD) 06/04/2020   Migraine headache with aura 05/17/2020   HLD (hyperlipidemia) 05/17/2020   Elevated C-reactive protein (CRP) 02/13/2020   Hip arthritis 02/13/2020   Bipolar disorder (HCC) 02/13/2020   Fibromyalgia 02/13/2020   Hypothyroidism (acquired) 01/03/2020   Disequilibrium 01/03/2020   Healthcare maintenance 01/03/2020    Past Medical History:  Diagnosis Date   Allergy    Anemia    Anxiety    situational    Asthma    yeras ago - not current    Behcet's disease (HCC)    Bell's palsy    Cataract    both removed    Depression    situational -    Diplopia    Elevated blood pressure reading    no BP meds currently    Esotropia    Family history of adverse reaction to anesthesia     Fibromyalgia    GERD (gastroesophageal reflux disease)    Graves disease 1991   Heart murmur    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    Hip pain    HLA B27 (HLA B27 positive)    Hyperlipidemia    Hypertension    Knee pain    Memory changes    Morphea scleroderma    Neuromuscular disorder (HCC)    Raynauds    Osteoporosis    Pneumonia    Psoriatic arthritis (HCC)    seen in ears only   Raynaud's disease    Sjogren syndrome with dental involvement     Family History  Problem Relation Age of Onset   Heart disease Mother    Heart attack Mother    Congestive Heart Failure Mother    Dementia Mother    Heart disease Father 55   Heart attack Father    Dementia Sister    Hemachromatosis Sister    Parkinson's disease Sister    Allergies Son    Colon cancer Neg Hx    Colon polyps Neg Hx    Esophageal cancer Neg Hx  Stomach cancer Neg Hx    Rectal cancer Neg Hx    BRCA 1/2 Neg Hx    Breast cancer Neg Hx    Past Surgical History:  Procedure Laterality Date   CYST REMOVAL HAND     left arm posterior   laproscopy     SALPINGECTOMY  1980   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   Procedure: LEFT TOTAL HIP ARTHROPLASTY ANTERIOR APPROACH;  Surgeon: Vernetta Lonni GRADE, MD;  Location: [facility];  Service: Orthopedics;  Laterality: Left;   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     Social History   Social History Narrative   Not on file   Immunization History  Administered Date(s) Administered   Fluad Quad(high Dose 65+) 11/26/2021   Moderna Sars-Covid-2 Vaccination 11/29/2019, 12/25/2019   PFIZER Comirnaty(Gray Top)Covid-19 Tri-Sucrose Vaccine 06/24/2020   PNEUMOCOCCAL CONJUGATE-20 11/26/2021     Objective: Vital Signs: There were no vitals taken for this visit.   Physical Exam   Musculoskeletal Exam: ***  CDAI Exam: CDAI Score: -- Patient Global: --; Provider Global: -- Swollen: --; Tender: -- Joint Exam 11/21/2023   No joint  exam has been documented for this visit   There is currently no information documented on the homunculus. Go to the Rheumatology activity and complete the homunculus joint exam.  Investigation: No additional findings.  Imaging: NM PET CT CARDIAC PERFUSION MULTI W/ABSOLUTE BLOODFLOW Result Date: 10/19/2023   Mild fixed anterior perfusion defect with normal wall motion in this region, consistent with artifact.  Myocardial blood flow reserve is midly reduced but this due to high rest flow; stress flow is normal.  No high high findings such as TID or drop in EF with stress.  Overall, study is low risk   The study is normal. The study is low risk.   LV perfusion is normal. There is no evidence of ischemia. There is no evidence of infarction.   Rest left ventricular function is normal. Rest EF: 71%. Stress left ventricular function is normal. Stress EF: 72%. End diastolic cavity size is normal. End systolic cavity size is normal.   Myocardial blood flow was computed to be 1.38ml/g/min at rest and 2.67ml/g/min at stress. Global myocardial blood flow reserve was 1.82 and was mildly abnormal.   Coronary calcium  was present on the attenuation correction CT images. Moderate coronary calcifications were present. Coronary calcifications were present in the left anterior descending artery distribution(s).   Electronically signed by Lonni Nanas, MD CLINICAL DATA:  This over-read does not include interpretation of cardiac or coronary anatomy or pathology. No interpretation the PET data set. The cardiac PET interpretation by the cardiologist is attached. COMPARISON:  None Available. FINDINGS: Limited view of the lung parenchyma demonstrates no suspicious nodularity. Airways are normal. Limited view of the mediastinum demonstrates no adenopathy. Esophagus normal. Limited view of the upper abdomen demonstrates low-attenuation liver. Limited view of the skeleton and chest wall is unremarkable. IMPRESSION: Hepatic  steatosis Electronically Signed   By: Jackquline Boxer M.D.   On: 10/19/2023 15:24   Recent Labs: Lab Results  Component Value Date   WBC 10.0 11/01/2023   HGB 15.4 11/01/2023   PLT 341 11/01/2023   NA 140 05/24/2023   K 4.1 05/24/2023   CL 102 05/24/2023   CO2 20 05/24/2023   GLUCOSE 96 05/24/2023   BUN 21 05/24/2023   CREATININE 0.94 05/24/2023   BILITOT 0.2 05/24/2023   ALKPHOS 158 (H) 05/24/2023   AST 31 05/24/2023   ALT 38 (H) 05/24/2023  PROT 6.8 05/24/2023   ALBUMIN 4.6 05/24/2023   CALCIUM  9.5 05/24/2023   GFRAA 60 02/12/2020   QFTBGOLDPLUS Negative 03/22/2023    Speciality Comments: No specialty comments available.  Procedures:  No procedures performed Allergies: Iodinated contrast media, Prohance [gadoteridol], Ioversol, Latex, Ptu [propylthiouracil], Sulfa antibiotics, and Tapazole [methimazole]   Assessment / Plan:     Visit Diagnoses: No diagnosis found.  ***  Orders: No orders of the defined types were placed in this encounter.  No orders of the defined types were placed in this encounter.    Follow-Up Instructions: No follow-ups on file.   Angellina Ferdinand M Kristia Jupiter, CMA  Note - This record has been created using Animal nutritionist.  Chart creation errors have been sought, but may not always  have been located. Such creation errors do not reflect on  the standard of medical care.

## 2023-11-10 ENCOUNTER — Encounter: Payer: Self-pay | Admitting: Family Medicine

## 2023-11-11 ENCOUNTER — Encounter: Payer: Self-pay | Admitting: Pulmonary Disease

## 2023-11-14 ENCOUNTER — Encounter: Payer: Self-pay | Admitting: Radiology

## 2023-11-17 ENCOUNTER — Other Ambulatory Visit: Payer: Self-pay | Admitting: *Deleted

## 2023-11-17 ENCOUNTER — Telehealth: Payer: Self-pay | Admitting: *Deleted

## 2023-11-17 ENCOUNTER — Other Ambulatory Visit: Payer: Self-pay

## 2023-11-17 NOTE — Patient Instructions (Signed)
 Visit Information  Thank you for taking time to visit with me today. Please don't hesitate to contact me if I can be of assistance to you before our next scheduled appointment.  Your next care management appointment is by telephone on 12/22/23  at 1130 am  Please call the care guide team at [phone] if you need to cancel, schedule, or reschedule an appointment.   Please call the Suicide and Crisis Lifeline: 988 call the [place]  National Suicide Prevention Lifeline: (340)144-0124 or TTY: (681)410-6827 TTY (760) 693-5366) to talk to a trained counselor call 1-800-273-TALK (toll free, 24 hour hotline) go to [facility] [address] [place] [phone]) call the [facility] Crisis Line: ([phone])394-2338 call 911 if you are experiencing a Mental Health or Behavioral Health Crisis or need someone to talk to.  Rain Wilhide, RN, BSN, Theatre Manager [employer] [phone]

## 2023-11-17 NOTE — Patient Outreach (Signed)
 Complex Care Management   Visit Note  11/17/2023  Name:  Jodi Gilmore MRN: [id number] DOB: Mar 30, 1955  Situation: Referral received for Complex Care Management related to SDOH Barriers:  Transportation Housing Insecurity Food insecurity and HTN I obtained verbal consent from Patient.  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    Hip pain    HLA B27 (HLA B27 positive)    Hyperlipidemia    Hypertension    Knee pain    Memory changes    Morphea scleroderma    Neuromuscular disorder (HCC)    Raynauds    Osteoporosis    Pneumonia    Psoriatic arthritis (HCC)    seen in ears only   Raynaud's disease    Sjogren syndrome with dental involvement     Assessment: Patient Reported Symptoms:  Cognitive Cognitive Status: No symptoms reported, Able to follow simple commands, Insightful and able to interpret abstract concepts, Normal speech and language skills, Alert and oriented to person, place, and time Cognitive/Intellectual Conditions Management [RPT]: None reported or documented in medical history or problem list   Health Maintenance Behaviors: Annual physical exam, Sleep adequate, Stress management, Healthy diet Healing Pattern: Average Health Facilitated by: Healthy diet, Pain control, Prayer/meditation, Stress management, Rest  Neurological Neurological Review of Symptoms: No symptoms reported Neurological Management  Strategies: Adequate rest, Routine screening  HEENT HEENT Symptoms Reported: Other: HEENT Management Strategies: Adequate rest, Routine screening HEENT Comment: Patient has PMH of Behcet's disease. She often has genital sores or ulcers in her mouth.  She is prescribed medication for the Behcet's disease.  Patient reports that she uses Colchicine  to help with Behcet's disease.    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  Respiratory Respiratory Symptoms Reported: Shortness of breath, Productive cough Other Respiratory Symptoms: Patient reports that she has been short of breath when moving around.  She reports that she has a hx of Asbestos exposure.  She reports that has appointment with Pulmonary on 11/23/23 to address continues shortness of breath. Respiratory Management Strategies: Adequate rest, Routine screening Respiratory Self-Management Outcome: 3 (uncertain)  Endocrine Endocrine Symptoms Reported: No symptoms reported Is patient diabetic?: No Endocrine Self-Management Outcome: 4 (good)  Gastrointestinal Gastrointestinal Symptoms Reported: Constipation Additional Gastrointestinal Details: Patient reports occassional constipation.  She reports that she takes Miralax to help with Constipation. Gastrointestinal Management Strategies: Adequate rest, Diet modification Gastrointestinal Self-Management Outcome: 3 (uncertain)    Genitourinary Genitourinary Symptoms Reported: Incontinence, Frequency Additional Genitourinary Details: Patient reports that she has periods of incontinence.  Patient is were  diapers to help with incontinence.  Patient has a history of bladder infections. Genitourinary Management  Strategies: Adequate rest Genitourinary Self-Management Outcome: 3 (uncertain)  Integumentary Integumentary Symptoms Reported: Other Other Integumentary Symptoms: Patient has lesions in her mouth due to Behcets. Skin Management Strategies: Adequate rest, Medication therapy, Coping strategies Skin Self-Management Outcome: 3 (uncertain)  Musculoskeletal Musculoskelatal Symptoms Reviewed: Back pain, Difficulty walking, Limited mobility Additional Musculoskeletal Details: Patient is able to get around with a walker   Falls in the past year?: No Number of falls in past year: 1 or less Patient at Risk for Falls Due to: No Fall Risks  Psychosocial Psychosocial Symptoms Reported: Depression - if selected complete PHQ 2-9, Anxiety - if selected complete GAD Additional Psychological Details: Patient is active with [facility] Behavioral Management Strategies: Counseling, Coping strategies, Adequate rest, Medication therapy Behavioral Health Self-Management Outcome: 3 (uncertain) Major Change/Loss/Stressor/Fears (CP): Medical condition, self Techniques to Cope with Loss/Stress/Change: Counseling Quality of Family Relationships: involved, helpful, supportive Do you feel physically threatened by others?: No    11/17/2023    PHQ2-9 Depression Screening   Little interest or pleasure in doing things Several days  Feeling down, depressed, or hopeless Several days  PHQ-2 - Total Score 2  Trouble falling or staying asleep, or sleeping too much More than half the days  Feeling tired or having little energy Several days  Poor appetite or overeating     Feeling bad about yourself - or that you are a failure or have let yourself or your family down Several days  Trouble concentrating on things, such as reading the newspaper or watching television Several days  Moving or speaking so slowly that other people could have noticed.  Or the opposite - being so fidgety or restless that you have been moving around a lot more  than usual Not at all  Thoughts that you would be better off dead, or hurting yourself in some way Not at all  PHQ2-9 Total Score 7  If you checked off any problems, how difficult have these problems made it for you to do your work, take care of things at home, or get along with other people Somewhat difficult  Depression Interventions/Treatment Currently on Treatment, Counseling, Medication    There were no vitals filed for this visit.  Medications Reviewed Today     Reviewed by Jorja Nichole LABOR, RN (Case Manager) on 11/17/23 at 1218  Med List Status: <None>   Medication Order Taking? Sig Documenting Provider Last Dose Status Informant  alendronate  (FOSAMAX ) 10 MG tablet [id number] Yes Take 1 tablet (10 mg total) by mouth daily before breakfast. Take with a full glass of water on an empty stomach. Janna Ferrier, DO  Active   amphetamine -dextroamphetamine  (ADDERALL) 10 MG tablet [id number] Yes Take 1 tablet (10 mg total) by mouth 2 (two) times daily with a meal. Izella Ismael NOVAK, MD  Active   amphetamine -dextroamphetamine  (ADDERALL) 10 MG tablet [id number]  Take 1 tablet (10 mg total) by mouth 2 (two) times daily with a meal. Izella Ismael NOVAK, MD  Active   atorvastatin  (LIPITOR) 40 MG tablet [id number] Yes Take 1 tablet (40 mg total) by mouth daily. Gomes, Adriana, DO  Active   busPIRone  (BUSPAR ) 10 MG tablet [id number] Yes Take 1 tablet (10 mg total) by mouth 3 (three) times daily. Izella Ismael NOVAK, MD  Active   Calcium  Carb-Cholecalciferol  (CALCIUM  1000 + D) 1000-20 MG-MCG TABS [id number] Yes Take 1 tablet by mouth daily. Lonnie Earnest, MD  Active  cephALEXin (KEFLEX) 500 MG capsule [id number]  Take 1 capsule (500 mg total) by mouth 2 (two) times daily.  Patient not taking: Reported on 11/17/2023   Adele Song, MD  Active   cetirizine  (ZYRTEC  ALLERGY) 10 MG tablet [id number] Yes Take 1 tablet (10 mg total) by mouth daily. Simmons-Robinson, Makiera, MD  Active Self  clotrimazole  (LOTRIMIN ) 1 %  cream [id number] Yes Apply 1 Application topically 2 (two) times daily. To right underarm Baloch, Mahnoor, MD  Active   colchicine  0.6 MG tablet [id number] Yes Take 1 tablet (0.6 mg total) by mouth daily. Marlee Lynwood NOVAK, MD  Active   cycloSPORINE  (RESTASIS ) 0.05 % ophthalmic emulsion [id number] Yes Place 1 drop into both eyes 2 (two) times daily. Lafe Domino, DO  Active   diclofenac  Sodium (VOLTAREN ) 1 % GEL [id number] Yes Apply 2 g topically 4 (four) times daily. Baloch, Mahnoor, MD  Active   FLUoxetine  (PROZAC ) 20 MG capsule [id number] Yes Take 1 capsule (20 mg total) by mouth daily. Hoang, Daniela B, MD  Active   fluticasone  (FLONASE ) 50 MCG/ACT nasal spray [id number] Yes USE 2 SPRAYS IN EACH NOSTRIL ONCE TIME DAILY Marlee Lynwood NOVAK, MD  Active   levothyroxine  (SYNTHROID ) 175 MCG tablet [id number] Yes TAKE ONE TABLET BY MOUTH EVERY MORNING BEFORE BREAKFAST Baloch, Mahnoor, MD  Active   lidocaine  (XYLOCAINE ) 2 % solution [id number] Yes Swish and spit 15mL no more than every 4 hours for oral ulcer pain.  Patient taking differently: Swish and spit 15mL no more than every 4 hours for oral ulcer pain.   Marlee Lynwood NOVAK, MD  Active   lisinopril  (ZESTRIL ) 2.5 MG tablet [id number] Yes Take 1 tablet (2.5 mg total) by mouth at bedtime. Lafe Domino, DO  Active   methocarbamol  (ROBAXIN ) 500 MG tablet [id number] Yes Take 1 tablet (500 mg total) by mouth every 8 (eight) hours as needed for muscle spasms. Baloch, Mahnoor, MD  Active   omeprazole  (PRILOSEC) 40 MG capsule [id number] Yes Take 1 capsule (40 mg total) by mouth in the morning and at bedtime. Joshua Domino, DO  Active   ondansetron  (ZOFRAN ) 4 MG tablet [id number] Yes TAKE ONE TABLET BY MOUTH EVERY 8 HOURS AS NEEDED FOR NAUSEA AND/OR VOMITING Baloch, Mahnoor, MD  Active   PARoxetine  (PAXIL ) 20 MG tablet [id number] Yes Take 1 tablet (20 mg total) by mouth daily for 7 days, THEN 0.5 tablets (10 mg total) daily for 14 days. Izella Ismael NOVAK, MD  Active    QUEtiapine  (SEROQUEL ) 400 MG tablet [id number] Yes Take 1 tablet (400 mg total) by mouth at bedtime. Izella Ismael NOVAK, MD  Active   rizatriptan  (MAXALT ) 10 MG tablet [id number] Yes Take 1 tablet (10 mg total) by mouth as needed for migraine. May repeat in 2 hours if needed. Do not take more than THREE doses in one day. Do not use more than 10 days per month. Diona Perkins, MD  Active   rizatriptan  (MAXALT -MLT) 10 MG disintegrating tablet [id number] Yes Take 1 tablet (10 mg total) by mouth as needed for migraine. May repeat in 2 hours if needed Lonnie, Mahnoor, MD  Active   valACYclovir  (VALTREX ) 500 MG tablet [id number] Yes TAKE ONE TABLET BY MOUTH TWICE A DAY FOR 7 DAYS Marlee Lynwood NOVAK, MD  Active             Recommendation:   PCP Follow-up Specialty provider follow-up : Pulmonology-11/23/23; [facility]-12/16/23; Neurology-03/05/24 Continue Current Plan of Care  Follow Up Plan:   Telephone follow-up in 1  month: 12/22/23 @ 1130 am  Mekesha Solomon, RN, BSN, Theatre Manager [employer] [phone]

## 2023-11-20 ENCOUNTER — Other Ambulatory Visit: Payer: Self-pay | Admitting: Family Medicine

## 2023-11-20 DIAGNOSIS — G8929 Other chronic pain: Secondary | ICD-10-CM

## 2023-11-20 DIAGNOSIS — K921 Melena: Secondary | ICD-10-CM

## 2023-11-21 ENCOUNTER — Ambulatory Visit: Admitting: Internal Medicine

## 2023-11-21 DIAGNOSIS — H5000 Unspecified esotropia: Secondary | ICD-10-CM

## 2023-11-21 DIAGNOSIS — M797 Fibromyalgia: Secondary | ICD-10-CM

## 2023-11-21 DIAGNOSIS — E039 Hypothyroidism, unspecified: Secondary | ICD-10-CM

## 2023-11-21 DIAGNOSIS — R7982 Elevated C-reactive protein (CRP): Secondary | ICD-10-CM

## 2023-11-22 ENCOUNTER — Other Ambulatory Visit: Payer: Self-pay

## 2023-11-22 DIAGNOSIS — M810 Age-related osteoporosis without current pathological fracture: Secondary | ICD-10-CM

## 2023-11-23 ENCOUNTER — Ambulatory Visit (INDEPENDENT_AMBULATORY_CARE_PROVIDER_SITE_OTHER): Admitting: Pulmonary Disease

## 2023-11-23 ENCOUNTER — Encounter: Payer: Self-pay | Admitting: Pulmonary Disease

## 2023-11-23 VITALS — BP 94/64 | HR 93 | Temp 98.2°F | Ht 64.0 in | Wt 202.0 lb

## 2023-11-23 DIAGNOSIS — R42 Dizziness and giddiness: Secondary | ICD-10-CM

## 2023-11-23 DIAGNOSIS — Z77120 Contact with and (suspected) exposure to mold (toxic): Secondary | ICD-10-CM

## 2023-11-23 DIAGNOSIS — R0902 Hypoxemia: Secondary | ICD-10-CM

## 2023-11-23 DIAGNOSIS — R0609 Other forms of dyspnea: Secondary | ICD-10-CM

## 2023-11-23 DIAGNOSIS — Z77090 Contact with and (suspected) exposure to asbestos: Secondary | ICD-10-CM | POA: Diagnosis not present

## 2023-11-23 DIAGNOSIS — Z87891 Personal history of nicotine dependence: Secondary | ICD-10-CM

## 2023-11-23 DIAGNOSIS — R0600 Dyspnea, unspecified: Secondary | ICD-10-CM

## 2023-11-23 NOTE — Patient Instructions (Signed)
  VISIT SUMMARY: During your visit, we discussed your shortness of breath, episodes of cyanosis, and chronic dizziness. We reviewed your history of asbestos and chemical exposure, as well as your past smoking habits.  YOUR PLAN: CHRONIC DYSPNEA AND HYPOXEMIA: You have significant shortness of breath and low oxygen levels, especially during exertion, which sometimes causes your lips and fingertips to turn blue. -We have ordered a high-resolution CT scan of your chest without contrast to get a detailed look at your lungs. -We have also ordered lung function tests to assess how well your lungs are working. -A walking test was conducted to see how your oxygen levels change with activity.  CHRONIC DIZZINESS AND LIGHTHEADEDNESS: You experience chronic dizziness and lightheadedness, which may be related to your low oxygen levels and vision issues. -We evaluated your lung function and oxygen levels as part of managing your shortness of breath, which may help address your dizziness.  HISTORY OF EXPOSURE TO ASBESTOS AND TOXIC CHEMICALS: You have a significant history of exposure to asbestos and various chemicals, which increases your risk for lung conditions. -We are monitoring your lung health closely due to your history of exposure to harmful substances.  HISTORY OF BLACK MOLD EXPOSURE AND CHRONIC INFLAMMATORY RESPONSE SYNDROME: You were exposed to black mold about 15 years ago, which may have contributed to your current symptoms. -We are considering your past mold exposure as a factor in your symptoms and will continue to monitor your condition.

## 2023-11-23 NOTE — Progress Notes (Signed)
 Jodi Gilmore    [id number]    23-Jun-1955  Primary Care Physician:Baloch, Mahnoor, MD  Referring Physician: Jordan, Peter M, MD [address] [place],  [place] [address]  Chief complaint: Consult for dyspnea  HPI: 68 y.o. 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  Discussed the use of AI scribe software for clinical note transcription with the patient, who gave verbal consent to proceed.  History of Present Illness Jodi Gilmore is a 68 year old female with a history of asbestos exposure who presents with shortness of breath and cyanosis.  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 Significant  exposure to black mold in [place]  around 2008 No h/o chemo/XRT/amiodarone/macrodantin/MTX  No exposure to asbestos, silica or other organic allergens  Smoking history: 20-pack-year smoker.  Quit in 1990 Travel history: Previously lived in [place]  and [place] .  Moved to [place] around 2020 Family history: Mother had COPD.  She was a smoker   Outpatient Encounter Medications as of 11/23/2023  Medication Sig   alendronate  (FOSAMAX ) 10 MG tablet Take 1 tablet (10 mg total) by mouth daily before breakfast. Take with a full glass of water on an empty stomach.   amphetamine -dextroamphetamine  (ADDERALL) 10 MG tablet Take 1 tablet (10 mg total) by mouth 2 (two) times daily with a meal.   [START ON 12/04/2023] amphetamine -dextroamphetamine  (ADDERALL) 10 MG tablet Take 1 tablet (10 mg total) by mouth 2 (two) times daily with a meal.   atorvastatin  (LIPITOR) 40 MG tablet Take 1 tablet (40 mg total) by mouth daily.   busPIRone  (BUSPAR ) 10 MG tablet Take 1 tablet (10 mg total) by mouth 3 (three) times daily.   Calcium  Carb-Cholecalciferol  (CALCIUM  1000 + D) 1000-20 MG-MCG TABS Take 1 tablet by mouth daily.   cetirizine  (ZYRTEC  ALLERGY) 10 MG tablet Take 1 tablet (10 mg total) by mouth daily.   clotrimazole  (LOTRIMIN ) 1 % cream Apply 1 Application topically 2 (two) times daily. To right underarm   colchicine  0.6 MG tablet Take 1 tablet (0.6 mg total) by mouth daily.   cycloSPORINE  (RESTASIS ) 0.05 % ophthalmic emulsion Place 1 drop into both eyes 2 (two) times daily.   diclofenac  Sodium (VOLTAREN ) 1 % GEL Apply 2 g topically 4 (four) times daily. (Patient taking differently: Apply 2 g topically 4 (four) times daily. PRN)   FLUoxetine  (PROZAC ) 20 MG capsule Take 1 capsule (20 mg total) by mouth daily.   fluticasone  (FLONASE )  50 MCG/ACT nasal spray USE 2 SPRAYS IN EACH NOSTRIL ONCE TIME DAILY   levothyroxine  (SYNTHROID ) 175 MCG tablet TAKE ONE TABLET BY MOUTH EVERY MORNING BEFORE BREAKFAST    lidocaine  (XYLOCAINE ) 2 % solution Swish and spit 15mL no more than every 4 hours for oral ulcer pain. (Patient taking differently: Swish and spit 15mL no more than every 4 hours for oral ulcer pain.)   lisinopril  (ZESTRIL ) 2.5 MG tablet TAKE ONE TABLET BY MOUTH AT BEDTIME   methocarbamol  (ROBAXIN ) 500 MG tablet TAKE ONE TABLET BY MOUTH EVERY 8 HOURS AS NEEDED FOR MUSCLE SPASMS   omeprazole  (PRILOSEC) 40 MG capsule TAKE ONE CAPSULE BY MOUTH IN THE MORNING AND TAKE ONE CAPSULE BY MOUTH AT BEDTIME   ondansetron  (ZOFRAN ) 4 MG tablet TAKE ONE TABLET BY MOUTH EVERY 8 HOURS AS NEEDED FOR NAUSEA AND/OR VOMITING   PARoxetine  (PAXIL ) 20 MG tablet Take 1 tablet (20 mg total) by mouth daily for 7 days, THEN 0.5 tablets (10 mg total) daily for 14 days.   QUEtiapine  (SEROQUEL ) 400 MG tablet Take 1 tablet (400 mg total) by mouth at bedtime.   rizatriptan  (MAXALT ) 10 MG tablet Take 1 tablet (10 mg total) by mouth as needed for migraine. May repeat in 2 hours if needed. Do not take more than THREE doses in one day. Do not use more than 10 days per month.   rizatriptan  (MAXALT -MLT) 10 MG disintegrating tablet Take 1 tablet (10 mg total) by mouth as needed for migraine. May repeat in 2 hours if needed   valACYclovir  (VALTREX ) 500 MG tablet TAKE ONE TABLET BY MOUTH TWICE A DAY FOR 7 DAYS   cephALEXin (KEFLEX) 500 MG capsule Take 1 capsule (500 mg total) by mouth 2 (two) times daily. (Patient not taking: Reported on 11/23/2023)   No facility-administered encounter medications on file as of 11/23/2023.     Physical Exam:  .vitalsmbmi    Today's Vitals   11/23/23 1429  BP: 94/64  Pulse: 93  Temp: 98.2 F (36.8 C)  TempSrc: Oral  SpO2: 98%  Weight: 202 lb (91.6 kg)  Height: 5' 4 (1.626 m)   Body mass index is 34.67 kg/m.  Physical Exam GEN: No acute distress CV: Regular rate and rhythm no murmurs LUNGS: Clear to auscultation bilaterally normal respiratory effort SKIN JOINTS: Warm and dry no  rash    Data Reviewed: Imaging: CT chest 03/16/2022-subcentimeter pulmonary nodule measuring less than 3 mm.  No focal pulmonary consolidation Cardiac PET/CT 10/19/2023-visualized lungs look normal I have reviewed the images personally.  PFTs:  Labs:  Assessment and Plan Assessment & Plan Chronic dyspnea and hypoxemia Chronic dyspnea with hypoxemia, oxygen saturation often below 94%, dropping to 89% with exertion. Symptoms include cyanosis of lips and fingertips.  Unclear etiology as previous lung imaging was normal.  History of smoking, pneumonia, and family history of COPD and lung cancer. Previous CT scan in 2024 was normal. Recent cardiac MRI PET cardiac scan were normal. Differential includes pulmonary causes given history and symptoms.  Was checked for carbon monoxide levels in May 2025 and it was normal. - Ordered high-resolution CT of the chest without contrast - Ordered lung function tests - Conducted a walking test to assess oxygen desaturation  Chronic dizziness and lightheadedness Chronic dizziness and lightheadedness, possibly related to hypoxemia and vision issues. Neurological evaluation for Behcet's disease and other autoimmune conditions. No definitive diagnosis for dizziness. Symptoms may be exacerbated by vision problems and chronic pain. 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 Family history of mesothelioma and lung cancer.  History of black mold (Stachybotrys) exposure and chronic inflammatory response syndrome Exposure to black mold approximately 15 years ago, leading to chronic inflammatory response syndrome. No current mold exposure reported. Symptoms may be related to past exposure.  I personally spent a total of 60 minutes in the care of the patient today including preparing to  see the patient, getting/reviewing separately obtained history, performing a medically appropriate exam/evaluation, counseling and educating, independently interpreting results, and communicating results.   Recommendations: HRCT, PFTs  Lonna Coder MD [facility] 11/23/2023, 2:50 PM  CC: Jordan, Peter M, MD

## 2023-11-24 ENCOUNTER — Encounter

## 2023-11-27 ENCOUNTER — Encounter: Payer: Self-pay | Admitting: Pulmonary Disease

## 2023-11-28 NOTE — Telephone Encounter (Signed)
 Dr. Theophilus, The patient forgot to mention a few things during her visit with you   I have done some research and looked at timing of onset of extreme symptoms and wonder whetger my Seroquel  (400mg /day) could be causing, or exacerbating, a condition. Respiratory and breathing difficulties are listed as possible side effects. 2) An in home sleep study was ordered and I want to get that scheduled to find out what my sudden gasping is about.  (This was ordered by another physician office, not [facility]).  Please advise.  Thank you.

## 2023-12-01 ENCOUNTER — Other Ambulatory Visit: Payer: Self-pay | Admitting: Family Medicine

## 2023-12-01 DIAGNOSIS — G8929 Other chronic pain: Secondary | ICD-10-CM

## 2023-12-05 ENCOUNTER — Other Ambulatory Visit: Payer: Self-pay | Admitting: Family Medicine

## 2023-12-05 DIAGNOSIS — G43109 Migraine with aura, not intractable, without status migrainosus: Secondary | ICD-10-CM

## 2023-12-06 ENCOUNTER — Other Ambulatory Visit

## 2023-12-12 ENCOUNTER — Other Ambulatory Visit

## 2023-12-12 ENCOUNTER — Other Ambulatory Visit: Payer: Self-pay | Admitting: *Deleted

## 2023-12-12 DIAGNOSIS — M810 Age-related osteoporosis without current pathological fracture: Secondary | ICD-10-CM

## 2023-12-12 MED ORDER — CALCIUM 1000 + D 1000-20 MG-MCG PO TABS: 1.0000 | ORAL_TABLET | Freq: Every day | ORAL | 2 refills | Status: AC

## 2023-12-13 ENCOUNTER — Encounter: Payer: Self-pay | Admitting: Pulmonary Disease

## 2023-12-13 DIAGNOSIS — R0689 Other abnormalities of breathing: Secondary | ICD-10-CM

## 2023-12-14 ENCOUNTER — Other Ambulatory Visit

## 2023-12-15 ENCOUNTER — Telehealth: Payer: Self-pay

## 2023-12-15 NOTE — Telephone Encounter (Signed)
 Patient called stating her breathing issues was sleep related and she wants a sleep study done to investigate. Order for Hst placed per Dr Theophilus; pt was informed to keep apt and pft scheduled. Nfn

## 2023-12-16 ENCOUNTER — Telehealth (HOSPITAL_COMMUNITY): Admitting: Psychiatry

## 2023-12-17 ENCOUNTER — Other Ambulatory Visit: Payer: Self-pay | Admitting: Family Medicine

## 2023-12-17 DIAGNOSIS — G43109 Migraine with aura, not intractable, without status migrainosus: Secondary | ICD-10-CM

## 2023-12-19 ENCOUNTER — Other Ambulatory Visit (HOSPITAL_COMMUNITY): Payer: Self-pay | Admitting: Psychiatry

## 2023-12-19 DIAGNOSIS — F3160 Bipolar disorder, current episode mixed, unspecified: Secondary | ICD-10-CM

## 2023-12-19 NOTE — Progress Notes (Deleted)
 BH MD Outpatient Progress Note  Televisit via video: I connected with Jodi Gilmore on *** at  1:00 PM EST by a video enabled telemedicine application and verified that I am speaking with the correct person using two identifiers.  Location: Patient: *** Provider: ***   I discussed the limitations of evaluation and management by telemedicine and the availability of in person appointments. The patient expressed understanding and agreed to proceed.  I discussed the assessment and treatment plan with the patient. The patient was provided an opportunity to ask questions and all were answered. The patient agreed with the plan and demonstrated an understanding of the instructions.   The patient was advised to call back or seek an in-person evaluation if the symptoms worsen or if the condition fails to improve as anticipated.   Jodi Gilmore  MRN:  [id number]  Assessment:  Jodi Gilmore presents for follow-up evaluation. In the prior visit, we continued the cross titration of Paxil  to Prozac .   Identifying Information: Jodi Gilmore is a 68 y.o. y.o. female with a history of bipolar disorder, GAD, and ADHD who is an established patient with [facility] for management of anxiety and depression.   Plan:  # Bipolar disorder, GAD -- Continue Paxil  20 mg daily for one week -> 10 mg daily for 2 weeks -> stop (was on 40 mg then was decreased to 30 mg for 2 weeks) -- Prozac  20 mg daily - Continue buspirone  10 mg 3 times daily -- Continue Seroquel  400 mg at bedtime  -Labs updated on 07/2023  # ADHD -- Continue Adderall 10 mg BID  - PDMP checked  - Plan to obtain UDS from PCP  Patient was given contact information for behavioral health clinic and was instructed to call 911 for emergencies.   Subjective:   Interval History:  Patient seen ***.  Patient reports feeling *** today. Since the previous visit, ***. Stressors include ***.   Regarding psychiatric symptoms,  ***. Patient reports the medications are ***. Patient reports the following adverse effects: ***.   Patient reports *** sleep, ***. Patient reports *** appetite, ***.   Patient denies current SI, HI, and AVH. ***  Substance use: *** Denies tobacco, alcohol , and illicit substance   Past Psychiatric History:  Previous admit 40 years ago is [place]  for severe depression.  Patient denies prior suicide attempts or self-harm.  Paxil  since 2023 Prozac  (did like it back then)  Family Psychiatric History: none pertinent  Social history: Living: alone Occupation: denies Relationship: single Children: 1 child, son who lives in [place]  Past Medical History:  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    Hip pain    HLA B27 (HLA B27 positive)    Hyperlipidemia    Hypertension  Knee pain    Memory changes    Morphea scleroderma    Neuromuscular disorder (HCC)    Raynauds    Osteoporosis    Pneumonia    Psoriatic arthritis (HCC)    seen in ears only   Raynaud's disease    Sjogren syndrome with dental involvement     Past Surgical History:  Procedure Laterality Date   CYST REMOVAL HAND     left arm posterior   laproscopy     SALPINGECTOMY  1980   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   Procedure: LEFT TOTAL HIP ARTHROPLASTY ANTERIOR APPROACH;  Surgeon: Vernetta Lonni GRADE, MD;  Location: [facility];  Service: Orthopedics;  Laterality: Left;   tumor removed from  arm      Family History:  Family History  Problem Relation Age of Onset   Heart disease Mother    Heart attack Mother    Congestive Heart Failure Mother    Dementia Mother    Heart disease Father 87   Heart attack Father    Dementia Sister    Hemachromatosis Sister    Parkinson's disease Sister    Allergies Son    Colon cancer Neg Hx    Colon polyps Neg Hx    Esophageal cancer Neg Hx    Stomach cancer Neg Hx    Rectal cancer Neg Hx    BRCA 1/2 Neg Hx    Breast cancer Neg Hx     Social History:  Social History   Socioeconomic History   Marital status: Single    Spouse name: Not on file   Number of children: 1   Years of education: Not on file   Highest education level: Some college, no degree  Occupational History   Occupation: Unemployed  Tobacco Use   Smoking status: Former    Passive exposure: Past   Smokeless tobacco: Never  Vaping Use   Vaping status: Never Used  Substance and Sexual Activity   Alcohol  use: Not Currently   Drug use: Never   Sexual activity: Not Currently  Other Topics Concern   Not on file  Social History Narrative   Not on file   Social Drivers of Health   Financial Resource Strain: High Risk (10/28/2023)   Overall Financial Resource Strain (CARDIA)    Difficulty of Paying Living Expenses: Hard  Food Insecurity: Food Insecurity Present (11/17/2023)   Hunger Vital Sign    Worried About Running Out of Food in the Last Year: Sometimes true    Ran Out of Food in the Last Year: Sometimes true  Transportation Needs: Unmet Transportation Needs (11/17/2023)   PRAPARE - Transportation    Lack of Transportation (Medical): Yes    Lack of Transportation (Non-Medical): Yes  Physical Activity: Inactive (10/28/2023)   Exercise Vital Sign    Days of Exercise per Week: 0 days    Minutes of Exercise per Session: Not on file  Stress: Stress Concern Present (10/28/2023)   [employer] of Occupational Health - Occupational Stress Questionnaire     Feeling of Stress: Very much  Social Connections: Unknown (10/28/2023)   Social Connection and Isolation Panel    Frequency of Communication with Friends and Family: Once a week    Frequency of Social Gatherings with Friends and Family: Twice a week    Attends Religious Services: Never    Database Administrator or Organizations: No    Attends Engineer, Structural: Not on file    Marital Status: Patient  declined    Allergies:  Allergies  Allergen Reactions   Iodinated Contrast Media Anaphylaxis   Prohance [Gadoteridol] Anaphylaxis   Ioversol Other (See Comments)   Latex Hives and Rash   Ptu [Propylthiouracil] Other (See Comments)   Sulfa Antibiotics Hives and Other (See Comments)    Unknown reaction, family history   Tapazole [Methimazole] Other (See Comments)    Current Medications: Current Outpatient Medications  Medication Sig Dispense Refill   alendronate  (FOSAMAX ) 10 MG tablet Take 1 tablet (10 mg total) by mouth daily before breakfast. Take with a full glass of water on an empty stomach. 90 tablet 3   amphetamine -dextroamphetamine  (ADDERALL) 10 MG tablet Take 1 tablet (10 mg total) by mouth 2 (two) times daily with a meal. 60 tablet 0   amphetamine -dextroamphetamine  (ADDERALL) 10 MG tablet Take 1 tablet (10 mg total) by mouth 2 (two) times daily with a meal. 60 tablet 0   atorvastatin  (LIPITOR) 40 MG tablet Take 1 tablet (40 mg total) by mouth daily. 90 tablet 3   busPIRone  (BUSPAR ) 10 MG tablet Take 1 tablet (10 mg total) by mouth 3 (three) times daily. 90 tablet 1   Calcium  Carb-Cholecalciferol  (CALCIUM  1000 + D) 1000-20 MG-MCG TABS Take 1 tablet by mouth daily. 30 tablet 2   cephALEXin  (KEFLEX ) 500 MG capsule Take 1 capsule (500 mg total) by mouth 2 (two) times daily. (Patient not taking: Reported on 11/23/2023) 14 capsule 0   cetirizine  (ZYRTEC  ALLERGY) 10 MG tablet Take 1 tablet (10 mg total) by mouth daily. 90 tablet 1   clotrimazole  (LOTRIMIN ) 1 % cream  Apply 1 Application topically 2 (two) times daily. To right underarm 30 g 0   colchicine  0.6 MG tablet Take 1 tablet (0.6 mg total) by mouth daily. 90 tablet 3   cycloSPORINE  (RESTASIS ) 0.05 % ophthalmic emulsion Place 1 drop into both eyes 2 (two) times daily. 60 each 3   diclofenac  Sodium (VOLTAREN ) 1 % GEL Apply 2 g topically 4 (four) times daily. (Patient taking differently: Apply 2 g topically 4 (four) times daily. PRN) 20 g 1   FLUoxetine  (PROZAC ) 20 MG capsule Take 1 capsule (20 mg total) by mouth daily. 30 capsule 1   fluticasone  (FLONASE ) 50 MCG/ACT nasal spray USE 2 SPRAYS IN EACH NOSTRIL ONCE TIME DAILY 16 mL 6   levothyroxine  (SYNTHROID ) 175 MCG tablet TAKE ONE TABLET BY MOUTH EVERY MORNING BEFORE BREAKFAST 90 tablet 1   lidocaine  (XYLOCAINE ) 2 % solution Swish and spit 15mL no more than every 4 hours for oral ulcer pain. (Patient taking differently: Swish and spit 15mL no more than every 4 hours for oral ulcer pain.) 100 mL 3   lisinopril  (ZESTRIL ) 2.5 MG tablet TAKE ONE TABLET BY MOUTH AT BEDTIME 90 tablet 1   methocarbamol  (ROBAXIN ) 500 MG tablet TAKE ONE TABLET BY MOUTH EVERY 8 HOURS AS NEEDED FOR MUSCLE SPASMS 20 tablet 0   omeprazole  (PRILOSEC) 40 MG capsule TAKE ONE CAPSULE BY MOUTH IN THE MORNING AND TAKE ONE CAPSULE BY MOUTH AT BEDTIME 180 capsule 3   ondansetron  (ZOFRAN ) 4 MG tablet TAKE ONE TABLET BY MOUTH EVERY 8 HOURS AS NEEDED FOR NAUSEA AND/OR VOMITING 20 tablet 0   PARoxetine  (PAXIL ) 20 MG tablet Take 1 tablet (20 mg total) by mouth daily for 7 days, THEN 0.5 tablets (10 mg total) daily for 14 days. 14 tablet 0   QUEtiapine  (SEROQUEL ) 400 MG tablet Take 1 tablet (400 mg total) by mouth at bedtime. 60 tablet 0  rizatriptan  (MAXALT ) 10 MG tablet Take 1 tablet (10 mg total) by mouth as needed for migraine. May repeat in 2 hours if needed. Do not take more than THREE doses in one day. Do not use more than 10 days per month. 30 tablet 2   rizatriptan  (MAXALT -MLT) 10 MG  disintegrating tablet TAKE 1 TABLET (10 MG TOTAL) BY MOUTH AS NEEDED FOR MIGRAINE. MAY REPEAT IN 2 HOURS IF NEEDED 10 tablet 0   valACYclovir  (VALTREX ) 500 MG tablet TAKE ONE TABLET BY MOUTH TWICE A DAY FOR 7 DAYS 30 tablet 2   No current facility-administered medications for this visit.     Objective: Psychiatric Specialty Exam: General Appearance: appears at stated age, casually dressed and groomed ***  Behavior: pleasant and cooperative ***  Psychomotor Activity: no psychomotor agitation or retardation noted ***  Eye Contact: fair *** Speech: normal amount, volume and fluency ***   Mood: euthymic *** Affect: congruent, pleasant and interactive ***  Thought Process: linear, goal directed, no circumstantial or tangential thought process noted, no racing thoughts or flight of ideas *** Descriptions of Associations: intact ***  Thought Content Hallucinations: denies AH, VH , does not appear responding to stimuli *** Delusions: no paranoia, delusions of control, grandeur, ideas of reference, thought broadcasting, and magical thinking *** Suicidal Thoughts: denies SI, intention, plan *** Homicidal Thoughts: denies HI, intention, plan ***  Alertness/Orientation: alert and fully oriented ***  Insight: fair*** Judgment: fair***  Memory: intact ***  Executive Functions  Concentration: intact *** Attention Span: fair *** Recall: intact *** Fund of Knowledge: fair ***  Physical Exam *** General: Pleasant, well-appearing. No acute distress. Pulmonary: Normal effort. No wheezing or rales. Skin: No obvious rash or lesions.*** Neuro: A&Ox3.No focal deficit.  Review of Systems *** No reported symptoms   Metabolic Disorder Labs: Lab Results  Component Value Date   HGBA1C 5.3 08/10/2023   No results found for: PROLACTIN Lab Results  Component Value Date   CHOL 167 05/24/2023   TRIG 232 (H) 05/24/2023   HDL 48 05/24/2023   CHOLHDL 3.5 05/24/2023   LDLCALC 81  05/24/2023   LDLCALC 175 (H) 02/12/2020   Lab Results  Component Value Date   TSH 1.220 11/01/2023   TSH 13.000 (H) 08/10/2023    Therapeutic Level Labs: No results found for: LITHIUM No results found for: VALPROATE No results found for: CBMZ  Screenings: GAD-7    Flowsheet Row Patient Outreach Telephone from 11/17/2023 in [place] [facility]PART[facility] Patient Outreach Telephone from 10/14/2023 in [place] [facility] POPULATION HEALTH DEPARTMENT Video Visit from 08/26/2021 in [facility] Video Visit from 06/24/20[facility] in [facility] Video Visit from 04/22/2021 in [facility]  Total GAD-7 Score 6 6 9 10 14    PHQ2-9    Flowsheet Row Patient Outreach Telephone from 11/17/2023 in [place] [facility] POPULATION HEALTH DEPARTMENT Office Visit from 11/01/2023 in [facility] - [facility] Dept Of [place] Patient Outreach Telephone from 10/14/2023 in [place] POPULATION HEALTH DEPARTMENT Office Visit from 05/24/2023 in [facility] - A Dept Of [place]. [facility] Office Visit from 03/22/2023 in [facility] - A Dept Of [place]. [facility]  PHQ-2 Total Score 2 4 0 4 5  PHQ-9 Total Score 7 14 -- 12 22   Flowsheet Row UC from 12/11/2022 in [facility]) Video Visit  from 08/26/2021 in [facility] Video Visit from 06/24/2021 in [facility]  C-SSRS RISK CATEGORY No Risk Low Risk Low Risk    [name], MD PGY-3 Psychiatry Resident

## 2023-12-21 ENCOUNTER — Other Ambulatory Visit: Payer: Self-pay | Admitting: Family Medicine

## 2023-12-21 ENCOUNTER — Inpatient Hospital Stay: Admission: RE | Admit: 2023-12-21 | Discharge: 2023-12-21 | Attending: Pulmonary Disease

## 2023-12-21 DIAGNOSIS — R110 Nausea: Secondary | ICD-10-CM

## 2023-12-21 DIAGNOSIS — R0600 Dyspnea, unspecified: Secondary | ICD-10-CM

## 2023-12-22 ENCOUNTER — Other Ambulatory Visit: Payer: Self-pay | Admitting: *Deleted

## 2023-12-22 ENCOUNTER — Other Ambulatory Visit: Payer: Self-pay

## 2023-12-22 NOTE — Patient Outreach (Signed)
 Complex Care Management   Visit Note  12/22/2023  Name:  Jodi Gilmore MRN: [id number] DOB: January 31, 1955  Situation: Referral received for Complex Care Management related to HTN I obtained verbal consent from Patient.  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    Hip pain    HLA B27 (HLA B27 positive)    Hyperlipidemia    Hypertension    Knee pain    Memory changes    Morphea scleroderma    Neuromuscular disorder (HCC)    Raynauds    Osteoporosis    Pneumonia    Psoriatic arthritis (HCC)    seen in ears only   Raynaud's disease    Sjogren syndrome with dental involvement     Assessment: Patient Reported Symptoms:  Cognitive Cognitive Status: Able to follow simple commands, Alert and oriented to person, place, and time, Insightful and able to interpret abstract concepts, Normal speech and language skills Cognitive/Intellectual Conditions Management [RPT]: None reported or documented in medical history or problem list   Health Maintenance Behaviors: Annual physical exam, Sleep adequate, Stress management, Healthy diet Healing Pattern: Average Health Facilitated by: Healthy diet, Pain control, Prayer/meditation, Stress management, Rest  Neurological Neurological Review of Symptoms: Dizziness, Headaches Neurological Management Strategies: Adequate rest, Routine screening Neurological Comment: Patient reports that she  has occassional dizzness due to her chronic migraines.  Patient takes rizatriptan  for chronic migraines.  HEENT HEENT Symptoms Reported: Other: HEENT Comment: Patient has PMH of Bechcet's disease.  She reports that she also has an outbreak of genital sores in her vaginal area.  She reports that her the ulcers in her mouth are much better.  She uses Xylocaine  and Valtrex  and Colchicine  to help with the genital sores and ulcers in her mouth.  Patient also reports that she has a history of Herpes Simplex virus.    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  Respiratory Respiratory Symptoms Reported: Shortness of breath Other Respiratory Symptoms: Patient reports that she had been having some issues with shortness of breath.  She informs me that she recently when to Pulmononlogist.  She reports that she was placed on 5L of 02.  She reports that her shortness of breath has gotten some what better with the o2.  She reports that she wears the 02 as needed with excertion.  She reports that Pulmononlogist is also completing further testing to due t her hx of Asbestos exposure, She informs me that her 02 sats have been above 96 % with oxygen support. Respiratory Management Strategies:  Adequate rest, Oxygen therapy, Coping strategies, Routine screening Respiratory Self-Management Outcome: 3 (uncertain)  Endocrine Endocrine Symptoms Reported: No symptoms reported Is patient diabetic?: No Endocrine Self-Management Outcome: 4 (good)  Gastrointestinal Gastrointestinal Symptoms Reported: Constipation Additional Gastrointestinal Details: Patient reports occassional constipation.  She reports that she takes Miralax to help with  Consitipation. Gastrointestinal Management Strategies: Adequate rest, Diet modification, Coping strategies Gastrointestinal Self-Management Outcome: 3 (uncertain)    Genitourinary Genitourinary Symptoms Reported: Incontinence, Frequency Additional Genitourinary Details: Patient reports that she has periods of incontinence.  Patient reports that she wears diapers to help with incontinence.  Patient has a hx of bladder infections.  Patient informed me that she recenlty was treated for a UTI. Genitourinary Management Strategies: Adequate rest Genitourinary Self-Management Outcome: 3 (uncertain) Genitourinary Comment: Patient has lesions in her private area due to Behcets disease and hx of Herpes Simplex  Integumentary Integumentary Symptoms Reported: Other Other Integumentary Symptoms: Patient has lesions in her mouth due to Behcets and Herpes Simplex Skin Management Strategies: Adequate rest, Medication therapy, Coping strategies Skin Self-Management Outcome: 3 (uncertain)  Musculoskeletal Musculoskelatal Symptoms Reviewed: Back pain, Difficulty walking, Limited mobility Additional Musculoskeletal Details: Patient is able to get around with a walker.  Patient report that she has chronic back pain.  Patient reports that she also has chronic neck pain.  Patient reports that she is followed by Orthopedic and she receives injections to her neck and back area every 3 months.  Patient reports that she takes Tylenol  and Motrin to help with Musculoskeletal Management Strategies: Adequate rest, Coping strategies, Routine screening, Medication therapy Musculoskeletal Self-Management Outcome: 3 (uncertain) Falls in the past year?: No Number of falls in past year: 1 or less Patient at Risk for Falls Due to: No Fall Risks  Psychosocial Psychosocial Symptoms Reported: Depression - if selected complete PHQ 2-9, Anxiety - if selected complete GAD Additional Psychological Details: Patient is active with  [facility] Behavioral Management Strategies: Coping strategies, Counseling, Adequate rest, Medication therapy Behavioral Health Self-Management Outcome: 3 (uncertain) Major Change/Loss/Stressor/Fears (CP): Medical condition, self Techniques to Cope with Loss/Stress/Change: Counseling Quality of Family Relationships: helpful, involved, supportive Do you feel physically threatened by others?: No    12/22/2023    PHQ2-9 Depression Screening   Little interest or pleasure in doing things Several days  Feeling down, depressed, or hopeless Several days  PHQ-2 - Total Score 2  Trouble falling or staying asleep, or sleeping too much Several days  Feeling tired or having little energy Nearly every day  Poor appetite or overeating  Not at all  Feeling bad about yourself - or that you are a failure or have let yourself or your family down Not at all  Trouble concentrating on things, such as reading the newspaper or watching television Nearly every day  Moving or speaking so slowly that other people could have noticed.  Or the opposite - being so fidgety or restless that you have been moving around a lot more than usual Not at all  Thoughts that you would be better off dead, or hurting yourself in some way Not at all  PHQ2-9 Total Score 9  If you checked off any problems, how difficult have these problems made it for you to do your work, take care of things at home, or get along with other people    Depression Interventions/Treatment Currently on Treatment, Counseling, Medication    There were no vitals filed for this visit. Pain Scale: 0-10 Pain Score: 9  Pain Type: Chronic pain Pain Location:  Back Pain Orientation: Lower Pain Descriptors / Indicators: Aching, Discomfort Pain Onset: On-going Patients Stated Pain Goal: 9 Pain Intervention(s): Medication (See eMAR), Relaxation, Repositioned, Rest, Other (Comment) (Followed by Orthopedic)  Medications Reviewed Today     Reviewed by Jorja Nichole LABOR,  RN (Case Manager) on 12/22/23 at 1236  Med List Status: <None>   Medication Order Taking? Sig Documenting Provider Last Dose Status Informant  alendronate  (FOSAMAX ) 10 MG tablet [id number] Yes Take 1 tablet (10 mg total) by mouth daily before breakfast. Take with a full glass of water on an empty stomach. Gomes, Adriana, DO  Active   amphetamine -dextroamphetamine  (ADDERALL) 10 MG tablet [id number] Yes Take 1 tablet (10 mg total) by mouth 2 (two) times daily with a meal. Izella Ismael NOVAK, MD  Active   amphetamine -dextroamphetamine  (ADDERALL) 10 MG tablet [id number] Yes Take 1 tablet (10 mg total) by mouth 2 (two) times daily with a meal. Izella Ismael NOVAK, MD  Active   atorvastatin  (LIPITOR) 40 MG tablet [id number] Yes Take 1 tablet (40 mg total) by mouth daily. Gomes, Adriana, DO  Active   busPIRone  (BUSPAR ) 10 MG tablet [id number] Yes Take 1 tablet (10 mg total) by mouth 3 (three) times daily. Izella Ismael NOVAK, MD  Active   Calcium  Carb-Cholecalciferol  (CALCIUM  1000 + D) 1000-20 MG-MCG TABS [id number] Yes Take 1 tablet by mouth daily. Baloch, Mahnoor, MD  Active   cephALEXin  (KEFLEX ) 500 MG capsule [id number]  Take 1 capsule (500 mg total) by mouth 2 (two) times daily.  Patient not taking: Reported on 12/22/2023   Adele Song, MD  Active   cetirizine  (ZYRTEC  ALLERGY) 10 MG tablet [id number] Yes Take 1 tablet (10 mg total) by mouth daily. Simmons-Robinson, Makiera, MD  Active Self  clotrimazole  (LOTRIMIN ) 1 % cream [id number] Yes Apply 1 Application topically 2 (two) times daily. To right underarm Baloch, Mahnoor, MD  Active   colchicine  0.6 MG tablet [id number] Yes Take 1 tablet (0.6 mg total) by mouth daily. Marlee Lynwood NOVAK, MD  Active   cycloSPORINE  (RESTASIS ) 0.05 % ophthalmic emulsion [id number] Yes INSTILL ONE DROP INTO EACH EYE TWICE DAILY Baloch, Mahnoor, MD  Active   diclofenac  Sodium (VOLTAREN ) 1 % GEL [id number] Yes Apply 2 g topically 4 (four) times daily.  Patient taking differently: Apply 2 g  topically 4 (four) times daily. PRN   Baloch, Mahnoor, MD  Active   FLUoxetine  (PROZAC ) 20 MG capsule [id number] Yes TAKE ONE CAPSULE BY MOUTH ONE TIME DAILY Carrion-Carrero, Margely, MD  Active   fluticasone  (FLONASE ) 50 MCG/ACT nasal spray [id number] Yes USE 2 SPRAYS IN EACH NOSTRIL ONCE TIME DAILY Baloch, Mahnoor, MD  Active   levothyroxine  (SYNTHROID ) 175 MCG tablet [id number] Yes TAKE ONE TABLET BY MOUTH EVERY MORNING BEFORE BREAKFAST Baloch, Mahnoor, MD  Active   lidocaine  (XYLOCAINE ) 2 % solution [id number] Yes Swish and spit 15mL no more than every 4 hours for oral ulcer pain. Marlee Lynwood NOVAK, MD  Active   lisinopril  (ZESTRIL ) 2.5 MG tablet [id number] Yes TAKE ONE TABLET BY MOUTH AT BEDTIME Baloch, Mahnoor, MD  Active   methocarbamol  (ROBAXIN ) 500 MG tablet [id number] Yes TAKE ONE TABLET BY MOUTH EVERY 8 HOURS AS NEEDED FOR MUSCLE SPASMS Baloch, Mahnoor, MD  Active   omeprazole  (PRILOSEC) 40 MG capsule [id number] Yes TAKE ONE CAPSULE BY MOUTH IN THE MORNING AND TAKE ONE CAPSULE BY MOUTH AT BEDTIME Baloch, Mahnoor, MD  Active   ondansetron  (ZOFRAN ) 4 MG tablet [id number] Yes TAKE ONE TABLET BY MOUTH EVERY 8 HOURS  AS NEEDED FOR NAUSEA AND/OR VOMITING Baloch, Mahnoor, MD  Active   PARoxetine  (PAXIL ) 20 MG tablet [id number]  Take 1 tablet (20 mg total) by mouth daily for 7 days, THEN 0.5 tablets (10 mg total) daily for 14 days.  Patient not taking: No sig reported   Izella Ismael NOVAK, MD  Expired 11/24/23 2359   QUEtiapine  (SEROQUEL ) 400 MG tablet [id number] Yes Take 1 tablet (400 mg total) by mouth at bedtime. Izella Ismael NOVAK, MD  Active   rizatriptan  (MAXALT ) 10 MG tablet [id number] Yes Take 1 tablet (10 mg total) by mouth as needed for migraine. May repeat in 2 hours if needed. Do not take more than THREE doses in one day. Do not use more than 10 days per month. Diona Perkins, MD  Active   rizatriptan  (MAXALT -MLT) 10 MG disintegrating tablet [id number] Yes TAKE 1 TABLET (10 MG TOTAL) BY MOUTH AS NEEDED FOR  MIGRAINE. MAY REPEAT IN 2 HOURS IF NEEDED Baloch, Mahnoor, MD  Active   valACYclovir  (VALTREX ) 500 MG tablet [id number] Yes TAKE ONE TABLET BY MOUTH TWICE A DAY FOR 7 DAYS Marlee Lynwood NOVAK, MD  Active             Recommendation:   PCP Follow-up Specialty provider follow-up : [facility]-12/26/23; Pulmonology-02/01/24; Neurology-03/05/24 Continue Current Plan of Care  Follow Up Plan:   Telephone follow-up in 1 month: 02/02/24 @ 11:30 am   Geselle Cardosa, RN, BSN, ACM RN Care Manager [employer] [phone]

## 2023-12-22 NOTE — Patient Instructions (Signed)
 Visit Information  Thank you for taking time to visit with me today. Please don't hesitate to contact me if I can be of assistance to you before our next scheduled appointment.  Your next care management appointment is by telephone on 02/02/24 at 11:30 am  Telephone follow-up in 1 month: 02/02/24 @ 1130 am.   Please call the care guide team at [phone] if you need to cancel, schedule, or reschedule an appointment.   Please call the Suicide and Crisis Lifeline: 988 call the [place]  National Suicide Prevention Lifeline: 936-376-1324 or TTY: 225-779-1089 TTY (234)642-7222) to talk to a trained counselor call 1-800-273-TALK (toll free, 24 hour hotline) go to [facility] [address] [place] [phone]) call 911 if you are experiencing a Mental Health or Behavioral Health Crisis or need someone to talk to.  Lynx Goodrich, RN, BSN, Theatre Manager [employer] [phone]

## 2023-12-26 ENCOUNTER — Telehealth (HOSPITAL_COMMUNITY): Admitting: Psychiatry

## 2023-12-26 ENCOUNTER — Other Ambulatory Visit (HOSPITAL_COMMUNITY): Payer: Self-pay | Admitting: Psychiatry

## 2023-12-26 ENCOUNTER — Telehealth (HOSPITAL_COMMUNITY): Payer: Self-pay

## 2023-12-26 DIAGNOSIS — F3160 Bipolar disorder, current episode mixed, unspecified: Secondary | ICD-10-CM

## 2023-12-26 MED ORDER — QUETIAPINE FUMARATE 400 MG PO TABS: 400.0000 mg | ORAL_TABLET | Freq: Every day | ORAL | 0 refills | Status: DC

## 2023-12-26 NOTE — Telephone Encounter (Signed)
 Refill request faxed from Patient's pharmacy for QUEtiapine  (SEROQUEL ) 400 MG tablet.  Patient next appointment 01/30/2024. Patient is seen at [facility] office. Fax was sent to [facility]

## 2024-01-02 ENCOUNTER — Ambulatory Visit

## 2024-01-02 VITALS — Wt 203.0 lb

## 2024-01-02 DIAGNOSIS — Z0000 Encounter for general adult medical examination without abnormal findings: Secondary | ICD-10-CM | POA: Diagnosis not present

## 2024-01-02 NOTE — Patient Instructions (Signed)
 Jodi Gilmore,  Thank you for taking the time for your Medicare Wellness Visit. I appreciate your continued commitment to your health goals. Please review the care plan we discussed, and feel free to reach out if I can assist you further.  Please note that Annual Wellness Visits do not include a physical exam. Some assessments may be limited, especially if the visit was conducted virtually. If needed, we may recommend an in-person follow-up with your provider.  Ongoing Care Seeing your primary care provider every 3 to 6 months helps us  monitor your health and provide consistent, personalized care.   Referrals If a referral was made during today's visit and you haven't received any updates within two weeks, please contact the referred provider directly to check on the status.  Recommended Screenings:  Health Maintenance  Topic Date Due   Zoster (Shingles) Vaccine (1 of 2) Never done   Medicare Annual Wellness Visit  03/10/2023   Flu Shot  08/12/2023   COVID-19 Vaccine (4 - 2025-26 season) 09/12/2023   DTaP/Tdap/Td vaccine (1 - Tdap) 01/18/2024*   Breast Cancer Screening  02/07/2025   Colon Cancer Screening  04/05/2030   Pneumococcal Vaccine for age over 73  Completed   Osteoporosis screening with Bone Density Scan  Completed   Hepatitis C Screening  Completed   Meningitis B Vaccine  Aged Out  *Topic was postponed. The date shown is not the original due date.       01/02/2024    9:17 AM  Advanced Directives  Does Patient Have a Medical Advance Directive? No  Would patient like information on creating a medical advance directive? No - Patient declined    Vision: Annual vision screenings are recommended for early detection of glaucoma, cataracts, and diabetic retinopathy. These exams can also reveal signs of chronic conditions such as diabetes and high blood pressure.  Dental: Annual dental screenings help detect early signs of oral cancer, gum disease, and other conditions linked to  overall health, including heart disease and diabetes.  Please see the attached documents for additional preventive care recommendations.

## 2024-01-02 NOTE — Progress Notes (Addendum)
 "  Chief Complaint  Patient presents with   Medicare Wellness    SUBSEQUENT     Subjective:   Jodi Gilmore is a 68 y.o. female who presents for a Medicare Annual Wellness Visit.  Visit info / Clinical Intake: Medicare Wellness Visit Type:: Subsequent Annual Wellness Visit Persons participating in visit and providing information:: patient Medicare Wellness Visit Mode:: Telephone If telephone:: video declined Since this visit was completed virtually, some vitals may be partially provided or unavailable. Missing vitals are due to the limitations of the virtual format.: Documented vitals are patient reported If Telephone or Video please confirm:: I connected with patient using audio/video enable telemedicine. I verified patient identity with two identifiers, discussed telehealth limitations, and patient agreed to proceed. Patient Location:: HOME Provider Location:: HOME OFFICE Interpreter Needed?: No Pre-visit prep was completed: yes AWV questionnaire completed by patient prior to visit?: yes Date:: 12/29/23 Living arrangements:: (!) lives alone Patient's Overall Health Status Rating: good Typical amount of pain: (!) a lot Does pain affect daily life?: (!) yes Are you currently prescribed opioids?: no  Dietary Habits and Nutritional Risks How many meals a day?: 4 Eats fruit and vegetables daily?: yes Most meals are obtained by: preparing own meals In the last 2 weeks, have you had any of the following?: none Diabetic:: no  Functional Status Activities of Daily Living (to include ambulation/medication): Independent Ambulation: Independent with device- listed below Medication Administration: Independent Home Management (perform basic housework or laundry): Independent Manage your own finances?: yes Primary transportation is: facility / other Concerns about vision?: (!) yes (Diagnosed with Binocular Diplopia; wears eyeglasses) Concerns about hearing?: no  Fall  Screening Falls in the past year?: 1 Number of falls in past year: 1 Was there an injury with Fall?: 0 Fall Risk Category Calculator: 2 Patient Fall Risk Level: Moderate Fall Risk  Fall Risk Patient at Risk for Falls Due to: Impaired vision; Orthopedic patient Fall risk Follow up: Falls evaluation completed; Education provided  Home and Transportation Safety: All rugs have non-skid backing?: yes All stairs or steps have railings?: (!) no Grab bars in the bathtub or shower?: (!) no Have non-skid surface in bathtub or shower?: yes Good home lighting?: yes Regular seat belt use?: yes Hospital stays in the last year:: no  Cognitive Assessment Difficulty concentrating, remembering, or making decisions? : yes Will 6CIT or Mini Cog be Completed: yes What year is it?: 0 points What month is it?: 0 points Give patient an address phrase to remember (5 components): [address] [place] About what time is it?: 0 points Count backwards from 20 to 1: 0 points Say the months of the year in reverse: 0 points Repeat the address phrase from earlier: 0 points 6 CIT Score: 0 points  Advance Directives (For Healthcare) Does Patient Have a Medical Advance Directive?: No Would patient like information on creating a medical advance directive?: No - Patient declined  Reviewed/Updated  Reviewed/Updated: Reviewed All (Medical, Surgical, Family, Medications, Allergies, Care Teams, Patient Goals)    Allergies (verified) Iodinated contrast media, Prohance [gadoteridol], Ioversol, Latex, Ptu [propylthiouracil], Sulfa antibiotics, and Tapazole [methimazole]   Current Medications (verified) Outpatient Encounter Medications as of 01/02/2024  Medication Sig   alendronate  (FOSAMAX ) 10 MG tablet Take 1 tablet (10 mg total) by mouth daily before breakfast. Take with a full glass of water on an empty stomach.   amphetamine -dextroamphetamine  (ADDERALL) 10 MG tablet Take 1 tablet (10 mg total) by  mouth 2 (two) times daily with  a meal.   amphetamine -dextroamphetamine  (ADDERALL) 10 MG tablet Take 1 tablet (10 mg total) by mouth 2 (two) times daily with a meal.   atorvastatin  (LIPITOR) 40 MG tablet Take 1 tablet (40 mg total) by mouth daily.   busPIRone  (BUSPAR ) 10 MG tablet Take 1 tablet (10 mg total) by mouth 3 (three) times daily.   Calcium  Carb-Cholecalciferol  (CALCIUM  1000 + D) 1000-20 MG-MCG TABS Take 1 tablet by mouth daily.   cephALEXin  (KEFLEX ) 500 MG capsule Take 1 capsule (500 mg total) by mouth 2 (two) times daily. (Patient not taking: Reported on 12/22/2023)   cetirizine  (ZYRTEC  ALLERGY) 10 MG tablet Take 1 tablet (10 mg total) by mouth daily.   clotrimazole  (LOTRIMIN ) 1 % cream Apply 1 Application topically 2 (two) times daily. To right underarm   colchicine  0.6 MG tablet Take 1 tablet (0.6 mg total) by mouth daily.   cycloSPORINE  (RESTASIS ) 0.05 % ophthalmic emulsion INSTILL ONE DROP INTO EACH EYE TWICE DAILY   diclofenac  Sodium (VOLTAREN ) 1 % GEL Apply 2 g topically 4 (four) times daily. (Patient taking differently: Apply 2 g topically 4 (four) times daily. PRN)   FLUoxetine  (PROZAC ) 20 MG capsule TAKE ONE CAPSULE BY MOUTH ONE TIME DAILY   fluticasone  (FLONASE ) 50 MCG/ACT nasal spray USE 2 SPRAYS IN EACH NOSTRIL ONCE TIME DAILY   levothyroxine  (SYNTHROID ) 175 MCG tablet TAKE ONE TABLET BY MOUTH EVERY MORNING BEFORE BREAKFAST   lidocaine  (XYLOCAINE ) 2 % solution Swish and spit 15mL no more than every 4 hours for oral ulcer pain.   lisinopril  (ZESTRIL ) 2.5 MG tablet TAKE ONE TABLET BY MOUTH AT BEDTIME   methocarbamol  (ROBAXIN ) 500 MG tablet TAKE ONE TABLET BY MOUTH EVERY 8 HOURS AS NEEDED FOR MUSCLE SPASMS   omeprazole  (PRILOSEC) 40 MG capsule TAKE ONE CAPSULE BY MOUTH IN THE MORNING AND TAKE ONE CAPSULE BY MOUTH AT BEDTIME   ondansetron  (ZOFRAN ) 4 MG tablet TAKE ONE TABLET BY MOUTH EVERY 8 HOURS AS NEEDED FOR NAUSEA AND/OR VOMITING   OXYGEN Inhale 5 L into the lungs as needed  (as needed with exceration).   PARoxetine  (PAXIL ) 20 MG tablet Take 1 tablet (20 mg total) by mouth daily for 7 days, THEN 0.5 tablets (10 mg total) daily for 14 days. (Patient not taking: No sig reported)   QUEtiapine  (SEROQUEL ) 400 MG tablet Take 1 tablet (400 mg total) by mouth at bedtime.   rizatriptan  (MAXALT ) 10 MG tablet Take 1 tablet (10 mg total) by mouth as needed for migraine. May repeat in 2 hours if needed. Do not take more than THREE doses in one day. Do not use more than 10 days per month.   rizatriptan  (MAXALT -MLT) 10 MG disintegrating tablet TAKE 1 TABLET (10 MG TOTAL) BY MOUTH AS NEEDED FOR MIGRAINE. MAY REPEAT IN 2 HOURS IF NEEDED   valACYclovir  (VALTREX ) 500 MG tablet TAKE ONE TABLET BY MOUTH TWICE A DAY FOR 7 DAYS   No facility-administered encounter medications on file as of 01/02/2024.    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    Hip pain    HLA B27 (HLA B27 positive)    Hyperlipidemia    Hypertension    Knee pain    Memory changes    Morphea scleroderma    Neuromuscular disorder (HCC)    Raynauds    Osteoporosis    Pneumonia    Psoriatic arthritis (HCC)    seen in ears only   Raynaud's disease    Sjogren syndrome with dental involvement    Past Surgical History:  Procedure Laterality Date   CYST REMOVAL HAND     left arm posterior   laproscopy     SALPINGECTOMY  1980   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   Procedure: LEFT TOTAL HIP  ARTHROPLASTY ANTERIOR APPROACH;  Surgeon: Vernetta Lonni GRADE, MD;  Location: [facility];  Service: Orthopedics;  Laterality: Left;   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     Family History  Problem Relation Age of Onset   Heart disease Mother    Heart attack Mother    Congestive Heart Failure Mother    Dementia Mother    Heart disease Father 23   Heart attack Father    Dementia Sister    Hemachromatosis Sister    Parkinson's disease Sister    Allergies Son    Colon cancer Neg Hx    Colon polyps Neg Hx    Esophageal cancer Neg Hx    Stomach cancer Neg Hx    Rectal cancer Neg Hx    BRCA 1/2 Neg Hx    Breast cancer Neg Hx    Social History   Occupational History   Occupation: Unemployed  Tobacco Use   Smoking status: Former    Passive exposure: Past   Smokeless tobacco: Never  Vaping Use   Vaping status: Never Used  Substance and Sexual Activity   Alcohol  use: Not Currently   Drug use: Never   Sexual activity: Not Currently   Tobacco Counseling Counseling given: Not Answered  SDOH Screenings   Food Insecurity: Food Insecurity Present (01/02/2024)  Housing: Low Risk (01/02/2024)  Recent Concern: Housing - High Risk (10/14/2023)  Transportation Needs: Unmet Transportation Needs (01/02/2024)  Utilities: Not At Risk (01/02/2024)  Alcohol  Screen: Low Risk (01/02/2024)  Depression (PHQ2-9): Medium Risk (01/02/2024)  Financial Resource Strain: High Risk (01/02/2024)  Physical Activity: Inactive (01/02/2024)  Social Connections: Unknown (01/02/2024)  Stress: Stress Concern Present (01/02/2024)  Tobacco Use: Medium Risk (01/02/2024)  Health Literacy: Adequate Health Literacy (01/02/2024)   See flowsheets for full screening details  Depression Screen Depression Screening Exception Documentation Depression Screening Exception:: -- (unable to access patient is not able to talke long periods of time.)  PHQ 2 & 9 Depression Scale- Over the past 2 weeks, how often have you been  bothered by any of the following problems? Little interest or pleasure in doing things: 1 Feeling down, depressed, or hopeless (PHQ Adolescent also includes...irritable): 1 PHQ-2 Total Score: 2 Trouble falling or staying asleep, or sleeping too much: 1 Feeling tired or having little energy: 3 Poor appetite or overeating (PHQ Adolescent also includes...weight loss): 0 Feeling bad about yourself - or that you are a failure or have let yourself or your family down: 0 Trouble concentrating on things, such as reading  the newspaper or watching television [facility] Adolescent also includes...like school work): 3 Moving or speaking so slowly that other people could have noticed. Or the opposite - being so fidgety or restless that you have been moving around a lot more than usual: 0 Thoughts that you would be better off dead, or of hurting yourself in some way: 0 PHQ-9 Total Score: 9 If you checked off any problems, how difficult have these problems made it for you to do your work, take care of things at home, or get along with other people?: Somewhat difficult  Depression Treatment Depression Interventions/Treatment : Currently on Treatment; Counseling; Medication     Goals Addressed             This Visit's Progress    01/02/2024: To lose weight and to start an exercise regimen.               Objective:    Today's Vitals   01/02/24 0915  Weight: 203 lb (92.1 kg)  PainSc: 10-Worst pain ever  PainLoc: Generalized   Body mass index is 34.84 kg/m.  Hearing/Vision screen Vision Screening - Comments:: Diagnosed with Binocular Diplopia. Immunizations and Health Maintenance Health Maintenance  Topic Date Due   Zoster Vaccines- Shingrix  (1 of 2) Never done   Influenza Vaccine  08/12/2023   COVID-19 Vaccine (4 - 2025-26 season) 09/12/2023   DTaP/Tdap/Td (1 - Tdap) 01/18/2024 (Originally 10/02/1974)   Medicare Annual Wellness (AWV)  01/01/2025   Mammogram  02/07/2025   Colonoscopy   04/05/2030   Pneumococcal Vaccine: 50+ Years  Completed   Bone Density Scan  Completed   Hepatitis C Screening  Completed   Meningococcal B Vaccine  Aged Out        Assessment/Plan:  This is a routine wellness examination for Arvetta.  Patient Care Team: Lonnie Earnest, MD as PCP - General (Family Medicine) McLaurin, Nichole LABOR, RN as [facility] Robinson Idol, MD as Consulting Physician (Ophthalmology) Vernetta Lonni GRADE, MD as Consulting Physician (Orthopedic Surgery) Neysa Reggy BIRCH, MD as Consulting Physician (Pulmonary Disease) Tobie Eldora NOVAK, MD as Consulting Physician (Otolaryngology) Marry Clamp, MD as Resident (Psychiatry)  I have personally reviewed and noted the following in the patients chart:   Medical and social history Use of alcohol , tobacco or illicit drugs  Current medications and supplements including opioid prescriptions. Functional ability and status Nutritional status Physical activity Advanced directives List of other physicians Hospitalizations, surgeries, and ER visits in previous 12 months Vitals Screenings to include cognitive, depression, and falls Referrals and appointments  No orders of the defined types were placed in this encounter.  In addition, I have reviewed and discussed with patient certain preventive protocols, quality metrics, and best practice recommendations. A written personalized care plan for preventive services as well as general preventive health recommendations were provided to patient.   Roz LOISE Fuller, LPN   [id number]   Return in about 1 year (around 01/01/2025) for Medicare wellness.  After Visit Summary: (MyChart) Due to this being a telephonic visit, the after visit summary with patients personalized plan was offered to patient via MyChart   Nurse Notes: Patient aware of current care gaps.  Patient last seen in November 2025. "

## 2024-01-03 ENCOUNTER — Ambulatory Visit: Payer: Self-pay | Admitting: Pulmonary Disease

## 2024-01-23 NOTE — Progress Notes (Signed)
 BH MD Outpatient Progress Note  Televisit via video: I connected with Jodi Gilmore on 1/19 at  9:30 AM EST by a video enabled telemedicine application and verified that I am speaking with the correct person using two identifiers.  Location: Patient: home Provider: office   I discussed the limitations of evaluation and management by telemedicine and the availability of in person appointments. The patient expressed understanding and agreed to proceed.  I discussed the assessment and treatment plan with the patient. The patient was provided an opportunity to ask questions and all were answered. The patient agreed with the plan and demonstrated an understanding of the instructions.   The patient was advised to call back or seek an in-person evaluation if the symptoms worsen or if the condition fails to improve as anticipated.   Jodi Gilmore  MRN:  [id number]  Assessment:  Jodi Gilmore presents for follow-up evaluation. In the prior visit, we continued the cross titration of Paxil  to Prozac .  Today, patient notes completing the Paxil  taper and denies having adverse effects from the taper and is currently on Prozac , Seroquel , BuSpar , and Adderall.  She is noticing more irritability, poor sleep, and less motivation since being off the Paxil .  We discussed increasing the Prozac  to aid with the mood and neurovegetative symptoms.  She does acknowledge that the worsening sleep may also be attributed to her living environment along with medical conditions, discussed the importance of sleep hygiene.  Patient plans to obtain her urine drug screen for the Adderall after she visits her pulmonologist and will schedule a PCP appointment to obtain the UDS.  Patient shows good insight regarding her symptoms and shows good judgment,making future-oriented statements regarding her health and being proactive with a possible employment in the future.  Follow-up in 6 weeks.  Identifying Information: [name] is a 69 y.o. y.o. female with a history of bipolar disorder, GAD, and ADHD who is an established patient with [facility] for management of anxiety and depression.   Plan:  # Bipolar disorder, GAD -- Increase Prozac  to 40 mg daily - Continue Buspirone  10 mg 3 times daily -- Continue Seroquel  400 mg at bedtime  -Labs updated on 07/2023  # ADHD -- Continue Adderall 10 mg BID  - PDMP checked, last fill 12/27  - Plan to obtain UDS from PCP  Patient was given contact information for behavioral health clinic and was instructed to call 911 for emergencies.   Subjective:   Interval History:   Patient reports feeling okay today.   Regarding psychiatric symptoms, she is noticing more irritability and less motivation. She reports now off Paxil , stating she followed the directions of the taper we discussed in the prior appt. She is unsure how much benefit is Prozac  is doing for her but she feels hopeful as she previously was on the medication in the past and did not have any issues. She reports Adderall helping keep her calm.   She is noticing worsening sleep, feeling much of this is due to neck pain, anxiety, and her neighbors playing loud music. Patient reports good appetite. She reports weight gain (40 lbs since 2020) and she feels its multifactorial to less physical activity and also the Seroquel . She denies exercising due to being too off balance and having worsening. She plans on getting a rolling walker and feels excited to be increasing physical activity in that way.   Patient denies current SI, HI, and AVH.   Since  the previous visit, she reports moving houses and is now living in a two bedroom apartment and plans to have a roommate. She reports having an ablation shot on 1/5 and since then has been having neck pain. She has a caregiver who comes twice a week who helps take her to her appointments. 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   Substance use:  Denies tobacco, alcohol , and illicit substance   Past Psychiatric History:  Previous admit 40 years ago is [place]  for severe depression.  Patient denies prior suicide attempts or self-harm.  Paxil  since 2023 Prozac  (did like it back then)  Family Psychiatric History: none pertinent  Social history: Living: alone Occupation: denies Relationship: single Children: 1 child, son who lives in [place]  Past Medical History:  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    Hip pain    HLA B27 (HLA B27 positive)    Hyperlipidemia    Hypertension    Knee pain    Memory changes    Morphea scleroderma    Neuromuscular disorder (HCC)    Raynauds    Osteoporosis    Pneumonia    Psoriatic arthritis (HCC)    seen in ears only   Raynaud's disease    Sjogren syndrome with dental involvement     Past Surgical History:  Procedure Laterality Date   CYST REMOVAL HAND     left arm posterior   laproscopy     SALPINGECTOMY  1980   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   Procedure: LEFT TOTAL HIP ARTHROPLASTY ANTERIOR APPROACH;  Surgeon: Vernetta Lonni GRADE, MD;  Location: [facility];  Service: Orthopedics;  Laterality: Left;   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      Family History:  Family History  Problem Relation Age of Onset   Heart disease Mother    Heart attack Mother    Congestive Heart Failure Mother    Dementia Mother    Heart disease  Father 42   Heart attack Father    Dementia Sister    Hemachromatosis Sister    Parkinson's disease Sister    Allergies Son    Colon cancer Neg Hx    Colon polyps Neg Hx    Esophageal cancer Neg Hx    Stomach cancer Neg Hx    Rectal cancer Neg Hx    BRCA 1/2 Neg Hx    Breast cancer Neg Hx     Social History:  Social History   Socioeconomic History   Marital status: Single    Spouse name: Not on file   Number of children: 1   Years of education: Not on file   Highest education level: Some college, no degree  Occupational History   Occupation:  Unemployed  Tobacco Use   Smoking status: Former    Passive exposure: Past   Smokeless tobacco: Never  Vaping Use   Vaping status: Never Used  Substance and Sexual Activity   Alcohol  use: Not Currently   Drug use: Never   Sexual activity: Not Currently  Other Topics Concern   Not on file  Social History Narrative   Not on file   Social Drivers of Health   Tobacco Use: Medium Risk (01/02/2024)   Patient History    Smoking Tobacco Use: Former    Smokeless Tobacco Use: Never    Passive Exposure: Past  Physicist, Medical Strain: High Risk (01/02/2024)   Overall Financial Resource Strain (CARDIA)    Difficulty of Paying Living Expenses: Very hard  Food Insecurity: Food Insecurity Present (01/02/2024)   Epic    Worried About Programme Researcher, Broadcasting/film/video in the Last Year: Sometimes true    Ran Out of Food in the Last Year: Sometimes true  Transportation Needs: Unmet Transportation Needs (01/02/2024)   Epic    Lack of Transportation (Medical): Yes    Lack of Transportation (Non-Medical): Yes  Physical Activity: Inactive (01/02/2024)   Exercise Vital Sign    Days of Exercise per Week: 0 days    Minutes of Exercise per Session: 0 min  Stress: Stress Concern Present (01/02/2024)   [employer] of Occupational Health - Occupational Stress Questionnaire    Feeling of Stress: Very much  Social Connections: Unknown (01/02/2024)    Social Connection and Isolation Panel    Frequency of Communication with Friends and Family: Once a week    Frequency of Social Gatherings with Friends and Family: Twice a week    Attends Religious Services: Never    Database Administrator or Organizations: No    Attends Banker Meetings: Never    Marital Status: Patient declined  Depression (PHQ2-9): Medium Risk (01/02/2024)   Depression (PHQ2-9)    PHQ-2 Score: 9  Alcohol  Screen: Low Risk (01/02/2024)   Alcohol  Screen    Last Alcohol  Screening Score (AUDIT): 0  Housing: Low Risk (01/02/2024)   Epic    Unable to Pay for Housing in the Last Year: No    Number of Times Moved in the Last Year: 0    Homeless in the Last Year: No  Recent Concern: Housing - High Risk (10/14/2023)   Epic    Unable to Pay for Housing in the Last Year: Yes    Number of Times Moved in the Last Year: 0    Homeless in the Last Year: No  Utilities: Not At Risk (01/02/2024)   Epic    Threatened with loss of utilities: No  Health Literacy: Adequate Health Literacy (01/02/2024)   B1300 Health Literacy    Frequency of need for help with medical instructions: Never    Allergies:  Allergies  Allergen Reactions   Iodinated Contrast Media Anaphylaxis   Prohance [Gadoteridol] Anaphylaxis   Ioversol Other (See Comments)   Latex Hives and Rash   Ptu [Propylthiouracil] Other (See Comments)   Sulfa Antibiotics Hives and Other (See Comments)    Unknown reaction, family history   Tapazole [Methimazole] Other (See Comments)    Current Medications: Current Outpatient Medications  Medication Sig Dispense Refill   alendronate  (FOSAMAX ) 10 MG tablet Take 1 tablet (10 mg total) by mouth daily before breakfast. Take with a full glass of water on an empty stomach. 90 tablet 3   amphetamine -dextroamphetamine  (ADDERALL) 10 MG tablet  Take 1 tablet (10 mg total) by mouth 2 (two) times daily with a meal. 60 tablet 0   amphetamine -dextroamphetamine  (ADDERALL) 10  MG tablet Take 1 tablet (10 mg total) by mouth 2 (two) times daily with a meal. 60 tablet 0   atorvastatin  (LIPITOR) 40 MG tablet Take 1 tablet (40 mg total) by mouth daily. 90 tablet 3   Calcium  Carb-Cholecalciferol  (CALCIUM  1000 + D) 1000-20 MG-MCG TABS Take 1 tablet by mouth daily. 30 tablet 2   cephALEXin  (KEFLEX ) 500 MG capsule Take 1 capsule (500 mg total) by mouth 2 (two) times daily. (Patient not taking: Reported on 12/22/2023) 14 capsule 0   cetirizine  (ZYRTEC  ALLERGY) 10 MG tablet Take 1 tablet (10 mg total) by mouth daily. 90 tablet 1   clotrimazole  (LOTRIMIN ) 1 % cream Apply 1 Application topically 2 (two) times daily. To right underarm 30 g 0   colchicine  0.6 MG tablet Take 1 tablet (0.6 mg total) by mouth daily. 90 tablet 3   cycloSPORINE  (RESTASIS ) 0.05 % ophthalmic emulsion INSTILL ONE DROP INTO EACH EYE TWICE DAILY 60 each 3   diclofenac  Sodium (VOLTAREN ) 1 % GEL Apply 2 g topically 4 (four) times daily. (Patient taking differently: Apply 2 g topically 4 (four) times daily. PRN) 20 g 1   FLUoxetine  (PROZAC ) 20 MG capsule TAKE ONE CAPSULE BY MOUTH ONE TIME DAILY 30 capsule 1   fluticasone  (FLONASE ) 50 MCG/ACT nasal spray USE 2 SPRAYS IN EACH NOSTRIL ONCE TIME DAILY 16 mL 6   levothyroxine  (SYNTHROID ) 175 MCG tablet TAKE ONE TABLET BY MOUTH EVERY MORNING BEFORE BREAKFAST 90 tablet 1   lidocaine  (XYLOCAINE ) 2 % solution Swish and spit 15mL no more than every 4 hours for oral ulcer pain. 100 mL 3   lisinopril  (ZESTRIL ) 2.5 MG tablet TAKE ONE TABLET BY MOUTH AT BEDTIME 90 tablet 1   methocarbamol  (ROBAXIN ) 500 MG tablet TAKE ONE TABLET BY MOUTH EVERY 8 HOURS AS NEEDED FOR MUSCLE SPASMS 20 tablet 0   omeprazole  (PRILOSEC) 40 MG capsule TAKE ONE CAPSULE BY MOUTH IN THE MORNING AND TAKE ONE CAPSULE BY MOUTH AT BEDTIME 180 capsule 3   ondansetron  (ZOFRAN ) 4 MG tablet TAKE ONE TABLET BY MOUTH EVERY 8 HOURS AS NEEDED FOR NAUSEA AND/OR VOMITING 20 tablet 0   OXYGEN Inhale 5 L into the lungs as  needed (as needed with exceration).     PARoxetine  (PAXIL ) 20 MG tablet Take 1 tablet (20 mg total) by mouth daily for 7 days, THEN 0.5 tablets (10 mg total) daily for 14 days. (Patient not taking: No sig reported) 14 tablet 0   QUEtiapine  (SEROQUEL ) 400 MG tablet Take 1 tablet (400 mg total) by mouth at bedtime. 42 tablet 0   rizatriptan  (MAXALT ) 10 MG tablet Take 1 tablet (10 mg total) by mouth as needed for migraine. May repeat in 2 hours if needed. Do not take more than THREE doses in one day. Do not use more than 10 days per month. 30 tablet 2   rizatriptan  (MAXALT -MLT) 10 MG disintegrating tablet TAKE 1 TABLET (10 MG TOTAL) BY MOUTH AS NEEDED FOR MIGRAINE. MAY REPEAT IN 2 HOURS IF NEEDED 10 tablet 0   valACYclovir  (VALTREX ) 500 MG tablet TAKE ONE TABLET BY MOUTH TWICE A DAY FOR 7 DAYS 30 tablet 2   No current facility-administered medications for this visit.     Objective: Psychiatric Specialty Exam: General Appearance: appears at stated age, casually dressed and groomed   Behavior: pleasant and cooperative  Psychomotor Activity: no psychomotor agitation or retardation noted   Eye Contact: fair  Speech: normal amount, volume and fluency    Mood: euthymic  Affect: congruent, pleasant and interactive   Thought Process: linear, goal directed, no circumstantial or tangential thought process noted, no racing thoughts or flight of ideas  Descriptions of Associations: intact   Thought Content Hallucinations: denies AH, VH , does not appear responding to stimuli  Delusions: no paranoia, delusions of control, grandeur, ideas of reference, thought broadcasting, and magical thinking  Suicidal Thoughts: denies SI, intention, plan  Homicidal Thoughts: denies HI, intention, plan   Alertness/Orientation: alert and fully oriented   Insight: fair Judgment: fair  Memory: intact   Executive Functions  Concentration: intact  Attention Span: fair  Recall: intact  Fund of Knowledge:  fair   Physical Exam  General: Pleasant, well-appearing. No acute distress. Pulmonary: Normal effort. No wheezing or rales. Skin: No obvious rash or lesions. Neuro: A&Ox3.No focal deficit.  Review of Systems  Neck pain   Metabolic Disorder Labs: Lab Results  Component Value Date   HGBA1C 5.3 08/10/2023   No results found for: PROLACTIN Lab Results  Component Value Date   CHOL 167 05/24/2023   TRIG 232 (H) 05/24/2023   HDL 48 05/24/2023   CHOLHDL 3.5 05/24/2023   LDLCALC 81 05/24/2023   LDLCALC 175 (H) 02/12/2020   Lab Results  Component Value Date   TSH 1.220 11/01/2023   TSH 13.000 (H) 08/10/2023    Therapeutic Level Labs: No results found for: LITHIUM No results found for: VALPROATE No results found for: CBMZ  Screenings: GAD-7    Flowsheet Row Patient Outreach Telephone from 12/22/2023 in [place] POPULATION [facility] DEPARTMENT Patient Outreach Telephone from 11/17/2023 in [place] POPULATION HEALTH DEPARTMENT Patient Outreach Telephone from 10/14/2023 in [place] [facility] Video Visit from 08/26/2021 in [facility] Video Visit from 06/24/2021 in [facility]  Total GAD-7 Score 3 6 6 9 10    PHQ2-9    Flowsheet Row Clinical Support from 01/02/2024 in [facility] [place]. [facility] Patient Outreach Telephone from 12/22/2023 in [place] POPULATION HEALTH DEPARTMENT Patient Outreach Telephone from 11/17/2023 in [place] POPULATION HEALTH DEPARTMENT Office Visit from 11/01/2023 in [facility] Of [place]. [facility] Patient Outreach Telephone from 10/14/2023 in [place] POPULATION HEALTH DEPARTMENT  PHQ-2 Total Score 2 2 2 4  0  PHQ-9 Total Score 9 9 7 14  --   Flowsheet Row UC from 12/11/2022 in [facility] Video Visit from 08/26/2021 in [facility] Video Visit from 06/24/2021 in [facility]  C-SSRS RISK CATEGORY No Risk Low Risk Low Risk    [name], MD PGY-3 Psychiatry Resident

## 2024-01-24 ENCOUNTER — Encounter: Payer: Self-pay | Admitting: Pulmonary Disease

## 2024-01-26 ENCOUNTER — Other Ambulatory Visit: Payer: Self-pay | Admitting: Family Medicine

## 2024-01-26 DIAGNOSIS — R21 Rash and other nonspecific skin eruption: Secondary | ICD-10-CM

## 2024-01-29 ENCOUNTER — Encounter: Payer: Self-pay | Admitting: Family Medicine

## 2024-01-30 ENCOUNTER — Telehealth (HOSPITAL_COMMUNITY): Admitting: Psychiatry

## 2024-01-30 DIAGNOSIS — F909 Attention-deficit hyperactivity disorder, unspecified type: Secondary | ICD-10-CM | POA: Diagnosis not present

## 2024-01-30 DIAGNOSIS — F3160 Bipolar disorder, current episode mixed, unspecified: Secondary | ICD-10-CM | POA: Diagnosis not present

## 2024-01-30 DIAGNOSIS — F411 Generalized anxiety disorder: Secondary | ICD-10-CM

## 2024-01-30 MED ORDER — AMPHETAMINE-DEXTROAMPHETAMINE 10 MG PO TABS: 10.0000 mg | ORAL_TABLET | Freq: Two times a day (BID) | ORAL | 0 refills | Status: AC

## 2024-01-30 MED ORDER — FLUOXETINE HCL 40 MG PO CAPS: 40.0000 mg | ORAL_CAPSULE | Freq: Every day | ORAL | 0 refills | Status: AC

## 2024-01-30 MED ORDER — BUSPIRONE HCL 10 MG PO TABS: 10.0000 mg | ORAL_TABLET | Freq: Three times a day (TID) | ORAL | 0 refills | Status: AC

## 2024-01-30 MED ORDER — QUETIAPINE FUMARATE 400 MG PO TABS: 400.0000 mg | ORAL_TABLET | Freq: Every day | ORAL | 0 refills | Status: AC

## 2024-01-31 NOTE — Addendum Note (Signed)
 Addended by: CARVIN CROCK on: 01/31/2024 11:39 AM   Modules accepted: Level of Service

## 2024-02-01 ENCOUNTER — Encounter

## 2024-02-01 ENCOUNTER — Ambulatory Visit: Admitting: Pulmonary Disease

## 2024-02-02 ENCOUNTER — Telehealth: Payer: Self-pay | Admitting: *Deleted

## 2024-02-03 NOTE — Telephone Encounter (Signed)
 Patient calls nurse line in regards to request.   She reports she believes the daily Fosamax  is making her nauseated and she reports more headaches.   She would like to try the once a week Fosamax  to see if her symptoms improve.   She reports she will be scheduling an apt soon. She reports several more tests she needs to do first.   Advised will forward to PCP.

## 2024-02-04 ENCOUNTER — Other Ambulatory Visit: Payer: Self-pay | Admitting: Family Medicine

## 2024-02-04 DIAGNOSIS — G43109 Migraine with aura, not intractable, without status migrainosus: Secondary | ICD-10-CM

## 2024-02-08 ENCOUNTER — Encounter: Payer: Self-pay | Admitting: Pulmonary Disease

## 2024-02-08 ENCOUNTER — Encounter

## 2024-02-08 ENCOUNTER — Ambulatory Visit: Admitting: Pulmonary Disease

## 2024-02-09 ENCOUNTER — Telehealth: Payer: Self-pay

## 2024-02-09 NOTE — Telephone Encounter (Signed)
 Patient calls nurse line voicing frustration over Fosamax  request.   She reports she has sent several mychart messages requesting to change to the once weekly dosing.   I have pended the requested medication.  Patient requests a followup visit to discuss labs with PCP. She reports she has concerns over b12 and CBC results. She denies taking b12 supplements at this time.  Patient scheduled with PCP for 2/16.

## 2024-02-10 ENCOUNTER — Encounter: Payer: Self-pay | Admitting: Family Medicine

## 2024-02-10 ENCOUNTER — Telehealth: Payer: Self-pay

## 2024-02-10 NOTE — Telephone Encounter (Signed)
 Front staff, please advise wait list for pt.

## 2024-02-27 ENCOUNTER — Ambulatory Visit: Payer: Self-pay | Admitting: Family Medicine

## 2024-02-29 ENCOUNTER — Ambulatory Visit: Admitting: Pulmonary Disease

## 2024-03-05 ENCOUNTER — Ambulatory Visit: Admitting: Neurology

## 2024-03-12 ENCOUNTER — Telehealth (HOSPITAL_COMMUNITY): Admitting: Psychiatry

## 2024-03-20 ENCOUNTER — Telehealth: Payer: Self-pay | Admitting: *Deleted

## 2024-04-25 ENCOUNTER — Encounter

## 2024-04-25 ENCOUNTER — Ambulatory Visit: Admitting: Pulmonary Disease
